# Patient Record
Sex: Male | Born: 1935 | Race: White | Hispanic: No | Marital: Married | Smoking: Former smoker
Health system: Southern US, Community
[De-identification: ages and names within clinical notes are randomized; demographics above are authoritative.]

## PROBLEM LIST (undated history)

## (undated) DIAGNOSIS — R269 Unspecified abnormalities of gait and mobility: Secondary | ICD-10-CM

## (undated) DIAGNOSIS — G8929 Other chronic pain: Secondary | ICD-10-CM

## (undated) DIAGNOSIS — N289 Disorder of kidney and ureter, unspecified: Secondary | ICD-10-CM

## (undated) DIAGNOSIS — C44310 Basal cell carcinoma of skin of unspecified parts of face: Secondary | ICD-10-CM

## (undated) DIAGNOSIS — J189 Pneumonia, unspecified organism: Secondary | ICD-10-CM

## (undated) DIAGNOSIS — R011 Cardiac murmur, unspecified: Secondary | ICD-10-CM

## (undated) DIAGNOSIS — I2510 Atherosclerotic heart disease of native coronary artery without angina pectoris: Secondary | ICD-10-CM

## (undated) DIAGNOSIS — K56609 Unspecified intestinal obstruction, unspecified as to partial versus complete obstruction: Secondary | ICD-10-CM

## (undated) DIAGNOSIS — J8410 Pulmonary fibrosis, unspecified: Secondary | ICD-10-CM

## (undated) DIAGNOSIS — I82409 Acute embolism and thrombosis of unspecified deep veins of unspecified lower extremity: Secondary | ICD-10-CM

## (undated) DIAGNOSIS — E119 Type 2 diabetes mellitus without complications: Secondary | ICD-10-CM

## (undated) DIAGNOSIS — I219 Acute myocardial infarction, unspecified: Secondary | ICD-10-CM

## (undated) DIAGNOSIS — R931 Abnormal findings on diagnostic imaging of heart and coronary circulation: Secondary | ICD-10-CM

## (undated) DIAGNOSIS — Z86718 Personal history of other venous thrombosis and embolism: Secondary | ICD-10-CM

## (undated) DIAGNOSIS — E7800 Pure hypercholesterolemia, unspecified: Secondary | ICD-10-CM

## (undated) DIAGNOSIS — I10 Essential (primary) hypertension: Secondary | ICD-10-CM

## (undated) DIAGNOSIS — I714 Abdominal aortic aneurysm, without rupture, unspecified: Secondary | ICD-10-CM

## (undated) DIAGNOSIS — I499 Cardiac arrhythmia, unspecified: Secondary | ICD-10-CM

## (undated) DIAGNOSIS — Q602 Renal agenesis, unspecified: Secondary | ICD-10-CM

## (undated) DIAGNOSIS — E785 Hyperlipidemia, unspecified: Secondary | ICD-10-CM

## (undated) DIAGNOSIS — G609 Hereditary and idiopathic neuropathy, unspecified: Secondary | ICD-10-CM

## (undated) DIAGNOSIS — D649 Anemia, unspecified: Secondary | ICD-10-CM

## (undated) DIAGNOSIS — M1990 Unspecified osteoarthritis, unspecified site: Secondary | ICD-10-CM

## (undated) DIAGNOSIS — Q605 Renal hypoplasia, unspecified: Secondary | ICD-10-CM

## (undated) DIAGNOSIS — I2699 Other pulmonary embolism without acute cor pulmonale: Secondary | ICD-10-CM

## (undated) DIAGNOSIS — M545 Low back pain, unspecified: Secondary | ICD-10-CM

## (undated) DIAGNOSIS — N189 Chronic kidney disease, unspecified: Secondary | ICD-10-CM

## (undated) DIAGNOSIS — E1140 Type 2 diabetes mellitus with diabetic neuropathy, unspecified: Secondary | ICD-10-CM

## (undated) DIAGNOSIS — M109 Gout, unspecified: Secondary | ICD-10-CM

## (undated) DIAGNOSIS — I7100 Dissection of unspecified site of aorta: Secondary | ICD-10-CM

## (undated) DIAGNOSIS — G4730 Sleep apnea, unspecified: Secondary | ICD-10-CM

## (undated) DIAGNOSIS — G4733 Obstructive sleep apnea (adult) (pediatric): Secondary | ICD-10-CM

## (undated) HISTORY — PX: TONSILLECTOMY: SUR1361

## (undated) HISTORY — PX: JOINT REPLACEMENT: SHX530

## (undated) HISTORY — DX: Personal history of other venous thrombosis and embolism: Z86.718

## (undated) HISTORY — PX: ABDOMINAL AORTIC ANEURYSM REPAIR: SHX42

## (undated) HISTORY — PX: COLONOSCOPY: SHX174

## (undated) HISTORY — DX: Atherosclerotic heart disease of native coronary artery without angina pectoris: I25.10

## (undated) HISTORY — PX: APPENDECTOMY: SHX54

## (undated) HISTORY — DX: Pure hypercholesterolemia, unspecified: E78.00

## (undated) HISTORY — DX: Cardiac arrhythmia, unspecified: I49.9

## (undated) HISTORY — DX: Pulmonary fibrosis, unspecified: J84.10

## (undated) HISTORY — DX: Abnormal findings on diagnostic imaging of heart and coronary circulation: R93.1

## (undated) HISTORY — DX: Gout, unspecified: M10.9

## (undated) HISTORY — DX: Unspecified osteoarthritis, unspecified site: M19.90

## (undated) HISTORY — DX: Essential (primary) hypertension: I10

## (undated) HISTORY — DX: Dissection of unspecified site of aorta: I71.00

## (undated) HISTORY — DX: Type 2 diabetes mellitus without complications: E11.9

## (undated) HISTORY — DX: Hereditary and idiopathic neuropathy, unspecified: G60.9

## (undated) HISTORY — DX: Obstructive sleep apnea (adult) (pediatric): G47.33

## (undated) HISTORY — DX: Abdominal aortic aneurysm, without rupture: I71.4

## (undated) HISTORY — DX: Unspecified abnormalities of gait and mobility: R26.9

## (undated) HISTORY — DX: Abdominal aortic aneurysm, without rupture, unspecified: I71.40

## (undated) HISTORY — DX: Renal hypoplasia, unspecified: Q60.5

## (undated) HISTORY — DX: Chronic kidney disease, unspecified: N18.9

## (undated) HISTORY — DX: Type 2 diabetes mellitus with diabetic neuropathy, unspecified: E11.40

## (undated) HISTORY — DX: Pneumonia, unspecified organism: J18.9

## (undated) HISTORY — DX: Renal agenesis, unspecified: Q60.2

## (undated) HISTORY — DX: Disorder of kidney and ureter, unspecified: N28.9

## (undated) HISTORY — PX: CATARACT EXTRACTION W/ INTRAOCULAR LENS  IMPLANT, BILATERAL: SHX1307

## (undated) HISTORY — DX: Hyperlipidemia, unspecified: E78.5

## (undated) HISTORY — DX: Sleep apnea, unspecified: G47.30

---
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
## 1983-03-07 HISTORY — PX: INGUINAL HERNIA REPAIR: SUR1180

## 1985-03-06 DIAGNOSIS — I219 Acute myocardial infarction, unspecified: Secondary | ICD-10-CM

## 1985-03-06 HISTORY — DX: Acute myocardial infarction, unspecified: I21.9

## 1985-03-06 HISTORY — PX: CARDIAC CATHETERIZATION: SHX172

## 1986-01-04 HISTORY — PX: CORONARY ARTERY BYPASS GRAFT: SHX141

## 1989-05-04 HISTORY — PX: TOTAL KNEE ARTHROPLASTY: SHX125

## 1994-03-06 HISTORY — PX: NEPHRECTOMY: SHX65

## 1997-07-21 ENCOUNTER — Other Ambulatory Visit: Admission: RE | Admit: 1997-07-21 | Discharge: 1997-07-21 | Payer: Self-pay | Admitting: *Deleted

## 1997-07-22 ENCOUNTER — Ambulatory Visit (HOSPITAL_COMMUNITY): Admission: RE | Admit: 1997-07-22 | Discharge: 1997-07-22 | Payer: Self-pay | Admitting: *Deleted

## 1998-08-19 ENCOUNTER — Ambulatory Visit (HOSPITAL_COMMUNITY): Admission: RE | Admit: 1998-08-19 | Discharge: 1998-08-19 | Payer: Self-pay | Admitting: *Deleted

## 1999-07-28 ENCOUNTER — Ambulatory Visit: Admission: RE | Admit: 1999-07-28 | Discharge: 1999-07-28 | Payer: Self-pay | Admitting: Internal Medicine

## 1999-07-28 ENCOUNTER — Encounter: Payer: Self-pay | Admitting: Internal Medicine

## 1999-08-26 ENCOUNTER — Encounter: Payer: Self-pay | Admitting: Internal Medicine

## 1999-08-26 ENCOUNTER — Ambulatory Visit (HOSPITAL_COMMUNITY): Admission: RE | Admit: 1999-08-26 | Discharge: 1999-08-26 | Payer: Self-pay | Admitting: Internal Medicine

## 2003-12-20 ENCOUNTER — Ambulatory Visit (HOSPITAL_BASED_OUTPATIENT_CLINIC_OR_DEPARTMENT_OTHER): Admission: RE | Admit: 2003-12-20 | Discharge: 2003-12-20 | Payer: Self-pay | Admitting: Internal Medicine

## 2004-01-08 ENCOUNTER — Encounter: Payer: Self-pay | Admitting: Internal Medicine

## 2004-01-08 ENCOUNTER — Ambulatory Visit (HOSPITAL_COMMUNITY): Admission: RE | Admit: 2004-01-08 | Discharge: 2004-01-08 | Payer: Self-pay | Admitting: Gastroenterology

## 2004-02-10 ENCOUNTER — Encounter: Admission: RE | Admit: 2004-02-10 | Discharge: 2004-02-10 | Payer: Self-pay | Admitting: Internal Medicine

## 2004-03-29 ENCOUNTER — Ambulatory Visit: Payer: Self-pay | Admitting: Internal Medicine

## 2004-04-28 ENCOUNTER — Ambulatory Visit (HOSPITAL_COMMUNITY): Admission: RE | Admit: 2004-04-28 | Discharge: 2004-04-28 | Payer: Self-pay | Admitting: Nephrology

## 2004-05-09 ENCOUNTER — Ambulatory Visit: Payer: Self-pay | Admitting: Internal Medicine

## 2004-09-01 ENCOUNTER — Ambulatory Visit: Payer: Self-pay | Admitting: Internal Medicine

## 2004-09-19 ENCOUNTER — Encounter: Payer: Self-pay | Admitting: Internal Medicine

## 2005-05-04 HISTORY — PX: OTHER SURGICAL HISTORY: SHX169

## 2005-05-05 ENCOUNTER — Encounter: Admission: RE | Admit: 2005-05-05 | Discharge: 2005-05-05 | Payer: Self-pay | Admitting: Orthopedic Surgery

## 2005-05-09 ENCOUNTER — Ambulatory Visit (HOSPITAL_BASED_OUTPATIENT_CLINIC_OR_DEPARTMENT_OTHER): Admission: RE | Admit: 2005-05-09 | Discharge: 2005-05-10 | Payer: Self-pay | Admitting: Orthopedic Surgery

## 2005-05-10 ENCOUNTER — Encounter (INDEPENDENT_AMBULATORY_CARE_PROVIDER_SITE_OTHER): Payer: Self-pay | Admitting: Specialist

## 2005-05-31 ENCOUNTER — Encounter: Admission: RE | Admit: 2005-05-31 | Discharge: 2005-05-31 | Payer: Self-pay | Admitting: Internal Medicine

## 2005-08-08 ENCOUNTER — Encounter: Admission: RE | Admit: 2005-08-08 | Discharge: 2005-08-08 | Payer: Self-pay | Admitting: Internal Medicine

## 2006-09-05 ENCOUNTER — Ambulatory Visit: Payer: Self-pay | Admitting: Internal Medicine

## 2006-09-05 DIAGNOSIS — Z9889 Other specified postprocedural states: Secondary | ICD-10-CM

## 2006-09-05 DIAGNOSIS — I2510 Atherosclerotic heart disease of native coronary artery without angina pectoris: Secondary | ICD-10-CM | POA: Insufficient documentation

## 2006-09-05 DIAGNOSIS — E119 Type 2 diabetes mellitus without complications: Secondary | ICD-10-CM

## 2006-09-05 DIAGNOSIS — G4730 Sleep apnea, unspecified: Secondary | ICD-10-CM

## 2006-09-05 DIAGNOSIS — N183 Chronic kidney disease, stage 3 unspecified: Secondary | ICD-10-CM | POA: Insufficient documentation

## 2006-09-05 DIAGNOSIS — I10 Essential (primary) hypertension: Secondary | ICD-10-CM

## 2006-09-05 DIAGNOSIS — Q605 Renal hypoplasia, unspecified: Secondary | ICD-10-CM

## 2006-09-05 DIAGNOSIS — I110 Hypertensive heart disease with heart failure: Secondary | ICD-10-CM

## 2006-09-05 DIAGNOSIS — G609 Hereditary and idiopathic neuropathy, unspecified: Secondary | ICD-10-CM

## 2006-09-05 DIAGNOSIS — N189 Chronic kidney disease, unspecified: Secondary | ICD-10-CM

## 2006-09-05 DIAGNOSIS — Q602 Renal agenesis, unspecified: Secondary | ICD-10-CM | POA: Insufficient documentation

## 2006-09-05 HISTORY — DX: Hereditary and idiopathic neuropathy, unspecified: G60.9

## 2006-09-05 HISTORY — DX: Essential (primary) hypertension: I10

## 2006-09-05 HISTORY — DX: Type 2 diabetes mellitus without complications: E11.9

## 2006-09-05 HISTORY — DX: Sleep apnea, unspecified: G47.30

## 2006-09-05 HISTORY — DX: Renal agenesis, unspecified: Q60.2

## 2006-09-05 HISTORY — DX: Chronic kidney disease, unspecified: N18.9

## 2006-09-13 ENCOUNTER — Ambulatory Visit: Payer: Self-pay | Admitting: Internal Medicine

## 2006-09-17 LAB — CONVERTED CEMR LAB
AST: 23 units/L (ref 0–37)
BUN: 19 mg/dL (ref 6–23)
Basophils Absolute: 0 10*3/uL (ref 0.0–0.1)
CO2: 29 meq/L (ref 19–32)
Calcium: 10 mg/dL (ref 8.4–10.5)
Chloride: 104 meq/L (ref 96–112)
Cholesterol: 146 mg/dL (ref 0–200)
Eosinophils Relative: 4.3 % (ref 0.0–5.0)
GFR calc non Af Amer: 52 mL/min
Glucose, Bld: 102 mg/dL — ABNORMAL HIGH (ref 70–99)
LDL Cholesterol: 85 mg/dL (ref 0–99)
MCHC: 35.3 g/dL (ref 30.0–36.0)
Microalb Creat Ratio: 4.9 mg/g (ref 0.0–30.0)
Neutro Abs: 4.4 10*3/uL (ref 1.4–7.7)
PSA: 1.04 ng/mL (ref 0.10–4.00)
Platelets: 201 10*3/uL (ref 150–400)
Potassium: 4 meq/L (ref 3.5–5.1)
Total CHOL/HDL Ratio: 3.8
Triglycerides: 111 mg/dL (ref 0–149)

## 2006-12-12 ENCOUNTER — Encounter: Payer: Self-pay | Admitting: Internal Medicine

## 2006-12-25 ENCOUNTER — Ambulatory Visit: Payer: Self-pay | Admitting: Internal Medicine

## 2007-01-03 DIAGNOSIS — G4733 Obstructive sleep apnea (adult) (pediatric): Secondary | ICD-10-CM | POA: Insufficient documentation

## 2007-01-03 LAB — CONVERTED CEMR LAB
BUN: 20 mg/dL (ref 6–23)
CO2: 31 meq/L (ref 19–32)
Calcium: 9.9 mg/dL (ref 8.4–10.5)
Chloride: 108 meq/L (ref 96–112)
GFR calc Af Amer: 76 mL/min
GFR calc non Af Amer: 62 mL/min
Glucose, Bld: 112 mg/dL — ABNORMAL HIGH (ref 70–99)
Sodium: 145 meq/L (ref 135–145)
TSH: 1.51 microintl units/mL (ref 0.35–5.50)

## 2007-01-09 ENCOUNTER — Ambulatory Visit: Payer: Self-pay

## 2007-01-09 ENCOUNTER — Encounter: Payer: Self-pay | Admitting: Internal Medicine

## 2007-01-14 ENCOUNTER — Encounter: Payer: Self-pay | Admitting: Internal Medicine

## 2007-01-14 HISTORY — PX: US ECHOCARDIOGRAPHY: HXRAD669

## 2007-03-26 ENCOUNTER — Telehealth (INDEPENDENT_AMBULATORY_CARE_PROVIDER_SITE_OTHER): Payer: Self-pay | Admitting: *Deleted

## 2007-04-23 ENCOUNTER — Ambulatory Visit: Payer: Self-pay | Admitting: Internal Medicine

## 2007-04-23 DIAGNOSIS — M1990 Unspecified osteoarthritis, unspecified site: Secondary | ICD-10-CM | POA: Insufficient documentation

## 2007-04-24 ENCOUNTER — Encounter (INDEPENDENT_AMBULATORY_CARE_PROVIDER_SITE_OTHER): Payer: Self-pay | Admitting: *Deleted

## 2007-04-24 LAB — CONVERTED CEMR LAB
GFR calc non Af Amer: 57 mL/min
Potassium: 4.3 meq/L (ref 3.5–5.1)

## 2007-04-25 ENCOUNTER — Encounter: Payer: Self-pay | Admitting: Internal Medicine

## 2007-04-29 ENCOUNTER — Encounter: Admission: RE | Admit: 2007-04-29 | Discharge: 2007-04-29 | Payer: Self-pay | Admitting: Orthopedic Surgery

## 2007-04-30 ENCOUNTER — Ambulatory Visit (HOSPITAL_BASED_OUTPATIENT_CLINIC_OR_DEPARTMENT_OTHER): Admission: RE | Admit: 2007-04-30 | Discharge: 2007-04-30 | Payer: Self-pay | Admitting: Orthopedic Surgery

## 2007-05-10 ENCOUNTER — Ambulatory Visit: Payer: Self-pay | Admitting: Internal Medicine

## 2007-05-10 ENCOUNTER — Encounter: Payer: Self-pay | Admitting: Internal Medicine

## 2007-06-30 ENCOUNTER — Encounter (INDEPENDENT_AMBULATORY_CARE_PROVIDER_SITE_OTHER): Payer: Self-pay | Admitting: *Deleted

## 2007-08-22 ENCOUNTER — Ambulatory Visit: Payer: Self-pay | Admitting: Internal Medicine

## 2007-08-22 DIAGNOSIS — M109 Gout, unspecified: Secondary | ICD-10-CM

## 2007-08-22 DIAGNOSIS — R269 Unspecified abnormalities of gait and mobility: Secondary | ICD-10-CM

## 2007-08-22 HISTORY — DX: Gout, unspecified: M10.9

## 2007-08-22 HISTORY — DX: Unspecified abnormalities of gait and mobility: R26.9

## 2007-08-28 LAB — CONVERTED CEMR LAB
ALT: 16 units/L (ref 0–53)
AST: 26 units/L (ref 0–37)
Albumin: 3.9 g/dL (ref 3.5–5.2)
Cholesterol: 150 mg/dL (ref 0–200)
Triglycerides: 196 mg/dL — ABNORMAL HIGH (ref 0–149)
VLDL: 39 mg/dL (ref 0–40)

## 2007-09-02 ENCOUNTER — Encounter: Admission: RE | Admit: 2007-09-02 | Discharge: 2007-09-02 | Payer: Self-pay | Admitting: Internal Medicine

## 2007-09-02 ENCOUNTER — Encounter: Payer: Self-pay | Admitting: Internal Medicine

## 2007-09-03 ENCOUNTER — Telehealth (INDEPENDENT_AMBULATORY_CARE_PROVIDER_SITE_OTHER): Payer: Self-pay | Admitting: *Deleted

## 2007-11-25 ENCOUNTER — Encounter: Payer: Self-pay | Admitting: Internal Medicine

## 2007-11-25 LAB — CONVERTED CEMR LAB
Chloride, Serum: 103 mmol/L
Creatinine, Ser: 1.2 mg/dL
Glucose, Bld: 97 mg/dL

## 2007-11-27 ENCOUNTER — Encounter: Payer: Self-pay | Admitting: Internal Medicine

## 2007-12-09 ENCOUNTER — Ambulatory Visit: Payer: Self-pay | Admitting: Internal Medicine

## 2007-12-11 ENCOUNTER — Encounter: Payer: Self-pay | Admitting: Internal Medicine

## 2007-12-11 ENCOUNTER — Encounter (INDEPENDENT_AMBULATORY_CARE_PROVIDER_SITE_OTHER): Payer: Self-pay | Admitting: *Deleted

## 2007-12-26 ENCOUNTER — Telehealth (INDEPENDENT_AMBULATORY_CARE_PROVIDER_SITE_OTHER): Payer: Self-pay | Admitting: *Deleted

## 2007-12-26 ENCOUNTER — Ambulatory Visit: Payer: Self-pay | Admitting: Internal Medicine

## 2007-12-26 LAB — CONVERTED CEMR LAB
ALT: 17 units/L
Albumin: 4.3 g/dL
Total Bilirubin: 0.5 mg/dL
Total Protein: 7.3 g/dL
Triglyceride fasting, serum: 274 mg/dL

## 2008-01-01 ENCOUNTER — Encounter (INDEPENDENT_AMBULATORY_CARE_PROVIDER_SITE_OTHER): Payer: Self-pay | Admitting: *Deleted

## 2008-01-01 LAB — CONVERTED CEMR LAB: Hgb A1c MFr Bld: 6.1 % — ABNORMAL HIGH (ref 4.6–6.0)

## 2008-01-14 ENCOUNTER — Ambulatory Visit: Payer: Self-pay

## 2008-01-14 ENCOUNTER — Encounter: Payer: Self-pay | Admitting: Internal Medicine

## 2008-01-22 ENCOUNTER — Telehealth (INDEPENDENT_AMBULATORY_CARE_PROVIDER_SITE_OTHER): Payer: Self-pay | Admitting: *Deleted

## 2008-03-06 LAB — HM COLONOSCOPY: HM COLON: NORMAL

## 2008-03-25 ENCOUNTER — Encounter: Payer: Self-pay | Admitting: Internal Medicine

## 2008-03-26 ENCOUNTER — Encounter: Payer: Self-pay | Admitting: Internal Medicine

## 2008-04-16 ENCOUNTER — Telehealth: Payer: Self-pay | Admitting: Internal Medicine

## 2008-04-16 ENCOUNTER — Ambulatory Visit: Payer: Self-pay | Admitting: Internal Medicine

## 2008-04-16 DIAGNOSIS — L989 Disorder of the skin and subcutaneous tissue, unspecified: Secondary | ICD-10-CM | POA: Insufficient documentation

## 2008-04-20 ENCOUNTER — Telehealth (INDEPENDENT_AMBULATORY_CARE_PROVIDER_SITE_OTHER): Payer: Self-pay | Admitting: *Deleted

## 2008-04-20 LAB — CONVERTED CEMR LAB: PSA: 2.35 ng/mL (ref 0.10–4.00)

## 2008-08-18 ENCOUNTER — Ambulatory Visit: Payer: Self-pay | Admitting: Internal Medicine

## 2008-08-26 LAB — CONVERTED CEMR LAB
CO2: 27 meq/L (ref 19–32)
Calcium: 9.7 mg/dL (ref 8.4–10.5)
Chloride: 100 meq/L (ref 96–112)
Creatinine, Ser: 1.4 mg/dL (ref 0.4–1.5)
Glucose, Bld: 94 mg/dL (ref 70–99)
Hemoglobin: 13.1 g/dL (ref 13.0–17.0)
Hgb A1c MFr Bld: 6.4 % (ref 4.6–6.5)
Lymphs Abs: 1.4 10*3/uL (ref 0.7–4.0)
MCHC: 34.2 g/dL (ref 30.0–36.0)
MCV: 98.5 fL (ref 78.0–100.0)
Monocytes Relative: 3 % (ref 3.0–12.0)
Platelets: 185 10*3/uL (ref 150.0–400.0)
Potassium: 4.6 meq/L (ref 3.5–5.1)
RBC: 3.88 M/uL — ABNORMAL LOW (ref 4.22–5.81)

## 2008-08-28 ENCOUNTER — Telehealth: Payer: Self-pay | Admitting: Internal Medicine

## 2008-08-31 ENCOUNTER — Ambulatory Visit: Payer: Self-pay | Admitting: Internal Medicine

## 2008-09-01 ENCOUNTER — Telehealth: Payer: Self-pay | Admitting: Internal Medicine

## 2008-09-29 ENCOUNTER — Encounter: Payer: Self-pay | Admitting: Internal Medicine

## 2008-09-30 ENCOUNTER — Telehealth: Payer: Self-pay | Admitting: Internal Medicine

## 2008-10-05 ENCOUNTER — Ambulatory Visit: Payer: Self-pay | Admitting: Internal Medicine

## 2008-10-07 ENCOUNTER — Telehealth: Payer: Self-pay | Admitting: Internal Medicine

## 2008-10-12 ENCOUNTER — Telehealth: Payer: Self-pay | Admitting: Internal Medicine

## 2008-10-16 ENCOUNTER — Telehealth (INDEPENDENT_AMBULATORY_CARE_PROVIDER_SITE_OTHER): Payer: Self-pay | Admitting: *Deleted

## 2008-10-17 ENCOUNTER — Ambulatory Visit: Payer: Self-pay | Admitting: Diagnostic Radiology

## 2008-10-17 ENCOUNTER — Emergency Department (HOSPITAL_BASED_OUTPATIENT_CLINIC_OR_DEPARTMENT_OTHER): Admission: EM | Admit: 2008-10-17 | Discharge: 2008-10-17 | Payer: Self-pay | Admitting: Emergency Medicine

## 2008-10-18 ENCOUNTER — Ambulatory Visit: Payer: Self-pay | Admitting: Internal Medicine

## 2008-10-18 ENCOUNTER — Inpatient Hospital Stay (HOSPITAL_COMMUNITY): Admission: EM | Admit: 2008-10-18 | Discharge: 2008-10-23 | Payer: Self-pay | Admitting: Emergency Medicine

## 2008-10-18 ENCOUNTER — Encounter: Payer: Self-pay | Admitting: Internal Medicine

## 2008-10-19 ENCOUNTER — Ambulatory Visit: Payer: Self-pay | Admitting: Infectious Diseases

## 2008-10-19 ENCOUNTER — Encounter: Payer: Self-pay | Admitting: Internal Medicine

## 2008-10-23 ENCOUNTER — Telehealth (INDEPENDENT_AMBULATORY_CARE_PROVIDER_SITE_OTHER): Payer: Self-pay | Admitting: *Deleted

## 2008-10-23 ENCOUNTER — Encounter: Payer: Self-pay | Admitting: Internal Medicine

## 2008-10-26 ENCOUNTER — Telehealth: Payer: Self-pay | Admitting: Internal Medicine

## 2008-10-27 ENCOUNTER — Encounter: Payer: Self-pay | Admitting: Internal Medicine

## 2008-11-02 ENCOUNTER — Encounter: Payer: Self-pay | Admitting: Internal Medicine

## 2008-11-02 ENCOUNTER — Telehealth (INDEPENDENT_AMBULATORY_CARE_PROVIDER_SITE_OTHER): Payer: Self-pay | Admitting: *Deleted

## 2008-11-04 ENCOUNTER — Telehealth (INDEPENDENT_AMBULATORY_CARE_PROVIDER_SITE_OTHER): Payer: Self-pay | Admitting: *Deleted

## 2008-11-11 ENCOUNTER — Encounter (INDEPENDENT_AMBULATORY_CARE_PROVIDER_SITE_OTHER): Payer: Self-pay | Admitting: *Deleted

## 2008-11-13 ENCOUNTER — Ambulatory Visit: Payer: Self-pay | Admitting: Internal Medicine

## 2008-11-17 ENCOUNTER — Telehealth (INDEPENDENT_AMBULATORY_CARE_PROVIDER_SITE_OTHER): Payer: Self-pay | Admitting: *Deleted

## 2008-11-26 ENCOUNTER — Encounter: Payer: Self-pay | Admitting: Internal Medicine

## 2008-12-15 ENCOUNTER — Telehealth (INDEPENDENT_AMBULATORY_CARE_PROVIDER_SITE_OTHER): Payer: Self-pay | Admitting: *Deleted

## 2008-12-21 ENCOUNTER — Ambulatory Visit: Payer: Self-pay | Admitting: Internal Medicine

## 2008-12-25 LAB — CONVERTED CEMR LAB
AST: 24 units/L (ref 0–37)
BUN: 30 mg/dL — ABNORMAL HIGH (ref 6–23)
CO2: 30 meq/L (ref 19–32)
Calcium: 9.4 mg/dL (ref 8.4–10.5)
Creatinine, Ser: 1.3 mg/dL (ref 0.4–1.5)
Potassium: 3.7 meq/L (ref 3.5–5.1)

## 2009-02-22 ENCOUNTER — Encounter: Payer: Self-pay | Admitting: Internal Medicine

## 2009-02-22 LAB — CONVERTED CEMR LAB
ALT: 19 units/L
Albumin: 4.2 g/dL
Alkaline Phosphatase: 74 units/L
BUN: 25 mg/dL
Calcium: 10.4 mg/dL
Chloride, Serum: 101 mmol/L
Chloride, Serum: 104 mmol/L
Creatinine, Ser: 1.33 mg/dL
Creatinine, Ser: 1.4 mg/dL
Glucose, Bld: 113 mg/dL
HDL: 39 mg/dL
RBC count: 4.09 10*6/uL
Sodium, serum: 141 mmol/L
Total Protein: 7.3 g/dL
WBC, blood: 11.4 10*3/uL
platelet count: 193 10*3/uL

## 2009-03-23 ENCOUNTER — Ambulatory Visit: Payer: Self-pay | Admitting: Internal Medicine

## 2009-03-23 DIAGNOSIS — E785 Hyperlipidemia, unspecified: Secondary | ICD-10-CM

## 2009-03-23 DIAGNOSIS — E7800 Pure hypercholesterolemia, unspecified: Secondary | ICD-10-CM

## 2009-03-23 HISTORY — DX: Pure hypercholesterolemia, unspecified: E78.00

## 2009-03-23 LAB — HM DIABETES FOOT EXAM

## 2009-03-28 LAB — CONVERTED CEMR LAB
Creatinine,U: 134.7 mg/dL
Hgb A1c MFr Bld: 6.5 % (ref 4.6–6.5)
Microalb Creat Ratio: 12.6 mg/g (ref 0.0–30.0)

## 2009-04-09 ENCOUNTER — Encounter: Payer: Self-pay | Admitting: Internal Medicine

## 2009-04-14 ENCOUNTER — Encounter: Payer: Self-pay | Admitting: Internal Medicine

## 2009-05-24 ENCOUNTER — Encounter: Payer: Self-pay | Admitting: Internal Medicine

## 2009-06-22 ENCOUNTER — Ambulatory Visit: Payer: Self-pay | Admitting: Internal Medicine

## 2009-07-05 ENCOUNTER — Ambulatory Visit: Payer: Self-pay | Admitting: Family Medicine

## 2009-07-12 ENCOUNTER — Telehealth: Payer: Self-pay | Admitting: Family Medicine

## 2009-08-05 ENCOUNTER — Ambulatory Visit: Payer: Self-pay | Admitting: Family Medicine

## 2009-08-05 ENCOUNTER — Encounter: Payer: Self-pay | Admitting: Internal Medicine

## 2009-08-05 ENCOUNTER — Ambulatory Visit: Payer: Self-pay | Admitting: Internal Medicine

## 2009-08-05 LAB — CONVERTED CEMR LAB
Albumin: 3.6 g/dL (ref 3.5–5.2)
Basophils Relative: 0.5 % (ref 0.0–3.0)
Bilirubin, Direct: 0.1 mg/dL (ref 0.0–0.3)
Creatinine, Ser: 1.2 mg/dL (ref 0.4–1.5)
Eosinophils Absolute: 0.6 10*3/uL (ref 0.0–0.7)
GFR calc non Af Amer: 63.08 mL/min (ref >60)
Glucose, Bld: 103 mg/dL — ABNORMAL HIGH (ref 70–99)
Hgb A1c MFr Bld: 6.2 % (ref 4.6–6.5)
Lymphocytes Relative: 24.6 % (ref 12.0–46.0)
MCHC: 34.3 g/dL (ref 30.0–36.0)
Monocytes Relative: 10.1 % (ref 3.0–12.0)
Neutro Abs: 4.5 10*3/uL (ref 1.4–7.7)
RBC: 3.72 M/uL — ABNORMAL LOW (ref 4.22–5.81)
Sodium: 143 meq/L (ref 135–145)
TSH: 1.34 microintl units/mL (ref 0.35–5.50)
WBC: 7.9 10*3/uL (ref 4.5–10.5)

## 2009-08-06 ENCOUNTER — Telehealth (INDEPENDENT_AMBULATORY_CARE_PROVIDER_SITE_OTHER): Payer: Self-pay | Admitting: *Deleted

## 2009-08-10 ENCOUNTER — Ambulatory Visit: Payer: Self-pay | Admitting: Internal Medicine

## 2009-10-06 ENCOUNTER — Ambulatory Visit: Payer: Self-pay | Admitting: Internal Medicine

## 2009-10-07 ENCOUNTER — Encounter: Payer: Self-pay | Admitting: Internal Medicine

## 2009-10-07 ENCOUNTER — Ambulatory Visit: Payer: Self-pay | Admitting: Cardiology

## 2009-10-12 ENCOUNTER — Telehealth (INDEPENDENT_AMBULATORY_CARE_PROVIDER_SITE_OTHER): Payer: Self-pay | Admitting: *Deleted

## 2009-10-13 ENCOUNTER — Ambulatory Visit: Payer: Self-pay

## 2009-10-13 ENCOUNTER — Ambulatory Visit: Payer: Self-pay | Admitting: Cardiology

## 2009-10-13 ENCOUNTER — Encounter (HOSPITAL_COMMUNITY): Admission: RE | Admit: 2009-10-13 | Discharge: 2009-11-26 | Payer: Self-pay | Admitting: Cardiology

## 2009-10-13 ENCOUNTER — Encounter: Payer: Self-pay | Admitting: Cardiology

## 2009-10-13 HISTORY — PX: CARDIOVASCULAR STRESS TEST: SHX262

## 2009-11-09 ENCOUNTER — Ambulatory Visit: Payer: Self-pay | Admitting: Internal Medicine

## 2009-11-15 ENCOUNTER — Telehealth (INDEPENDENT_AMBULATORY_CARE_PROVIDER_SITE_OTHER): Payer: Self-pay | Admitting: *Deleted

## 2009-11-17 LAB — CONVERTED CEMR LAB
BUN: 37 mg/dL — ABNORMAL HIGH (ref 6–23)
Basophils Absolute: 0 10*3/uL (ref 0.0–0.1)
Basophils Relative: 0.5 % (ref 0.0–3.0)
Chloride: 103 meq/L (ref 96–112)
Creatinine, Ser: 1.4 mg/dL (ref 0.4–1.5)
Eosinophils Absolute: 0.7 10*3/uL (ref 0.0–0.7)
GFR calc non Af Amer: 51.75 mL/min (ref >60)
HCT: 33.5 % — ABNORMAL LOW (ref 39.0–52.0)
Hemoglobin: 11.7 g/dL — ABNORMAL LOW (ref 13.0–17.0)
Lymphocytes Relative: 25.2 % (ref 12.0–46.0)
Lymphs Abs: 1.9 10*3/uL (ref 0.7–4.0)
MCHC: 34.9 g/dL (ref 30.0–36.0)
Monocytes Absolute: 0.9 10*3/uL (ref 0.1–1.0)
Monocytes Relative: 12.2 % — ABNORMAL HIGH (ref 3.0–12.0)
Neutro Abs: 4 10*3/uL (ref 1.4–7.7)
Neutrophils Relative %: 53.2 % (ref 43.0–77.0)
Platelets: 185 10*3/uL (ref 150.0–400.0)
Potassium: 4.7 meq/L (ref 3.5–5.1)
Sodium: 140 meq/L (ref 135–145)

## 2009-11-19 ENCOUNTER — Encounter: Payer: Self-pay | Admitting: Internal Medicine

## 2009-11-23 ENCOUNTER — Encounter: Payer: Self-pay | Admitting: Internal Medicine

## 2009-11-24 ENCOUNTER — Ambulatory Visit: Payer: Self-pay | Admitting: Internal Medicine

## 2009-11-29 LAB — CONVERTED CEMR LAB
Iron: 70 ug/dL (ref 42–165)
Vitamin B-12: 918 pg/mL — ABNORMAL HIGH (ref 211–911)

## 2009-12-15 ENCOUNTER — Telehealth: Payer: Self-pay | Admitting: Internal Medicine

## 2010-01-19 ENCOUNTER — Encounter: Payer: Self-pay | Admitting: Internal Medicine

## 2010-01-20 ENCOUNTER — Encounter: Payer: Self-pay | Admitting: Internal Medicine

## 2010-01-20 ENCOUNTER — Ambulatory Visit: Payer: Self-pay

## 2010-02-01 ENCOUNTER — Telehealth: Payer: Self-pay | Admitting: Internal Medicine

## 2010-03-14 ENCOUNTER — Other Ambulatory Visit: Payer: Self-pay | Admitting: Internal Medicine

## 2010-03-14 ENCOUNTER — Ambulatory Visit
Admission: RE | Admit: 2010-03-14 | Discharge: 2010-03-14 | Payer: Self-pay | Source: Home / Self Care | Attending: Internal Medicine | Admitting: Internal Medicine

## 2010-03-14 LAB — HEMOGLOBIN A1C: Hgb A1c MFr Bld: 6 % (ref 4.6–6.5)

## 2010-03-14 LAB — PSA: PSA: 2.42 ng/mL (ref 0.10–4.00)

## 2010-03-14 LAB — HEMOGLOBIN: Hemoglobin: 12.4 g/dL — ABNORMAL LOW (ref 13.0–17.0)

## 2010-04-07 NOTE — Assessment & Plan Note (Signed)
Summary: 3 mth fu/ns/kdc   Vital Signs:  Patient profile:   77 year old male Height:      68 inches Weight:      185 pounds BMI:     28.23 Pulse rate:   60 / minute BP sitting:   112 / 64  Vitals Entered By: Shary Decamp (June 22, 2009 9:31 AM) CC: rov - fasting Comments off prednisone x 2 weeks, c/o of pain in knees, feet Shary Decamp  June 22, 2009 9:31 AM    History of Present Illness: rov - fasting    Current Medications (verified): 1)  Furosemide 40 Mg  Tabs (Furosemide) .Marland Kitchen.. 1 By Mouth Qd 2)  Atenolol 25 Mg  Tabs (Atenolol) .Marland Kitchen.. 1 By Mouth Once Daily 3)  Niacin 500 Mg  Tabs (Niacin) .Marland Kitchen.. 1 By Mouth Qhs 4)  Simvastatin 80 Mg  Tabs (Simvastatin) .Marland Kitchen.. 1 By Mouth At Bedtime 5)  Gabapentin 400 Mg  Caps (Gabapentin) .... 3 Qam; 2 Qpm 6)  Tylenol Arthritis Pain   Tbcr (Acetaminophen Tbcr) .... Prn 7)  Centrum Silver   Tabs (Multiple Vitamins-Minerals) 8)  Flaxseed Oil   Caps (Flaxseed (Linseed) Caps) 9)  Glucosamine-Chondroitin   Caps (Glucosamine-Chondroitin Caps) 10)  Adult Aspirin Low Strength 81 Mg  Tbdp (Aspirin) 11)  B12 12)  Fish Oil 1000 Mg Caps (Omega-3 Fatty Acids) .... 2 By Mouth Tid 13)  Endocet 5-325 Mg Tabs (Oxycodone-Acetaminophen) .Marland Kitchen.. 1 By Mouth Every 6 Hours 14)  Uloric 40 Mg Tabs (Febuxostat) .... Daily  Allergies (verified): No Known Drug Allergies  Past History:  Past Medical History: Reviewed history from 03/23/2009 and no changes required. CAD--Dr Tennant--MI 08-04-85--CABG '87; low risk stress test  9-09 ECHO 11-08--NL fx, decreased LV relaxation & Dr Deborah Chalk office Abd.u/s  6-09 (-) for AAA Carotid u/s 11-09 0-39%, repeat 2  year ---------------------------------------------------------------------------------------------------------------------- hyperlipidemia DM w/ peripheral neuropathy HTN OSA, Dx 2006; uses CPAP soemtimes, limited tolerance RENAL INSUFFICIENCY, CHRONIC w/ SOLITARY KIDNEY, CONGENITAL   DEPRESSION Osteoarthritis Gout    gout  diskitis 10/2008, Dr Dareen Piano  gait d/o  (chronic imbalance)  Past Surgical History: Reviewed history from 09/05/2006 and no changes required. Inguinal herniorrhaphy (03/07/1983) Coronary artery bypass graft (01/04/1986) Total knee replacement (05/04/1989) -left Cataract extraction (09/1999) rt Cataract extraction (04/2003) -left knuckles replaced in left hand - 05/2005  Social History: Reviewed history from 09/05/2006 and no changes required. Married two children retired  Review of Systems       off prednisone x 2 weeks, since then, he feels more achy pain  is mostly in the shoulders, back, knees denies actual myalgias, fever or headache he also has psoriasis, f/u by  Dr. Terri Piedra, slightly worse symptoms lately?  Physical Exam  General:  alert and well-developed.   Lungs:  normal respiratory effort, no intercostal retractions, no accessory muscle use, and normal breath sounds.   Heart:  normal rate, regular rhythm, and no murmur.   Extremities:  no edema wrists slightly puffy, no warm or  red knuckles slightly swollen, mostly second and third bilaterally.  No warm or red   Impression & Recommendations:  Problem # 1:  GOUT (ICD-274.9) patient has a history of gout, now off prednisone for two weeks he is having more arthralgias but  no myalgias, fever or headaches is likely that prednisone was  helping his OA pain. Wrist and hand exam raise the question of an inflammatory arthritis as well. He is currently taking endocet once daily Plan: Increase Endocet to t.i.d.  stay away from prednisone for now if symptoms increase, he will let me know. Restart steroids? pros and cons of long-term steroids will have to be weighed.  I will have to discuss that with Dr. Dareen Piano  His updated medication list for this problem includes:    Uloric 40 Mg Tabs (Febuxostat) .Marland Kitchen... Daily  Problem # 2:  OSTEOARTHRITIS (ICD-715.90) see #1 His updated medication list for this problem  includes:    Tylenol Arthritis Pain Tbcr (Acetaminophen tbcr) .Marland Kitchen... Prn    Adult Aspirin Low Strength 81 Mg Tbdp (Aspirin)    Endocet 5-325 Mg Tabs (Oxycodone-acetaminophen) .Marland Kitchen... 1 by mouth every 6 hours  Problem # 3:  DIABETES MELLITUS, TYPE II (ICD-250.00) well controlled per last hemoglobin A1c, now off   steroids off lantus, ambulatory CBGs 130s in AM The following medications were removed from the medication list:    Lantus 100 Unit/ml Soln (Insulin glargine) .Marland KitchenMarland KitchenMarland KitchenMarland Kitchen 5 units at bedtime His updated medication list for this problem includes:    Adult Aspirin Low Strength 81 Mg Tbdp (Aspirin)  Labs Reviewed: Creat: 1.4 (02/22/2009)    Reviewed HgBA1c results: 6.5 (03/23/2009)  6.3 (12/21/2008)  Problem # 4:  HYPERTENSION (ICD-401.9) at goal  His updated medication list for this problem includes:    Furosemide 40 Mg Tabs (Furosemide) .Marland Kitchen... 1 by mouth qd    Atenolol 25 Mg Tabs (Atenolol) .Marland Kitchen... 1 by mouth once daily  BP today: 112/64 Prior BP: 110/70 (03/23/2009)  Labs Reviewed: K+: 4.4 (02/22/2009) Creat: : 1.4 (02/22/2009)   Chol: 168 (02/22/2009)   HDL: 39 (02/22/2009)   LDL: 86 (02/22/2009)   TG: 215 (02/22/2009)  Complete Medication List: 1)  Furosemide 40 Mg Tabs (Furosemide) .Marland Kitchen.. 1 by mouth qd 2)  Atenolol 25 Mg Tabs (Atenolol) .Marland Kitchen.. 1 by mouth once daily 3)  Niacin 500 Mg Tabs (Niacin) .Marland Kitchen.. 1 by mouth qhs 4)  Simvastatin 80 Mg Tabs (Simvastatin) .Marland Kitchen.. 1 by mouth at bedtime 5)  Gabapentin 400 Mg Caps (Gabapentin) .... 3 qam; 2 qpm 6)  Tylenol Arthritis Pain Tbcr (Acetaminophen tbcr) .... Prn 7)  Centrum Silver Tabs (Multiple vitamins-minerals) 8)  Flaxseed Oil Caps (Flaxseed (linseed) caps) 9)  Glucosamine-chondroitin Caps (Glucosamine-chondroitin caps) 10)  Adult Aspirin Low Strength 81 Mg Tbdp (Aspirin) 11)  B12  12)  Fish Oil 1000 Mg Caps (Omega-3 fatty acids) .... 2 by mouth tid 13)  Endocet 5-325 Mg Tabs (Oxycodone-acetaminophen) .Marland Kitchen.. 1 by mouth every 6 hours 14)   Uloric 40 Mg Tabs (Febuxostat) .... Daily  Patient Instructions: 1)  take Endocet 3 or even 4 times a day as needed for pain 2)  watch for drowsiness 3)  Please schedule a follow-up appointment in 3 months .

## 2010-04-07 NOTE — Progress Notes (Signed)
Summary: lab request/ specimen to old/ APPT  Phone Note From Other Clinic   Caller: [facility] Summary of Call: [facility] called and states the labs that were requested the specimen was too old. Will call pt and ask them to come back in and redraw. Do you remember which labs you wanted to add on? Army Fossa CMA  November 15, 2009 2:31 PM   Follow-up for Phone Call        iron, ferritin, B12 DX anemia- 285.9 Follow-up by: Nolon Rod. Paz MD,  November 15, 2009 4:10 PM  Additional Follow-up for Phone Call Additional follow up Details #1::        Please call and schedule appt for pt.  Additional Follow-up by: Army Fossa CMA,  November 15, 2009 4:12 PM    Additional Follow-up for Phone Call Additional follow up Details #2::    PATIENT HAS APPT ON 9/21 FOR REPEAT LABS AT 10:40 Follow-up by: Jerolyn Shin,  November 23, 2009 4:09 PM

## 2010-04-07 NOTE — Letter (Signed)
Summary: no retinopathy---[facility]  [facility]   Imported By: Lanelle Bal 11/30/2009 13:50:11  _____________________________________________________________________  External Attachment:    Type:   Image     Comment:   External Document  Appended Document: no retinopathy---[facility] eneter in EMR  Appended Document: no retinopathy---[facility]    Clinical Lists Changes  Observations: Added new observation of EYE EXAM: no retinopathy (12/06/2009 9:20)

## 2010-04-07 NOTE — Letter (Signed)
Summary: doing well, ordering a stress test--- [facility]   Imported By: Lanelle Bal 10/21/2009 11:35:35  _____________________________________________________________________  External Attachment:    Type:   Image     Comment:   External Document

## 2010-04-07 NOTE — Miscellaneous (Signed)
Summary: labs from Dr. Dareen Piano  Clinical Lists Changes  Observations: Changed observation from [facility] TOT: 215 mg/dL (69/62/9528 41:32) to [facility] TOT: 215 mg/dL (44/03/270 53:66) Changed observation from LDL: 86 mg/dL (44/05/4740 59:56) to LDL: 86 mg/dL (38/75/6433 29:51) Changed observation from HDL: 39 mg/dL (88/41/6606 30:16) to HDL: 39 mg/dL (03/14/3233 57:32) Changed observation from CHOLESTEROL: 168 mg/dL (20/25/4270 62:37) to CHOLESTEROL: 168 mg/dL (62/83/1517 61:60) Changed observation from BILI TOTAL: 0.5 mg/dL (73/71/0626 94:85) to BILI TOTAL: 0.5 mg/dL (46/27/0350 09:38) Changed observation from ALK PHOS: 74 units/L (03/26/2008 11:38) to ALK PHOS: 74 units/L (02/22/2009 11:38) Changed observation from SGPT (ALT): 29 units/L (03/26/2008 11:38) to SGPT (ALT): 29 units/L (02/22/2009 11:38) Changed observation from SGOT (AST): 28 units/L (03/26/2008 11:38) to SGOT (AST): 28 units/L (02/22/2009 11:38) Changed observation from PROTEIN, TOT: 7.3 g/dL (18/29/9371 69:67) to PROTEIN, TOT: 7.3 g/dL (89/38/1017 51:02) Changed observation from ALBUMIN: 4.2 g/dL (58/52/7782 42:35) to ALBUMIN: 4.2 g/dL (36/14/4315 40:08) Changed observation from CALCIUM: 10.2 mg/dL (67/61/9509 32:67) to CALCIUM: 10.2 mg/dL (12/45/8099 83:38) Changed observation from GLUCOSE SER: 113 mg/dL (25/07/3974 73:41) to GLUCOSE SER: 113 mg/dL (93/79/0240 97:35) Changed observation from CREATININE: 1.4 mg/dL (32/99/2426 83:41) to CREATININE: 1.4 mg/dL (96/22/2979 89:21) Changed observation from BUN: 25 mg/dL (19/41/7408 14:48) to BUN: 25 mg/dL (18/56/3149 70:26) Changed observation from CO2 TOTAL: 28 mmol/L (03/26/2008 11:38) to CO2 TOTAL: 28 mmol/L (02/22/2009 11:38) Changed observation from CHLORIDE: 104 mmol/L (03/26/2008 11:38) to CHLORIDE: 104 mmol/L (02/22/2009 11:38) Changed observation from POTASSIUM: 4.4 mmol/L (03/26/2008 11:38) to POTASSIUM: 4.4 mmol/L (02/22/2009 11:38) Changed observation from SODIUM: 142  mmol/L (03/26/2008 11:38) to SODIUM: 142 mmol/L (02/22/2009 11:38) Added new observation of URIC ACID: 6.0 mg/dL (37/85/8850 27:74) Added new observation of PROTEIN, TOT: 7.3 g/dL (12/87/8676 72:09) Added new observation of ALBUMIN: 4.6 g/dL (47/11/6281 66:29) Added new observation of ALK PHOS: 64 units/L (02/22/2009 11:11) Added new observation of SGPT (ALT): 19 units/L (02/22/2009 11:11) Added new observation of SGOT (AST): 27 units/L (02/22/2009 11:11) Added new observation of CALCIUM: 10.4 mg/dL (47/65/4650 35:46) Added new observation of BG RANDOM: 124 mg/dL (56/81/2751 70:01) Added new observation of CREATININE: 1.33 mg/dL (74/94/4967 59:16) Added new observation of BUN: 27 mg/dL (38/46/6599 35:70) Added new observation of CO2 TOTAL: 24 mmol/L (02/22/2009 11:11) Added new observation of CHLORIDE: 101 mmol/L (02/22/2009 11:11) Added new observation of POTASSIUM: 4.7 mmol/L (02/22/2009 11:11) Added new observation of SODIUM: 141 mmol/L (02/22/2009 11:11) Added new observation of PLATELET CNT: 193 10*3/microliter (02/22/2009 11:10) Added new observation of HCT: 39.8 % (02/22/2009 11:10) Added new observation of HGB: 13.8 g/dL (17/79/3903 00:92) Added new observation of RBC: 4.09 10*6/mm3 (02/22/2009 11:10) Added new observation of WBC: 11.4 10*3/mm3 (02/22/2009 11:10)      Complete Blood Count Test Date: 02/22/2009             Value   Units      H/L    Reference  WBC:       11.4  X 10^3/uL     H    (3.5-10.0) RBC:       4.09  X 10^6/uL     L    (4.20-5.80) Hgb:       13.8  g/dl               (33.0-07.6) Hct:       39.8  %                  (38.5-52.0) Platelets: 193   X 10^3/uL          (  150-450)  Test performed by:  Lurena Nida, MD    Chemistry Labs Test Date: 02/22/2009                      Value Units        H/L   Reference  Sodium:             141   mmol/L             (137-145) Potassium:          4.7   mmol/L             (3.6-5.0) Chloride:           101    mmol/L             (101-111) CO2:                24    mmol/L             (22-31) BUN:                27    mg/dL         H    (0-45) Creatinine:         1.33  mg/dL              (4.0-9.8) Glucose-random:     124   mg/dL         H    (11-914) Calcium (total):    10.4  mg/dL              (7-82.9) SGOT:               27    U/L                (10-40) SGPT:               19    U/L                (10-40) Alkaline P'tase:    64    U/L                (10-120) Albumin:            4.6   g/dL               (3-5) Total Protein:      7.3   g/dL          H    (4-7)  Test ordered by:    Lurena Nida, MD    Lab Entry Test Date: 02/22/2009                        Value        Units        H/L   Reference  Uric Acid:            6.0          mg/dl              (5.6-2.1)  Test ordered by:      Lurena Nida, MD

## 2010-04-07 NOTE — Assessment & Plan Note (Signed)
Summary: follow-up on blood work//lch   Vital Signs:  Patient profile:   77 year old male Height:      68 inches Weight:      176 pounds Temp:     98.6 degrees F oral Pulse rate:   66 / minute BP sitting:   122 / 62  (left arm)  Vitals Entered By: Jeremy Johann CMA (August 10, 2009 3:57 PM) CC: f/u lab  Comments REVIEWED MED LIST, PATIENT AGREED DOSE AND INSTRUCTION CORRECT    History of Present Illness: was seen 08-05-09 with a two-week history of fatigue, nausea, anorexia and weight loss. he also had lower extremity edema and a question of low-grade fever at night.  Labs showed a hemoglobin A1c of 6.2, normal LFTs, hemoglobin slightly lower than usual, normal sedimentation rate (48) , normal TSH, BNP is slightly elevated to 190 with no evidence of CHF on exam.   ROS Today he reports that he is feeling better, appetite has increased, no nausea. He also denies any diarrhea or blood in the stools, no abdominal pain. Denies headaches he was also stressed about his sister health, unfortunately she died last week. despite his loss, he is  handling his emotions  well  Allergies (verified): No Known Drug Allergies  Past History:  Past Medical History: Reviewed history from 03/23/2009 and no changes required. CAD--Dr Tennant--MI 08-04[birth year]85--CABG '87; low risk stress test  9-09 ECHO 11-08--NL fx, decreased LV relaxation & Dr Deborah Chalk office Abd.u/s  6-09 (-) for AAA Carotid u/s 11-09 0-39%, repeat 2  year ---------------------------------------------------------------------------------------------------------------------- hyperlipidemia DM w/ peripheral neuropathy HTN OSA, Dx 2006; uses CPAP soemtimes, limited tolerance RENAL INSUFFICIENCY, CHRONIC w/ SOLITARY KIDNEY, CONGENITAL   DEPRESSION Osteoarthritis Gout   gout  diskitis 10/2008, Dr Dareen Piano  gait d/o  (chronic imbalance)  Past Surgical History: Reviewed history from 09/05/2006 and no changes required. Inguinal  herniorrhaphy (03/07/1983) Coronary artery bypass graft (01/04/1986) Total knee replacement (05/04/1989) -left Cataract extraction (09/1999) rt Cataract extraction (04/2003) -left knuckles replaced in left hand - 05/2005  Social History: Reviewed history from 09/05/2006 and no changes required. Married two children retired  Review of Systems      See HPI  Physical Exam  General:  alert and well-developed.   Lungs:  normal respiratory effort, no intercostal retractions, no accessory muscle use, and normal breath sounds.   Heart:  normal rate, regular rhythm, and no murmur.   Abdomen:  soft, non-tender, and no distention.   Extremities:  no edema   Impression & Recommendations:  Problem # 1:  FATIGUE (ICD-780.79) was recently seen in weight fatigue, nausea, anorexia. Symptoms may have been related to a stress ( sister was sick) versus a viral illness. All his labs  were reviewed with the patient and his wife He has a mild drop of his hemoglobin and the BNP was elevated.  plan: Monitor his symptoms, he will come back sooner than 3 months if something change Recheck a CBC on  return to the office ECHO? : he will discuss with cardiology  Complete Medication List: 1)  Furosemide 40 Mg Tabs (Furosemide) .Marland Kitchen.. 1 by mouth qd 2)  Atenolol 25 Mg Tabs (Atenolol) .Marland Kitchen.. 1 by mouth once daily 3)  Niacin 500 Mg Tabs (Niacin) .Marland Kitchen.. 1 by mouth qhs 4)  Simvastatin 80 Mg Tabs (Simvastatin) .Marland Kitchen.. 1 by mouth at bedtime 5)  Gabapentin 400 Mg Caps (Gabapentin) .... 3 qam; 2 qpm 6)  Tylenol Arthritis Pain Tbcr (Acetaminophen tbcr) .... Prn 7)  Centrum Silver Tabs (Multiple vitamins-minerals) 8)  Flaxseed Oil Caps (Flaxseed (linseed) caps) 9)  Glucosamine-chondroitin Caps (Glucosamine-chondroitin caps) 10)  Adult Aspirin Low Strength 81 Mg Tbdp (Aspirin) 11)  B12  12)  Fish Oil 1000 Mg Caps (Omega-3 fatty acids) .... 2 by mouth tid 13)  Endocet 5-325 Mg Tabs (Oxycodone-acetaminophen) .Marland Kitchen.. 1 by mouth  every 6 hours 14)  Uloric 40 Mg Tabs (Febuxostat) .... Daily 15)  Zofran 4 Mg Tabs (Ondansetron hcl) .Marland Kitchen.. 1 by mouth every 8 hours as needed for nausea  Patient Instructions: 1)  Please schedule a follow-up appointment in 3 months .

## 2010-04-07 NOTE — Assessment & Plan Note (Signed)
Summary: rto 3 months/cbs   Vital Signs:  Patient profile:   77 year old male Weight:      175.38 pounds Pulse rate:   56 / minute Pulse rhythm:   regular BP sitting:   130 / 74  (left arm) Cuff size:   regular  Vitals Entered By: Army Fossa CMA (November 09, 2009 12:50 PM) CC: 3 month f/u- not fasting Comments flu shot.   History of Present Illness: ROV no major concerns   Allergies: 1)  ! * Colchicine  Past History:  Past Medical History: CAD--Dr Tennant--MI 08-04-85--CABG '87; low risk stress test  9-09 ECHO 11-08--NL fx, decreased LV relaxation & Dr Deborah Chalk office Abd.u/s  6-09 (-) for AAA Carotid u/s 11-09 0-39%, repeat 2  year Lexiscan stable 10-2009 ---------------------------------------------------------------------------------------------------------------------- hyperlipidemia DM w/ peripheral neuropathy HTN OSA, Dx 2006; uses CPAP soemtimes, limited tolerance RENAL INSUFFICIENCY, CHRONIC w/ SOLITARY KIDNEY, CONGENITAL   DEPRESSION Osteoarthritis Gout ------------  gout  diskitis 10/2008, Dr Dareen Piano  gait d/o  (chronic imbalance)  Social History: Reviewed history from 09/05/2006 and no changes required. Married two children retired  Review of Systems        OA-- pain med was increased  @ last OV, some help with higher dose of meds. Has not seen ortho  CAD-- recent stress test reviewed, stable  DM-- ambulatory CBGs in the 110 to 130  HTN-- BP was low last week, now  back up to normal    Physical Exam  General:  alert and well-developed.   Lungs:  normal respiratory effort, no intercostal retractions, no accessory muscle use, and normal breath sounds.   Heart:  normal rate, regular rhythm, and no murmur.   Extremities:  no edema     Impression & Recommendations:  Problem # 1:  HYPERTENSION (ICD-401.9) at goal  His updated medication list for this problem includes:    Furosemide 40 Mg Tabs (Furosemide) .Marland Kitchen... 1 by mouth qd  Atenolol 25 Mg Tabs (Atenolol) .Marland Kitchen... 1 by mouth once daily  BP today: 130/74 Prior BP: 128/82 (10/06/2009)  Labs Reviewed: K+: 4.4 (08/05/2009) Creat: : 1.2 (08/05/2009)   Chol: 168 (02/22/2009)   HDL: 39 (02/22/2009)   LDL: 86 (02/22/2009)   TG: 215 (02/22/2009)  Orders: Venipuncture (16109) TLB-BMP (Basic Metabolic Panel-BMET) (80048-METABOL) TLB-CBC Platelet - w/Differential (85025-CBCD) Specimen Handling (60454)  Problem # 2:  DIABETES MELLITUS, TYPE II (ICD-250.00)  His updated medication list for this problem includes:    Adult Aspirin Low Strength 81 Mg Tbdp (Aspirin)  Labs Reviewed: Creat: 1.2 (08/05/2009)    Reviewed HgBA1c results: 6.2 (08/05/2009)  6.5 (03/23/2009)  Orders: TLB-A1C / Hgb A1C (Glycohemoglobin) (83036-A1C) Specimen Handling (09811)  Problem # 3:  OSTEOARTHRITIS (ICD-715.90) pain slightly better controlled with increased dose of oxycodone. Patient reluctant to see  orthopedic surgery for now The following medications were removed from the medication list:    Tylenol Arthritis Pain Tbcr (Acetaminophen tbcr) .Marland Kitchen... Prn His updated medication list for this problem includes:    Adult Aspirin Low Strength 81 Mg Tbdp (Aspirin)    Endocet 7.5-500 Mg Tabs (Oxycodone-acetaminophen) ..... One by mouth every 4 hours as needed for pain  Problem # 4:  RENAL INSUFFICIENCY, CHRONIC (ICD-585.9) recheck a BMP  Complete Medication List: 1)  Furosemide 40 Mg Tabs (Furosemide) .Marland Kitchen.. 1 by mouth qd 2)  Atenolol 25 Mg Tabs (Atenolol) .Marland Kitchen.. 1 by mouth once daily 3)  Niacin 500 Mg Tabs (Niacin) .Marland Kitchen.. 1 by mouth qhs 4)  Simvastatin  80 Mg Tabs (Simvastatin) .Marland Kitchen.. 1 by mouth at bedtime 5)  Gabapentin 400 Mg Caps (Gabapentin) .... 3 qam; 2 qpm 6)  Centrum Silver Tabs (Multiple vitamins-minerals) 7)  Flaxseed Oil Caps (Flaxseed (linseed) caps) 8)  Glucosamine-chondroitin Caps (Glucosamine-chondroitin caps) 9)  Adult Aspirin Low Strength 81 Mg Tbdp (Aspirin) 10)  B12  11)   Fish Oil 1000 Mg Caps (Omega-3 fatty acids) .... 2 by mouth tid 12)  Endocet 7.5-500 Mg Tabs (Oxycodone-acetaminophen) .... One by mouth every 4 hours as needed for pain 13)  Uloric 40 Mg Tabs (Febuxostat) .... Daily  Other Orders: Flu Vaccine 25yrs + MEDICARE PATIENTS (E4540) Administration Flu vaccine - MCR (J8119) Flu Vaccine Consent Questions     Do you have a history of severe allergic reactions to this vaccine? no    Any prior history of allergic reactions to egg and/or gelatin? no    Do you have a sensitivity to the preservative Thimersol? no    Do you have a past history of Guillan-Barre Syndrome? no    Do you currently have an acute febrile illness? no    Have you ever had a severe reaction to latex? no    Vaccine information given and explained to patient? yes    Are you currently pregnant? no    Lot Number:[id number]   Exp Date:09/03/2010   Site Given  Left Deltoid IM  Patient Instructions: 1)  Please schedule a follow-up appointment in 4 months .  Prescriptions: ENDOCET 7.5-500 MG TABS (OXYCODONE-ACETAMINOPHEN) one by mouth every 4 hours as needed for pain  #90 x 0   Entered and Authorized by:   Nolon Rod. Paz MD   Signed by:   Nolon Rod. Paz MD on 11/09/2009   Method used:   Print then Give to Patient   RxID:   [id number]     .[email or web address]

## 2010-04-07 NOTE — Letter (Signed)
Summary: CMN for [facility]  CMN for [facility]   Imported By: Lanelle Bal 04/20/2009 10:52:12  _____________________________________________________________________  External Attachment:    Type:   Image     Comment:   External Document

## 2010-04-07 NOTE — Assessment & Plan Note (Signed)
Summary: Cardiology Nuclear Testing  Nuclear Med Background Indications for Stress Test: Evaluation for Ischemia, Graft Patency   History: CABG, Echo, Heart Catheterization, Myocardial Infarction, Myocardial Perfusion Study  History Comments: '87 MI >Cath> CABG '08 Echo: NL EF '09 MPS: Inf. Scar, (-) ischemia, NL EF  Symptoms: Palpitations    Nuclear Pre-Procedure Cardiac Risk Factors: Family History - CAD, History of Smoking, Hypertension, Lipids, NIDDM, PVD Caffeine/Decaff Intake: none NPO After: 8:30 AM Lungs: Clear IV 0.9% NS with Angio Cath: 22g     IV Site: Right AC IV Started by: Bonnita Levan RN Chest Size (in) 42     Height (in): 68 Weight (lb): 173 BMI: 26.40 Tech Comments: Held atenolol 24 hrs.  Nuclear Med Study 1 or 2 day study:  1 day     Stress Test Type:  Eugenie Birks Reading MD:  Marca Ancona, MD     Referring MD:  Delfin Edis Resting Radionuclide:  Technetium 53m Tetrofosmin     Resting Radionuclide Dose:  10.7 mCi  Stress Radionuclide:  Technetium 54m Tetrofosmin     Stress Radionuclide Dose:  32.5 mCi   Stress Protocol      Max HR:  65 bpm     Predicted Max HR:  140 bpm  Max Systolic BP: 130 mm Hg     Percent Max HR:  46.43 %Rate Pressure Product:  8450  Lexiscan: 0.4 mg   Stress Test Technologist:  Irean Hong RN     Nuclear Technologist:  Harlow Asa CNMT  Rest Procedure  Myocardial perfusion imaging was performed at rest 45 minutes following the intravenous administration of Myoview Technetium 35m Tetrofosmin.  Stress Procedure  The patient received IV Lexiscan 0.4 mg over 15-seconds.  Myoview injected at 30-seconds.  There were no significant changes with lexiscan, but there was baseline inferior/lateral T wave inversion,rare PVC.  Quantitative spect images were obtained after a 45 minute delay.  QPS Raw Data Images:  Normal; no motion artifact; normal heart/lung ratio. Stress Images:  Moderate inferior perfusion defect.  Rest Images:   Moderate inferior perfusion defect.  Subtraction (SDS):  Fixed moderate inferior perfusion defect.  Transient Ischemic Dilatation:  1.02  (Normal <1.22)  Lung/Heart Ratio:  0.38  (Normal <0.45)  Quantitative Gated Spect Images QGS EDV:  129 ml QGS ESV:  55 ml QGS EF:  57 % QGS cine images:  Basal inferior hypokinesis.    Overall Impression  Exercise Capacity: Lexiscan study BP Response: Normal blood pressure response. Clinical Symptoms: Short of breath.  ECG Impression: No change from baseline (inferolateral T wave inversions at baseline).  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  Basal inferior wall motion abnormality Overall Impression Comments: Overall EF preserved.

## 2010-04-07 NOTE — Progress Notes (Signed)
Summary: WANTS TO HOLD OFF ON THE XRAY AND ORTHO REFERRAL  Phone Note Call from Patient   Caller: Spouse  Becky Summary of Call: pt seen 5/2/1, still with pain and walks humped over, cannot straighten up says it hurts" --wife feels should be looked at or xrays, pt is taking endocet, cyclobenzaprine, uisng ice and heat Initial call taken by: Kandice Hams,  Jul 12, 2009 12:00 PM  Follow-up for Phone Call        we will send pt for xrays.  go to [address] [employer]- order is in the computer.  will also refer to ortho. Follow-up by: Neena Rhymes MD,  Jul 12, 2009 12:08 PM  Additional Follow-up for Phone Call Additional follow up Details #1::        Wife called back pt got uo says he feels better wants to hold off" for the xray and orth referral for now. Kandice Hams  Jul 12, 2009 12:33 PM     Additional Follow-up for Phone Call Additional follow up Details #2::    noted Follow-up by: Neena Rhymes MD,  Jul 12, 2009 12:41 PM

## 2010-04-07 NOTE — Assessment & Plan Note (Addendum)
Summary: rto 4 months/cbs   Vital Signs:  Patient profile:   77 year old male Height:      68 inches (172.72 cm) Weight:      179.50 pounds (81.59 kg) BMI:     27.39 Temp:     98.2 degrees F (36.78 degrees C) oral BP sitting:   140 / 60  (left arm) Cuff size:   regular  Vitals Entered By: Lucious Groves CMA (March 14, 2010 12:45 PM) CC: 77 mo rtn ov./kb Is Patient Diabetic? Yes Pain Assessment Patient in pain? no      Comments Patient notes that he needs refill of Endocet.    History of Present Illness: four-month ROV  c/o diff. focusing his eyes  has seen the eye doctor consistently every year and has no h/o DM retinopathy  last eye check  ~  9-11 Dr Diamantina Monks  hyperlipidemia-- good medication compliance w/ high simvastatin dose   DM -- ambulatory CBGs 100 to 120   HTN-- ambulatory BPs 120/60  "ususally better than today"  Gout---  saw  Dr Dareen Piano 9-11, current dose of prednisone 2.5, next visit w/ them  ~ 3 months     Current Medications (verified): 1)  Furosemide 40 Mg  Tabs (Furosemide) .Marland Kitchen.. 1 By Mouth Qd 2)  Atenolol 25 Mg  Tabs (Atenolol) .Marland Kitchen.. 1 By Mouth Once Daily 3)  Niacin 500 Mg  Tabs (Niacin) .Marland Kitchen.. 1 By Mouth Qhs 4)  Simvastatin 80 Mg  Tabs (Simvastatin) .Marland Kitchen.. 1 By Mouth At Bedtime 5)  Gabapentin 400 Mg  Caps (Gabapentin) .... 3 Qam; 2 Qpm 6)  Centrum Silver   Tabs (Multiple Vitamins-Minerals) 7)  Flaxseed Oil   Caps (Flaxseed (Linseed) Caps) 8)  Glucosamine-Chondroitin   Caps (Glucosamine-Chondroitin Caps) 9)  Adult Aspirin Low Strength 81 Mg  Tbdp (Aspirin) 10)  B12 11)  Fish Oil 1000 Mg Caps (Omega-3 Fatty Acids) .... 2 By Mouth Tid 12)  Endocet 7.5-500 Mg Tabs (Oxycodone-Acetaminophen) .... One By Mouth Every 4 Hours As Needed For Pain 13)  Uloric 40 Mg Tabs (Febuxostat) .... Daily 14)  Prednisone 5 Mg Tabs (Prednisone) .... 0.5 Tab By Mouth Qam 15)  Clear Lax .... By Mouth Once Daily or Qod  Allergies (verified): 1)  ! * Colchicine  Past  History:  Past Medical History: Reviewed history from 11/09/2009 and no changes required. CAD--Dr Tennant--MI 08-04-85--CABG '87; low risk stress test  9-09 ECHO 11-08--NL fx, decreased LV relaxation & Dr Deborah Chalk office Abd.u/s  6-09 (-) for AAA Carotid u/s 11-09 0-39%, repeat 2  year Lexiscan stable 10-2009 ---------------------------------------------------------------------------------------------------------------------- hyperlipidemia DM w/ peripheral neuropathy HTN OSA, Dx 2006; uses CPAP soemtimes, limited tolerance RENAL INSUFFICIENCY, CHRONIC w/ SOLITARY KIDNEY, CONGENITAL   DEPRESSION Osteoarthritis Gout ------------  gout  diskitis 10/2008, Dr Dareen Piano  gait d/o  (chronic imbalance)  Past Surgical History: Reviewed history from 09/05/2006 and no changes required. Inguinal herniorrhaphy (03/07/1983) Coronary artery bypass graft (01/04/1986) Total knee replacement (05/04/1989) -left Cataract extraction (09/1999) rt Cataract extraction (04/2003) -left knuckles replaced in left hand - 05/2005  Review of Systems CV:  Denies chest pain or discomfort and palpitations. Resp:  Denies cough and shortness of breath. GI:  Denies bloody stools, nausea, and vomiting. GU:  Denies dysuria, hematuria, urinary frequency, and urinary hesitancy; "stream seems a little weaker".  Physical Exam  General:  alert and well-developed.   Lungs:  normal respiratory effort, no intercostal retractions, no accessory muscle use, and normal breath sounds.   Heart:  normal rate, regular rhythm, and no murmur.   Rectal:  No external abnormalities noted. Normal sphincter tone. No rectal masses or tenderness. brown stools, Hemoccult negative Prostate:  Prostate gland firm and smooth, no enlargement, nodularity, tenderness, mass, asymmetry or induration. Extremities:  no edema   Psych:  not anxious appearing and not depressed appearing.     Impression & Recommendations:  Problem # 1:  CORONARY ARTERY  DISEASE (ICD-414.00) asymptomatic His updated medication list for this problem includes:    Furosemide 40 Mg Tabs (Furosemide) .Marland Kitchen... 1 by mouth qd    Atenolol 25 Mg Tabs (Atenolol) .Marland Kitchen... 1 by mouth once daily    Adult Aspirin Low Strength 81 Mg Tbdp (Aspirin)  Problem # 2:  HYPERTENSION (ICD-401.9) no change His updated medication list for this problem includes:    Furosemide 40 Mg Tabs (Furosemide) .Marland Kitchen... 1 by mouth qd    Atenolol 25 Mg Tabs (Atenolol) .Marland Kitchen... 1 by mouth once daily  BP today: 140/60 Prior BP: 130/74 (11/09/2009)  Labs Reviewed: K+: 4.7 (11/09/2009) Creat: : 1.4 (11/09/2009)   Chol: 168 (02/22/2009)   HDL: 39 (02/22/2009)   LDL: 86 (02/22/2009)   TG: 215 (02/22/2009)  Problem # 3:  DIABETES MELLITUS, TYPE II (ICD-250.00) on diet only, labs His updated medication list for this problem includes:    Adult Aspirin Low Strength 81 Mg Tbdp (Aspirin)  Labs Reviewed: Creat: 1.4 (11/09/2009)    Reviewed HgBA1c results: 6.0 (11/09/2009)  6.2 (08/05/2009)  Orders: TLB-A1C / Hgb A1C (Glycohemoglobin) (83036-A1C) Specimen Handling (75643)  Problem # 4:  HEALTH SCREENING (ICD-V70.0) due for a physical exam, see instructions  We did a DRE  today (normal),  we'll check a PSA  as far as   colonoscopies, the patient reports several colonoscopies before by Dr Madilyn Fireman ;  was supposedly due for a repeat colonoscopy in 2010, the patient reports GI called   and let that "up to him" plan: Will call GI and see if a colonoscopy is needed  Problem # 5:  RENAL INSUFFICIENCY, CHRONIC (ICD-585.9) creatinine has been stable over time. Monitoring  him for anemia today   Labs Reviewed: BUN: 37 (11/09/2009)   Cr: 1.4 (11/09/2009)    Hgb: 11.7 (11/09/2009)   Hct: 33.5 (11/09/2009)   Ca++: 10.0 (11/09/2009)    TP: 7.2 (08/05/2009)   Alb: 3.6 (08/05/2009)  Orders: Venipuncture (32951) TLB-Hemoglobin (Hgb) (85018-HGB) Specimen Handling (88416)  Complete Medication List: 1)  Furosemide  40 Mg Tabs (Furosemide) .Marland Kitchen.. 1 by mouth qd 2)  Atenolol 25 Mg Tabs (Atenolol) .Marland Kitchen.. 1 by mouth once daily 3)  Niacin 500 Mg Tabs (Niacin) .Marland Kitchen.. 1 by mouth qhs 4)  Simvastatin 80 Mg Tabs (Simvastatin) .Marland Kitchen.. 1 by mouth at bedtime 5)  Gabapentin 400 Mg Caps (Gabapentin) .... 3 qam; 2 qpm 6)  Centrum Silver Tabs (Multiple vitamins-minerals) 7)  Flaxseed Oil Caps (Flaxseed (linseed) caps) 8)  Glucosamine-chondroitin Caps (Glucosamine-chondroitin caps) 9)  Adult Aspirin Low Strength 81 Mg Tbdp (Aspirin) 10)  B12  11)  Fish Oil 1000 Mg Caps (Omega-3 fatty acids) .... 2 by mouth tid 12)  Endocet 7.5-500 Mg Tabs (Oxycodone-acetaminophen) .... One by mouth every 4 hours as needed for pain 13)  Uloric 40 Mg Tabs (Febuxostat) .... Daily 14)  Prednisone 5 Mg Tabs (Prednisone) .... 0.5 tab by mouth qam 15)  Clear Lax  .... By mouth once daily or qod  Other Orders: TLB-PSA (Prostate Specific Antigen) (84153-PSA)  Patient Instructions: 1)  Please schedule a follow-up appointment  in 4 months ,  fasting, physical exam Prescriptions: ENDOCET 7.5-500 MG TABS (OXYCODONE-ACETAMINOPHEN) one by mouth every 4 hours as needed for pain  #90 x 0   Entered and Authorized by:   Nolon Rod. Rayleigh Gillyard MD   Signed by:   Nolon Rod. Toluwani Yadav MD on 03/14/2010   Method used:   Print then Give to Patient   RxID:   [id number]    Orders Added: 1)  Venipuncture [36415] 2)  TLB-A1C / Hgb A1C (Glycohemoglobin) [83036-A1C] 3)  TLB-PSA (Prostate Specific Antigen) [84153-PSA] 4)  TLB-Hemoglobin (Hgb) [85018-HGB] 5)  Specimen Handling [99000] 6)  Est. Patient Level IV [62952]  Appended Document: rto 4 months/cbs per GI last Cscope was in 2005- was supposed to repeat in 5 years. will discuss on RTC

## 2010-04-07 NOTE — Assessment & Plan Note (Signed)
Summary: knee pain   Vital Signs:  Patient profile:   77 year old male Weight:      177.38 pounds Pulse rate:   61 / minute Pulse rhythm:   regular BP sitting:   128 / 82  (left arm) Cuff size:   regular  Vitals Entered By: Army Fossa CMA (October 06, 2009 11:45 AM) CC: Pt here for knee pain in both knees Comments hurts the most when getting up and down   History of Present Illness: chief complaint today is bilateral knee pain He has a long history of right knee pain, symptoms worse since  he discontinued prednisone few months ago He has a history of a left total knee replacement in 1991, he was pain-free up to a few months ago. pain is worse when he tries to stand up  ROS mild lower extremity edema No knee redness Current  medications for OA... -Endocet -Glucosamine -Not taking Tylenol at this point was recently seen feeling unwell, his sister was very sick, eventually she passed away. Patient is doing okay emotionally  Current Medications (verified): 1)  Furosemide 40 Mg  Tabs (Furosemide) .Marland Kitchen.. 1 By Mouth Qd 2)  Atenolol 25 Mg  Tabs (Atenolol) .Marland Kitchen.. 1 By Mouth Once Daily 3)  Niacin 500 Mg  Tabs (Niacin) .Marland Kitchen.. 1 By Mouth Qhs 4)  Simvastatin 80 Mg  Tabs (Simvastatin) .Marland Kitchen.. 1 By Mouth At Bedtime 5)  Gabapentin 400 Mg  Caps (Gabapentin) .... 3 Qam; 2 Qpm 6)  Tylenol Arthritis Pain   Tbcr (Acetaminophen Tbcr) .... Prn 7)  Centrum Silver   Tabs (Multiple Vitamins-Minerals) 8)  Flaxseed Oil   Caps (Flaxseed (Linseed) Caps) 9)  Glucosamine-Chondroitin   Caps (Glucosamine-Chondroitin Caps) 10)  Adult Aspirin Low Strength 81 Mg  Tbdp (Aspirin) 11)  B12 12)  Fish Oil 1000 Mg Caps (Omega-3 Fatty Acids) .... 2 By Mouth Tid 13)  Endocet 5-325 Mg Tabs (Oxycodone-Acetaminophen) .Marland Kitchen.. 1 By Mouth Every 6 Hours 14)  Uloric 40 Mg Tabs (Febuxostat) .... Daily  Allergies (verified): No Known Drug Allergies  Past History:  Past Medical History: CAD--Dr Tennant--MI 08-04-85--CABG '87;  low risk stress test  9-09 ECHO 11-08--NL fx, decreased LV relaxation & Dr Deborah Chalk office Abd.u/s  6-09 (-) for AAA Carotid u/s 11-09 0-39%, repeat 2  year ---------------------------------------------------------------------------------------------------------------------- hyperlipidemia DM w/ peripheral neuropathy HTN OSA, Dx 2006; uses CPAP soemtimes, limited tolerance RENAL INSUFFICIENCY, CHRONIC w/ SOLITARY KIDNEY, CONGENITAL   DEPRESSION Osteoarthritis Gout ------------  gout  diskitis 10/2008, Dr Dareen Piano  gait d/o  (chronic imbalance)  Past Surgical History: Reviewed history from 09/05/2006 and no changes required. Inguinal herniorrhaphy (03/07/1983) Coronary artery bypass graft (01/04/1986) Total knee replacement (05/04/1989) -left Cataract extraction (09/1999) rt Cataract extraction (04/2003) -left knuckles replaced in left hand - 05/2005  Social History: Reviewed history from 09/05/2006 and no changes required. Married two children retired  Review of Systems      See HPI  Physical Exam  General:  alert and well-developed.   Extremities:  no edema left knee: well-healed surgical scar  no redness or swelling Range of motion normal Right knee: Deformities consistent with DJD without redness or effusion by physical exam     Impression & Recommendations:  Problem # 1:  OSTEOARTHRITIS (ICD-715.90) vein likely from DJD, symptoms were temporarily better while on prednisone  ( prescribed for gout discitis) but now that he is off the steroids the symptoms are worse. He has not seen by orthopedic surgeon in a while He is  hurting at the left knee which was replaced 21 years ago Plan increase Endocet dose from 5-7.5 mg. Watch for side effects, already has constipation from it , see instructions  Rec to see his orthopedic surgeon ([facility]) Keep appointment with rheumatology if not improved, will consider low dose of steroids addendum: patient states he took  a low dose x years w/o ill effects, his last DEXA was WNL. Again will consider re start steroids His updated medication list for this problem includes:    Tylenol Arthritis Pain Tbcr (Acetaminophen tbcr) .Marland Kitchen... Prn    Adult Aspirin Low Strength 81 Mg Tbdp (Aspirin)    Endocet 7.5-500 Mg Tabs (Oxycodone-acetaminophen) ..... One by mouth every 4 hours as needed for pain  Complete Medication List: 1)  Furosemide 40 Mg Tabs (Furosemide) .Marland Kitchen.. 1 by mouth qd 2)  Atenolol 25 Mg Tabs (Atenolol) .Marland Kitchen.. 1 by mouth once daily 3)  Niacin 500 Mg Tabs (Niacin) .Marland Kitchen.. 1 by mouth qhs 4)  Simvastatin 80 Mg Tabs (Simvastatin) .Marland Kitchen.. 1 by mouth at bedtime 5)  Gabapentin 400 Mg Caps (Gabapentin) .... 3 qam; 2 qpm 6)  Tylenol Arthritis Pain Tbcr (Acetaminophen tbcr) .... Prn 7)  Centrum Silver Tabs (Multiple vitamins-minerals) 8)  Flaxseed Oil Caps (Flaxseed (linseed) caps) 9)  Glucosamine-chondroitin Caps (Glucosamine-chondroitin caps) 10)  Adult Aspirin Low Strength 81 Mg Tbdp (Aspirin) 11)  B12  12)  Fish Oil 1000 Mg Caps (Omega-3 fatty acids) .... 2 by mouth tid 13)  Endocet 7.5-500 Mg Tabs (Oxycodone-acetaminophen) .... One by mouth every 4 hours as needed for pain 14)  Uloric 40 Mg Tabs (Febuxostat) .... Daily  Patient Instructions: 1)  please come back next month as planned for your routine visit 2)  for constipation: colace daily, metamucil 2 tablets in the morning with lots of water , milk of magnesia as needed Prescriptions: ENDOCET 7.5-500 MG TABS (OXYCODONE-ACETAMINOPHEN) one by mouth every 4 hours as needed for pain  #60 x 0   Entered and Authorized by:   Nolon Rod. Paz MD   Signed by:   Nolon Rod. Paz MD on 10/06/2009   Method used:   Print then Give to Patient   RxID:   [id number]

## 2010-04-07 NOTE — Progress Notes (Signed)
Summary: Refill Request  Phone Note Refill Request Message from:  Patient on December 15, 2009 11:19 AM  Refills Requested: Medication #1:  ENDOCET 7.5-500 MG TABS one by mouth every 4 hours as needed for pain   Dosage confirmed as above?Dosage Confirmed   Brand Name Necessary? No   Supply Requested: 1 month   Last Refilled: 11/09/2009 Please call pt when rx is ready, ok to leave a message.   Method Requested: Pick up at Office Next Appointment Scheduled: 1.9.12 Initial call taken by: Harold Barban,  December 15, 2009 11:19 AM  Follow-up for Phone Call        ok 90 and 1 RF Jose E. Paz MD  December 15, 2009 5:47 PM   Additional Follow-up for Phone Call Additional follow up Details #1::        Pt is aware that rx is ready. Army Fossa CMA  December 16, 2009 9:49 AM     Prescriptions: ENDOCET 7.5-500 MG TABS (OXYCODONE-ACETAMINOPHEN) one by mouth every 4 hours as needed for pain  #90 x 0   Entered by:   Army Fossa CMA   Authorized by:   Nolon Rod. Paz MD   Signed by:   Army Fossa CMA on 12/16/2009   Method used:   Print then Give to Patient   RxID:   [id number]

## 2010-04-07 NOTE — Progress Notes (Signed)
Summary: Refill Request  Phone Note Refill Request Call back at Home Phone [phone] Message from:  Patient on February 01, 2010 10:15 AM  Refills Requested: Medication #1:  ENDOCET 7.5-500 MG TABS one by mouth every 4 hours as needed for pain   Dosage confirmed as above?Dosage Confirmed   Supply Requested: 1 month   Last Refilled: 01/03/2010 Please call when ready, can leave message on machine if no one answers   Method Requested: Pick up at Office Next Appointment Scheduled: 1.9.12 Initial call taken by: Harold Barban,  February 01, 2010 10:15 AM  Follow-up for Phone Call        90, no refills Follow-up by: [facility] E. Lowella Kindley MD,  February 01, 2010 4:49 PM  Additional Follow-up for Phone Call Additional follow up Details #1::        Pt aware rx is ready for pick up. Army Fossa CMA  February 01, 2010 4:51 PM     Prescriptions: ENDOCET 7.5-500 MG TABS (OXYCODONE-ACETAMINOPHEN) one by mouth every 4 hours as needed for pain  #90 x 0   Entered by:   Army Fossa CMA   Authorized by:   Nolon Rod. Takenya Travaglini MD   Signed by:   Army Fossa CMA on 02/01/2010   Method used:   Print then Give to Patient   RxID:   [id number]

## 2010-04-07 NOTE — Letter (Signed)
Summary: gout f/u-----[facility]  [facility]   Imported By: Lanelle Bal 06/05/2009 11:11:20  _____________________________________________________________________  External Attachment:    Type:   Image     Comment:   External Document

## 2010-04-07 NOTE — Miscellaneous (Signed)
Summary: BONE DENSITY  Clinical Lists Changes  Orders: Added new Test order of T-Bone Densitometry (77080) - Signed Added new Test order of T-Lumbar Vertebral Assessment (77082) - Signed 

## 2010-04-07 NOTE — Assessment & Plan Note (Signed)
Summary: nausea off and on  and no appetite/alr   Vital Signs:  Patient profile:   78 year old male Weight:      174 pounds Temp:     98.1 degrees F oral BP sitting:   114 / 80  (left arm)  Vitals Entered By: Doristine Devoid (August 05, 2009 1:13 PM) CC: no energy, loss of appetite, and nausea w/ dry heaving    History of Present Illness: 77 yo man here today for fatigue, nausea, anorexia.  sxs started 2 weeks ago.  2 episodes of dry heaving upon waking.  AM nausea wears off as the day goes on.  lack of appetite persists throughout the day.  has lost 10 lbs in last month.  reports easy fatigue.  no new or different medicines.  denies CP, SOB.  no diarrhea-hx of constipation, using Miralax/Dulcolax as needed.  feels sxs are slowly improving w/ time.  admits to poor fluid intake.  noticed increased edema during the day over the last few months but this has recently improved.  ? low grade temp last week.  sister is in process of actively dying from lymphoma  Problems Prior to Update: 1)  Fatigue  (ICD-780.79) 2)  Weight Loss  (ICD-783.21) 3)  Nausea  (ICD-787.02) 4)  Hyperlipidemia  (ICD-272.4) 5)  Back Pain  (ICD-724.5) 6)  Neck Pain  (ICD-723.1) 7)  Special Screening Malignant Neoplasm of Prostate  (ICD-V76.44) 8)  Skin Lesion  (ICD-709.9) 9)  Carotid Artery Disease  (ICD-433.10) 10)  Gait Disturbance  (ICD-781.2) 11)  Gout  (ICD-274.9) 12)  Coronary Artery Disease  (ICD-414.00) 13)  Hypertension  (ICD-401.9) 14)  Diabetes Mellitus, Type II  (ICD-250.00) 15)  Myocardial Infarction, Hx of  (ICD-412) 16)  Osteoarthritis  (ICD-715.90) 17)  Obstructive Sleep Apnea  (ICD-327.23) 18)  Health Screening  (ICD-V70.0) 19)  Renal Insufficiency, Chronic  (ICD-585.9) 20)  Sleep Apnea  (ICD-780.57) 21)  Peripheral Neuropathy  (ICD-356.9) 22)  Solitary Kidney, Congenital  (ICD-753.0) 23)  Arthroscopy, Right Knee, Hx of  (ICD-V45.89) 24)  Aneurysm Nos  (ICD-442.9)  Current Medications  (verified): 1)  Furosemide 40 Mg  Tabs (Furosemide) .Marland Kitchen.. 1 By Mouth Qd 2)  Atenolol 25 Mg  Tabs (Atenolol) .Marland Kitchen.. 1 By Mouth Once Daily 3)  Niacin 500 Mg  Tabs (Niacin) .Marland Kitchen.. 1 By Mouth Qhs 4)  Simvastatin 80 Mg  Tabs (Simvastatin) .Marland Kitchen.. 1 By Mouth At Bedtime 5)  Gabapentin 400 Mg  Caps (Gabapentin) .... 3 Qam; 2 Qpm 6)  Tylenol Arthritis Pain   Tbcr (Acetaminophen Tbcr) .... Prn 7)  Centrum Silver   Tabs (Multiple Vitamins-Minerals) 8)  Flaxseed Oil   Caps (Flaxseed (Linseed) Caps) 9)  Glucosamine-Chondroitin   Caps (Glucosamine-Chondroitin Caps) 10)  Adult Aspirin Low Strength 81 Mg  Tbdp (Aspirin) 11)  B12 12)  Fish Oil 1000 Mg Caps (Omega-3 Fatty Acids) .... 2 By Mouth Tid 13)  Endocet 5-325 Mg Tabs (Oxycodone-Acetaminophen) .Marland Kitchen.. 1 By Mouth Every 6 Hours 14)  Uloric 40 Mg Tabs (Febuxostat) .... Daily  Allergies (verified): No Known Drug Allergies  Past History:  Past Medical History: Last updated: 03/23/2009 CAD--Dr Tennant--MI 08-04[birth year]85--CABG '87; low risk stress test  9-09 ECHO 11-08--NL fx, decreased LV relaxation & Dr Deborah Chalk office Abd.u/s  6-09 (-) for AAA Carotid u/s 11-09 0-39%, repeat 2  year ---------------------------------------------------------------------------------------------------------------------- hyperlipidemia DM w/ peripheral neuropathy HTN OSA, Dx 2006; uses CPAP soemtimes, limited tolerance RENAL INSUFFICIENCY, CHRONIC w/ SOLITARY KIDNEY, CONGENITAL   DEPRESSION Osteoarthritis Gout  gout  diskitis 10/2008, Dr Dareen Piano  gait d/o  (chronic imbalance)  Family History: sister- lymphoma  Review of Systems      See HPI  Physical Exam  General:  alert and well-developed.  appears much more frail than last visit Head:  NCAT Eyes:  pupils unequal in size but reactive Neck:  No deformities, masses, or tenderness noted. Lungs:  normal respiratory effort, no intercostal retractions, no accessory muscle use, and normal breath sounds.   Heart:  normal rate,  regular rhythm, and no murmur.   Abdomen:  soft, NT/ND, +BS Pulses:  +2 carotid, radial +1 DP Extremities:  no C/C/E Neurologic:  alert & oriented X3 and cranial nerves II-XII intact.     Impression & Recommendations:  Problem # 1:  NAUSEA (ICD-787.02) Assessment New pt's AM nausea not typical of infxn, given level of diabetes control pt may be having hypoglycemia.  check labs to r/o infxn, electrolyte abnormalities, liver dysfxn.  start Zofran as needed. Orders: TLB-BMP (Basic Metabolic Panel-BMET) (80048-METABOL) TLB-Hepatic/Liver Function Pnl (80076-HEPATIC) TLB-A1C / Hgb A1C (Glycohemoglobin) (83036-A1C) Prescription Created Electronically [phone])  His updated medication list for this problem includes:    Zofran 4 Mg Tabs (Ondansetron hcl) .Marland Kitchen... 1 by mouth every 8 hours as needed for nausea  Problem # 2:  WEIGHT LOSS (ICD-783.21) Assessment: New pt has lost 9 lbs in 1 month.  + anorexia.  unclear as to whether this is stress related w/ sister's illness vs underlying disease process.  check labs.  may need imaging to assess for ? malignancy if ESR extremely elevated. Orders: Venipuncture (52841) TLB-CBC Platelet - w/Differential (85025-CBCD) TLB-TSH (Thyroid Stimulating Hormone) (84443-TSH) TLB-Sedimentation Rate (ESR) (85652-ESR)  Problem # 3:  FATIGUE (ICD-780.79) Assessment: New pt's easy fatigue may be related to anemia, thyroid abnormality, ? CHF given hx of edema.  PE WNL- no edema or lung crackles.  already on Lasix.  if BNP elevated will need ECHO. Orders: TLB-CBC Platelet - w/Differential (85025-CBCD) TLB-TSH (Thyroid Stimulating Hormone) (84443-TSH) TLB-BNP (B-Natriuretic Peptide) (83880-BNPR)  Complete Medication List: 1)  Furosemide 40 Mg Tabs (Furosemide) .Marland Kitchen.. 1 by mouth qd 2)  Atenolol 25 Mg Tabs (Atenolol) .Marland Kitchen.. 1 by mouth once daily 3)  Niacin 500 Mg Tabs (Niacin) .Marland Kitchen.. 1 by mouth qhs 4)  Simvastatin 80 Mg Tabs (Simvastatin) .Marland Kitchen.. 1 by mouth at bedtime 5)   Gabapentin 400 Mg Caps (Gabapentin) .... 3 qam; 2 qpm 6)  Tylenol Arthritis Pain Tbcr (Acetaminophen tbcr) .... Prn 7)  Centrum Silver Tabs (Multiple vitamins-minerals) 8)  Flaxseed Oil Caps (Flaxseed (linseed) caps) 9)  Glucosamine-chondroitin Caps (Glucosamine-chondroitin caps) 10)  Adult Aspirin Low Strength 81 Mg Tbdp (Aspirin) 11)  B12  12)  Fish Oil 1000 Mg Caps (Omega-3 fatty acids) .... 2 by mouth tid 13)  Endocet 5-325 Mg Tabs (Oxycodone-acetaminophen) .Marland Kitchen.. 1 by mouth every 6 hours 14)  Uloric 40 Mg Tabs (Febuxostat) .... Daily 15)  Zofran 4 Mg Tabs (Ondansetron hcl) .Marland Kitchen.. 1 by mouth every 8 hours as needed for nausea  Patient Instructions: 1)  Please follow up with Dr Drue Novel early next week 2)  We'll notify you of your lab results 3)  Please drink plenty of fluids 4)  Try and eat regularly- the Zofran will help w/ the nausea 5)  If you develop worsening symptoms- chest pain, shortness of breath, dizziness, or other concerns- please go to the ER 6)  Hang in there! Prescriptions: ZOFRAN 4 MG TABS (ONDANSETRON HCL) 1 by mouth every 8 hours as needed  for nausea  #20 x 0   Entered and Authorized by:   Neena Rhymes MD   Signed by:   Neena Rhymes MD on 08/05/2009   Method used:   Electronically to        [facility] (retail)       [address]       [place], [place]  [address]       Ph: [phone]       Fax: [phone]   RxID:   [id number]

## 2010-04-07 NOTE — Letter (Signed)
Summary: [facility]   Imported By: Lanelle Bal 12/08/2009 09:00:53  _____________________________________________________________________  External Attachment:    Type:   Image     Comment:   External Document

## 2010-04-07 NOTE — Miscellaneous (Signed)
Summary: Orders Update  Clinical Lists Changes  Orders: Added new Test order of Carotid Duplex (Carotid Duplex) - Signed 

## 2010-04-07 NOTE — Assessment & Plan Note (Signed)
Summary: BACK INJURY ON 07-02-2009/RH......Marland Kitchen   Vital Signs:  Patient profile:   77 year old male Weight:      185 pounds Pulse rate:   63 / minute BP sitting:   140 / 70  (left arm)  Vitals Entered By: Doristine Devoid (Jul 05, 2009 1:21 PM) CC: L upper back and shoulder pain xfri after fall    History of Present Illness: 77 yo man here today for L back and shoulder pain after fall on Fri.  fell off riding mower and landed on his back.  first landed on butt and then fell back onto shoulder blade.  able to move arm but will have intermittant shooting pains.  pain is sharp enough to take his breath away and ends in 1/2 sec or less.  taking Endocet for arthritis w/ some relief.  Allergies (verified): No Known Drug Allergies  Review of Systems      See HPI  Physical Exam  General:  alert and well-developed.   Lungs:  normal respiratory effort, no intercostal retractions, no accessory muscle use, and normal breath sounds.   Heart:  normal rate, regular rhythm, and no murmur.   Msk:  full ROM of L shoulder no TTP along clavicle, humeral head, scapula + spasm of rhomboid Pulses:  +2 carotid, radial Neurologic:  strength normal in all extremities and DTRs symmetrical and normal.     Impression & Recommendations:  Problem # 1:  BACK PAIN (ICD-724.5) Assessment Unchanged no obvious bruising, ribs aren't TTP, full ROM of L shoulder.  + muscle spasm.  continue endocet, add muscle relaxer for pain relief.  add heat/ice.  reviewed supportive care and red flags that should prompt return.  Pt expresses understanding and is in agreement w/ this plan. His updated medication list for this problem includes:    Tylenol Arthritis Pain Tbcr (Acetaminophen tbcr) .Marland Kitchen... Prn    Adult Aspirin Low Strength 81 Mg Tbdp (Aspirin)    Endocet 5-325 Mg Tabs (Oxycodone-acetaminophen) .Marland Kitchen... 1 by mouth every 6 hours    Cyclobenzaprine Hcl 10 Mg Tabs (Cyclobenzaprine hcl) .Marland Kitchen... 1 by mouth 2 times daily as needed  for back pain  Complete Medication List: 1)  Furosemide 40 Mg Tabs (Furosemide) .Marland Kitchen.. 1 by mouth qd 2)  Atenolol 25 Mg Tabs (Atenolol) .Marland Kitchen.. 1 by mouth once daily 3)  Niacin 500 Mg Tabs (Niacin) .Marland Kitchen.. 1 by mouth qhs 4)  Simvastatin 80 Mg Tabs (Simvastatin) .Marland Kitchen.. 1 by mouth at bedtime 5)  Gabapentin 400 Mg Caps (Gabapentin) .... 3 qam; 2 qpm 6)  Tylenol Arthritis Pain Tbcr (Acetaminophen tbcr) .... Prn 7)  Centrum Silver Tabs (Multiple vitamins-minerals) 8)  Flaxseed Oil Caps (Flaxseed (linseed) caps) 9)  Glucosamine-chondroitin Caps (Glucosamine-chondroitin caps) 10)  Adult Aspirin Low Strength 81 Mg Tbdp (Aspirin) 11)  B12  12)  Fish Oil 1000 Mg Caps (Omega-3 fatty acids) .... 2 by mouth tid 13)  Endocet 5-325 Mg Tabs (Oxycodone-acetaminophen) .Marland Kitchen.. 1 by mouth every 6 hours 14)  Uloric 40 Mg Tabs (Febuxostat) .... Daily 15)  Cyclobenzaprine Hcl 10 Mg Tabs (Cyclobenzaprine hcl) .Marland Kitchen.. 1 by mouth 2 times daily as needed for back pain  Patient Instructions: 1)  Follow up as needed 2)  This is most likely a muscle strain/bruise 3)  Alternate ice (no more than 20 minutes) and heat 4)  Continue the Endocet as needed for severe pain 5)  Use the cyclobenzaprine (muscle relaxer) as needed for night pain 6)  If you are not improving  in the next 7-10 days or at any time worsening- please call 7)  Hang in there! Prescriptions: ENDOCET 5-325 MG TABS (OXYCODONE-ACETAMINOPHEN) 1 by mouth every 6 hours  #45 x 0   Entered and Authorized by:   Neena Rhymes MD   Signed by:   Neena Rhymes MD on 07/05/2009   Method used:   Print then Give to Patient   RxID:   [id number] CYCLOBENZAPRINE HCL 10 MG  TABS (CYCLOBENZAPRINE HCL) 1 by mouth 2 times daily as needed for back pain  #20 x 0   Entered and Authorized by:   Neena Rhymes MD   Signed by:   Neena Rhymes MD on 07/05/2009   Method used:   Electronically to        [facility] (retail)       [address]        [place], [place]  [address]       Ph: [phone]       Fax: [phone]   RxID:   [id number]

## 2010-04-07 NOTE — Progress Notes (Signed)
Summary: labs   Phone Note Outgoing Call   Call placed by: Doristine Devoid,  August 06, 2009 2:00 PM Call placed to: Patient Summary of Call: pt is mildly anemic compared to last check.  should have labs repeated in 1 month to trend- if still low at that time would benefit from stool cards to assess for blood loss.  no obvious evidence of infxn  electrolytes normal, DM well controlled, no liver abnormalities  non-specific elevation of ESR indicating some form of inflammation  also mildly elevated BNP but w/out any swelling on exam or crackles in lungs unlikely to be sufficient enough to cause nausea.  needs ECHO.  Follow-up for Phone Call        attempted to contact patient unable to leave msg Dr. Beverely Low will address at f/u on mon..........Marland KitchenDoristine Devoid  August 06, 2009 2:20 PM

## 2010-04-07 NOTE — Assessment & Plan Note (Signed)
Summary: 3 mth fu/ns/kdc   Vital Signs:  Patient profile:   77 year old male Height:      68 inches Weight:      181.2 pounds BMI:     27.65 Pulse rate:   60 / minute BP sitting:   110 / 70  Vitals Entered By: Shary Decamp (March 23, 2009 9:07 AM)  History of Present Illness: CAD-- to see cards within 2 weeks, his chol was  checked the last time overthere  DM-- ambulatory CBGs 120s   peripheral neuropathy-- nearly asx   HTN-- ambulatory BPs 120s  Gout-- now under the care of Dr Dareen Piano, s/p OV few weeks ago, back pain a lot better , several labs performed overthere recently   gait-- "alot worse at night"     Current Medications (verified): 1)  Furosemide 40 Mg  Tabs (Furosemide) .Marland Kitchen.. 1 By Mouth Qd 2)  Atenolol 25 Mg  Tabs (Atenolol) .Marland Kitchen.. 1 By Mouth Once Daily 3)  Niacin 500 Mg  Tabs (Niacin) .Marland Kitchen.. 1 By Mouth Qhs 4)  Simvastatin 80 Mg  Tabs (Simvastatin) .Marland Kitchen.. 1 By Mouth At Bedtime 5)  Gabapentin 400 Mg  Caps (Gabapentin) .... 3 Qam; 2 Qpm 6)  Tylenol Arthritis Pain   Tbcr (Acetaminophen Tbcr) .... Prn 7)  Centrum Silver   Tabs (Multiple Vitamins-Minerals) 8)  Flaxseed Oil   Caps (Flaxseed (Linseed) Caps) 9)  Glucosamine-Chondroitin   Caps (Glucosamine-Chondroitin Caps) 10)  Adult Aspirin Low Strength 81 Mg  Tbdp (Aspirin) 11)  B12 12)  One Touch Ultra Test Strips .... Test 1 X A Daydx 790.6 13)  Fish Oil 1000 Mg Caps (Omega-3 Fatty Acids) .... 2 By Mouth Tid 14)  Endocet 5-325 Mg Tabs (Oxycodone-Acetaminophen) .Marland Kitchen.. 1 By Mouth Every 6 Hours 15)  Prednisone 5 Mg Tabs (Prednisone) .... Daily 16)  Lantus 100 Unit/ml Soln (Insulin Glargine) .... 5 Units At Bedtime 17)  Uloric 40 Mg Tabs (Febuxostat) .... Daily  Allergies (verified): No Known Drug Allergies  Past History:  Past Medical History: CAD--Dr Tennant--MI 08-04[birth year]85--CABG '87; low risk stress test  9-09 ECHO 11-08--NL fx, decreased LV relaxation & Dr Deborah Chalk office Abd.u/s  6-09 (-) for AAA Carotid u/s 11-09  0-39%, repeat 2  year ---------------------------------------------------------------------------------------------------------------------- hyperlipidemia DM w/ peripheral neuropathy HTN OSA, Dx 2006; uses CPAP soemtimes, limited tolerance RENAL INSUFFICIENCY, CHRONIC w/ SOLITARY KIDNEY, CONGENITAL   DEPRESSION Osteoarthritis Gout   gout  diskitis 10/2008, Dr Dareen Piano  gait d/o  (chronic imbalance)  Past Surgical History: Reviewed history from 09/05/2006 and no changes required. Inguinal herniorrhaphy (03/07/1983) Coronary artery bypass graft (01/04/1986) Total knee replacement (05/04/1989) -left Cataract extraction (09/1999) rt Cataract extraction (04/2003) -left knuckles replaced in left hand - 05/2005  Review of Systems Eyes:  complain of occasional difficulty focusing his vision  when he turns his head quickly denies headaches, nausea,  diplopia last eye exam was 10/2008, negative. CV:  Denies chest pain or discomfort; some feet edema at the end of the day. GI:  Denies diarrhea, nausea, and vomiting. GU:  Denies hematuria, urinary frequency, and urinary hesitancy.  Physical Exam  General:  alert, well-developed, and well-nourished.   Lungs:  normal respiratory effort, no intercostal retractions, no accessory muscle use, and normal breath sounds.   Heart:  normal rate, regular rhythm, and no murmur.   Pulses:  normal pedal pulses bilaterally  Extremities:  not edema  Diabetes Management Exam:    Foot Exam (with socks and/or shoes not present):  Sensory-Pinprick/Light touch:          Left medial foot (L-4): normal          Left dorsal foot (L-5): normal          Left lateral foot (S-1): normal          Right medial foot (L-4): normal          Right dorsal foot (L-5): normal          Right lateral foot (S-1): normal       Sensory-Monofilament:          Left foot: normal          Right foot: normal       Inspection:          Left foot: normal          Right foot:  normal       Nails:          Left foot: normal          Right foot: normal   Impression & Recommendations:  Problem # 1:  the patient is getting labs from several doctors: [facility], [facility] will [facility] to get [facility] from recent labs done @ rheumatology  limit  labs today to a A1C  Problem # 2:  GAIT DISTURBANCE (ICD-781.2) states balance is worse at night recommend to  leave some lights  on at  nighttime, use a cane  Problem # 3:  GOUT (ICD-274.9) follow-up by rheumatology still on a low dose of prednisone His updated medication list for this problem includes:    Uloric 40 Mg Tabs (Febuxostat) .Marland Kitchen... Daily  Problem # 4:  CORONARY ARTERY DISEASE (ICD-414.00) asymptomatic, to see cardiology soon His updated medication list for this problem includes:    Furosemide 40 Mg Tabs (Furosemide) .Marland Kitchen... 1 by mouth qd    Atenolol 25 Mg Tabs (Atenolol) .Marland Kitchen... 1 by mouth once daily    Adult Aspirin Low Strength 81 Mg Tbdp (Aspirin)  Problem # 5:  HYPERTENSION (ICD-401.9) at goal  His updated medication list for this problem includes:    Furosemide 40 Mg Tabs (Furosemide) .Marland Kitchen... 1 by mouth qd    Atenolol 25 Mg Tabs (Atenolol) .Marland Kitchen... 1 by mouth once daily  BP today: 110/70 Prior BP: 110/60 (12/21/2008)  Labs Reviewed: K+: 3.7 (12/21/2008) Creat: : 1.3 (12/21/2008)   Chol: 168 (03/26/2008)   HDL: 39 (03/26/2008)   LDL: 86 (03/26/2008)   TG: 215 (03/26/2008)  Problem # 6:  DIABETES MELLITUS, TYPE II (ICD-250.00) labs feet examin today normal last eye exam 10/2008 has some eye symptoms, see review of systems: Recommend to see his ophthalmologist His updated medication list for this problem includes:    Adult Aspirin Low Strength 81 Mg Tbdp (Aspirin)    Lantus 100 Unit/ml Soln (Insulin glargine) .Marland KitchenMarland KitchenMarland KitchenMarland Kitchen 5 units at bedtime  Orders: Venipuncture (16109) TLB-A1C / Hgb A1C (Glycohemoglobin) (83036-A1C) TLB-Microalbumin/Creat Ratio, Urine (82043-MALB)  Problem # 7:  HYPERLIPIDEMIA  (ICD-272.4) last cholesterol check by cards  His updated medication list for this problem includes:    Niacin 500 Mg Tabs (Niacin) .Marland Kitchen... 1 by mouth qhs    Simvastatin 80 Mg Tabs (Simvastatin) .Marland Kitchen... 1 by mouth at bedtime  Labs Reviewed: SGOT: 24 (12/21/2008)   SGPT: 16 (12/21/2008)   HDL:39 (03/26/2008), 39 (12/11/2007)  LDL:86 (03/26/2008), 76 (12/11/2007)  Chol:168 (03/26/2008), 170 (12/11/2007)  Trig:215 (03/26/2008), 274 (12/11/2007)  Problem # 8:  RENAL INSUFFICIENCY, CHRONIC (ICD-585.9) Will obtain labs from Dr. Dareen Piano,  if a BMP has not been done  will order one  Complete Medication List: 1)  Furosemide 40 Mg Tabs (Furosemide) .Marland Kitchen.. 1 by mouth qd 2)  Atenolol 25 Mg Tabs (Atenolol) .Marland Kitchen.. 1 by mouth once daily 3)  Niacin 500 Mg Tabs (Niacin) .Marland Kitchen.. 1 by mouth qhs 4)  Simvastatin 80 Mg Tabs (Simvastatin) .Marland Kitchen.. 1 by mouth at bedtime 5)  Gabapentin 400 Mg Caps (Gabapentin) .... 3 qam; 2 qpm 6)  Tylenol Arthritis Pain Tbcr (Acetaminophen tbcr) .... Prn 7)  Centrum Silver Tabs (Multiple vitamins-minerals) 8)  Flaxseed Oil Caps (Flaxseed (linseed) caps) 9)  Glucosamine-chondroitin Caps (Glucosamine-chondroitin caps) 10)  Adult Aspirin Low Strength 81 Mg Tbdp (Aspirin) 11)  B12  12)  One Touch Ultra Test Strips  .... Test 1 x a daydx 790.6 13)  Fish Oil 1000 Mg Caps (Omega-3 fatty acids) .... 2 by mouth tid 14)  Endocet 5-325 Mg Tabs (Oxycodone-acetaminophen) .Marland Kitchen.. 1 by mouth every 6 hours 15)  Prednisone 5 Mg Tabs (Prednisone) .... Daily 16)  Lantus 100 Unit/ml Soln (Insulin glargine) .... 5 units at bedtime 17)  Uloric 40 Mg Tabs (Febuxostat) .... Daily  Patient Instructions: 1)  Please schedule a follow-up appointment in 3 months .

## 2010-04-07 NOTE — Letter (Signed)
Summary: CMN for [facility]  CMN for [facility]   Imported By: Lanelle Bal 04/20/2009 10:46:55  _____________________________________________________________________  External Attachment:    Type:   Image     Comment:   External Document

## 2010-04-07 NOTE — Progress Notes (Signed)
Summary: Nuclear Pre-Procedure  Phone Note Outgoing Call Call back at [facility] Phone [phone]   Call placed by: Stanton Kidney, EMT-P,  October 12, 2009 3:16 PM Action Taken: Phone Call Completed Summary of Call: Left message with information on [employer] Information Sheet (see scanned document for details).     Nuclear Med Background Indications for Stress Test: Evaluation for Ischemia, Graft Patency   History: CABG, Echo, Myocardial Infarction, Myocardial Perfusion Study  History Comments: '87 MI > CABG '08 Echo: NL EF '09 MPS: Inf. Scar, (-) ischemia, NL EF     Nuclear Pre-Procedure Cardiac Risk Factors: History of Smoking, Hypertension, Lipids, PVD Height (in): 68

## 2010-04-25 ENCOUNTER — Ambulatory Visit (INDEPENDENT_AMBULATORY_CARE_PROVIDER_SITE_OTHER): Payer: Medicare Other | Admitting: Cardiology

## 2010-04-25 DIAGNOSIS — I10 Essential (primary) hypertension: Secondary | ICD-10-CM

## 2010-04-26 ENCOUNTER — Telehealth: Payer: Self-pay | Admitting: Internal Medicine

## 2010-05-03 NOTE — Progress Notes (Signed)
Summary: Refill--Endocet  Phone Note Refill Request Call back at Home Phone [phone] Message from:  Patient on April 26, 2010 12:07 PM  Refills Requested: Medication #1:  ENDOCET 7.5-500 MG TABS one by mouth every 4 hours as needed for pain Initial call taken by: Lucious Groves CMA,  April 26, 2010 12:07 PM  Follow-up for Phone Call        90, 1 RF Mort Smelser E. Annslee Tercero MD  April 26, 2010 1:42 PM   Additional Follow-up for Phone Call Additional follow up Details #1::        pts wife is aware that rx is ready for pick up. Army Fossa CMA  April 26, 2010 2:06 PM     Prescriptions: ENDOCET 7.5-500 MG TABS (OXYCODONE-ACETAMINOPHEN) one by mouth every 4 hours as needed for pain  #90 x 0   Entered by:   Army Fossa CMA   Authorized by:   Nolon Rod. Miciah Shealy MD   Signed by:   Army Fossa CMA on 04/26/2010   Method used:   Print then Give to Patient   RxID:   [id number]

## 2010-06-07 ENCOUNTER — Telehealth: Payer: Self-pay | Admitting: Internal Medicine

## 2010-06-07 MED ORDER — OXYCODONE-ACETAMINOPHEN 7.5-500 MG PO TABS: 1.0000 | ORAL_TABLET | ORAL | Status: DC | PRN

## 2010-06-07 NOTE — Telephone Encounter (Signed)
Prescription done and printed [facility]

## 2010-06-07 NOTE — Telephone Encounter (Signed)
Patient wants rx for oxycodone ---patient will pick up [phone]

## 2010-06-07 NOTE — Telephone Encounter (Signed)
Left detailed message that rx was ready for pick up.

## 2010-06-11 LAB — APTT
aPTT: 22 seconds — ABNORMAL LOW (ref 24–37)
aPTT: 33 seconds (ref 24–37)

## 2010-06-11 LAB — BASIC METABOLIC PANEL
Calcium: 9.4 mg/dL (ref 8.4–10.5)
Creatinine, Ser: 1.26 mg/dL (ref 0.4–1.5)
GFR calc Af Amer: >60 mL/min (ref >60)

## 2010-06-11 LAB — GLUCOSE, CAPILLARY
Glucose-Capillary: 106 mg/dL — ABNORMAL HIGH (ref 70–99)
Glucose-Capillary: 108 mg/dL — ABNORMAL HIGH (ref 70–99)
Glucose-Capillary: 142 mg/dL — ABNORMAL HIGH (ref 70–99)
Glucose-Capillary: 146 mg/dL — ABNORMAL HIGH (ref 70–99)
Glucose-Capillary: 207 mg/dL — ABNORMAL HIGH (ref 70–99)
Glucose-Capillary: 212 mg/dL — ABNORMAL HIGH (ref 70–99)
Glucose-Capillary: 233 mg/dL — ABNORMAL HIGH (ref 70–99)
Glucose-Capillary: 94 mg/dL (ref 70–99)

## 2010-06-11 LAB — UIFE/LIGHT CHAINS/TP QN, 24-HR UR
Beta, Urine: DETECTED — AB
Free Kappa Lt Chains,Ur: 8.71 mg/dL — ABNORMAL HIGH (ref 0.04–1.51)
Free Lambda Lt Chains,Ur: 3.56 mg/dL — ABNORMAL HIGH (ref 0.08–1.01)
Free Lt Chn Excr Rate: 265.66 mg/d
Gamma Globulin, Urine: DETECTED — AB
Time: 24 hours
Total Protein, Urine-Ur/day: 424 mg/d — ABNORMAL HIGH (ref 10–140)

## 2010-06-11 LAB — URINALYSIS, ROUTINE W REFLEX MICROSCOPIC
Glucose, UA: NEGATIVE mg/dL
Protein, ur: NEGATIVE mg/dL
Urobilinogen, UA: 0.2 mg/dL (ref 0.0–1.0)

## 2010-06-11 LAB — CBC
HCT: 31.6 % — ABNORMAL LOW (ref 39.0–52.0)
Hemoglobin: 10.6 g/dL — ABNORMAL LOW (ref 13.0–17.0)
MCV: 98.5 fL (ref 78.0–100.0)
Platelets: 227 10*3/uL (ref 150–400)
RBC: 3.21 MIL/uL — ABNORMAL LOW (ref 4.22–5.81)
RBC: 3.36 MIL/uL — ABNORMAL LOW (ref 4.22–5.81)
RBC: 3.6 MIL/uL — ABNORMAL LOW (ref 4.22–5.81)
WBC: 10.5 10*3/uL (ref 4.0–10.5)
WBC: 12.1 10*3/uL — ABNORMAL HIGH (ref 4.0–10.5)
WBC: 9.4 10*3/uL (ref 4.0–10.5)

## 2010-06-11 LAB — PSA: PSA: 0.83 ng/mL (ref 0.10–4.00)

## 2010-06-11 LAB — AFB CULTURE WITH SMEAR (NOT AT ARMC): Acid Fast Smear: NONE SEEN

## 2010-06-11 LAB — PROTIME-INR: Prothrombin Time: 15.1 seconds (ref 11.6–15.2)

## 2010-06-11 LAB — BODY FLUID CULTURE

## 2010-06-12 LAB — CBC
HCT: 36.2 % — ABNORMAL LOW (ref 39.0–52.0)
MCHC: 33.2 g/dL (ref 30.0–36.0)
Platelets: 221 10*3/uL (ref 150–400)
RDW: 14 % (ref 11.5–15.5)

## 2010-06-12 LAB — COMPREHENSIVE METABOLIC PANEL
ALT: 13 U/L (ref 0–53)
AST: 28 U/L (ref 0–37)
Alkaline Phosphatase: 51 U/L (ref 39–117)
CO2: 27 mEq/L (ref 19–32)
Chloride: 100 mEq/L (ref 96–112)
GFR calc non Af Amer: 48 mL/min — ABNORMAL LOW (ref >60)
Potassium: 3.9 mEq/L (ref 3.5–5.1)
Sodium: 136 mEq/L (ref 135–145)
Total Bilirubin: 0.7 mg/dL (ref 0.3–1.2)

## 2010-06-12 LAB — PROTEIN ELECTROPH W RFLX QUANT IMMUNOGLOBULINS
Beta 2: 7.3 % — ABNORMAL HIGH (ref 3.2–6.5)
Beta Globulin: 5.1 % (ref 4.7–7.2)
Gamma Globulin: 14.6 % (ref 11.1–18.8)
M-Spike, %: NOT DETECTED g/dL
Total Protein ELP: 7 g/dL (ref 6.0–8.3)

## 2010-06-12 LAB — BASIC METABOLIC PANEL
BUN: 20 mg/dL (ref 6–23)
CO2: 27 mEq/L (ref 19–32)
GFR calc non Af Amer: 47 mL/min — ABNORMAL LOW (ref >60)
Glucose, Bld: 113 mg/dL — ABNORMAL HIGH (ref 70–99)
Potassium: 4.1 mEq/L (ref 3.5–5.1)

## 2010-06-12 LAB — URINALYSIS, ROUTINE W REFLEX MICROSCOPIC
Bilirubin Urine: NEGATIVE
Ketones, ur: NEGATIVE mg/dL
Nitrite: NEGATIVE
Urobilinogen, UA: 0.2 mg/dL (ref 0.0–1.0)
pH: 6 (ref 5.0–8.0)

## 2010-07-07 ENCOUNTER — Encounter: Payer: Self-pay | Admitting: Internal Medicine

## 2010-07-11 ENCOUNTER — Encounter: Payer: Self-pay | Admitting: Internal Medicine

## 2010-07-11 ENCOUNTER — Ambulatory Visit (INDEPENDENT_AMBULATORY_CARE_PROVIDER_SITE_OTHER): Payer: Medicare Other | Admitting: Internal Medicine

## 2010-07-11 DIAGNOSIS — I6529 Occlusion and stenosis of unspecified carotid artery: Secondary | ICD-10-CM

## 2010-07-11 DIAGNOSIS — M109 Gout, unspecified: Secondary | ICD-10-CM

## 2010-07-11 DIAGNOSIS — I714 Abdominal aortic aneurysm, without rupture, unspecified: Secondary | ICD-10-CM

## 2010-07-11 DIAGNOSIS — I779 Disorder of arteries and arterioles, unspecified: Secondary | ICD-10-CM

## 2010-07-11 DIAGNOSIS — E785 Hyperlipidemia, unspecified: Secondary | ICD-10-CM

## 2010-07-11 DIAGNOSIS — Z0000 Encounter for general adult medical examination without abnormal findings: Secondary | ICD-10-CM

## 2010-07-11 DIAGNOSIS — I10 Essential (primary) hypertension: Secondary | ICD-10-CM

## 2010-07-11 DIAGNOSIS — E119 Type 2 diabetes mellitus without complications: Secondary | ICD-10-CM

## 2010-07-11 DIAGNOSIS — N189 Chronic kidney disease, unspecified: Secondary | ICD-10-CM

## 2010-07-11 DIAGNOSIS — G4730 Sleep apnea, unspecified: Secondary | ICD-10-CM

## 2010-07-11 LAB — BASIC METABOLIC PANEL
CO2: 28 mEq/L (ref 19–32)
Calcium: 9.7 mg/dL (ref 8.4–10.5)
Creatinine, Ser: 1.3 mg/dL (ref 0.4–1.5)

## 2010-07-11 LAB — CBC WITH DIFFERENTIAL/PLATELET
Basophils Absolute: 0 10*3/uL (ref 0.0–0.1)
HCT: 38.5 % — ABNORMAL LOW (ref 39.0–52.0)
Hemoglobin: 13.3 g/dL (ref 13.0–17.0)
Lymphs Abs: 2 10*3/uL (ref 0.7–4.0)
MCHC: 34.5 g/dL (ref 30.0–36.0)
MCV: 100.1 fl — ABNORMAL HIGH (ref 78.0–100.0)
Monocytes Relative: 9 % (ref 3.0–12.0)
Neutro Abs: 5.8 10*3/uL (ref 1.4–7.7)
RDW: 14.6 % (ref 11.5–14.6)

## 2010-07-11 MED ORDER — OXYCODONE-ACETAMINOPHEN 7.5-500 MG PO TABS: 1.0000 | ORAL_TABLET | ORAL | Status: DC | PRN

## 2010-07-11 MED ORDER — GLUCOSE BLOOD VI STRP: ORAL_STRIP | Status: DC

## 2010-07-11 NOTE — Progress Notes (Signed)
Subjective:    Patient ID: Derrick Fry, male    DOB: 03/14/1927, 77 y.o.   MRN: [id number]  HPI Here for Medicare AWV: 1. Risk factors based on Past M, S, F history: reviewed 2. Physical Activities: yard and garden work regularly, inactive during the winter   3. Depression/mood:  No problems noted or reported  4. Hearing:  Corrected w/ hearing aids  5. ADL's:  Independent  6. Fall Risk: no recent falls  7. home Safety: does feel safe at home  8. Height, weight, &visual acuity: see VS, vision stable, sees eye doctor regulalrly 9. Counseling: provided 10. Labs ordered based on risk factors: if needed  11. Referral Coordination: if needed 12.  Care Plan, see assessment and plan  13.   Cognitive Assessment: motor skill and cognition appropriate for age.  In addition, today we discussed the following: CAD-- reports a stress test last year, ok Hyperlipidemia-- on dual therapy , no s/e good compliance  HTN--  amb BPs ok per pt  Gout--no recent episodes , still sees Dr Dareen Piano q 4 months    Past Medical History  Diagnosis Date  . CAD (coronary artery disease)     Dr.Tennant  . Myocardial infarct 08/04/85  . Hx of CABG 87    low risk stress tes 9/09  . History of echocardiogram     11/08, NL fx, decreased LV relazation & Dr.Tennant office  . Hyperlipidemia   . Peripheral neuropathy   . Hypertension   . OSA (obstructive sleep apnea) 2006    uses CPAP sometimes, limited tolerance  . Renal insufficiency     chronic w/ solitary kidney, congenital  . Depression   . Osteoarthritis   . Gout     diskitis 10/2008, Dr Dareen Piano  . Gait abnormality     chronic imbalance   Past Surgical History  Procedure Date  . Inguinal hernia repair 03/07/83  . Coronary artery bypass graft 01/04/86  . Total knee arthroplasty 05/04/89    left  . Cataract extraction 09/1999,04/2003    rt,left  . Knuckles replaced 05/2005    left hand  . Abdominal aortic aneurysm repair remote    w/ iliac aneurysm  repair    Family History  Problem Relation Age of Onset  . Lymphoma Sister   . Colon cancer Neg Hx   . Prostate cancer Neg Hx    History   Social History  . Marital Status: Married    Spouse Name: N/A    Number of Children: 2  . Years of Education: N/A   Occupational History  . retired    Social History Main Topics  . Smoking status: Never Smoker   . Smokeless tobacco: Not on file  . Alcohol Use: No  . Drug Use: No  . Sexually Active: Not on file   Other Topics Concern  . Not on file   Social History Narrative   Lost a son------Lives at home with wife-----Still drives-------      Review of Systems  Respiratory: Negative for cough and shortness of breath.   Cardiovascular: Negative for chest pain and leg swelling.  Gastrointestinal: Negative for abdominal pain and blood in stool.  Genitourinary: Negative for dysuria and hematuria. Difficulty urinating: occ feels his stream is slow.       Objective:   Physical Exam  Constitutional: He is oriented to person, place, and time. He appears well-developed and well-nourished.  HENT:  Head: Normocephalic and atraumatic.  Nose: Nose normal.  Neck:       Good bilateral carotid pulses  Cardiovascular: Normal rate, regular rhythm and normal heart sounds.   No murmur heard. Pulmonary/Chest: Effort normal and breath sounds normal. No respiratory distress. He has no wheezes. He has no rales.  Abdominal: Soft. Bowel sounds are normal. He exhibits no distension. There is no tenderness. There is no rebound.       barely palpable aorta in the upper abdomen. No bruit  Musculoskeletal: He exhibits no edema.  Neurological: He is alert and oriented to person, place, and time.  Psychiatric: He has a normal mood and affect. His behavior is normal. Judgment and thought content normal.          Assessment & Plan:

## 2010-07-11 NOTE — Assessment & Plan Note (Signed)
We are checking a BMP today 

## 2010-07-11 NOTE — Assessment & Plan Note (Signed)
Diet controlled, labs 

## 2010-07-11 NOTE — Assessment & Plan Note (Signed)
Well-controlled, no change 

## 2010-07-11 NOTE — Assessment & Plan Note (Addendum)
chart reviewed : Td 2010  pneumonia shot  2007 [facility]  DEXA 3-09 neg colonoscopy in 2005, report reviewed, next colonoscopy 2010. Dr. Madilyn Fireman; patient reports he got a call and was told that cscope was optional. Decided not to go for another one  DRE 03-2010 ---normal PSA was done  03-2010 Doing very well, diet, exercise and fall prevention discussed.

## 2010-07-11 NOTE — Assessment & Plan Note (Addendum)
Sees Dr. Dareen Piano routinely RF oxycontin, 2 tabs a day helps well

## 2010-07-11 NOTE — Assessment & Plan Note (Signed)
On today's physical exam, he has a barely palpable aorta. Check ultrasound

## 2010-07-11 NOTE — Assessment & Plan Note (Signed)
Had a cholesterol checked a few months ago at cardiology.

## 2010-07-12 ENCOUNTER — Ambulatory Visit
Admission: RE | Admit: 2010-07-12 | Discharge: 2010-07-12 | Disposition: A | Discharge status: Home / Self Care | Payer: Medicare Other | Source: Ambulatory Visit | Attending: Internal Medicine | Admitting: Internal Medicine

## 2010-07-12 DIAGNOSIS — I714 Abdominal aortic aneurysm, without rupture: Secondary | ICD-10-CM

## 2010-07-13 ENCOUNTER — Other Ambulatory Visit: Payer: Medicare Other

## 2010-07-19 NOTE — Assessment & Plan Note (Signed)
[facility]                                 ON-CALL NOTE   NAME:Derrick Fry, Derrick Fry                         MRN:          [id number]  DATE:10/17/2008                            DOB:          Jul 14, 1927    REGULAR DOCTOR:  Willow Ora, MD   ON CALL NOTE:  The patient's wife [name] calls on late Saturday  evening and the patient is in severe back pain.  At this point, he is on  the floor and unable to stand.  He has been in severe back pain during  most of the day, however, has worsened somewhat, and she believes that  he needs medical attention.  I did discuss with her that I think that  seems appropriate and that in their location the [facility] could likely adequately evaluate him.  Since he is essentially  completely immobile in the floor right now, I do not think if this can  wait.     Juleen China, MD    STC/MedQ  DD: 10/18/2008  DT: 10/18/2008  Job #: [id number]

## 2010-07-19 NOTE — Consult Note (Signed)
NAME:  Derrick Fry, Derrick Fry                  ACCOUNT NO.:  [id number]   MEDICAL RECORD NO.:  [id number]          PATIENT TYPE:  INP   LOCATION:  1517                         FACILITY:  [facility]   PHYSICIAN:  Georges Lynch. Gioffre, M.D.DATE OF BIRTH:  12-May-1927   DATE OF CONSULTATION:  10/19/2008  DATE OF DISCHARGE:                                 CONSULTATION   Derrick Fry was admitted to the hospital by Dr. Drue Novel with a history of  sudden onset of severe pain in his low back.  No pain radiates in his  buttocks or his lower extremities.  He was seen by me in the past, a few  months ago, for degenerative disk disease.  I have not seen him since.  He said this past Saturday, about 2-3 days ago, he started developing  pain in his back to the point where he could not get up and walk.  He  was taken by ambulance to [facility] and then released by  ambulance home.  He then was admitted here to the hospital on Sunday, 1  day ago, by Dr. Drue Novel.  I saw him this morning in consultation on October 19, 2008 and he is complaining of pain in his lower back only.  He had  difficulty moving about.  He had no trauma, he said, to his back;  this  came on rather suddenly again.  He had no nausea, no vomiting.  His  bowel movements been normal and he had no particular prostate issues,  except that it is just a little more difficult to start his urine now  than it has in the past.   PAST HISTORY:  1. He has an abdominal aortic aneurysm.  2. Arthritis.  3. Gout.  4. Hernia.  5. Hypertension.  6. Myocardial infarction, had open heart surgery.   ALLERGIES TO MEDICATIONS:  He said he was allergic to COLCHICINE, that  colchicine caused him to have diarrhea.  He took that recently for gout.   PHYSICAL EXAMINATION:  From the orthopedic standpoint, he had a marked  degree of difficulty turning from his back to his side for me to examine  his back.  He has no pain in the thoracic spine.  All his pain is  localized over  the lower lumbar region about the waist level.  No masses  or tumors noted about the back.  No CVA tenderness.  The abdominal exam  is negative and there is no sign of any gross aneurysm clinically on  abdominal exam.  His hips, he had no pain with hip motion.  The only  pain he had with hip motion was in his low back and not in the groin per  se.  He can roll his hips back and forth without any hip pain.  Muscle  testing exam of lower extremities was intact as well as I can examine  with the pain that he was in.  Sensory exam was intact.  He had good  pulses in his lower extremities.  He had bilateral knee arthroplasties.  Knee function was  fine.  His calves were fine.  There is no signs of any  deep venous thrombosis.   I did order some laboratory studies on him.  His hemoglobin was 12,  hematocrit 36.2, white count 11,400.  His chemistries were sodium 136,  potassium 3.9, chloride 100, BUN 20, creatinine 1.43, glucose 108.  His  liver functions were normal.  Urinalysis was negative.  The C-reactive  protein was MARKEDLY ELEVATED at 30.1.  His sed rate was elevated it was  65, upper limits of normal was 16.  The x-rays of his chest showed  chronic changes of the chest without any acute  Cardiopulmonary problems.  The lumbar spine x-ray just showed  degenerated disk disease.  No acute fractures.  Coccyx x-ray was  negative.  He had severe degenerative disk disease at L4-5.   IMPRESSION:  Low back pain, etiology deferred.   PLAN:  We are going to get an MRI of his back and also going to order a  total body bone scan on him to rule out any metastatic disease.  He was  placed on a pulse oximeter.  I am going to change his pain medicine and  put him on a Dilaudid PCA protocol.  Also will get a PSA test on him and  will start him on Lovenox subcu.           ______________________________  Georges Lynch. Darrelyn Hillock, M.D.     RAG/MEDQ  D:  10/19/2008  T:  10/19/2008  Job:  [id number]   cc:    Willow Ora, MD  [phone] [address] [place], [place] [address]

## 2010-07-19 NOTE — Discharge Summary (Signed)
NAME:  Derrick Fry, Derrick Fry                  ACCOUNT NO.:  [id number]   MEDICAL RECORD NO.:  [id number]          PATIENT TYPE:  INP   LOCATION:  1517                         [facility]ACILITY:  [facility]   PHYSICIAN:  Lonia Blood, M.D.       DATE OF BIRTH:  1927-05-15   DATE OF ADMISSION:  10/18/2008  DATE OF DISCHARGE:  10/23/2008                               DISCHARGE SUMMARY   PATIENT'S PRIMARY CARE PHYSICIAN:  Willow Ora, MD   DISCHARGE DIAGNOSES:  1. L4, L5, S1 diskitis due to gout.  2. Diabetes mellitus type 2 with worsening hyperglycemia due to      steroids.  3. Hypertension.  4. Hyperlipidemia.  5. Chronic kidney disease stage III.  6. Depression.  7. Solitary kidney congenital.  8. Coronary artery bypass grafting in 1987.  9. Osteoarthritis with total knee replacement.   DISCHARGE MEDICATIONS:  1. Prednisone 20 mg daily.  2. Lantus pen 5 units at bedtime.  3. Januvia 100 mg daily.  4. Lasix 40 mg daily.  5. Niacin 500 mg daily.  6. Atenolol 25 mg daily.  7. Simvastatin 80 mg at bedtime.  8. Aspirin 81 mg daily.  9. Fish oil 2000 mg daily.  10.Gabapentin 1200 mg in the morning and 800 mg at night.   CONDITION ON DISCHARGE:  Mr. [name] is discharged in good condition.  He  is able to ambulate with a walker without any severe pain.  He will  follow up with his primary care physician Dr. Drue Novel next week and he will  also follow up with rheumatology Dr. Azzie Roup.   PROCEDURE:  This admission the patient underwent an MRI of his lumbar  spine with findings of diskitis L4-L5, L5-S1.  CT-guided aspiration of  the disk space, findings of positive for intracellular and extracellular  crystals, negative for infectious process.   CONSULTATION:  This admission the patient was seen in consultation by  Dr. Darrelyn Hillock from orthopedics and Dr. Maurice March from infectious diseases.   HISTORY AND PHYSICAL:  Refer to the typed H and P done by Dr. Drue Novel which  is available in the chart.  Briefly, Mr. [name],  elderly gentleman with  gout, coronary artery disease and degenerative disk disease of the  lumbosacral spine presented to the emergency room with worsening back  pain, leukocytosis and low grade fever.  He was admitted for further  workup and observation.   HOSPITAL COURSE:  Upon admission Mr. [name] was in such significant pain  that he required PCA intravenous Dilaudid pump.  His workup revealed  presence of diskitis on the MRI and the disk space was aspirated with  results showing uric acid crystals.  The patient was started on  intravenous methylprednisolone with significant improvement of his  symptoms.  He was also empirically on antibiotics for 2 days but these  have been discontinued by now with negative cultures.  The patient will  follow up with rheumatologist and his primary care physician to assure  that the process continues to improve and also for regulation of his  steroid-induced hyperglycemia/new-onset  diabetes mellitus type 2.      Lonia Blood, M.D.  Electronically Signed     SL/MEDQ  D:  10/23/2008  T:  10/23/2008  Job:  [id number]   cc:   Willow Ora, MD  [phone] [address] [place], [place] [address]   Dr. Azzie Roup

## 2010-07-19 NOTE — Op Note (Signed)
NAME:  Derrick Fry, Derrick Fry                  ACCOUNT NO.:  [id number]   MEDICAL RECORD NO.:  [id number]          PATIENT TYPE:  AMB   LOCATION:  DSC                          FACILITY:  [facility]   PHYSICIAN:  Katy Fitch. Sypher, M.D. DATE OF BIRTH:  May 01, 1927   DATE OF PROCEDURE:  04/30/2007  DATE OF DISCHARGE:                               OPERATIVE REPORT   PREOPERATIVE DIAGNOSIS:  1. Large mucoid cyst adjacent to right thumb nail bed with secondary      nail deformity.  2. Extensive degenerative arthritis changes of right thumb IP joint      with multiple loose bodies and large marginal osteophytes on the      dorsal radial aspect of the proximal phalanx and distal phalanx.   POSTOPERATIVE DIAGNOSIS:  1. Large mucoid cyst adjacent to right thumb nail bed with secondary      nail deformity.  2. Extensive degenerative arthritis changes of right thumb IP joint      with multiple loose bodies and large marginal osteophytes on the      dorsal radial aspect of the proximal phalanx and distal phalanx.   OPERATION:  1. Right thumb IP joint arthrotomy with removal of multiple loose      bodies, synovectomy and debridement of marginal osteophytes from      base of distal phalanx and proximal phalangeal radial condyle.  2. Resection of subcutaneous periungual mucoid cyst decompressing the      nail fold.   SURGEON:  Katy Fitch. Sypher, M.D.   ASSISTANT:  Annye Rusk, P.A.-C.   ANESTHESIA:  General by LMA.   SUPERVISING ANESTHESIOLOGIST:  Sheldon Silvan, M.D.   INDICATIONS:  Derrick Fry is a 77 year old gentleman referred through the  courtesy of his primary care physician, Dr. Drue Novel, as well as Dr. Deborah Chalk  for evaluation and management of a large mucoid cyst of the right thumb  with severe arthritis of the right thumb IP joint.  Derrick Fry had a  radial deviation deformity of the IP joint.  He was developing a nail  deformity due to his large mucoid cyst.  We advised him of the nature of  his  predicament which was primarily a degenerative arthritis predicament  with secondary mucoid cyst formation.  Due to the nail deformity and  large cyst, he requested intervention.  We recommended DIP joint  debridement and resection of the cyst to decompress the nail fold.  After informed consent, he is brought to the operating room at this  time.   PROCEDURE:  Derrick Fry is brought to the operating room and placed in a  supine position upon the operating table.  Following an anesthesia  consult with Dr. Ivin Booty, general anesthesia by LMA technique was  recommended and accepted by Mr. Klayman.  He was brought to room 2, placed  in the supine position on the operating table, and under Dr. Ivin Booty  direct supervision, general anesthesia by LMA technique induced.  The  right arm was then prepped with Betadine soap solution and sterilely  draped.  A pneumatic tourniquet applied to the proximal  right brachium.  Following exsanguination of the right arm with an Esmarch bandage, the  arterial tourniquet was inflated to 220 mmHg.  The procedure commenced  with a curvilinear incision exposing the mucoid cyst and the radial  aspect of the right thumb IP joint.  The cyst was circumferentially  dissected, drained of its contents, and its membrane removed with a  microcurette.   The cyst was followed deep to the dorsal nail fold towards the radial  aspect of the IP joint.  An arthrotomy was created between the extensor  tendon and the radial collateral ligament.  Multiple large loose bodies  were removed from the synovium and the deep surface of extensor tendon.  The marginal osteophytes of the radial aspect the joint were debrided  aggressively with a microrongeur.  The joint was then irrigated  thoroughly with sterile saline and a dental needle with a syringe to  provide high pressure irrigation.  The joint was cleared of all  cartilaginous debris.  Hemostasis was achieved followed by repair of the   skin with multiple interrupted sutures of 5-0 nylon.  Mr. Snellgrove  tolerated the surgery and anesthesia well.  He was placed a compressive  dressing was Xeroflow, sterile gauze, and Coban.  He was returned to the  post anesthesia care area for observation of his vital signs.   We will see him back for follow up in our office in one week.  He is  provided prescriptions for Vicodin 5 mg 1 p.o. q.4-6h. p.r.n. pain, 20  tablets without refill.  Also, Keflex 500 mg one p.o. q.8h. x4 days as a  prophylactic antibiotic.      Katy Fitch Sypher, M.D.  Electronically Signed     RVS/MEDQ  D:  04/30/2007  T:  04/30/2007  Job:  [id number]   cc:   Colleen Can. Deborah Chalk, M.D.  Willow Ora, MD

## 2010-07-22 NOTE — Procedures (Signed)
NAME:  Derrick Fry, AUDI NO.:  [id number]   MEDICAL RECORD NO.:  [id number]          PATIENT TYPE:  OUT   LOCATION:  SLEEP CENTER                 FACILITY:  [facility]   PHYSICIAN:  Clinton D. Maple Hudson, M.D. DATE OF BIRTH:  Aug 22, 1927   DATE OF STUDY:  12/20/2003                              NOCTURNAL POLYSOMNOGRAM   REFERRING PHYSICIAN:  Lilly Cove, M.D.   INDICATION FOR STUDY:  Hypersomnia with sleep apnea.   EPWORTH SLEEPINESS SCORE:  12/24   NECK SIZE:  17 inches   BODY MASS INDEX:  27   WEIGHT:  190 pounds   SLEEP ARCHITECTURE:  Total sleep time was short at 284 minutes with sleep  efficiency 75%.  Sustained sleep onset did not occur until midnight.  Stage  I was 8%, stage II 64%, stages III and IV 11%, REM was 17% of total sleep  time.  Sleep latency was 19 minutes, REM latency was 84 minutes.  Awake  after sleep onset 76 minutes.  Arousal index 51 which is increased.   RESPIRATORY DATA:  Split-study protocol.  RDI 29.5/hr indicating moderate  obstructive sleep apnea/hypopnea syndrome before CPAP.  This included 24  obstructive apneas and 41 hypopneas before CPAP.  Events were not  positional.  REM RDI was 10/hr.  CPAP was titrated to 9 CWP, RDI 3.6/hr,  using a small Respironics ComfortGel Mask.   OXYGEN DATA:  Loud snoring with oxygen desaturation to a nadir of 87% before  CPAP.  Oxygen saturation after CPAP titration was 95-96% on room air.   CARDIAC DATA:  Normal sinus rhythm with PVCs.   MOVEMENT/PARASOMNIA:  Fourteen limb jerks were recorded with insignificant  impact on sleep quality.   IMPRESSION/RECOMMENDATION:  Moderately severe obstructive sleep  apnea/hypopnea syndrome, respiratory disturbance index 29.5/hr with  desaturation to 87%.  Successful continuous positive airway pressure  titration at 9 CWP, respiratory disturbance index 3.6/hr, using a small  Respironics ComfortGel Mask.  Sleep was additionally  fragmented with brief nonspecific  arousals not associated with obvious  physiologic events suggesting he may benefit from combination of a  sedative/hypnotic along with initial continuous positive airway pressure  trial.                                                           Clinton D. Maple Hudson, M.D.  Diplomate, [employer] Board   CDY/MEDQ  D:  12/27/2003 11:46:51  T:  12/27/2003 21:50:56  Job:  [id number]

## 2010-07-22 NOTE — Op Note (Signed)
NAME:  Derrick Fry, Derrick Fry                  ACCOUNT NO.:  [id number]   MEDICAL RECORD NO.:  [id number]          PATIENT TYPE:  AMB   LOCATION:  ENDO                         [facility]ACILITY:  [facility]   PHYSICIAN:  John C. Madilyn Fireman, M.D.    DATE OF BIRTH:  April 12, 1927   DATE OF PROCEDURE:  01/08/2004  DATE OF DISCHARGE:                                 OPERATIVE REPORT   PROCEDURE:  Colonoscopy.   INDICATIONS FOR PROCEDURE:  Average risk colon cancer screening.   DESCRIPTION OF PROCEDURE:  The patient was placed in the left lateral  decubitus position and placed on the pulse monitor with continuous low-flow  oxygen delivered by nasal cannula.  He was sedated with 50 mcg IV fentanyl  and 5 mg IV Versed.  The Olympus video colonoscope was inserted into the  rectum and advanced to the cecum, confirmed by transillumination at  McBurney's point and visualization of the ileocecal valve and appendiceal  orifice.  The prep was good.  The cecum, ascending, transverse, descending,  and sigmoid colon all appeared normal with no masses, polyps, diverticula,  or other mucosal abnormalities.  The rectum likewise appeared normal, and  retroflexed view of the anus revealed no obvious internal hemorrhoids.  The  scope was then withdrawn, and the patient returned to the recovery room in  stable condition.  He tolerated the procedure well, and there were no  immediate complications.   IMPRESSION:  Normal colonoscopy.   PLAN:  Next colon screening by sigmoidoscopy in 5 years.      JCH/MEDQ  D:  01/08/2004  T:  01/08/2004  Job:  [id number]   cc:   Wilson Singer, M.D.  [address], [address]  [place]  [place] [address]  Fax: [phone]

## 2010-07-22 NOTE — Op Note (Signed)
NAME:  Derrick Fry, Derrick Fry                  ACCOUNT NO.:  [id number]   MEDICAL RECORD NO.:  [id number]          PATIENT TYPE:  AMB   LOCATION:  DSC                          FACILITY:  [facility]   PHYSICIAN:  Katy Fitch. Sypher, M.D. 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   POSTOP DIAGNOSIS:  Chronic inflammatory arthritis consistent with rheumatoid  arthritis with multiple left hand deformities including type 1 Z collapse of  thumb (mild) and 4+ ulnar drift of index, long, ring and small fingers with  radial collateral ligament laxity, intrinsic tightness and loss of the  integrity of the radial sagittal fibers of the index, long, ring and small  fingers with inability to actively extend the left small finger.   OPERATION:  1.  Implant arthroplasty of right index finger metacarpal phalangeal joint      utilizing a size 30 DePuy implant with radial collateral ligament      reconstruction and extensor reconstruction.  2.  Implant arthroplasty left long finger metacarpal phalangeal joint      utilizing a size 30 DePuy implant with the radial collateral ligament      reconstruction and extensor reconstruction.  3.  Implant arthroplasty left ring finger utilizing a size 20 DePuy implant      with extensor reconstruction.  4.  Implant arthroplasty of left small finger metacarpal phalangeal joint      utilizing a size 10 DePuy implant with release of abductor digiti  minimi, ulnar intrinsic release in the manner of Littler with wing      resection of extensor tendon overlying proximal phalanx and      reconstruction of extensor digiti minimi and extensor digitorum longus      on dorsal aspect of small finger to prevent chronic ulnar drift and      flexion deformity.   OPERATING SURGEON:  Molly Maduro Sypher   ASSISTANT:  Molly Maduro Dasnoit PA-C.   ANESTHESIA:  General by LMA supplemented by a left axillary block,  supervising anesthesiologist is Dr. Katrinka Blazing   INDICATIONS:  Derrick Fry is a 77 year old gentleman referred through the  courtesy of Dr. Karilyn Cota for evaluation and management of chronic deformities  of the left hand.   Derrick Fry had chronic coronary artery disease and is status post coronary  artery bypass grafting 18 years prior. He is followed by Dr. Deborah Chalk from a  cardiology standpoint. He has been diagnosed to have sleep apnea and uses a  home CPAP machine. In addition he has experienced a progressive inflammatory  arthritis characterized by both degenerative and inflammatory  rheumatoid  type symptoms.   Derrick Fry has a type 1 Z collapse of his left thumb that is mild and  compensated. He has mild radial deviation of the wrist that is compensated  and significant ulnar drift, intrinsic tightness and extensor tendon  subluxation of his index, long, ring and small fingers on the left.   He was referred for possible elective hand reconstruction. After lengthy  informed consent in our office, he presents for evaluation of his hand at  this time.   Preoperatively he was screened with respect to his medical problems  including a notable serum glucose of greater than 300. He will require  careful management of his glucose,blood pressure, deep vein thrombosis  prophylaxis and perioperative antibiotics during our attempted  reconstruction of the left hand.   He was seen in consultation by Dr. Diamantina Monks for a detailed anesthesia  evaluation  preoperatively and had an axillary block placed for postoperative  analgesia in the holding area. He was provided 1 gram of Ancef as IV  prophylactic antibiotic and subsequently transferred to room 6 at the [facility] for definitive surgical reconstruction of his left  fingers.   PROCEDURE:  Derrick Fry is brought to operating room and placed in supine  position on operating table.   Following induction of general anesthesia by LMA, the left arm was prepped  with Betadine soap solution, sterilely draped. A pneumatic tourniquet  applied to the proximal left brachium. 1 gram of Ancef was administered as  IV prophylactic antibiotic 1 hour prior to surgery.   The left arm was exsanguinated with Esmarch bandage and the arterial  tourniquet inflated to 230 mmHg. A transverse incision was fashioned across  the necks of the index, long, ring and small finger metacarpals.  The  subcutaneous tissues were carefully divided taking care to spare the  longitudinal dorsal veins and sensory nerves. Transverse veins were  dissected and electrocauterized. The intrinsic contracture of the small  finger was released by release of the abductor digiti minimi tendon followed  by a Littler wing resection of the ulnar extensor overlying the proximal  phalanx. The ulnar sagittal fibers were released and plans were made to reef  the radial sagittal fibers.   The juncturae to the ring finger was released to allow realignment of the  ring and small finger extensors during the reconstruction of the extensor  mechanism at the conclusion of procedure.   In the index and long fingers, the extensors were split in the midline.  The  capsule was split in the midline.  The metacarpal heads exposed and with the  use of baby Bennett retractors the metacarpal heads were removed taking care  to spare about half of the radial collateral ligament origin. In the ring and small fingers radial collateral ligaments  were released to allow proper  soft tissue rebalancing. The ring and small finger metacarpal heads were  exposed by incision of the capsule in the longitudinal manner, use of a  Therapist, nutritional and small osteotomes to dissect the capsule off the  metacarpal necks, the metacarpals were all resected at the level of the  radial collateral ligament insertion and beveled slightly shorter on the  palmar side to facilitate complete flexion of the MP joints.   The proximal phalanges of the index, long, ring and small fingers were  prepared with a hand driven bone awl followed by both medium-sized and small  Swanson reamers preparing bony canals in the proximal  phalangeal bases for  size 30 implants in the index and long fingers, size 20 implant in the ring  finger and a size 10 implant in the small finger.  Metacarpal head and necks  were prepared by hand reaming with a awl and use of cutting rasps.   After trial placement of the appropriate size implants, the intermedullary  canals were meticulously irrigated with sterile saline followed by drilling  of holes in the index and long finger metacarpal necks dorsal radially to  reconstruct the radial collateral ligaments at the proximal origin.   The implants were placed in all four fingers with no-touch technique  followed by repair the capsules with mattress suture of 4-0 Vicryl. In the  index and long finger formal repair the radial collateral ligament was used  to realign the fingers in neutral position with slight supination. This  should facilitate postoperative pinch. The ring and small fingers had  excellent alignment after capsular release and proper implant placement.   The extensors were centralized through a combination of relaxation of the  ulnar sagittal fibers and reefing of the radial sagittal fibers for all four  fingers. The juncturae tendinea between the extensor digitorum longus of the  small finger and ring finger was  lengthened to allow proper alignment of the  ring finger extensor at the apex of the metacarpal phalangeal joint capsule.   Running sutures of 3-0 Ethibond was used to inset the extensor tendons along  the radial border of the radial sagittal fibers properly centralizing the  extensors for all fingers and the ulnar sagittal fibers were closed with  interrupted mattress sutures of 3-0 Ethilon.   Anatomic alignment of the index, long, ring and small finger MP joints was  achieved with complete relief of the intrinsic tightness noted  preoperatively.   The tourniquet released for a total tourniquet time of 1 hour and 50  minutes. There was immediate capillary refill in all fingers. The wounds  were then irrigated, vessel loop drains placed followed by repair the skin  with intradermal 3-0 segmental sutures. The wounds were dressed with  Adaptic, sterile gauze, Kerlix and neutralization gauze wraps to prevent  development of ulnar drift with swelling.  Dorsal and palmar plaster sandwich splints were applied to maintain the hand  at the safe position. There no apparent complications.   Derrick Fry tolerated surgery and anesthesia well. His blood pressure was  stable throughout procedure. He was awakened from general anesthesia and  transferred recovery with stable signs. His block was still in place.   He will be admitted to recovery care center for observation of his vital  signs, IV prophylactic antibiotics in the form of Ancef 1 gram IV q. 8 hours  x24 hours and analgesics in the form of IV  and p.o. Dilaudid.   There were no apparent complications. We will continue all of his routine  medications and Dr. Karilyn Cota including his diabetes management medications  and hypertension medications. We anticipate discharge in 24 hours.      Katy Fitch Sypher, M.D.  Electronically Signed     RVS/MEDQ  D:  05/09/2005  T:  05/10/2005  Job:  [id number]

## 2010-08-08 ENCOUNTER — Other Ambulatory Visit: Payer: Self-pay | Admitting: Internal Medicine

## 2010-08-08 MED ORDER — OXYCODONE-ACETAMINOPHEN 7.5-325 MG PO TABS: 1.0000 | ORAL_TABLET | ORAL | Status: DC | PRN

## 2010-08-08 NOTE — Telephone Encounter (Signed)
90, no RF 

## 2010-08-08 NOTE — Telephone Encounter (Signed)
Refill oxycondone / patient would like to pick up today

## 2010-08-16 ENCOUNTER — Ambulatory Visit (INDEPENDENT_AMBULATORY_CARE_PROVIDER_SITE_OTHER)
Admission: RE | Admit: 2010-08-16 | Discharge: 2010-08-16 | Disposition: A | Discharge status: Home / Self Care | Payer: Medicare Other | Source: Ambulatory Visit | Attending: Internal Medicine | Admitting: Internal Medicine

## 2010-08-16 ENCOUNTER — Ambulatory Visit (INDEPENDENT_AMBULATORY_CARE_PROVIDER_SITE_OTHER): Payer: Medicare Other | Admitting: Internal Medicine

## 2010-08-16 ENCOUNTER — Encounter: Payer: Self-pay | Admitting: Internal Medicine

## 2010-08-16 VITALS — BP 130/84 | HR 65 | Temp 98.2°F | Wt 178.8 lb

## 2010-08-16 DIAGNOSIS — L089 Local infection of the skin and subcutaneous tissue, unspecified: Secondary | ICD-10-CM

## 2010-08-16 MED ORDER — AMOXICILLIN-POT CLAVULANATE 875-125 MG PO TABS: 1.0000 | ORAL_TABLET | Freq: Two times a day (BID) | ORAL | Status: AC

## 2010-08-16 NOTE — Patient Instructions (Signed)
augmentin XRs Will ask orthopedic surgery to see you Elevate leg Call if redness spreads or no better in few days

## 2010-08-16 NOTE — Assessment & Plan Note (Signed)
Diabetic patient w/ evidence of a toe infection. Vascular exam normal Plan: Augmentin, XR, will ask ortho to f/u

## 2010-08-16 NOTE — Progress Notes (Signed)
  Subjective:    Patient ID: Derrick Fry, male    DOB: 10-14-1927, 77 y.o.   MRN: [id number]  HPI 2 weeks ago noticed swelling and discoloration of the   third left toe   Past Medical History  Diagnosis Date  . CAD (coronary artery disease)     Dr.Tennant  . Myocardial infarct 08/04/85  . Hx of CABG 87    low risk stress tes 9/09  . History of echocardiogram     11/08, NL fx, decreased LV relazation & Dr.Tennant office  . Hyperlipidemia   . Peripheral neuropathy   . Hypertension   . OSA (obstructive sleep apnea) 2006    uses CPAP sometimes, limited tolerance  . Renal insufficiency     chronic w/ solitary kidney, congenital  . Depression   . Osteoarthritis   . Gout     diskitis 10/2008, Dr Dareen Piano  . Gait abnormality     chronic imbalance   Past Surgical History  Procedure Date  . Inguinal hernia repair 03/07/83  . Coronary artery bypass graft 01/04/86  . Total knee arthroplasty 05/04/89    left  . Cataract extraction 09/1999,04/2003    rt,left  . Knuckles replaced 05/2005    left hand  . Abdominal aortic aneurysm repair remote    w/ iliac aneurysm repair     Review of Systems Denies injury at the toe. No pain or discharge from the toe No fever      Objective:   Physical Exam Alert, oriented x3. Right foot essentially normal Left foot: Good femoral pulses and good capillary refill. No maceration between the toes. The third toe is purplish at the tip, skin is thick and has a callus. The whole toe is slightly swollen but not tender or hot.         Assessment & Plan:

## 2010-08-18 ENCOUNTER — Telehealth: Payer: Self-pay | Admitting: *Deleted

## 2010-08-18 NOTE — Telephone Encounter (Signed)
Message copied by Leanne Lovely on Thu Aug 18, 2010  9:54 AM ------      Message from: Derrick Fry      Created: Wed Aug 17, 2010  6:09 PM       Advise patient:      Question of the infection affecting the bone. Continue with antibiotics. Be sure patient has a followup with orthopedic surgery within few days, fax report to ortho

## 2010-08-18 NOTE — Telephone Encounter (Signed)
Unable to leave message

## 2010-08-19 NOTE — Telephone Encounter (Signed)
Pt is aware.  

## 2010-08-26 ENCOUNTER — Telehealth: Payer: Self-pay | Admitting: *Deleted

## 2010-08-26 NOTE — Telephone Encounter (Signed)
thx

## 2010-08-26 NOTE — Telephone Encounter (Signed)
Message copied by Leanne Lovely on Fri Aug 26, 2010  9:15 AM ------      Message from: Army Fossa R      Created: Thu Aug 18, 2010  1:17 PM                   ----- Message -----         From: Wanda Plump, MD         Sent: 08/17/2010   6:09 PM           To: Army Fossa, CMA            Check on pt, did he see ortho? Toe better ?

## 2010-08-26 NOTE — Telephone Encounter (Signed)
I spoke w/ pt he states he saw ortho last week, toe is gradually getting better. He is having an MRI on Tuesday.

## 2010-09-22 ENCOUNTER — Other Ambulatory Visit: Payer: Self-pay | Admitting: Internal Medicine

## 2010-09-22 MED ORDER — OXYCODONE-ACETAMINOPHEN 7.5-500 MG PO TABS: 1.0000 | ORAL_TABLET | ORAL | Status: DC | PRN

## 2010-09-22 NOTE — Telephone Encounter (Signed)
What strength should pt be on? I see several strengths on his med list.

## 2010-09-22 NOTE — Telephone Encounter (Signed)
Will place up front

## 2010-09-22 NOTE — Telephone Encounter (Signed)
Ok to leave message--he will pick up on Friday 7/20

## 2010-09-22 NOTE — Telephone Encounter (Signed)
Same as centricity, #90, no RF

## 2010-10-11 ENCOUNTER — Encounter: Payer: Self-pay | Admitting: Nurse Practitioner

## 2010-10-20 ENCOUNTER — Ambulatory Visit (INDEPENDENT_AMBULATORY_CARE_PROVIDER_SITE_OTHER): Payer: Medicare Other | Admitting: Nurse Practitioner

## 2010-10-20 ENCOUNTER — Encounter: Payer: Self-pay | Admitting: Nurse Practitioner

## 2010-10-20 VITALS — BP 158/72 | HR 56 | Ht 68.0 in | Wt 180.0 lb

## 2010-10-20 DIAGNOSIS — E785 Hyperlipidemia, unspecified: Secondary | ICD-10-CM

## 2010-10-20 DIAGNOSIS — I2510 Atherosclerotic heart disease of native coronary artery without angina pectoris: Secondary | ICD-10-CM

## 2010-10-20 DIAGNOSIS — I10 Essential (primary) hypertension: Secondary | ICD-10-CM

## 2010-10-20 NOTE — Patient Instructions (Signed)
Stay on your current medicines. We will see you back in 6 months. You will see Dr. Elease Hashimoto at your next visit. Call for any problems.

## 2010-10-20 NOTE — Assessment & Plan Note (Signed)
Has had recent labs with the [place] system.

## 2010-10-20 NOTE — Progress Notes (Signed)
Derrick Fry Date of Birth: 1927/07/15   History of Present Illness: Derrick Fry is seen today for his 6 month check. He is seen for Dr. Elease Hashimoto. Overall, he is doing good. No chest pain or shortness of breath. His blood pressure at home is good and runs in the 120 systolic range. He is tolerating his medicines. Last nuclear was in 2011. He has had labs with the [place].   Current Outpatient Prescriptions on File Prior to Visit  Medication Sig Dispense Refill  . aspirin 81 MG tablet Take 81 mg by mouth daily.        Marland Kitchen atenolol (TENORMIN) 25 MG tablet Take 25 mg by mouth daily.        . cyanocobalamin 100 MCG tablet Take 100 mcg by mouth daily.        . febuxostat (ULORIC) 40 MG tablet Take 80 mg by mouth daily.        . fish oil-omega-3 fatty acids 1000 MG capsule Take 2 g by mouth daily.        . Flaxseed, Linseed, (FLAXSEED OIL) 1000 MG CAPS Take by mouth.        . furosemide (LASIX) 40 MG tablet Take 40 mg by mouth daily.        Marland Kitchen gabapentin (NEURONTIN) 400 MG capsule 3 every am, 2 every pm.       . glucosamine-chondroitin 500-400 MG tablet Take 1 tablet by mouth 3 (three) times daily.        Marland Kitchen glucose blood test strip Use as instructed  100 each  12  . Multiple Vitamins-Minerals (CENTRUM SILVER) tablet Take 1 tablet by mouth daily.        . niacin 500 MG tablet Take 500 mg by mouth at bedtime.        . polyethylene glycol (MIRALAX / GLYCOLAX) packet Take 17 g by mouth as needed.       . predniSONE (DELTASONE) 5 MG tablet 0.5 tab by mouth every am       . simvastatin (ZOCOR) 80 MG tablet Take 80 mg by mouth at bedtime.        Marland Kitchen oxyCODONE-acetaminophen (ENDOCET) 7.5-500 MG per tablet Take 1 tablet by mouth every 4 (four) hours as needed for pain.  90 tablet  0  . oxyCODONE-acetaminophen (ENDOCET) 7.5-500 MG per tablet Take 1 tablet by mouth every 4 (four) hours as needed for pain.  90 tablet  0  . oxyCODONE-acetaminophen (PERCOCET) 7.5-325 MG per tablet Take 1 tablet by mouth every 4 (four) hours  as needed for pain.  90 tablet  0  . oxyCODONE-acetaminophen (PERCOCET) 7.5-500 MG per tablet Take 1 tablet by mouth every 4 (four) hours as needed for pain.  60 tablet  0    Allergies  Allergen Reactions  . Colchicine     Past Medical History  Diagnosis Date  . CAD (coronary artery disease)     Dr.Tennant  . Myocardial infarct 08/04/85  . Hx of CABG 87    low risk stress test 8/11  . History of echocardiogram     11/08, NL fx, decreased LV relazation & Dr.Tennant office  . Hyperlipidemia   . Peripheral neuropathy   . Hypertension   . OSA (obstructive sleep apnea) 2006    uses CPAP sometimes, limited tolerance  . Renal insufficiency     chronic w/ solitary kidney, congenital  . Depression   . Osteoarthritis   . Gout     diskitis 10/2008,  Dr Dareen Piano  . Gait abnormality     chronic imbalance  . Bradycardia   . Palpitations   . Gout   . AAA (abdominal aortic aneurysm)   . Pneumonia     had right pneumonia pleurisy requiring resection of ribs and chest tube drainage at age 16    Past Surgical History  Procedure Date  . Inguinal hernia repair 03/07/83  . Coronary artery bypass graft 01/04/86  . Total knee arthroplasty 05/04/89    left  . Cataract extraction 09/1999,04/2003    rt,left  . Knuckles replaced 05/2005    left hand  . Abdominal aortic aneurysm repair remote    w/ iliac aneurysm repair  . Abdominal aortic aneurysm repair   . Nephrectomy 1996  . US echocardiography 01/14/2007    EF 55-60%  . Cardiovascular stress test 10/13/2009    EF 57%    History  Smoking status  . Never Smoker   Smokeless tobacco  . Not on file    History  Alcohol Use No    former heavy alcohol use    Family History  Problem Relation Age of Onset  . Lymphoma Sister   . Colon cancer Neg Hx   . Prostate cancer Neg Hx     Review of Systems: The review of systems is positive for easy bruising. He is on chronic steroids. No chest pain. No real dyspnea. He is not dizzy or  lightheaded. He has chronic arthritis with back and joint pain. Walking is difficult.  All other systems were reviewed and are negative.  Physical Exam: BP 158/72  Pulse 56  Ht 5\' 8"  (1.727 m)  Wt 180 lb (81.647 kg)  BMI 27.37 kg/m2 Patient is very pleasant and in no acute distress. Skin is warm and dry. Color is normal. He does have some mild ecchymosis of his arms.  HEENT is unremarkable. Normocephalic/atraumatic. PERRL. Sclera are nonicteric. Neck is supple. No masses. No JVD. Lungs are clear. Cardiac exam shows a regular rate and rhythm. Abdomen is soft. Extremities are without edema. Gait and ROM are intact. No gross neurologic deficits noted.  LABORATORY DATA:   Assessment / Plan:

## 2010-10-20 NOTE — Assessment & Plan Note (Signed)
He continues to do well and is asymptomatic. He is up to date with stress testing. No change in his medicines. We will see him back in 6 months. Patient is agreeable to this plan and will call if any problems develop in the interim.

## 2010-10-20 NOTE — Assessment & Plan Note (Signed)
Blood pressure is good at home. No change in his medicines. He will continue to monitor at home.

## 2010-10-28 ENCOUNTER — Other Ambulatory Visit: Payer: Self-pay | Admitting: Internal Medicine

## 2010-10-28 NOTE — Telephone Encounter (Signed)
Oxycodone request [last refill was printed on 09/22/10 #90x0]

## 2010-10-31 MED ORDER — OXYCODONE-ACETAMINOPHEN 7.5-500 MG PO TABS: 1.0000 | ORAL_TABLET | Freq: Every day | ORAL | Status: DC

## 2010-10-31 NOTE — Telephone Encounter (Signed)
Ok 90, no RF 

## 2010-10-31 NOTE — Telephone Encounter (Signed)
Left msg rx ready for pickup

## 2010-11-25 LAB — BASIC METABOLIC PANEL
Calcium: 10.1
GFR calc Af Amer: >60
GFR calc non Af Amer: 52 — ABNORMAL LOW
Glucose, Bld: 170 — ABNORMAL HIGH
Potassium: 4.6
Sodium: 139

## 2010-11-29 ENCOUNTER — Other Ambulatory Visit: Payer: Self-pay | Admitting: Internal Medicine

## 2010-11-29 NOTE — Telephone Encounter (Signed)
Refill oxycodone apap 7.5-500 mg - patient will pick up Wednesday [id number]

## 2010-11-29 NOTE — Telephone Encounter (Signed)
Request Oxycodone-APAP 7/5-500 [last refill 10/31/10 #30x0]

## 2010-11-29 NOTE — Telephone Encounter (Signed)
Ok 90, no RF 

## 2010-11-30 MED ORDER — OXYCODONE-ACETAMINOPHEN 7.5-500 MG PO TABS: 1.0000 | ORAL_TABLET | Freq: Every day | ORAL | Status: DC

## 2010-11-30 NOTE — Telephone Encounter (Signed)
Done

## 2011-01-12 ENCOUNTER — Encounter: Payer: Self-pay | Admitting: Internal Medicine

## 2011-01-12 ENCOUNTER — Ambulatory Visit (INDEPENDENT_AMBULATORY_CARE_PROVIDER_SITE_OTHER): Payer: Medicare Other | Admitting: Internal Medicine

## 2011-01-12 VITALS — BP 118/60 | HR 76 | Temp 98.2°F | Resp 18 | Ht 66.93 in | Wt 179.2 lb

## 2011-01-12 DIAGNOSIS — M109 Gout, unspecified: Secondary | ICD-10-CM

## 2011-01-12 DIAGNOSIS — E119 Type 2 diabetes mellitus without complications: Secondary | ICD-10-CM

## 2011-01-12 DIAGNOSIS — E785 Hyperlipidemia, unspecified: Secondary | ICD-10-CM

## 2011-01-12 DIAGNOSIS — N509 Disorder of male genital organs, unspecified: Secondary | ICD-10-CM

## 2011-01-12 DIAGNOSIS — N5082 Scrotal pain: Secondary | ICD-10-CM | POA: Insufficient documentation

## 2011-01-12 DIAGNOSIS — I10 Essential (primary) hypertension: Secondary | ICD-10-CM

## 2011-01-12 DIAGNOSIS — I714 Abdominal aortic aneurysm, without rupture: Secondary | ICD-10-CM

## 2011-01-12 DIAGNOSIS — I2510 Atherosclerotic heart disease of native coronary artery without angina pectoris: Secondary | ICD-10-CM

## 2011-01-12 LAB — HEMOGLOBIN A1C: Hgb A1c MFr Bld: 6.2 % (ref 4.6–6.5)

## 2011-01-12 LAB — BASIC METABOLIC PANEL
BUN: 36 mg/dL — ABNORMAL HIGH (ref 6–23)
Chloride: 102 mEq/L (ref 96–112)
GFR: 44.55 mL/min — ABNORMAL LOW (ref >60.00)
Potassium: 3.9 mEq/L (ref 3.5–5.1)
Sodium: 141 mEq/L (ref 135–145)

## 2011-01-12 LAB — LIPID PANEL
Cholesterol: 166 mg/dL (ref 0–200)
HDL: 52.7 mg/dL (ref >39.00)
Triglycerides: 128 mg/dL (ref 0.0–149.0)

## 2011-01-12 MED ORDER — OXYCODONE-ACETAMINOPHEN 7.5-500 MG PO TABS: 1.0000 | ORAL_TABLET | Freq: Every day | ORAL | Status: DC

## 2011-01-12 MED ORDER — OXYCODONE-ACETAMINOPHEN 7.5-500 MG PO TABS: 1.0000 | ORAL_TABLET | Freq: Three times a day (TID) | ORAL | Status: DC | PRN

## 2011-01-12 NOTE — Assessment & Plan Note (Signed)
good medication compliance Labs  

## 2011-01-12 NOTE — Assessment & Plan Note (Signed)
Neg u/s 5-12

## 2011-01-12 NOTE — Assessment & Plan Note (Signed)
At goal, labs  

## 2011-01-12 NOTE — Progress Notes (Signed)
  Subjective:    Patient ID: Derrick Fry, male    DOB: 1927-08-06, 77 y.o.   MRN: [id number]  HPI Routine office visit Heart disease--note from cardiology visit 2 months ago reviewed, he is stable. Diabetes--check his blood sugars infrequently but they are always within normal Gout --reports that he recently saw rheumatology, stable, still on prednisone. We have refilled his pain medicine High cholesterol--good medication compliance Scrotal pain --for the last month, he has noted some discomfort at the left scrotum, usually when he gets in and out of a car or if he cross his leg. See review of systems.  . Past Medical History  Diagnosis Date  . CAD (coronary artery disease)     Dr.Tennant  . Myocardial infarct 08/04/85  . Hx of CABG 87    low risk stress test 8/11  . History of echocardiogram     11/08, NL fx, decreased LV relazation & Dr.Tennant office  . Hyperlipidemia   . Peripheral neuropathy   . Hypertension   . OSA (obstructive sleep apnea) 2006    uses CPAP sometimes, limited tolerance  . Renal insufficiency     chronic w/ solitary kidney, congenital  . Depression   . Osteoarthritis   . Gout     diskitis 10/2008, Dr Dareen Piano  . Gait abnormality     chronic imbalance  . Bradycardia   . Palpitations   . Gout   . AAA (abdominal aortic aneurysm)   . Pneumonia     had right pneumonia pleurisy requiring resection of ribs and chest tube drainage at age 37   Past Surgical History  Procedure Date  . Inguinal hernia repair 03/07/83  . Coronary artery bypass graft 01/04/86  . Total knee arthroplasty 05/04/89    left  . Cataract extraction 09/1999,04/2003    rt,left  . Knuckles replaced 05/2005    left hand  . Abdominal aortic aneurysm repair remote    w/ iliac aneurysm repair  . Abdominal aortic aneurysm repair   . Nephrectomy 1996  . US echocardiography 01/14/2007    EF 55-60%  . Cardiovascular stress test 10/13/2009    EF 57%      Review of Systems No chest pain or  shortness of breath No nausea, vomiting, diarrhea Denies any dysuria, difficulty urinating or gross hematuria. Self testicular exam showed that the left testicle is slightly larger than usual but not painful.    Objective:   Physical Exam  Constitutional: He is oriented to person, place, and time. He appears well-developed and well-nourished.  HENT:  Head: Normocephalic and atraumatic.  Cardiovascular: Normal rate and regular rhythm.   No murmur heard. Pulmonary/Chest: Effort normal and breath sounds normal. No respiratory distress. He has no wheezes. He has no rales.  Genitourinary:       Penis normal. Scrotal contents, testicles without mass, left slightly larger, nontender. The spermatic cord on the left is definitely larger, nontender or nodular. No inguinal hernias noted   Musculoskeletal: He exhibits no edema.  Neurological: He is alert and oriented to person, place, and time.          Assessment & Plan:

## 2011-01-12 NOTE — Assessment & Plan Note (Signed)
Seems to be doing well, labs

## 2011-01-12 NOTE — Assessment & Plan Note (Signed)
Scrotal discomfort, the spermatic cord on the left is enlarged. We'll check an ultrasound

## 2011-01-12 NOTE — Assessment & Plan Note (Signed)
asx , doing well 

## 2011-01-12 NOTE — Assessment & Plan Note (Addendum)
Saw rhematology, doing well, needs Rx for pain meds He is on chronid steroids,  DEXA neg 3-09 and 08-2009 per chart review

## 2011-01-13 ENCOUNTER — Encounter: Payer: Self-pay | Admitting: Internal Medicine

## 2011-01-13 ENCOUNTER — Other Ambulatory Visit: Payer: Self-pay | Admitting: Internal Medicine

## 2011-01-13 DIAGNOSIS — N5082 Scrotal pain: Secondary | ICD-10-CM

## 2011-01-16 ENCOUNTER — Other Ambulatory Visit: Payer: Medicare Other

## 2011-01-16 ENCOUNTER — Ambulatory Visit
Admission: RE | Admit: 2011-01-16 | Discharge: 2011-01-16 | Disposition: A | Discharge status: Home / Self Care | Payer: Medicare Other | Source: Ambulatory Visit | Attending: Internal Medicine | Admitting: Internal Medicine

## 2011-01-16 DIAGNOSIS — N5082 Scrotal pain: Secondary | ICD-10-CM

## 2011-01-17 ENCOUNTER — Telehealth: Payer: Self-pay

## 2011-01-17 NOTE — Telephone Encounter (Signed)
Informed of results copy of results mailed to address

## 2011-01-17 NOTE — Telephone Encounter (Signed)
Message copied by Francisco Capuchin on Tue Jan 17, 2011  8:28 AM ------      Message from: Wanda Plump      Created: Tue Jan 17, 2011  8:14 AM       Advise patient:      U/s wnl

## 2011-01-17 NOTE — Telephone Encounter (Signed)
Pt advised Kidney function slightly decreased, will recheck when he comes back in 6 months. Likely will also need a ultrasound of the kidney. Diabetes and cholesterol are well controlled Please also recommend patient not to take any Motrin or Advil----> just one aspirin 81 mg a day is ok  Pt verbalized understanding and copy mailed

## 2011-02-08 ENCOUNTER — Other Ambulatory Visit: Payer: Self-pay | Admitting: Internal Medicine

## 2011-02-22 ENCOUNTER — Other Ambulatory Visit: Payer: Self-pay | Admitting: Internal Medicine

## 2011-02-22 MED ORDER — ONETOUCH ULTRASOFT LANCETS MISC: Status: AC

## 2011-02-22 NOTE — Telephone Encounter (Signed)
Rx sent 

## 2011-03-06 ENCOUNTER — Other Ambulatory Visit: Payer: Self-pay | Admitting: Internal Medicine

## 2011-03-06 NOTE — Telephone Encounter (Signed)
refill onxycodone - patient will pick up Thursday [phone]

## 2011-03-06 NOTE — Telephone Encounter (Signed)
Last filled 01/12/11 # 90. Please advise    KP

## 2011-03-09 MED ORDER — OXYCODONE-ACETAMINOPHEN 7.5-500 MG PO TABS: 1.0000 | ORAL_TABLET | Freq: Three times a day (TID) | ORAL | Status: DC | PRN

## 2011-03-09 NOTE — Telephone Encounter (Signed)
Left Pt detail message, Rx ready placed up front for pick up.

## 2011-04-20 ENCOUNTER — Encounter: Payer: Self-pay | Admitting: Cardiovascular Disease

## 2011-04-20 ENCOUNTER — Ambulatory Visit (INDEPENDENT_AMBULATORY_CARE_PROVIDER_SITE_OTHER): Payer: Medicare Other | Admitting: Cardiovascular Disease

## 2011-04-20 VITALS — BP 135/65 | HR 57 | Ht 68.0 in | Wt 180.0 lb

## 2011-04-20 DIAGNOSIS — I10 Essential (primary) hypertension: Secondary | ICD-10-CM

## 2011-04-20 DIAGNOSIS — E785 Hyperlipidemia, unspecified: Secondary | ICD-10-CM

## 2011-04-20 DIAGNOSIS — I2510 Atherosclerotic heart disease of native coronary artery without angina pectoris: Secondary | ICD-10-CM

## 2011-04-20 DIAGNOSIS — I714 Abdominal aortic aneurysm, without rupture: Secondary | ICD-10-CM

## 2011-04-20 LAB — BASIC METABOLIC PANEL
Chloride: 101 mEq/L (ref 96–112)
Potassium: 3.9 mEq/L (ref 3.5–5.1)
Sodium: 138 mEq/L (ref 135–145)

## 2011-04-20 LAB — HEPATIC FUNCTION PANEL
ALT: 19 U/L (ref 0–53)
Albumin: 3.7 g/dL (ref 3.5–5.2)
Total Protein: 7.3 g/dL (ref 6.0–8.3)

## 2011-04-20 LAB — LIPID PANEL
Cholesterol: 115 mg/dL (ref 0–200)
HDL: 44.2 mg/dL (ref >39.00)
LDL Cholesterol: 50 mg/dL (ref 0–99)
Triglycerides: 103 mg/dL (ref 0.0–149.0)
VLDL: 20.6 mg/dL (ref 0.0–40.0)

## 2011-04-20 NOTE — Patient Instructions (Signed)
Your physician wants you to follow-up in: 1 YEAR You will receive a reminder letter in the mail two months in advance. If you don't receive a letter, please call our office to schedule the follow-up appointment.  Your physician recommends that you return for a FASTING lipid profile: TODAY AND IN A YEAR  Your physician has recommended you make the following change in your medication:   SIMVASTATIN WAS REMOVED FROM YOUR LIST/ IT WAS LISTED IN ERROR OR WAS NOT REMOVED WHEN MEDICATION WAS SWITCHED.

## 2011-04-20 NOTE — Assessment & Plan Note (Signed)
Derrick Fry is doing well.  Continue current meds.  Fasting labs today and again in 1 year.

## 2011-04-20 NOTE — Assessment & Plan Note (Signed)
His BP is pretty well controlled.

## 2011-04-20 NOTE — Progress Notes (Signed)
Derrick Fry Date of Birth  Jul 29, 1927 [facility]     [place] Office  [address]    [address]   [address] [place], [place]  [address]    [place], [place]  [address] [phone]  Fax  [phone]  [phone]  Fax [phone]  Problem List: 1. CAD - s/p CABG ( 1987, [place]) 2.  AAA - repair 3. Hypertension 4. Hyperlipidemia    History of Present Illness:  Derrick Fry is an 78 y.o. gentleman with the above noted hx.  He does not exercise much because of his arthritis.  He is active in the summertime.   Current Outpatient Prescriptions on File Prior to Visit  Medication Sig Dispense Refill  . aspirin 81 MG tablet Take 81 mg by mouth daily.        Marland Kitchen atenolol (TENORMIN) 25 MG tablet Take 25 mg by mouth daily.        . cyanocobalamin 100 MCG tablet Take 100 mcg by mouth daily.        . febuxostat (ULORIC) 40 MG tablet Take 80 mg by mouth daily.        . fish oil-omega-3 fatty acids 1000 MG capsule Take 2 g by mouth daily.        . Flaxseed, Linseed, (FLAXSEED OIL) 1000 MG CAPS Take by mouth.        . furosemide (LASIX) 40 MG tablet Take 40 mg by mouth daily.        Marland Kitchen gabapentin (NEURONTIN) 400 MG capsule 3 every am, 2 every pm.       . glucosamine-chondroitin 500-400 MG tablet Take 1 tablet by mouth 3 (three) times daily.       . Lancets (ONETOUCH ULTRASOFT) lancets PRN  60 each  5  . Multiple Vitamins-Minerals (CENTRUM SILVER) tablet Take 1 tablet by mouth daily.        . niacin 500 MG tablet Take 500 mg by mouth at bedtime.        . ONE TOUCH ULTRA TEST test strip USE ONE EVERY DAY  100 each  2  . polyethylene glycol (MIRALAX / GLYCOLAX) packet Take 17 g by mouth as needed.       . predniSONE (DELTASONE) 5 MG tablet 0.5 tab by mouth every am       . rosuvastatin (CRESTOR) 20 MG tablet Take 20 mg by mouth daily.      Marland Kitchen oxyCODONE-acetaminophen (ENDOCET) 7.5-500 MG per tablet Take 1 tablet by mouth every 4 (four) hours as needed for pain.  90 tablet  0  .  oxyCODONE-acetaminophen (ENDOCET) 7.5-500 MG per tablet Take 1 tablet by mouth every 4 (four) hours as needed for pain.  90 tablet  0  . oxyCODONE-acetaminophen (PERCOCET) 7.5-325 MG per tablet Take 1 tablet by mouth every 4 (four) hours as needed for pain.  90 tablet  0  . oxyCODONE-acetaminophen (PERCOCET) 7.5-500 MG per tablet Take 1 tablet by mouth every 4 (four) hours as needed for pain.  60 tablet  0  . simvastatin (ZOCOR) 80 MG tablet Take 80 mg by mouth at bedtime.       Marland Kitchen DISCONTD: oxyCODONE-acetaminophen (PERCOCET) 7.5-500 MG per tablet Take 1 tablet by mouth every 8 (eight) hours as needed for pain.  90 tablet  0    Allergies  Allergen Reactions  . Colchicine     Past Medical History  Diagnosis Date  . CAD (coronary artery disease)   . Myocardial infarct  08/04/85  . Hx of CABG 87    low risk stress test 8/11  . History of echocardiogram     11/08, NL fx, decreased LV relazation  . Hyperlipidemia   . Peripheral neuropathy   . Hypertension   . Renal insufficiency     chronic w/ solitary kidney, congenital  . Depression   . Osteoarthritis   . Gout     diskitis 10/2008, Dr Dareen Piano  . Gait abnormality     chronic imbalance  . Bradycardia   . Palpitations   . Gout   . AAA (abdominal aortic aneurysm)   . Pneumonia     had right pneumonia pleurisy requiring resection of ribs and chest tube drainage at age 57    Past Surgical History  Procedure Date  . Inguinal hernia repair 03/07/83  . Coronary artery bypass graft 01/04/86  . Total knee arthroplasty 05/04/89    left  . Cataract extraction 09/1999,04/2003    rt,left  . Knuckles replaced 05/2005    left hand  . Abdominal aortic aneurysm repair remote    w/ iliac aneurysm repair  . Abdominal aortic aneurysm repair   . Nephrectomy 1996  . US echocardiography 01/14/2007    EF 55-60%  . Cardiovascular stress test 10/13/2009    EF 57%    History  Smoking status  . Never Smoker   Smokeless tobacco  . Not on file     History  Alcohol Use No    former heavy alcohol use    Family History  Problem Relation Age of Onset  . Lymphoma Sister   . Colon cancer Neg Hx   . Prostate cancer Neg Hx     Reviw of Systems:  Reviewed in the HPI.  All other systems are negative.  Physical Exam: Blood pressure 135/65, pulse 57, height 5\' 8"  (1.727 m), weight 180 lb (81.647 kg). General: Well developed, well nourished, in no acute distress.  Head: Normocephalic, atraumatic, sclera non-icteric, mucus membranes are moist,   Neck: Supple. Negative for carotid bruits. JVD not elevated.  Lungs: Clear bilaterally to auscultation without wheezes, rales, or rhonchi. Breathing is unlabored.  Heart: RRR with S1 S2.  There is a soft systolic murmur.    Abdomen: Soft, non-tender, non-distended with normoactive bowel sounds. No hepatomegaly. No rebound/guarding. No obvious abdominal masses.  Msk:  Strength and tone appear normal for age.  Extremities: No clubbing or cyanosis. No edema.  Distal pedal pulses are 2+ and equal bilaterally.  Neuro: Alert and oriented X 3. Moves all extremities spontaneously.  Psych:  Responds to questions appropriately with a normal affect.  ECG: NSR, LAFB, LVH.  Assessment / Plan:

## 2011-04-24 ENCOUNTER — Other Ambulatory Visit: Payer: Self-pay

## 2011-04-24 NOTE — Telephone Encounter (Signed)
Patient called for refill of Oxycodone. Please advise    KP

## 2011-04-24 NOTE — Telephone Encounter (Signed)
OK to refill

## 2011-04-26 MED ORDER — OXYCODONE-ACETAMINOPHEN 7.5-500 MG PO TABS: 1.0000 | ORAL_TABLET | ORAL | Status: DC | PRN

## 2011-04-26 NOTE — Telephone Encounter (Signed)
Ok 120, no RF 

## 2011-04-26 NOTE — Telephone Encounter (Signed)
Refill done.  

## 2011-04-27 ENCOUNTER — Other Ambulatory Visit: Payer: Self-pay | Admitting: *Deleted

## 2011-04-27 MED ORDER — OXYCODONE-ACETAMINOPHEN 7.5-500 MG PO TABS
1.0000 | ORAL_TABLET | ORAL | Status: DC | PRN
Start: 2011-04-27 — End: 2011-07-13

## 2011-04-27 NOTE — Telephone Encounter (Signed)
Refill done.  

## 2011-04-27 NOTE — Telephone Encounter (Signed)
[facility] Triage Call Report Triage Record Num: [id number] Operator: Chevis Pretty Patient Name: Derrick Fry Call Date & Time: 04/26/2011 5:23:16PM Patient Phone: [phone] PCP: Patient Gender: Male PCP Fax : Patient DOB: October 18, 1927 Practice Name: [facility] Reason for Call: Caller: Tom/RPh [facility] [phone] calling with a question about Percocet 7.5/500 1 po q 4 h prn #120. The medication was written by Willow Ora. States Rx was sent by office staff via fax. States unable to accept this Rx via fax, and that he did destroy the Rx. States he advised the patient to follow up with office to obtain hard copy of scrip. Info to office. Protocol(s) Used: Medication Questions - Adult Recommended Outcome per Protocol: Speak with Provider or Pharmacist within 24 hours Reason for Outcome: Requests refill of prescribed medication with valid refills; lack of medications does not put patient at clinical risk Care Advice:

## 2011-07-13 ENCOUNTER — Encounter: Payer: Self-pay | Admitting: Internal Medicine

## 2011-07-13 ENCOUNTER — Ambulatory Visit (INDEPENDENT_AMBULATORY_CARE_PROVIDER_SITE_OTHER): Payer: Medicare Other | Admitting: Internal Medicine

## 2011-07-13 VITALS — BP 156/80 | HR 64 | Temp 98.2°F | Wt 177.0 lb

## 2011-07-13 DIAGNOSIS — Z0000 Encounter for general adult medical examination without abnormal findings: Secondary | ICD-10-CM

## 2011-07-13 DIAGNOSIS — M109 Gout, unspecified: Secondary | ICD-10-CM

## 2011-07-13 DIAGNOSIS — G609 Hereditary and idiopathic neuropathy, unspecified: Secondary | ICD-10-CM

## 2011-07-13 DIAGNOSIS — N509 Disorder of male genital organs, unspecified: Secondary | ICD-10-CM

## 2011-07-13 DIAGNOSIS — E785 Hyperlipidemia, unspecified: Secondary | ICD-10-CM

## 2011-07-13 DIAGNOSIS — N189 Chronic kidney disease, unspecified: Secondary | ICD-10-CM

## 2011-07-13 DIAGNOSIS — I10 Essential (primary) hypertension: Secondary | ICD-10-CM

## 2011-07-13 DIAGNOSIS — E119 Type 2 diabetes mellitus without complications: Secondary | ICD-10-CM

## 2011-07-13 DIAGNOSIS — N5082 Scrotal pain: Secondary | ICD-10-CM

## 2011-07-13 MED ORDER — OXYCODONE-ACETAMINOPHEN 7.5-500 MG PO TABS: 1.0000 | ORAL_TABLET | ORAL | Status: DC | PRN

## 2011-07-13 NOTE — Assessment & Plan Note (Signed)
Sees Dr. Dareen Piano every 6 months, on chronic steroids, last bone density test 08/2009, negative.

## 2011-07-13 NOTE — Assessment & Plan Note (Signed)
Still has occasional symptoms, see history of present illness. He is currently taking 2 tablets of Neurontin daily, will increase to 2 tablets twice a day

## 2011-07-13 NOTE — Patient Instructions (Signed)
Check the  blood pressure 2 or 3 times a week, be sure it is between 110/60 and 140/85. If it is consistently higher or lower, let me know  

## 2011-07-13 NOTE — Assessment & Plan Note (Signed)
Kidney function has been stable over time. Check a BMP

## 2011-07-13 NOTE — Assessment & Plan Note (Signed)
Still has discomfort in the scrotum seldom, physical exam is is stable,  ultrasound of the scrotum normal. Recommend observation

## 2011-07-13 NOTE — Assessment & Plan Note (Addendum)
Td 2010  pneumonia shot  2007 Shingles shot--- @ the [place] per pt   colonoscopy in 2005, report reviewed, next colonoscopy 2010. Dr. Madilyn Fireman; patient reports he got a call and was told that cscope was optional. Decided not to go for another one  DRE 03-2010 ---normal, PSAs have been  normal. No need for further prostate cancer screening.   Doing very well, diet, exercise and fall prevention discussed.

## 2011-07-13 NOTE — Progress Notes (Signed)
  Subjective:    Patient ID: Derrick Fry, male    DOB: 1927-03-20, 78 y.o.   MRN: [id number]  HPI  Here for Medicare AWV: 1. Risk factors based on Past M, S, F history: reviewed 2. Physical Activities: yard and garden if good weather, inactive during the winter --> counseled ? [employer] 3. Depression/mood:  No problems noted or reported   4. Hearing:  Corrected w/ hearing aids which uses inconsistently   5. ADL's:  Independent   6. Fall Risk: no recent falls, prevention discussed   7. home Safety: does feel safe at home   8. Height, weight, &visual acuity: see VS, vision stable, sees eye doctor regulalrly 9. Counseling: provided 10. Labs ordered based on risk factors: if needed   11. Referral Coordination: if needed 12.  Care Plan, see assessment and plan   13.   Cognitive Assessment: motor skill and cognition appropriate for age.  In addition, today we discussed the following: CAD-- sees cardiology regularly  Hyperlipidemia-- on dual therapy , no s/e good compliance   Gout--no recent episodes , still sees Dr Dareen Piano q 6 months , still on prednisone daily Neuropathy, he is taking Neurontin, less than originally prescribed, see assessment and plan. Still have symptoms described as severe-sharp pain @ toes that lasts a few seconds on and off. The toes are not swollen or red. DJD, on Percocet, takes around 2 tablets daily, pain is relatively well control and he has not gotten too sleepy.  Past Medical History: CV: CAD--Dr Tennant--MI 08-04-85--CABG '87 ECHO 11-08--NL fx, decreased LV relaxation & Dr Deborah Chalk office Remote AAA repair---> Abd.u/s 07-2010 (-) for AAA Carotid u/s 01-2010 0-39% stable, no recall Lexiscan stable 10-2009 ---------------------------------------------------------------------------------------------------------------------- hyperlipidemia DM w/ peripheral neuropathy HTN OSA, Dx 2006; uses CPAP soemtimes, limited tolerance RENAL INSUFFICIENCY, CHRONIC w/ SOLITARY  KIDNEY, CONGENITAL   DEPRESSION Osteoarthritis Gout ------------  gout  diskitis 10/2008, Dr Dareen Piano  gait d/o  (chronic imbalance)  Past Surgical History: Inguinal herniorrhaphy (03/07/1983) Coronary artery bypass graft (01/04/1986) Total knee replacement (05/04/1989) -left Cataract extraction (09/1999) rt Cataract extraction (04/2003) -left knuckles replaced in left hand - 05/2005  FH CAD-- B Stroke--  no DM-- B Cancer -- skin cancer F   Social History: Married, two children, retired Tobacco-- 1 ppd , quit in the 70s ETOH-- no   Review of Systems No chest pain or shortness or breath No nausea, vomiting, diarrhea or blood in the stools. No dysuria, gross hematuria. He has some mild difficulty urinating. He also continued having very seldom some scrotal discomfort, ill-defined.    Objective:   Physical Exam  General -- alert, well-developed, and well-nourished.   Neck --no thyromegaly Lungs -- normal respiratory effort, no intercostal retractions, no accessory muscle use, and slt decreased breath sounds.   Heart-- normal rate, regular rhythm, no murmur, and no gallop.   Abdomen--soft, non-tender, no distention, no masses, no HSM, no guarding, and no rigidity.   Extremities-- no pretibial edema bilaterally GU-- left testicle is slightly larger but other than that normal. The most distal area of the spermatic cord on the left side is slightly larger compared to the right but is not tender, not dular. Neurologic-- alert & oriented X3 and strength normal in all extremities. Psych-- Cognition and judgment appear intact. Alert and cooperative with normal attention span and concentration.  not anxious appearing and not depressed appearing.       Assessment & Plan:

## 2011-07-13 NOTE — Assessment & Plan Note (Signed)
Due for labs

## 2011-07-13 NOTE — Assessment & Plan Note (Signed)
BP slightly elevated today, see instructions. We'll monitor  at home, no change in medications for now

## 2011-07-13 NOTE — Assessment & Plan Note (Signed)
Recent cholesterol panel very good, no change

## 2011-08-11 ENCOUNTER — Telehealth: Payer: Self-pay | Admitting: Internal Medicine

## 2011-08-11 NOTE — Telephone Encounter (Signed)
Labs ordered @ last OV not done, please call pt and schedule labs

## 2011-08-11 NOTE — Telephone Encounter (Signed)
lmovm

## 2011-08-14 NOTE — Telephone Encounter (Signed)
Pt is scheduled for tomorrow, 08/15/11, at 8:46 for labs.

## 2011-08-15 ENCOUNTER — Other Ambulatory Visit (INDEPENDENT_AMBULATORY_CARE_PROVIDER_SITE_OTHER): Payer: Medicare Other

## 2011-08-15 ENCOUNTER — Other Ambulatory Visit: Payer: Self-pay | Admitting: Internal Medicine

## 2011-08-15 DIAGNOSIS — E785 Hyperlipidemia, unspecified: Secondary | ICD-10-CM

## 2011-08-15 LAB — CBC WITH DIFFERENTIAL/PLATELET
Basophils Absolute: 0 10*3/uL (ref 0.0–0.1)
Basophils Relative: 0.6 % (ref 0.0–3.0)
Hemoglobin: 12.3 g/dL — ABNORMAL LOW (ref 13.0–17.0)
Lymphocytes Relative: 27.7 % (ref 12.0–46.0)
Monocytes Relative: 10.5 % (ref 3.0–12.0)
Neutro Abs: 4.2 10*3/uL (ref 1.4–7.7)
Neutrophils Relative %: 57.1 % (ref 43.0–77.0)
RBC: 3.73 Mil/uL — ABNORMAL LOW (ref 4.22–5.81)

## 2011-08-15 LAB — BASIC METABOLIC PANEL
BUN: 40 mg/dL — ABNORMAL HIGH (ref 6–23)
CO2: 26 mEq/L (ref 19–32)
Chloride: 108 mEq/L (ref 96–112)
Creatinine, Ser: 1.7 mg/dL — ABNORMAL HIGH (ref 0.4–1.5)
Potassium: 3.9 mEq/L (ref 3.5–5.1)

## 2011-08-15 MED ORDER — OXYCODONE-ACETAMINOPHEN 7.5-500 MG PO TABS: 1.0000 | ORAL_TABLET | ORAL | Status: DC | PRN

## 2011-08-15 NOTE — Telephone Encounter (Signed)
done

## 2011-08-15 NOTE — Telephone Encounter (Signed)
Notified pt rx is ready to be picked up at front desk.  

## 2011-08-15 NOTE — Telephone Encounter (Signed)
Oxycodone 7.5-500mg  # 60 with zero refills. Last filled on 5.9.13. OK to refill?

## 2011-08-18 ENCOUNTER — Encounter: Payer: Self-pay | Admitting: *Deleted

## 2011-09-26 ENCOUNTER — Other Ambulatory Visit: Payer: Self-pay

## 2011-09-26 MED ORDER — OXYCODONE-ACETAMINOPHEN 7.5-500 MG PO TABS: 1.0000 | ORAL_TABLET | Freq: Two times a day (BID) | ORAL | Status: DC | PRN

## 2011-09-26 NOTE — Telephone Encounter (Signed)
Call from patient requesting a Refill on Oxycodone. Please advise    KP

## 2011-09-26 NOTE — Telephone Encounter (Signed)
Ok to refill 

## 2011-09-26 NOTE — Telephone Encounter (Signed)
Refill done.  

## 2011-10-23 ENCOUNTER — Other Ambulatory Visit: Payer: Self-pay | Admitting: Internal Medicine

## 2011-10-23 MED ORDER — OXYCODONE-ACETAMINOPHEN 7.5-500 MG PO TABS: 1.0000 | ORAL_TABLET | Freq: Two times a day (BID) | ORAL | Status: DC | PRN

## 2011-10-23 NOTE — Telephone Encounter (Signed)
Ok to refill 

## 2011-10-23 NOTE — Telephone Encounter (Signed)
OK X1 

## 2011-10-23 NOTE — Telephone Encounter (Signed)
Refill done.  

## 2011-10-23 NOTE — Telephone Encounter (Signed)
Refill Oxycodone-Acetaminophen (Tab) PERCOCET 7.5-500 MG Take 1 tablet by mouth 2 (two) times daily as needed for pain. Last wrt 7.23.13 #60  Last ov 5.9.13 V70

## 2011-11-27 ENCOUNTER — Other Ambulatory Visit: Payer: Self-pay | Admitting: Internal Medicine

## 2011-11-27 NOTE — Telephone Encounter (Signed)
Ok 60, no RF 

## 2011-11-27 NOTE — Telephone Encounter (Signed)
Ok to refill 

## 2011-11-27 NOTE — Telephone Encounter (Signed)
Per call from pt 807am this morning needs refill on oxycodone  Last wrt 8.19.13 Oxycodone-Acetaminophen (Tab) PERCOCET 7.5-500 MG Take 1 tablet by mouth 2 (two) times daily as needed for pain.   Last ov 5.9.13 V70

## 2011-11-28 MED ORDER — OXYCODONE-ACETAMINOPHEN 7.5-500 MG PO TABS: 1.0000 | ORAL_TABLET | Freq: Two times a day (BID) | ORAL | Status: DC | PRN

## 2011-11-28 NOTE — Telephone Encounter (Signed)
Refill done.  

## 2011-12-25 LAB — HM DIABETES EYE EXAM

## 2011-12-28 ENCOUNTER — Other Ambulatory Visit: Payer: Self-pay

## 2011-12-28 NOTE — Telephone Encounter (Signed)
OV 07/13/11. Oxycodone last filled 11/28/11 #60 no refills   PLz advise    MW

## 2011-12-29 MED ORDER — OXYCODONE-ACETAMINOPHEN 7.5-500 MG PO TABS: 1.0000 | ORAL_TABLET | Freq: Two times a day (BID) | ORAL | Status: DC | PRN

## 2011-12-29 NOTE — Telephone Encounter (Signed)
done

## 2012-01-01 ENCOUNTER — Encounter: Payer: Self-pay | Admitting: *Deleted

## 2012-01-15 ENCOUNTER — Ambulatory Visit (INDEPENDENT_AMBULATORY_CARE_PROVIDER_SITE_OTHER)
Admission: RE | Admit: 2012-01-15 | Discharge: 2012-01-15 | Disposition: A | Discharge status: Home / Self Care | Payer: Medicare Other | Source: Ambulatory Visit | Attending: Internal Medicine | Admitting: Internal Medicine

## 2012-01-15 ENCOUNTER — Encounter: Payer: Self-pay | Admitting: Internal Medicine

## 2012-01-15 ENCOUNTER — Ambulatory Visit (INDEPENDENT_AMBULATORY_CARE_PROVIDER_SITE_OTHER): Payer: Medicare Other | Admitting: Internal Medicine

## 2012-01-15 VITALS — BP 136/74 | HR 46 | Temp 97.4°F | Wt 179.0 lb

## 2012-01-15 DIAGNOSIS — E119 Type 2 diabetes mellitus without complications: Secondary | ICD-10-CM

## 2012-01-15 DIAGNOSIS — N189 Chronic kidney disease, unspecified: Secondary | ICD-10-CM

## 2012-01-15 DIAGNOSIS — I2510 Atherosclerotic heart disease of native coronary artery without angina pectoris: Secondary | ICD-10-CM

## 2012-01-15 DIAGNOSIS — Z79899 Other long term (current) drug therapy: Secondary | ICD-10-CM

## 2012-01-15 DIAGNOSIS — D649 Anemia, unspecified: Secondary | ICD-10-CM

## 2012-01-15 LAB — CBC WITH DIFFERENTIAL/PLATELET
Basophils Absolute: 0 10*3/uL (ref 0.0–0.1)
Basophils Relative: 0.3 % (ref 0.0–3.0)
Eosinophils Relative: 2.8 % (ref 0.0–5.0)
HCT: 38.7 % — ABNORMAL LOW (ref 39.0–52.0)
Hemoglobin: 12.8 g/dL — ABNORMAL LOW (ref 13.0–17.0)
Lymphocytes Relative: 13.3 % (ref 12.0–46.0)
Lymphs Abs: 1.2 10*3/uL (ref 0.7–4.0)
Monocytes Relative: 7.5 % (ref 3.0–12.0)
Neutro Abs: 6.7 10*3/uL (ref 1.4–7.7)
RBC: 3.85 Mil/uL — ABNORMAL LOW (ref 4.22–5.81)
RDW: 13.3 % (ref 11.5–14.6)
WBC: 8.8 10*3/uL (ref 4.5–10.5)

## 2012-01-15 LAB — BASIC METABOLIC PANEL
Calcium: 9.5 mg/dL (ref 8.4–10.5)
GFR: 49.43 mL/min — ABNORMAL LOW (ref >60.00)
Glucose, Bld: 153 mg/dL — ABNORMAL HIGH (ref 70–99)
Potassium: 4 mEq/L (ref 3.5–5.1)
Sodium: 139 mEq/L (ref 135–145)

## 2012-01-15 LAB — FERRITIN: Ferritin: 90.9 ng/mL (ref 22.0–322.0)

## 2012-01-15 LAB — IRON: Iron: 93 ug/dL (ref 42–165)

## 2012-01-15 LAB — FOLATE: Folate: >24.8 ng/mL (ref >5.9)

## 2012-01-15 NOTE — Assessment & Plan Note (Signed)
Recheck A1c 

## 2012-01-15 NOTE — Assessment & Plan Note (Signed)
Check a BMP

## 2012-01-15 NOTE — Progress Notes (Signed)
  Subjective:    Patient ID: Derrick Fry, male    DOB: 12-13-1927, 78 y.o.   MRN: [id number]  HPI Routine office visit CAD, good compliance with aspirin and other medicines. Diabetes, check his blood sugars regularly, in the morning is usually 120, 130. Has neuropathy, symptoms still there but not bothersome. Good compliance w/ Neurontin High cholesterol, good medication compliance.  Past Medical History: CV: CAD--Dr Tennant--MI 08-04-85--CABG '87 ECHO 11-08--NL fx, decreased LV relaxation & Dr Deborah Chalk office Remote AAA repair---> Abd.u/s 07-2010 (-) for AAA Carotid u/s 01-2010 0-39% stable, no recall Lexiscan stable 10-2009 ---------------------------------------------------------------------------------------------------------------------- hyperlipidemia DM w/ peripheral neuropathy HTN OSA, Dx 2006; uses CPAP soemtimes, limited tolerance RENAL INSUFFICIENCY, CHRONIC w/ SOLITARY KIDNEY, CONGENITAL    DEPRESSION Osteoarthritis Gout ------------  gout  diskitis 10/2008, Dr Dareen Piano   gait d/o  (chronic imbalance)  Past Surgical History: Inguinal herniorrhaphy (03/07/1983) Coronary artery bypass graft (01/04/1986) Total knee replacement (05/04/1989) -left Cataract extraction (09/1999) rt Cataract extraction (04/2003) -left knuckles replaced in left hand - 05/2005  FH CAD-- B Stroke--  no DM-- B Cancer -- skin cancer F   Social History: Married, two children, retired Tobacco-- 1 ppd , quit in the 70s ETOH-- no   Review of Systems No chest pain or dyspnea on exertion with activities of daily living. No cough. No nausea, vomiting, diarrhea or blood in the stools. Patient does not take NSAIDs. Went to the [place] a couple weeks ago, labs were drawn, results unknown    Objective:   Physical Exam  General -- alert, well-developed, and well-nourished.   Neck --no JVD at 45  Lungs -- normal respiratory effort, no intercostal retractions, no accessory muscle use, and few dry  crackles at bases. No wheezing.    Heart-- normal rate, regular rhythm, no murmur, and no gallop.   Abdomen--soft, non-tender, no distention, no masses   Extremities-- no pretibial edema bilaterally  Neurologic-- alert & oriented X3 and strength normal in all extremities. Psych-- Cognition and judgment appear intact. Alert and cooperative with normal attention span and concentration.  not anxious appearing and not depressed appearing.      Assessment & Plan:

## 2012-01-15 NOTE — Assessment & Plan Note (Addendum)
Mild anemia noted, may be from chronic renal insufficiency, MCV slightly elevated, vitamin deficiency? He is asymptomatic from a GI standpoint. colonoscopy in 2005,   next colonoscopy 2010. Dr. Madilyn Fireman; patient reports he got a call and was told that cscope was optional. Decided not to go for another one. Plan: Labs, further advise w/ results

## 2012-01-15 NOTE — Patient Instructions (Addendum)
Please get your x-ray at the other [place]  office located at: [address] [place], across from [facility].  Please go to the basement, this is a walk-in facility, they are open from 8:30 to 5:30 PM. Phone number [phone]. --- Keep taking your medications as you are doing. If you get a copy of your [facility], please  Send me a copy. Next visit in 4-5 months.

## 2012-01-15 NOTE — Assessment & Plan Note (Addendum)
Asymptomatic, reports he sees cardiology once a year. Dry crackles noted at bases, no JVD. Will check has chest x-ray, I doubt he has CHF, could have some pulmonary fibrosis, he is a former smoker; respiratory review of systems is essentially negative.

## 2012-01-17 ENCOUNTER — Encounter: Payer: Self-pay | Admitting: *Deleted

## 2012-03-20 ENCOUNTER — Telehealth: Payer: Self-pay | Admitting: Internal Medicine

## 2012-03-20 MED ORDER — OXYCODONE-ACETAMINOPHEN 7.5-500 MG PO TABS: 1.0000 | ORAL_TABLET | Freq: Two times a day (BID) | ORAL | Status: DC | PRN

## 2012-03-20 NOTE — Telephone Encounter (Signed)
done

## 2012-03-20 NOTE — Telephone Encounter (Signed)
Patient came into office requesting rx for oxycodone. He is here now with his wife and would like to get this while he is here. CB# [phone]

## 2012-03-20 NOTE — Telephone Encounter (Signed)
Ok to refill? Last OV 11.11.13 Last filled 10.24.13

## 2012-03-21 ENCOUNTER — Encounter: Payer: Self-pay | Admitting: *Deleted

## 2012-03-31 ENCOUNTER — Encounter (HOSPITAL_COMMUNITY): Payer: Self-pay | Admitting: Emergency Medicine

## 2012-03-31 ENCOUNTER — Inpatient Hospital Stay (HOSPITAL_COMMUNITY)
Admission: EM | Admit: 2012-03-31 | Discharge: 2012-04-03 | DRG: 389 | Disposition: A | Discharge status: Home / Self Care | Payer: Medicare Other | Attending: Internal Medicine | Admitting: Internal Medicine

## 2012-03-31 ENCOUNTER — Emergency Department (HOSPITAL_COMMUNITY): Payer: Medicare Other

## 2012-03-31 ENCOUNTER — Inpatient Hospital Stay (HOSPITAL_COMMUNITY): Payer: Medicare Other

## 2012-03-31 DIAGNOSIS — K56609 Unspecified intestinal obstruction, unspecified as to partial versus complete obstruction: Secondary | ICD-10-CM

## 2012-03-31 DIAGNOSIS — G4733 Obstructive sleep apnea (adult) (pediatric): Secondary | ICD-10-CM | POA: Diagnosis present

## 2012-03-31 DIAGNOSIS — R112 Nausea with vomiting, unspecified: Secondary | ICD-10-CM

## 2012-03-31 DIAGNOSIS — I252 Old myocardial infarction: Secondary | ICD-10-CM

## 2012-03-31 DIAGNOSIS — D649 Anemia, unspecified: Secondary | ICD-10-CM

## 2012-03-31 DIAGNOSIS — D696 Thrombocytopenia, unspecified: Secondary | ICD-10-CM | POA: Diagnosis present

## 2012-03-31 DIAGNOSIS — R269 Unspecified abnormalities of gait and mobility: Secondary | ICD-10-CM

## 2012-03-31 DIAGNOSIS — F3289 Other specified depressive episodes: Secondary | ICD-10-CM | POA: Diagnosis present

## 2012-03-31 DIAGNOSIS — F329 Major depressive disorder, single episode, unspecified: Secondary | ICD-10-CM | POA: Diagnosis present

## 2012-03-31 DIAGNOSIS — Q605 Renal hypoplasia, unspecified: Secondary | ICD-10-CM

## 2012-03-31 DIAGNOSIS — E7800 Pure hypercholesterolemia, unspecified: Secondary | ICD-10-CM | POA: Diagnosis present

## 2012-03-31 DIAGNOSIS — M129 Arthropathy, unspecified: Secondary | ICD-10-CM | POA: Diagnosis present

## 2012-03-31 DIAGNOSIS — I10 Essential (primary) hypertension: Secondary | ICD-10-CM

## 2012-03-31 DIAGNOSIS — Z8701 Personal history of pneumonia (recurrent): Secondary | ICD-10-CM

## 2012-03-31 DIAGNOSIS — I714 Abdominal aortic aneurysm, without rupture, unspecified: Secondary | ICD-10-CM | POA: Diagnosis present

## 2012-03-31 DIAGNOSIS — I6529 Occlusion and stenosis of unspecified carotid artery: Secondary | ICD-10-CM

## 2012-03-31 DIAGNOSIS — E785 Hyperlipidemia, unspecified: Secondary | ICD-10-CM | POA: Diagnosis present

## 2012-03-31 DIAGNOSIS — Z951 Presence of aortocoronary bypass graft: Secondary | ICD-10-CM

## 2012-03-31 DIAGNOSIS — D72829 Elevated white blood cell count, unspecified: Secondary | ICD-10-CM

## 2012-03-31 DIAGNOSIS — K56600 Partial intestinal obstruction, unspecified as to cause: Secondary | ICD-10-CM

## 2012-03-31 DIAGNOSIS — E119 Type 2 diabetes mellitus without complications: Secondary | ICD-10-CM | POA: Diagnosis present

## 2012-03-31 DIAGNOSIS — N179 Acute kidney failure, unspecified: Secondary | ICD-10-CM | POA: Diagnosis present

## 2012-03-31 DIAGNOSIS — K565 Intestinal adhesions [bands], unspecified as to partial versus complete obstruction: Principal | ICD-10-CM | POA: Diagnosis present

## 2012-03-31 DIAGNOSIS — N189 Chronic kidney disease, unspecified: Secondary | ICD-10-CM

## 2012-03-31 DIAGNOSIS — M1990 Unspecified osteoarthritis, unspecified site: Secondary | ICD-10-CM

## 2012-03-31 DIAGNOSIS — Q602 Renal agenesis, unspecified: Secondary | ICD-10-CM

## 2012-03-31 DIAGNOSIS — G4730 Sleep apnea, unspecified: Secondary | ICD-10-CM

## 2012-03-31 DIAGNOSIS — I2510 Atherosclerotic heart disease of native coronary artery without angina pectoris: Secondary | ICD-10-CM

## 2012-03-31 DIAGNOSIS — Z79899 Other long term (current) drug therapy: Secondary | ICD-10-CM

## 2012-03-31 DIAGNOSIS — G609 Hereditary and idiopathic neuropathy, unspecified: Secondary | ICD-10-CM | POA: Diagnosis present

## 2012-03-31 DIAGNOSIS — M109 Gout, unspecified: Secondary | ICD-10-CM | POA: Diagnosis present

## 2012-03-31 DIAGNOSIS — K913 Postprocedural intestinal obstruction, unspecified as to partial versus complete: Secondary | ICD-10-CM

## 2012-03-31 HISTORY — DX: Unspecified intestinal obstruction, unspecified as to partial versus complete obstruction: K56.609

## 2012-03-31 LAB — CBC WITH DIFFERENTIAL/PLATELET
Eosinophils Absolute: 0 10*3/uL (ref 0.0–0.7)
Lymphs Abs: 1.2 10*3/uL (ref 0.7–4.0)
MCH: 33.2 pg (ref 26.0–34.0)
Neutrophils Relative %: 83 % — ABNORMAL HIGH (ref 43–77)
Platelets: 132 10*3/uL — ABNORMAL LOW (ref 150–400)
RBC: 4.1 MIL/uL — ABNORMAL LOW (ref 4.22–5.81)
WBC: 13.4 10*3/uL — ABNORMAL HIGH (ref 4.0–10.5)

## 2012-03-31 LAB — CREATININE, SERUM
Creatinine, Ser: 1.27 mg/dL (ref 0.50–1.35)
GFR calc Af Amer: 59 mL/min — ABNORMAL LOW (ref >90)
GFR calc non Af Amer: 51 mL/min — ABNORMAL LOW (ref >90)

## 2012-03-31 LAB — COMPREHENSIVE METABOLIC PANEL
ALT: 17 U/L (ref 0–53)
AST: 33 U/L (ref 0–37)
Albumin: 3.9 g/dL (ref 3.5–5.2)
Alkaline Phosphatase: 80 U/L (ref 39–117)
CO2: 30 mEq/L (ref 19–32)
Chloride: 96 mEq/L (ref 96–112)
GFR calc non Af Amer: 53 mL/min — ABNORMAL LOW (ref >90)
Potassium: 3.8 mEq/L (ref 3.5–5.1)
Sodium: 142 mEq/L (ref 135–145)
Total Bilirubin: 0.6 mg/dL (ref 0.3–1.2)

## 2012-03-31 LAB — LACTIC ACID, PLASMA: Lactic Acid, Venous: 4.1 mmol/L — ABNORMAL HIGH (ref 0.5–2.2)

## 2012-03-31 LAB — CBC
MCV: 96.6 fL (ref 78.0–100.0)
Platelets: 158 10*3/uL (ref 150–400)
RBC: 4.43 MIL/uL (ref 4.22–5.81)
RDW: 13.1 % (ref 11.5–15.5)
WBC: 14.3 10*3/uL — ABNORMAL HIGH (ref 4.0–10.5)

## 2012-03-31 MED ORDER — MORPHINE SULFATE 2 MG/ML IJ SOLN
1.0000 mg | INTRAMUSCULAR | Status: DC | PRN
  Administered 2012-03-31 – 2012-04-01 (×4): 1 mg via INTRAVENOUS
  Filled 2012-03-31 (×4): qty 1

## 2012-03-31 MED ORDER — PROMETHAZINE HCL 25 MG/ML IJ SOLN
12.5000 mg | Freq: Four times a day (QID) | INTRAMUSCULAR | Status: DC | PRN
Start: 2012-03-31 — End: 2012-04-03
  Filled 2012-03-31: qty 1

## 2012-03-31 MED ORDER — PROMETHAZINE HCL 12.5 MG PO TABS: 12.5000 mg | ORAL_TABLET | Freq: Four times a day (QID) | ORAL | Status: DC | PRN

## 2012-03-31 MED ORDER — SODIUM CHLORIDE 0.9 % IV BOLUS (SEPSIS)
750.0000 mL | Freq: Once | INTRAVENOUS | Status: AC
  Administered 2012-03-31: 750 mL via INTRAVENOUS

## 2012-03-31 MED ORDER — POTASSIUM CHLORIDE IN NACL 20-0.9 MEQ/L-% IV SOLN
INTRAVENOUS | Status: DC
  Administered 2012-03-31: 15:00:00 via INTRAVENOUS
  Filled 2012-03-31 (×2): qty 1000

## 2012-03-31 MED ORDER — PANTOPRAZOLE SODIUM 40 MG IV SOLR
80.0000 mg | Freq: Once | INTRAVENOUS | Status: AC
  Administered 2012-03-31: 80 mg via INTRAVENOUS
  Filled 2012-03-31: qty 80

## 2012-03-31 MED ORDER — PSYLLIUM 95 % PO PACK: 1.0000 | PACK | Freq: Every day | ORAL | Status: DC

## 2012-03-31 MED ORDER — CLINDAMYCIN PHOSPHATE 600 MG/50ML IV SOLN
600.0000 mg | Freq: Three times a day (TID) | INTRAVENOUS | Status: DC
  Administered 2012-03-31 – 2012-04-03 (×8): 600 mg via INTRAVENOUS
  Filled 2012-03-31 (×10): qty 50

## 2012-03-31 MED ORDER — ONDANSETRON HCL 4 MG/2ML IJ SOLN
4.0000 mg | Freq: Once | INTRAMUSCULAR | Status: AC
  Administered 2012-03-31: 4 mg via INTRAVENOUS
  Filled 2012-03-31: qty 2

## 2012-03-31 MED ORDER — KCL IN DEXTROSE-NACL 20-5-0.9 MEQ/L-%-% IV SOLN
INTRAVENOUS | Status: DC
  Administered 2012-03-31: 19:00:00 via INTRAVENOUS
  Filled 2012-03-31 (×3): qty 1000

## 2012-03-31 MED ORDER — LORAZEPAM 2 MG/ML IJ SOLN
0.5000 mg | Freq: Every evening | INTRAMUSCULAR | Status: DC | PRN
  Administered 2012-04-01 – 2012-04-02 (×2): 0.5 mg via INTRAVENOUS
  Filled 2012-03-31 (×2): qty 1

## 2012-03-31 MED ORDER — ACETAMINOPHEN 10 MG/ML IV SOLN
1000.0000 mg | Freq: Four times a day (QID) | INTRAVENOUS | Status: AC | PRN
  Administered 2012-03-31: 1000 mg via INTRAVENOUS
  Filled 2012-03-31 (×2): qty 100

## 2012-03-31 MED ORDER — ONDANSETRON HCL 4 MG/2ML IJ SOLN
4.0000 mg | Freq: Three times a day (TID) | INTRAMUSCULAR | Status: DC | PRN
  Filled 2012-03-31: qty 2

## 2012-03-31 MED ORDER — HYDROCODONE-ACETAMINOPHEN 5-325 MG PO TABS: 1.0000 | ORAL_TABLET | ORAL | Status: DC | PRN

## 2012-03-31 MED ORDER — HEPARIN SODIUM (PORCINE) 5000 UNIT/ML IJ SOLN
5000.0000 [IU] | Freq: Three times a day (TID) | INTRAMUSCULAR | Status: DC
  Filled 2012-03-31 (×2): qty 1

## 2012-03-31 MED ORDER — DOCUSATE SODIUM 100 MG PO CAPS
100.0000 mg | ORAL_CAPSULE | Freq: Two times a day (BID) | ORAL | Status: DC | PRN
  Filled 2012-03-31: qty 1

## 2012-03-31 MED ORDER — SODIUM CHLORIDE 0.9 % IJ SOLN
3.0000 mL | Freq: Two times a day (BID) | INTRAMUSCULAR | Status: DC
  Administered 2012-03-31 – 2012-04-03 (×4): 3 mL via INTRAVENOUS

## 2012-03-31 MED ORDER — METOPROLOL TARTRATE 1 MG/ML IV SOLN
5.0000 mg | Freq: Four times a day (QID) | INTRAVENOUS | Status: DC
  Administered 2012-03-31 (×2): 5 mg via INTRAVENOUS
  Filled 2012-03-31: qty 5

## 2012-03-31 NOTE — Consult Note (Signed)
KESSLER SOLLY Jul 19, 1927  [id number].    Requesting MD: Dr. Dana Allan Chief Complaint/Reason for Consult: RUQ/RLQ abdominal pain, N/V HPI:  79 y/o male with PMH of CAD, HTN, HLD, and multiple abdominal surgeries awoke at 0300 this morning with abdominal pain and nausea. He had an uneventful day yesterday and a lasagna with his family for dinner. None of the other family members present at the meal are currently ill. He describes diffuse, cramping, abdominal pain. He became nauseated and vomited multiple times. He describes the emesis as appearing dark and brown. It tasted like "bile".  He denies fever, respiratory complaints, chest pain, diarrhea, melena, hematochezia, BRBPR, constipation, and dysuria.  He reports vomiting an additional 4 times on the ride from his [place] to his current residence here in [place].  His abdominal pain, N/V is managed at this point with pain meds and antiemetics.      Family History  Problem Relation Age of Onset  . Lymphoma Sister   . Colon cancer Neg Hx   . Prostate cancer Neg Hx     Past Medical History  Diagnosis Date  . CAD (coronary artery disease)   . Myocardial infarct 08/04/85  . Hx of CABG 89    low risk stress test 8/11  . History of echocardiogram     11/08, NL fx, decreased LV relazation  . Hyperlipidemia   . Peripheral neuropathy   . Hypertension   . Renal insufficiency     chronic w/ solitary kidney, congenital  . Depression   . Osteoarthritis   . Gout     diskitis 10/2008, Dr Dareen Piano  . Gait abnormality     chronic imbalance  . Bradycardia   . Palpitations   . Gout   . AAA (abdominal aortic aneurysm)     this   . Pneumonia     had right pneumonia pleurisy requiring resection of ribs and chest tube drainage at age 25    Past Surgical History  Procedure Date  . Inguinal hernia repair 03/07/83  . Coronary artery bypass graft 01/04/86  . Total knee arthroplasty 05/04/89    left  . Cataract  extraction 09/1999,04/2003    rt,left  . Knuckles replaced 05/2005    left hand  . Abdominal aortic aneurysm repair remote    w/ iliac aneurysm repair in the 1990's  . Abdominal aortic aneurysm repair   . Nephrectomy 1996  . US echocardiography 01/14/2007    EF 55-60%  . Cardiovascular stress test 10/13/2009    EF 57%    Social History:  reports that he has never smoked. He does not have any smokeless tobacco history on file. He reports that he does not drink alcohol or use illicit drugs.  Allergies:  Allergies  Allergen Reactions  . Colchicine      (Not in a hospital admission)  Blood pressure 142/66, pulse 96, temperature 98.7 F (37.1 C), temperature source Oral, resp. rate 14, SpO2 98.00%. Physical Exam: General: pleasant, WD/WN white male who is laying in bed in NAD HEENT: head is normocephalic, atraumatic.  Sclera are noninjected.   Heart: regular, rate, and rhythm.  No murmurs heard. Lungs: CTAB, no wheezes, rhonchi, or rales noted.  Respiratory effort nonlabored. Abd: soft, mild distension, mild pain in RUQ/RLQ, decreased BS, large ventral hernia, no masses, or organomegaly MS: all 4 extremities are symmetrical with no cyanosis, clubbing, or edema. Skin: warm and dry with no masses, lesions, or rashes Psych: A&Ox3 with  an appropriate affect.    Results for orders placed during the hospital encounter of 03/31/12 (from the past 48 hour(s))  CBC     Status: Abnormal   Collection Time   03/31/12 10:59 AM      Component Value Range Comment   WBC 14.3 (*) 4.0 - 10.5 K/uL    RBC 4.43  4.22 - 5.81 MIL/uL    Hemoglobin 14.7  13.0 - 17.0 g/dL    HCT 16.1  09.6 - 04.5 %    MCV 96.6  78.0 - 100.0 fL    MCH 33.2  26.0 - 34.0 pg    MCHC 34.3  30.0 - 36.0 g/dL    RDW 40.9  81.1 - 91.4 %    Platelets 158  150 - 400 K/uL   COMPREHENSIVE METABOLIC PANEL     Status: Abnormal   Collection Time   03/31/12 10:59 AM      Component Value Range Comment   Sodium 142  135 - 145 mEq/L     Potassium 3.8  3.5 - 5.1 mEq/L    Chloride 96  96 - 112 mEq/L    CO2 30  19 - 32 mEq/L    Glucose, Bld 145 (*) 70 - 99 mg/dL    BUN 31 (*) 6 - 23 mg/dL    Creatinine, Ser 7.82  0.50 - 1.35 mg/dL    Calcium 95.6 (*) 8.4 - 10.5 mg/dL    Total Protein 7.9  6.0 - 8.3 g/dL    Albumin 3.9  3.5 - 5.2 g/dL    AST 33  0 - 37 U/L    ALT 17  0 - 53 U/L    Alkaline Phosphatase 80  39 - 117 U/L    Total Bilirubin 0.6  0.3 - 1.2 mg/dL    GFR calc non Af Amer 53 (*) >90 mL/min    GFR calc Af Amer 61 (*) >90 mL/min   LIPASE, BLOOD     Status: Normal   Collection Time   03/31/12 10:59 AM      Component Value Range Comment   Lipase 41  11 - 59 U/L   LACTIC ACID, PLASMA     Status: Abnormal   Collection Time   03/31/12 10:59 AM      Component Value Range Comment   Lactic Acid, Venous 4.1 (*) 0.5 - 2.2 mmol/L   POCT GASTRIC OCCULT BLOOD     Status: Abnormal   Collection Time   03/31/12 11:34 AM      Component Value Range Comment   Occult Blood, Gastric POSITIVE (*) NEGATIVE    Dg Abd 1 View  03/31/2012  *RADIOLOGY REPORT*  Clinical Data: Nausea and vomiting.  Abdominal pain.  Prior history of abdominal aortic aneurysm repair and inguinal hernia repair.  ABDOMEN - 1 VIEW  Comparison: Visualized abdomen at the time of lumbar spine imaging 10/20/2008.  Findings: 2 adjacent loops of dilated small bowel in the right upper quadrant of the abdomen.  Gas and expected amount of stool throughout normal caliber colon.  No suggestion of free air on the supine image.  Extensive calcification involving the splenic artery.  Numerous surgical clips in the abdomen and pelvis. Degenerative changes involving the lumbar spine.  IMPRESSION: Partial small bowel obstruction.   Original Report Authenticated By: Hulan Saas, M.D.        Assessment/Plan pSBO on abdominal plain film, leukocytosis (14.3), slightly elevated lactic acid (4.1) 1.  Conservative management:  NPO, IVF,  pain control, antiemetics, low threshold  for NG tube if vomiting returns 2.  Ambulation and IS 3.  Will try conservative mgt for a few days, and if not progressing will likely need an exploratory lap. 4.  The patient is a higher complexity surgical patient given his multiple abdominal surgeries and chronic medical conditions, may need cleared by cards prior to procedure 5.  May need CT to better visualize 6.  Admitted to medicine, will follow with you   DORT, [facility] 03/31/2012, 1:07 PM Pager: [phone]

## 2012-03-31 NOTE — ED Notes (Signed)
Patient transported to X-ray 

## 2012-03-31 NOTE — Progress Notes (Signed)
Pt has temp 102.2. Dr. Mahala Menghini notified, new orders given. Emelda Brothers RN

## 2012-03-31 NOTE — Consult Note (Signed)
p SBO.   Recommend NGT/NPO IVF Would hold on CT now unless clinical suspicion.

## 2012-03-31 NOTE — ED Provider Notes (Signed)
History     CSN: [id number]  Arrival date & time 03/31/12  1020   First MD Initiated Contact with Patient 03/31/12 1020      Chief Complaint  Patient presents with  . Nausea  . Emesis  . Abdominal Pain    (Consider location/radiation/quality/duration/timing/severity/associated sxs/prior treatment) HPI Comments: Derrick Fry awoke at 0300 with abdominal pain and nausea.  He had an uneventful day yesterday and a lasagna with his family for dinner.  None of the other family members present at the meal are currently ill.  He describes diffuse , cramping, abdominal pain.  He became nauseated and vomited multiple times.  He describes the emesis as appearing dark and brown.  It tasted like "bile".  He denies fever, respiratory complaints, diarrhea, melena, hematochezia, BRBPR, constipation, and dysuria.  He reports vomiting an additional 4 times on the ride from his [place] to his current residence here in [place].   The history is provided by the patient. No language interpreter was used.    Past Medical History  Diagnosis Date  . CAD (coronary artery disease)   . Myocardial infarct 08/04/85  . Hx of CABG 89    low risk stress test 8/11  . History of echocardiogram     11/08, NL fx, decreased LV relazation  . Hyperlipidemia   . Peripheral neuropathy   . Hypertension   . Renal insufficiency     chronic w/ solitary kidney, congenital  . Depression   . Osteoarthritis   . Gout     diskitis 10/2008, Dr Dareen Piano  . Gait abnormality     chronic imbalance  . Bradycardia   . Palpitations   . Gout   . AAA (abdominal aortic aneurysm)   . Pneumonia     had right pneumonia pleurisy requiring resection of ribs and chest tube drainage at age 83    Past Surgical History  Procedure Date  . Inguinal hernia repair 03/07/83  . Coronary artery bypass graft 01/04/86  . Total knee arthroplasty 05/04/89    left  . Cataract extraction 09/1999,04/2003    rt,left  . Knuckles  replaced 05/2005    left hand  . Abdominal aortic aneurysm repair remote    w/ iliac aneurysm repair  . Abdominal aortic aneurysm repair   . Nephrectomy 1996  . US echocardiography 01/14/2007    EF 55-60%  . Cardiovascular stress test 10/13/2009    EF 57%    Family History  Problem Relation Age of Onset  . Lymphoma Sister   . Colon cancer Neg Hx   . Prostate cancer Neg Hx     History  Substance Use Topics  . Smoking status: Never Smoker   . Smokeless tobacco: Not on file  . Alcohol Use: No     Comment: former heavy alcohol use      Review of Systems  Constitutional: Positive for appetite change. Negative for fever, chills, diaphoresis, activity change and fatigue.  HENT: Negative for nosebleeds, congestion, sore throat, rhinorrhea, mouth sores, trouble swallowing and neck pain.   Eyes: Negative for visual disturbance.  Respiratory: Negative for cough, shortness of breath and wheezing.   Cardiovascular: Negative for chest pain and palpitations.  Gastrointestinal: Positive for nausea, vomiting and abdominal pain. Negative for diarrhea, constipation, blood in stool, anal bleeding and rectal pain.  Genitourinary: Negative for dysuria, urgency and hematuria.  Musculoskeletal: Negative for myalgias and back pain.  Skin: Negative for pallor, rash and wound.  Neurological: Negative  for dizziness, tremors, syncope, weakness and light-headedness.    Allergies  Colchicine  Home Medications   Current Outpatient Rx  Name  Route  Sig  Dispense  Refill  . ACETAMINOPHEN 325 MG PO TABS   Oral   Take 325 mg by mouth 2 (two) times daily.         . ASPIRIN 81 MG PO TABS   Oral   Take 81 mg by mouth daily.           . ATENOLOL 25 MG PO TABS   Oral   Take 25 mg by mouth daily.           . CYANOCOBALAMIN 100 MCG PO TABS   Oral   Take 100 mcg by mouth daily.           . FEBUXOSTAT 40 MG PO TABS   Oral   Take 80 mg by mouth daily.           . OMEGA-3 FATTY ACIDS  1000 MG PO CAPS   Oral   Take 2 g by mouth daily.           Marland Kitchen FLAXSEED OIL 1000 MG PO CAPS   Oral   Take by mouth.           . FUROSEMIDE 40 MG PO TABS   Oral   Take 40 mg by mouth daily.           Marland Kitchen GABAPENTIN 400 MG PO CAPS   Oral   Take 800 mg by mouth 2 (two) times daily.          Marland Kitchen GLUCOSAMINE-CHONDROITIN 500-400 MG PO TABS   Oral   Take 1 tablet by mouth 3 (three) times daily.          . CENTRUM SILVER PO TABS   Oral   Take 1 tablet by mouth daily.           Marland Kitchen NIACIN 500 MG PO TABS   Oral   Take 500 mg by mouth at bedtime.           . ONETOUCH ULTRA BLUE VI STRP      USE ONE EVERY DAY   100 each   2   . OXYCODONE-ACETAMINOPHEN 7.5-500 MG PO TABS   Oral   Take 1 tablet by mouth 2 (two) times daily as needed for pain.   60 tablet   0   . POLYETHYLENE GLYCOL 3350 PO PACK   Oral   Take 17 g by mouth as needed.          Marland Kitchen PREDNISONE 5 MG PO TABS   Oral   Take 2.5 mg by mouth daily.          Marland Kitchen ROSUVASTATIN CALCIUM 20 MG PO TABS   Oral   Take 20 mg by mouth daily.           BP 142/66  Pulse 96  Temp 98.7 F (37.1 C) (Oral)  Resp 14  SpO2 98%  Physical Exam  Nursing note and vitals reviewed. Constitutional: He is oriented to person, place, and time. He appears well-developed and well-nourished. No distress.  HENT:  Head: Normocephalic and atraumatic.  Right Ear: External ear normal.  Left Ear: External ear normal.  Nose: Nose normal.  Mouth/Throat: Oropharynx is clear and moist. No oropharyngeal exudate.  Eyes: Conjunctivae normal are normal. Pupils are equal, round, and reactive to light. Right eye exhibits no discharge. Left eye exhibits no discharge.  No scleral icterus.  Neck: Normal range of motion. Neck supple. No JVD present. No tracheal deviation present.  Cardiovascular: Regular rhythm and intact distal pulses.   Occasional extrasystoles are present. PMI is not displaced.  Exam reveals decreased pulses. Exam reveals no  gallop, no distant heart sounds and no friction rub.   No murmur heard. Pulmonary/Chest: Effort normal and breath sounds normal. No stridor. No respiratory distress. He has no wheezes. He has no rales. He exhibits no tenderness.  Abdominal: Soft. Normal appearance and bowel sounds are normal. He exhibits no ascites, no pulsatile midline mass and no mass. There is no tenderness. There is no rigidity, no rebound, no guarding, no CVA tenderness, no tenderness at McBurney's point and negative Murphy's sign. A hernia is present. Hernia confirmed positive in the ventral area.       + sm abd wall hernia.  No tenderness or guarding.  Musculoskeletal: Normal range of motion. He exhibits edema (trace). He exhibits no tenderness.  Lymphadenopathy:    He has no cervical adenopathy.  Neurological: He is alert and oriented to person, place, and time. A cranial nerve deficit is present.  Skin: Skin is warm and dry. No rash noted. He is not diaphoretic. No erythema. No pallor.  Psychiatric: He has a normal mood and affect. His behavior is normal.    ED Course  Procedures (including critical care time)  Labs Reviewed - No data to display No results found.   No diagnosis found.   Date: 03/31/2012 @ 1029  Rate: 91 bpm  Rhythm: sinus  QRS Axis: left  Intervals: normal  ST/T Wave abnormalities: nonspecific ST changes  Conduction Disutrbances:left anterior fascicular block, poor R progression  Narrative Interpretation: LVH + left and fasc block,   Old EKG Reviewed: unchanged     MDM  Pt presents for evaluation of abd pain, nausea, and vomiting.  The pain has greatly improved but the nausea persists.  He appears comfortable, afebrile, note stable VS, NAD.  He has a hx of CAD, AAA, an appendectomy, nephrectomy, and hernia repair.  Currently he has no evidence of peritonitis.  Will administer zofran secondary to the ongoing nausea.  Will bolus IVF.  His wife has a sample of the emesis which was described  as dark and foul smelling.  Will have it tested for occult blood.  Will also obtain basic belly labs and reassess as the results become available. 1215.  Pt stable, NAD.  He reports feeling comfortable and the nausea has greatly improve.  The emesis is positive for occult blood.  He has a mild leukocytosis but no anemia.  The KUB is significant for evidence of a partial SBO.  His hx and exam also support this finding.  Discussed with the hospitalist service for admission.  Will consult gen surg also.       Tobin Chad, MD 03/31/12 1224

## 2012-03-31 NOTE — Progress Notes (Signed)
Notified of fever  Of 102.   DD =Viral GE (unlikely), Aspiration 2/2 vomiting  Vs Ischemic colitis GIven lactic acid has dropped and patient not having as much pain on admission, feel this is still SBO. Will rpt AXR in amm and reassess  Appreciate surgeon input  Pleas Koch, MD [facility] [phone]

## 2012-03-31 NOTE — H&P (Signed)
[facility] History and Physical  TACOMA MERIDA XBM:[id number] DOB: 1927-04-13 DOA: 03/31/2012  Referring physician: Lorenso Courier, EDP PCP: Willow Ora, MD  Specialists: Gen surgery will be called by EDP  Chief Complaint: N/v  HPI: Derrick Fry is a 79 y.o. male CM who presented to the ED 1.26.14-he awoke at 03:30 with N/V and abd pain-he vomited everything he had.  He had 3 episodes a large amount-it was initially like lasagna and looked like maybe brown/black coffee but it smelled like stool.  Patient has a sample of it at bedside and it does appear feculent  He was out-of-town, about east of [place] and decided to come on the way he threw up 3 other times and isnt as nauseated or in as much pain-he did have a normal BM when he left this am from [place] prior to coming back to [place]-this has never happened before. The abdominal pain seemed to come on at first-vomiting seemd to make this a little better for a little while No fevers chills-states that abdominal pains or cramping like in nature with intermittent spasms.  He states he had lasagna and a salad which he bought form a grocery store-he also had a bowl of popcorn and ice-cream-rest of the people who ate this didn't seem to get sick  Review of Systems: The patient denies fever, dysuria, cough, phlegm, sick contacts, blurred vision, falls, weakness, rash, chest pain. Rest is negative    Past Medical History  Diagnosis Date  . CAD (coronary artery disease)   . Myocardial infarct 08/04/85  . Hx of CABG 87    low risk stress test 8/11  . History of echocardiogram     11/08, NL fx, decreased LV relazation  . Hyperlipidemia   . Peripheral neuropathy   . Hypertension   . Renal insufficiency     chronic w/ solitary kidney, congenital  . Depression   . Osteoarthritis   . Gout     diskitis 10/2008, Dr Dareen Piano  . Gait abnormality     chronic imbalance  . Bradycardia   . Palpitations   . Gout   . AAA (abdominal aortic  aneurysm)   . Pneumonia     had right pneumonia pleurisy requiring resection of ribs and chest tube drainage at age 57   Chart review  S/p CABG 1987, AAA  Admission 8.20.10 for diskiitis 2/2 to gout, DM, CKD 3  Colonoscopy 11.4.05 was normal  NOted Sleep study 10.23.05 showed mod-sever eOSA  Past Surgical History  Procedure Date  . Inguinal hernia repair 03/07/83  . Coronary artery bypass graft 01/04/86  . Total knee arthroplasty 05/04/89    left  . Cataract extraction 09/1999,04/2003    rt,left  . Knuckles replaced 05/2005    left hand  . Abdominal aortic aneurysm repair remote    w/ iliac aneurysm repair  . Abdominal aortic aneurysm repair   . Nephrectomy 1996  . US echocardiography 01/14/2007    EF 55-60%  . Cardiovascular stress test 10/13/2009    EF 57%   Social History:  reports that he has never smoked. He does not have any smokeless tobacco history on file. He reports that he does not drink alcohol or use illicit drugs. Patient is a home with his wife and is active  Allergies  Allergen Reactions  . Colchicine     Family History  Problem Relation Age of Onset  . Lymphoma Sister   . Colon cancer Neg Hx   . Prostate  cancer Neg Hx    reviewed  Prior to Admission medications   Medication Sig Start Date End Date Taking? Authorizing Provider  acetaminophen (TYLENOL) 325 MG tablet Take 325 mg by mouth 2 (two) times daily.   Yes Historical Provider, MD  aspirin 81 MG tablet Take 81 mg by mouth daily.     Yes Historical Provider, MD  atenolol (TENORMIN) 25 MG tablet Take 25 mg by mouth daily.     Yes Historical Provider, MD  cyanocobalamin 100 MCG tablet Take 100 mcg by mouth daily.     Yes Historical Provider, MD  febuxostat (ULORIC) 40 MG tablet Take 80 mg by mouth daily.     Yes Historical Provider, MD  fish oil-omega-3 fatty acids 1000 MG capsule Take 2 g by mouth daily.     Yes Historical Provider, MD  Flaxseed, Linseed, (FLAXSEED OIL) 1000 MG CAPS Take by mouth.      Yes Historical Provider, MD  furosemide (LASIX) 40 MG tablet Take 40 mg by mouth daily.     Yes Historical Provider, MD  gabapentin (NEURONTIN) 400 MG capsule Take 800 mg by mouth 2 (two) times daily.    Yes Historical Provider, MD  glucosamine-chondroitin 500-400 MG tablet Take 1 tablet by mouth 3 (three) times daily.    Yes Historical Provider, MD  Multiple Vitamins-Minerals (CENTRUM SILVER) tablet Take 1 tablet by mouth daily.     Yes Historical Provider, MD  niacin 500 MG tablet Take 500 mg by mouth at bedtime.     Yes Historical Provider, MD  ONE TOUCH ULTRA TEST test strip USE ONE EVERY DAY 02/08/11  Yes Wanda Plump, MD  oxyCODONE-acetaminophen (PERCOCET) 7.5-500 MG per tablet Take 1 tablet by mouth 2 (two) times daily as needed for pain. 03/20/12 05/07/12 Yes Wanda Plump, MD  polyethylene glycol [facility] / GLYCOLAX) packet Take 17 g by mouth as needed.    Yes Historical Provider, MD  predniSONE (DELTASONE) 5 MG tablet Take 2.5 mg by mouth daily.    Yes Historical Provider, MD  rosuvastatin (CRESTOR) 20 MG tablet Take 20 mg by mouth daily.   Yes Historical Provider, MD   Physical Exam: Filed Vitals:   03/31/12 1033  BP: 142/66  Pulse: 96  Temp: 98.7 F (37.1 C)  TempSrc: Oral  Resp: 14  SpO2: 98%     General:  Alert pleasant oriented 79 year old Caucasian male looking younger than stated age  Eyes: No pallor no icterus  ENT: Moderate dentition  Neck: Soft supple  Cardiovascular: S1-S2, no murmur, some PVCs  Respiratory: Clinically clear  Abdomen: Midline abdominal scar extending from symphysis to the epigastrium with some defect on palpation in the upper abdomen denoting potential hernia. Bowel sounds are hyperactive. Patient is tender mostly at McBurney's point [has had appendectomy as a young man]  Skin: No lower extremity edema  Musculoskeletal: Range of motion intact euthymic  Psychiatric: Euthymic  Neurologic: Power grossly intact  Labs on Admission:  Basic  Metabolic Panel:  Lab 03/31/12 4098  NA 142  K 3.8  CL 96  CO2 30  GLUCOSE 145*  BUN 31*  CREATININE 1.23  CALCIUM 10.6*  MG --  PHOS --   Liver Function Tests:  Lab 03/31/12 1059  AST 33  ALT 17  ALKPHOS 80  BILITOT 0.6  PROT 7.9  ALBUMIN 3.9    Lab 03/31/12 1059  LIPASE 41  AMYLASE --   No results found for this basename: AMMONIA:5 in the last 168  hours CBC:  Lab 03/31/12 1059  WBC 14.3*  NEUTROABS --  HGB 14.7  HCT 42.8  MCV 96.6  PLT 158   Cardiac Enzymes: No results found for this basename: CKTOTAL:5,CKMB:5,CKMBINDEX:5,TROPONINI:5 in the last 168 hours  BNP (last 3 results) No results found for this basename: PROBNP:3 in the last 8760 hours CBG: No results found for this basename: GLUCAP:5 in the last 168 hours  Radiological Exams on Admission: Dg Abd 1 View  03/31/2012  *RADIOLOGY REPORT*  Clinical Data: Nausea and vomiting.  Abdominal pain.  Prior history of abdominal aortic aneurysm repair and inguinal hernia repair.  ABDOMEN - 1 VIEW  Comparison: Visualized abdomen at the time of lumbar spine imaging 10/20/2008.  Findings: 2 adjacent loops of dilated small bowel in the right upper quadrant of the abdomen.  Gas and expected amount of stool throughout normal caliber colon.  No suggestion of free air on the supine image.  Extensive calcification involving the splenic artery.  Numerous surgical clips in the abdomen and pelvis. Degenerative changes involving the lumbar spine.  IMPRESSION: Partial small bowel obstruction.   Original Report Authenticated By: Hulan Saas, M.D.     EKG: Independently reviewed. Sinus rhythm with PVCs PR interval 0.08 QRS axis -64 therefore Ant fascicular block no ST-T wave elevations   Assessment/Plan Principal Problem:  *Small bowel obstruction due to postoperative adhesions Active Problems:  GOUT  OBSTRUCTIVE SLEEP APNEA  SOLITARY KIDNEY, CONGENITAL  AAA (abdominal aortic aneurysm)   1. Likely small bowel  obstruction-patient would prefer not have NG tube. We will keep patient n.p.o., give IV fluids. General surgery has been consulted by emergency room and they will follow for resolution if that does not occur them patient will potentially need revision surgery.  Start IV fluid D5 normal saline 75 cc per hour.  We'll give Phenergan for nausea 2. History of AAA-had surgery in the 1990s. Given cramping in nature pain and that catastrophic pain, we will give low dosages of morphine when necessary to control this. I have a low suspicion think that this is a ruptured aneurysm. 3. History of obstructive sleep apnea-will monitor. Might need each bedtime BiPAP 4. History CAD, frequent PVCs-start metoprolol 5 mg every 6 hourly for heart rate control. At home is on atenolol 25 daily. Hold aspirin for now 5. History of gout-old Uloric 80mg  daily for now 6. History of arthritis-hold prednisone 2.5 mg, chondroitin 3 times a day until taking by mouth, except 7.5/500 7. History of hypercholesterolemia-hold Crestor 20 mg daily, fish oil, flaxseed 8. History of diet-controlled diabetes mellitus-monitor CBG every 4 6 hourly when necessary  Dr. Donell Beers of surgery is aware and will be seeing patient-appreciate input in advacne  Code Status: Full  Family Communication: None at bedside  Disposition Plan: Inpatient   Time spent: 75 minutes  Mahala Menghini [facility] Pager [phone]  If 7PM-7AM, please contact night-coverage [email or web address] Password [facility] 03/31/2012, 12:20 PM

## 2012-03-31 NOTE — ED Notes (Signed)
Pt arrived by EMS with c/o abd pain with radiations around bil sides to back. Pt also c/o n/v and stated that his vomit was dark brown in color. Symptoms started around 0300 this morning. EMS VS BP-140/80 HR-76.

## 2012-04-01 ENCOUNTER — Inpatient Hospital Stay (HOSPITAL_COMMUNITY): Payer: Medicare Other

## 2012-04-01 DIAGNOSIS — D696 Thrombocytopenia, unspecified: Secondary | ICD-10-CM | POA: Diagnosis present

## 2012-04-01 DIAGNOSIS — D649 Anemia, unspecified: Secondary | ICD-10-CM

## 2012-04-01 DIAGNOSIS — N189 Chronic kidney disease, unspecified: Secondary | ICD-10-CM

## 2012-04-01 DIAGNOSIS — K565 Intestinal adhesions [bands], unspecified as to partial versus complete obstruction: Principal | ICD-10-CM

## 2012-04-01 DIAGNOSIS — K56609 Unspecified intestinal obstruction, unspecified as to partial versus complete obstruction: Secondary | ICD-10-CM

## 2012-04-01 HISTORY — DX: Unspecified intestinal obstruction, unspecified as to partial versus complete obstruction: K56.609

## 2012-04-01 LAB — COMPREHENSIVE METABOLIC PANEL
ALT: 12 U/L (ref 0–53)
AST: 25 U/L (ref 0–37)
Albumin: 2.9 g/dL — ABNORMAL LOW (ref 3.5–5.2)
Alkaline Phosphatase: 58 U/L (ref 39–117)
BUN: 28 mg/dL — ABNORMAL HIGH (ref 6–23)
Chloride: 108 mEq/L (ref 96–112)
GFR calc non Af Amer: 46 mL/min — ABNORMAL LOW (ref >90)
Potassium: 4 mEq/L (ref 3.5–5.1)
Total Bilirubin: 0.9 mg/dL (ref 0.3–1.2)

## 2012-04-01 LAB — CBC
MCH: 33.2 pg (ref 26.0–34.0)
MCHC: 33.5 g/dL (ref 30.0–36.0)
MCV: 99.2 fL (ref 78.0–100.0)
Platelets: 121 10*3/uL — ABNORMAL LOW (ref 150–400)
RDW: 13.4 % (ref 11.5–15.5)

## 2012-04-01 MED ORDER — SODIUM CHLORIDE 0.9 % IV SOLN
INTRAVENOUS | Status: DC
  Administered 2012-04-01 (×2): via INTRAVENOUS
  Administered 2012-04-02: 20 mL/h via INTRAVENOUS

## 2012-04-01 MED ORDER — GABAPENTIN 800 MG PO TABS
800.0000 mg | ORAL_TABLET | Freq: Once | ORAL | Status: AC
  Administered 2012-04-01: 800 mg via ORAL
  Filled 2012-04-01: qty 1

## 2012-04-01 MED ORDER — GLYCERIN (LAXATIVE) 2.1 G RE SUPP
1.0000 | Freq: Every day | RECTAL | Status: DC | PRN
  Filled 2012-04-01: qty 1

## 2012-04-01 MED ORDER — BISACODYL 10 MG RE SUPP
10.0000 mg | Freq: Once | RECTAL | Status: AC
  Administered 2012-04-01: 10 mg via RECTAL
  Filled 2012-04-01: qty 1

## 2012-04-01 MED ORDER — METOPROLOL TARTRATE 1 MG/ML IV SOLN
5.0000 mg | Freq: Four times a day (QID) | INTRAVENOUS | Status: DC
  Administered 2012-04-01 (×3): 5 mg via INTRAVENOUS
  Filled 2012-04-01 (×9): qty 5

## 2012-04-01 NOTE — Progress Notes (Signed)
Patient ID: Derrick Fry, male   DOB: 1927/05/09, 79 y.o.   MRN: [id number]    Subjective: Doing much better today, denies n/v over night, NGT has been clamp for about an hour this am and no problems, +flatus starting early this am but no BM yet  Objective: Vital signs in last 24 hours: Temp:  [97.7 F (36.5 C)-102.2 F (39 C)] 98.1 F (36.7 C) (01/27 0544) Pulse Rate:  [65-110] 66  (01/27 0544) Resp:  [11-18] 16  (01/27 0544) BP: (108-155)/(63-109) 149/66 mmHg (01/27 0544) SpO2:  [88 %-99 %] 97 % (01/27 0544) Last BM Date: 03/29/12  Intake/Output from previous day: 01/26 0701 - 01/27 0700 In: 158.8 [I.V.:8.8; IV Piggyback:150] Out: 210 [Emesis/NG output:210] Intake/Output this shift:    PE: Abd: soft, nontender, +bs General: awake, alert, nad  Lab Results:   Basename 04/01/12 0625 03/31/12 1717  WBC 9.7 13.4*  HGB 12.0* 13.6  HCT 35.8* 40.3  PLT 121* 132*   BMET  Basename 04/01/12 0625 03/31/12 1717 03/31/12 1059  NA 144 -- 142  K 4.0 -- 3.8  CL 108 -- 96  CO2 27 -- 30  GLUCOSE 152* -- 145*  BUN 28* -- 31*  CREATININE 1.37* 1.27 --  CALCIUM 9.2 -- 10.6*   PT/INR No results found for this basename: LABPROT:2,INR:2 in the last 72 hours CMP     Component Value Date/Time   NA 144 04/01/2012 0625   K 4.0 04/01/2012 0625   CL 108 04/01/2012 0625   CO2 27 04/01/2012 0625   GLUCOSE 152* 04/01/2012 0625   GLUCOSE 113 02/22/2009   BUN 28* 04/01/2012 0625   CREATININE 1.37* 04/01/2012 0625   CALCIUM 9.2 04/01/2012 0625   PROT 6.2 04/01/2012 0625   ALBUMIN 2.9* 04/01/2012 0625   AST 25 04/01/2012 0625   ALT 12 04/01/2012 0625   ALKPHOS 58 04/01/2012 0625   BILITOT 0.9 04/01/2012 0625   GFRNONAA 46* 04/01/2012 0625   GFRAA 54* 04/01/2012 0625   Lipase     Component Value Date/Time   LIPASE 41 03/31/2012 1059       Studies/Results: Dg Chest 2 View  03/31/2012  *RADIOLOGY REPORT*  Clinical Data: Fever.  CHEST - 2 VIEW  Comparison: PA and lateral chest 01/15/2012 and  10/18/2008.  Findings: NG tube is in place with the side port at the gastroesophageal junction.  The tube should be advanced 3-4 cm. Heart size is normal.  No pneumothorax or pleural fluid.  Mild bibasilar atelectasis is noted.  IMPRESSION:  1.  No acute finding. 2.  NG tube should be advanced 3-4 cm for better positioning.   Original Report Authenticated By: Holley Dexter, M.D.    Dg Abd 1 View  03/31/2012  *RADIOLOGY REPORT*  Clinical Data: Nausea and vomiting.  Abdominal pain.  Prior history of abdominal aortic aneurysm repair and inguinal hernia repair.  ABDOMEN - 1 VIEW  Comparison: Visualized abdomen at the time of lumbar spine imaging 10/20/2008.  Findings: 2 adjacent loops of dilated small bowel in the right upper quadrant of the abdomen.  Gas and expected amount of stool throughout normal caliber colon.  No suggestion of free air on the supine image.  Extensive calcification involving the splenic artery.  Numerous surgical clips in the abdomen and pelvis. Degenerative changes involving the lumbar spine.  IMPRESSION: Partial small bowel obstruction.   Original Report Authenticated By: Hulan Saas, M.D.    Dg Abd Acute W/chest  04/01/2012  *RADIOLOGY REPORT*  Clinical  Data: Small bowel obstruction.  Nasogastric tube.  ACUTE ABDOMEN SERIES (ABDOMEN 2 VIEW & CHEST 1 VIEW)  Comparison: 03/31/2012  Findings: Orogastric tube tip overlies the level of the stomach. Patient has had median sternotomy and CABG.  The heart is enlarged. There is perihilar peribronchial thickening.  No focal consolidations or pleural effusions are identified.  Supine and erect views of the abdomen show moderate colonic stool. No small bowel dilatation identified.  Multiple surgical clips are identified throughout the abdomen.  No free intraperitoneal air.  Degenerative changes are seen within the spine.  IMPRESSION:  1.  Cardiomegaly without pulmonary edema. 2.  Nonobstructive bowel gas pattern.   Original Report  Authenticated By: Norva Pavlov, M.D.     Anti-infectives: Anti-infectives     Start     Dose/Rate Route Frequency Ordered Stop   03/31/12 1800   clindamycin (CLEOCIN) IVPB 600 mg        600 mg 100 mL/hr over 30 Minutes Intravenous 3 times per day 03/31/12 1645             Assessment/Plan 1. SBO: seems to be resolving now, films this AM shows non obstructive pattern, patient clinically improving as well, NGT clamping trial in progress, can d/c later today if no problems and start clears, ok to have ice chips, will continue to follow.  LOS: 1 day    Shunsuke Granzow 04/01/2012

## 2012-04-01 NOTE — Progress Notes (Signed)
Utilization review completed.  

## 2012-04-01 NOTE — Progress Notes (Addendum)
[facility] PROGRESS NOTE  Assessment/Plan: Small bowel obstruction due to postoperative adhesions: - NG to intermittent suction about 200cc . leucocytosis improving, lactic acid resolving. - repeated abd x- ray showed resolving SBO pattern. Passing gas. - monitor electrolytes. D/c clindamycin afebrile. Worsening renal function, d/c KCL, start NS infusion. - clamp NG tube Ice chips.  Thrombocytopenia (04/01/2012) - slowly trending down. - cont. To monitor. Low probability for HIT. - has not drop > 50%, < 1 day since he got heparin no recent admissions, no thrombocysis  or skin necrosis,  Normocytic anemia (04/01/2012) - anemia panel as an outpatient. - Monitor Hbg  Code Status: Full  Family Communication: None at bedside  Disposition Plan: Inpatient     Consultants:  none  Procedures:  none  Antibiotics:  Clindamycin 1.26.2014  HPI/Subjective: Feels better hungry  Objective: Filed Vitals:   03/31/12 2046 03/31/12 2050 04/01/12 0000 04/01/12 0544  BP: 116/85  108/63 149/66  Pulse: 85  65 66  Temp:   97.7 F (36.5 C) 98.1 F (36.7 C)  TempSrc:    Oral  Resp: 16   16  SpO2: 88% 94% 94% 97%    Intake/Output Summary (Last 24 hours) at 04/01/12 0905 Last data filed at 04/01/12 0700  Gross per 24 hour  Intake 158.75 ml  Output    210 ml  Net -51.25 ml   There were no vitals filed for this visit.  Exam:  General: Alert, awake, oriented x3, in no acute distress.  HEENT: No bruits, no goiter. NG tube Heart: Regular rate and rhythm, without murmurs, rubs, gallops.  Lungs: Good air movement, Clear to auscultation Abdomen: Soft, nontender, nondistended, positive bowel sounds.  Neuro: Grossly intact, nonfocal.   Data Reviewed: Basic Metabolic Panel:  Lab 04/01/12 1610 03/31/12 1717 03/31/12 1059  NA 144 -- 142  K 4.0 -- 3.8  CL 108 -- 96  CO2 27 -- 30  GLUCOSE 152* -- 145*  BUN 28* -- 31*  CREATININE 1.37* 1.27 1.23  CALCIUM 9.2 -- 10.6*  MG  -- -- --  PHOS -- -- --   Liver Function Tests:  Lab 04/01/12 0625 03/31/12 1059  AST 25 33  ALT 12 17  ALKPHOS 58 80  BILITOT 0.9 0.6  PROT 6.2 7.9  ALBUMIN 2.9* 3.9    Lab 03/31/12 1059  LIPASE 41  AMYLASE --   No results found for this basename: AMMONIA:5 in the last 168 hours CBC:  Lab 04/01/12 0625 03/31/12 1717 03/31/12 1059  WBC 9.7 13.4* 14.3*  NEUTROABS -- 11.2* --  HGB 12.0* 13.6 14.7  HCT 35.8* 40.3 42.8  MCV 99.2 98.3 96.6  PLT 121* 132* 158   Cardiac Enzymes: No results found for this basename: CKTOTAL:5,CKMB:5,CKMBINDEX:5,TROPONINI:5 in the last 168 hours BNP (last 3 results) No results found for this basename: PROBNP:3 in the last 8760 hours CBG: No results found for this basename: GLUCAP:5 in the last 168 hours  No results found for this or any previous visit (from the past 240 hour(s)).   Studies: Dg Chest 2 View  03/31/2012  *RADIOLOGY REPORT*  Clinical Data: Fever.  CHEST - 2 VIEW  Comparison: PA and lateral chest 01/15/2012 and 10/18/2008.  Findings: NG tube is in place with the side port at the gastroesophageal junction.  The tube should be advanced 3-4 cm. Heart size is normal.  No pneumothorax or pleural fluid.  Mild bibasilar atelectasis is noted.  IMPRESSION:  1.  No acute finding. 2.  NG  tube should be advanced 3-4 cm for better positioning.   Original Report Authenticated By: Holley Dexter, M.D.    Dg Abd 1 View  03/31/2012  *RADIOLOGY REPORT*  Clinical Data: Nausea and vomiting.  Abdominal pain.  Prior history of abdominal aortic aneurysm repair and inguinal hernia repair.  ABDOMEN - 1 VIEW  Comparison: Visualized abdomen at the time of lumbar spine imaging 10/20/2008.  Findings: 2 adjacent loops of dilated small bowel in the right upper quadrant of the abdomen.  Gas and expected amount of stool throughout normal caliber colon.  No suggestion of free air on the supine image.  Extensive calcification involving the splenic artery.  Numerous  surgical clips in the abdomen and pelvis. Degenerative changes involving the lumbar spine.  IMPRESSION: Partial small bowel obstruction.   Original Report Authenticated By: Hulan Saas, M.D.     Scheduled Meds:   . clindamycin (CLEOCIN) IV  600 mg Intravenous Q8H  . metoprolol  5 mg Intravenous Q6H  . sodium chloride  3 mL Intravenous Q12H   Continuous Infusions:   . sodium chloride       Marinda Elk  [facility] Pager [phone]. If 8PM-8AM, please contact night-coverage at [email or web address], password [facility] 04/01/2012, 9:05 AM  LOS: 1 day

## 2012-04-01 NOTE — Progress Notes (Signed)
Films normal - no sign of obstruction  Clamp NG tube Dulcolax suppository - significant stool in colon.  Wilmon Arms. Corliss Skains, MD, [facility]  04/01/2012 10:27 AM

## 2012-04-02 ENCOUNTER — Encounter (HOSPITAL_COMMUNITY): Payer: Self-pay | Admitting: General Practice

## 2012-04-02 DIAGNOSIS — E119 Type 2 diabetes mellitus without complications: Secondary | ICD-10-CM

## 2012-04-02 LAB — BASIC METABOLIC PANEL
BUN: 18 mg/dL (ref 6–23)
Creatinine, Ser: 1.2 mg/dL (ref 0.50–1.35)
GFR calc Af Amer: 63 mL/min — ABNORMAL LOW (ref >90)
GFR calc non Af Amer: 54 mL/min — ABNORMAL LOW (ref >90)

## 2012-04-02 LAB — CBC
HCT: 32.2 % — ABNORMAL LOW (ref 39.0–52.0)
MCH: 33.2 pg (ref 26.0–34.0)
MCHC: 33.5 g/dL (ref 30.0–36.0)
MCV: 99.1 fL (ref 78.0–100.0)
RDW: 13.2 % (ref 11.5–15.5)
WBC: 6.5 10*3/uL (ref 4.0–10.5)

## 2012-04-02 LAB — HIV ANTIBODY (ROUTINE TESTING W REFLEX): HIV: NONREACTIVE

## 2012-04-02 MED ORDER — NIACIN 500 MG PO TABS
500.0000 mg | ORAL_TABLET | Freq: Every day | ORAL | Status: DC
  Administered 2012-04-02: 500 mg via ORAL
  Filled 2012-04-02 (×2): qty 1

## 2012-04-02 MED ORDER — ASPIRIN EC 81 MG PO TBEC
81.0000 mg | DELAYED_RELEASE_TABLET | Freq: Every day | ORAL | Status: DC
  Administered 2012-04-02: 81 mg via ORAL
  Filled 2012-04-02 (×2): qty 1

## 2012-04-02 MED ORDER — FEBUXOSTAT 40 MG PO TABS
80.0000 mg | ORAL_TABLET | Freq: Every day | ORAL | Status: DC
  Administered 2012-04-02 – 2012-04-03 (×2): 80 mg via ORAL
  Filled 2012-04-02 (×2): qty 2

## 2012-04-02 MED ORDER — ATORVASTATIN CALCIUM 20 MG PO TABS
20.0000 mg | ORAL_TABLET | Freq: Every day | ORAL | Status: DC
  Administered 2012-04-02: 20 mg via ORAL
  Filled 2012-04-02 (×2): qty 1

## 2012-04-02 MED ORDER — POLYETHYLENE GLYCOL 3350 17 G PO PACK
17.0000 g | PACK | ORAL | Status: DC | PRN
  Filled 2012-04-02: qty 1

## 2012-04-02 MED ORDER — PREDNISONE 2.5 MG PO TABS
2.5000 mg | ORAL_TABLET | Freq: Every day | ORAL | Status: DC
  Administered 2012-04-02 – 2012-04-03 (×2): 2.5 mg via ORAL
  Filled 2012-04-02 (×2): qty 1

## 2012-04-02 MED ORDER — ASPIRIN 81 MG PO TABS: 81.0000 mg | ORAL_TABLET | Freq: Every day | ORAL | Status: DC

## 2012-04-02 MED ORDER — GABAPENTIN 400 MG PO CAPS
800.0000 mg | ORAL_CAPSULE | Freq: Two times a day (BID) | ORAL | Status: DC
  Administered 2012-04-02 – 2012-04-03 (×3): 800 mg via ORAL
  Filled 2012-04-02 (×4): qty 2

## 2012-04-02 MED ORDER — FUROSEMIDE 40 MG PO TABS
40.0000 mg | ORAL_TABLET | Freq: Every day | ORAL | Status: DC
  Administered 2012-04-02 – 2012-04-03 (×2): 40 mg via ORAL
  Filled 2012-04-02 (×2): qty 1

## 2012-04-02 MED ORDER — GABAPENTIN 400 MG PO CAPS
800.0000 mg | ORAL_CAPSULE | Freq: Two times a day (BID) | ORAL | Status: DC
  Filled 2012-04-02: qty 2

## 2012-04-02 MED ORDER — ATENOLOL 25 MG PO TABS
25.0000 mg | ORAL_TABLET | Freq: Every day | ORAL | Status: DC
  Filled 2012-04-02: qty 1

## 2012-04-02 NOTE — Progress Notes (Signed)
Patient ID: Derrick Fry, male   DOB: 02-18-1928, 79 y.o.   MRN: [id number]    Subjective: Continues to improve, tolerating clear liquids well, had 2 BMs yesterday, +flatus, denies n/v, wants to eat   Objective: Vital signs in last 24 hours: Temp:  [99 F (37.2 C)-99.1 F (37.3 C)] 99 F (37.2 C) (01/28 0524) Pulse Rate:  [57-67] 57  (01/28 0524) Resp:  [16] 16  (01/28 0524) BP: (132-150)/(45-61) 132/45 mmHg (01/28 0524) SpO2:  [93 %-95 %] 95 % (01/28 0524) Weight:  [184 lb 8 oz (83.689 kg)] 184 lb 8 oz (83.689 kg) (01/28 0524) Last BM Date: 04/01/12  Intake/Output from previous day: 01/27 0701 - 01/28 0700 In: 1300 [I.V.:1200; IV Piggyback:100] Out: 453 [Urine:452; Stool:1] Intake/Output this shift:    PE: Abd: soft, nontender, +bs General: awake, alert, nad  Lab Results:   Basename 04/02/12 0515 04/01/12 0625  WBC 6.5 9.7  HGB 10.8* 12.0*  HCT 32.2* 35.8*  PLT 99* 121*   BMET  Basename 04/02/12 0515 04/01/12 0625  NA 143 144  K 3.7 4.0  CL 110 108  CO2 27 27  GLUCOSE 101* 152*  BUN 18 28*  CREATININE 1.20 1.37*  CALCIUM 9.0 9.2   PT/INR No results found for this basename: LABPROT:2,INR:2 in the last 72 hours CMP     Component Value Date/Time   NA 143 04/02/2012 0515   K 3.7 04/02/2012 0515   CL 110 04/02/2012 0515   CO2 27 04/02/2012 0515   GLUCOSE 101* 04/02/2012 0515   GLUCOSE 113 02/22/2009   BUN 18 04/02/2012 0515   CREATININE 1.20 04/02/2012 0515   CALCIUM 9.0 04/02/2012 0515   PROT 6.2 04/01/2012 0625   ALBUMIN 2.9* 04/01/2012 0625   AST 25 04/01/2012 0625   ALT 12 04/01/2012 0625   ALKPHOS 58 04/01/2012 0625   BILITOT 0.9 04/01/2012 0625   GFRNONAA 54* 04/02/2012 0515   GFRAA 63* 04/02/2012 0515   Lipase     Component Value Date/Time   LIPASE 41 03/31/2012 1059       Studies/Results: Dg Chest 2 View  03/31/2012  *RADIOLOGY REPORT*  Clinical Data: Fever.  CHEST - 2 VIEW  Comparison: PA and lateral chest 01/15/2012 and 10/18/2008.  Findings: NG  tube is in place with the side port at the gastroesophageal junction.  The tube should be advanced 3-4 cm. Heart size is normal.  No pneumothorax or pleural fluid.  Mild bibasilar atelectasis is noted.  IMPRESSION:  1.  No acute finding. 2.  NG tube should be advanced 3-4 cm for better positioning.   Original Report Authenticated By: Holley Dexter, M.D.    Dg Abd 1 View  03/31/2012  *RADIOLOGY REPORT*  Clinical Data: Nausea and vomiting.  Abdominal pain.  Prior history of abdominal aortic aneurysm repair and inguinal hernia repair.  ABDOMEN - 1 VIEW  Comparison: Visualized abdomen at the time of lumbar spine imaging 10/20/2008.  Findings: 2 adjacent loops of dilated small bowel in the right upper quadrant of the abdomen.  Gas and expected amount of stool throughout normal caliber colon.  No suggestion of free air on the supine image.  Extensive calcification involving the splenic artery.  Numerous surgical clips in the abdomen and pelvis. Degenerative changes involving the lumbar spine.  IMPRESSION: Partial small bowel obstruction.   Original Report Authenticated By: Hulan Saas, M.D.    Dg Abd Acute W/chest  04/01/2012  *RADIOLOGY REPORT*  Clinical Data: Small bowel obstruction.  Nasogastric  tube.  ACUTE ABDOMEN SERIES (ABDOMEN 2 VIEW & CHEST 1 VIEW)  Comparison: 03/31/2012  Findings: Orogastric tube tip overlies the level of the stomach. Patient has had median sternotomy and CABG.  The heart is enlarged. There is perihilar peribronchial thickening.  No focal consolidations or pleural effusions are identified.  Supine and erect views of the abdomen show moderate colonic stool. No small bowel dilatation identified.  Multiple surgical clips are identified throughout the abdomen.  No free intraperitoneal air.  Degenerative changes are seen within the spine.  IMPRESSION:  1.  Cardiomegaly without pulmonary edema. 2.  Nonobstructive bowel gas pattern.   Original Report Authenticated By: Norva Pavlov,  M.D.     Anti-infectives: Anti-infectives     Start     Dose/Rate Route Frequency Ordered Stop   03/31/12 1800   clindamycin (CLEOCIN) IVPB 600 mg        600 mg 100 mL/hr over 30 Minutes Intravenous 3 times per day 03/31/12 1645             Assessment/Plan 1. SBO: resolving, NGT out and no problems with clears, advance to soft and then as tolerated, could discharge late today if tolerating diet.  No need for acute surgical intervention  LOS: 2 days    Derrick Fry 04/02/2012

## 2012-04-02 NOTE — Progress Notes (Addendum)
[facility] PROGRESS NOTE  Assessment/Plan: Acute Thrombocytopenia (04/01/2012) - slowly trending down. 158-> 99. - no melena, no petechia's, positive FOBT. Hbg drop ? Hemo concentration as he was intravascularly depleted on admission. - HIT panel. No new medications, check HIV, no sign of sepsis, no recent viral illness, placed SCD's. - has drop  50%, since he got heparin no recent admissions, no thrombocysis  or skin necrosis,  AKI: - resolved with IV fluids. - peaked 1.3  Small bowel obstruction due to postoperative adhesions: - NG to intermittent suction about 200cc . leucocytosis improving, lactic acid resolving. - repeated abd x- ray showed resolving SBO pattern. 3 BM overnight. - monitor electrolytes. D/c clindamycin afebrile.  - d/c NG tube heart healthy diet.  Normocytic anemia (04/01/2012) - anemia panel as an outpatient. - Monitor Hbg  Code Status: Full  Family Communication: None at bedside  Disposition Plan: Inpatient    Consultants:  none  Procedures:  none  Antibiotics:  Clindamycin 1.26.2014  HPI/Subjective: Feels better hungry, tolerating diet  Objective: Filed Vitals:   04/01/12 0544 04/01/12 1200 04/01/12 1958 04/02/12 0524  BP: 149/66 150/51 149/61 132/45  Pulse: 66 66 67 57  Temp: 98.1 F (36.7 C)  99.1 F (37.3 C) 99 F (37.2 C)  TempSrc: Oral  Oral Oral  Resp: 16  16 16   Height:    5\' 9"  (1.753 m)  Weight:    83.689 kg (184 lb 8 oz)  SpO2: 97%  93% 95%    Intake/Output Summary (Last 24 hours) at 04/02/12 0855 Last data filed at 04/02/12 0526  Gross per 24 hour  Intake   1300 ml  Output    453 ml  Net    847 ml   Filed Weights   04/02/12 0524  Weight: 83.689 kg (184 lb 8 oz)    Exam:  General: Alert, awake, oriented x3, in no acute distress.  HEENT: No bruits, no goiter. NG tube Heart: Regular rate and rhythm, without murmurs, rubs, gallops.  Lungs: Good air movement, Clear to auscultation Abdomen: Soft,  nontender, nondistended, positive bowel sounds.  Neuro: Grossly intact, nonfocal.   Data Reviewed: Basic Metabolic Panel:  Lab 04/02/12 1610 04/01/12 0625 03/31/12 1717 03/31/12 1059  NA 143 144 -- 142  K 3.7 4.0 -- 3.8  CL 110 108 -- 96  CO2 27 27 -- 30  GLUCOSE 101* 152* -- 145*  BUN 18 28* -- 31*  CREATININE 1.20 1.37* 1.27 1.23  CALCIUM 9.0 9.2 -- 10.6*  MG -- -- -- --  PHOS -- -- -- --   Liver Function Tests:  Lab 04/01/12 0625 03/31/12 1059  AST 25 33  ALT 12 17  ALKPHOS 58 80  BILITOT 0.9 0.6  PROT 6.2 7.9  ALBUMIN 2.9* 3.9    Lab 03/31/12 1059  LIPASE 41  AMYLASE --   No results found for this basename: AMMONIA:5 in the last 168 hours CBC:  Lab 04/02/12 0515 04/01/12 0625 03/31/12 1717 03/31/12 1059  WBC 6.5 9.7 13.4* 14.3*  NEUTROABS -- -- 11.2* --  HGB 10.8* 12.0* 13.6 14.7  HCT 32.2* 35.8* 40.3 42.8  MCV 99.1 99.2 98.3 96.6  PLT 99* 121* 132* 158   Cardiac Enzymes: No results found for this basename: CKTOTAL:5,CKMB:5,CKMBINDEX:5,TROPONINI:5 in the last 168 hours BNP (last 3 results) No results found for this basename: PROBNP:3 in the last 8760 hours CBG:  Lab 04/02/12 0744  GLUCAP 98    Recent Results (from the past 240 hour(s))  CULTURE, BLOOD (ROUTINE X 2)     Status: Normal (Preliminary result)   Collection Time   03/31/12  5:18 PM      Component Value Range Status Comment   Specimen Description BLOOD RIGHT HAND   Final    Special Requests BOTTLES DRAWN AEROBIC ONLY 10CC   Final    Culture  Setup Time 04/01/2012 01:04   Final    Culture     Final    Value:        BLOOD CULTURE RECEIVED NO GROWTH TO DATE CULTURE WILL BE HELD FOR 5 DAYS BEFORE ISSUING A FINAL NEGATIVE REPORT   Report Status PENDING   Incomplete   CULTURE, BLOOD (ROUTINE X 2)     Status: Normal (Preliminary result)   Collection Time   03/31/12  5:30 PM      Component Value Range Status Comment   Specimen Description BLOOD RIGHT ARM   Final    Special Requests BOTTLES  DRAWN AEROBIC ONLY 10CC   Final    Culture  Setup Time 04/01/2012 01:04   Final    Culture     Final    Value:        BLOOD CULTURE RECEIVED NO GROWTH TO DATE CULTURE WILL BE HELD FOR 5 DAYS BEFORE ISSUING A FINAL NEGATIVE REPORT   Report Status PENDING   Incomplete      Studies: Dg Chest 2 View  03/31/2012  *RADIOLOGY REPORT*  Clinical Data: Fever.  CHEST - 2 VIEW  Comparison: PA and lateral chest 01/15/2012 and 10/18/2008.  Findings: NG tube is in place with the side port at the gastroesophageal junction.  The tube should be advanced 3-4 cm. Heart size is normal.  No pneumothorax or pleural fluid.  Mild bibasilar atelectasis is noted.  IMPRESSION:  1.  No acute finding. 2.  NG tube should be advanced 3-4 cm for better positioning.   Original Report Authenticated By: Holley Dexter, M.D.    Dg Abd 1 View  03/31/2012  *RADIOLOGY REPORT*  Clinical Data: Nausea and vomiting.  Abdominal pain.  Prior history of abdominal aortic aneurysm repair and inguinal hernia repair.  ABDOMEN - 1 VIEW  Comparison: Visualized abdomen at the time of lumbar spine imaging 10/20/2008.  Findings: 2 adjacent loops of dilated small bowel in the right upper quadrant of the abdomen.  Gas and expected amount of stool throughout normal caliber colon.  No suggestion of free air on the supine image.  Extensive calcification involving the splenic artery.  Numerous surgical clips in the abdomen and pelvis. Degenerative changes involving the lumbar spine.  IMPRESSION: Partial small bowel obstruction.   Original Report Authenticated By: Hulan Saas, M.D.    Dg Abd Acute W/chest  04/01/2012  *RADIOLOGY REPORT*  Clinical Data: Small bowel obstruction.  Nasogastric tube.  ACUTE ABDOMEN SERIES (ABDOMEN 2 VIEW & CHEST 1 VIEW)  Comparison: 03/31/2012  Findings: Orogastric tube tip overlies the level of the stomach. Patient has had median sternotomy and CABG.  The heart is enlarged. There is perihilar peribronchial thickening.  No focal  consolidations or pleural effusions are identified.  Supine and erect views of the abdomen show moderate colonic stool. No small bowel dilatation identified.  Multiple surgical clips are identified throughout the abdomen.  No free intraperitoneal air.  Degenerative changes are seen within the spine.  IMPRESSION:  1.  Cardiomegaly without pulmonary edema. 2.  Nonobstructive bowel gas pattern.   Original Report Authenticated By: Norva Pavlov, M.D.  Scheduled Meds:    . clindamycin (CLEOCIN) IV  600 mg Intravenous Q8H  . metoprolol  5 mg Intravenous Q6H  . sodium chloride  3 mL Intravenous Q12H   Continuous Infusions:    . sodium chloride 100 mL/hr at 04/01/12 2039     Marinda Elk  [facility] Pager [phone]. If 8PM-8AM, please contact night-coverage at [email or web address], password [email or web address] 04/02/2012, 8:55 AM  LOS: 2 days

## 2012-04-02 NOTE — Progress Notes (Signed)
Partial small bowel obstruction resolved.  Having bowel movements.  Advance diet as tolerated and discharge per primary team when medical issues stable. Will sign off.  Call us if needed.  Wilmon Arms. Corliss Skains, MD, [facility]  04/02/2012 7:57 AM

## 2012-04-03 LAB — CBC
Hemoglobin: 11.2 g/dL — ABNORMAL LOW (ref 13.0–17.0)
MCH: 33 pg (ref 26.0–34.0)
MCHC: 34 g/dL (ref 30.0–36.0)

## 2012-04-03 LAB — GLUCOSE, CAPILLARY: Glucose-Capillary: 126 mg/dL — ABNORMAL HIGH (ref 70–99)

## 2012-04-03 NOTE — Discharge Summary (Signed)
Physician Discharge Summary  Derrick Fry ZOX:[id number] DOB: January 21, 1928 DOA: 03/31/2012  PCP: Willow Ora, MD  Admit date: 03/31/2012 Discharge date: 04/03/2012  Time spent: 35 minutes  Follow up  CBC at follow up visit   Discharge Diagnoses:  Small bowel obstruction due to postoperative adhesions - resolved   GOUT  OBSTRUCTIVE SLEEP APNEA  SOLITARY KIDNEY, CONGENITAL  AAA (abdominal aortic aneurysm)  Thrombocytopenia - mild-improved by the time of the discharge  Normocytic anemia Acute kidney injury-mild-resolved with IV fluids   Discharge Condition: good  Diet recommendation: regular   Filed Weights   04/02/12 0524 04/03/12 0443  Weight: 83.689 kg (184 lb 8 oz) 83.4 kg (183 lb 13.8 oz)    History of present illness:  Derrick Fry is a 79 y.o. male CM who presented to the ED 1.26.14-he awoke at 03:30 with N/V and abd pain-he vomited everything he had. He had 3 episodes a large amount-it was initially like lasagna and looked like maybe brown/black coffee but it smelled like stool. Patient has a sample of it at bedside and it does appear feculent  He was [place]-town, about [place] of [place] and decided to come on the way he threw up 3 other times and isnt as nauseated or in as much pain-he did have a normal BM when he left this am from [place] prior to coming back to [place]-this has never happened before.  The abdominal pain seemed to come on at first-vomiting seemd to make this a little better for a little while  No fevers chills-states that abdominal pains or cramping like in nature with intermittent spasms.  He states he had lasagna and a salad which he bought form a grocery store-he also had a bowl of popcorn and ice-cream-rest of the people who ate this didn't seem to get sick   Hospital Course:   AKI:  - resolved with IV fluids.  - peaked 1.3  Small bowel obstruction due to postoperative adhesions:  - The patient had a NG tube placed to intermittent suction  about 200cc .  - repeated abd x- ray showed resolving SBO pattern. After nasogastric tube was removed the patient was able tolerate a regular diet and had normal bowel movements prior to discharge - The patient was followed daily by [facility] [place]  [facility].  Normocytic anemia  - Stable hemoglobin without signs of bleeding Acute Thrombocytopenia  - noted on January 27 Platelets slowly trended down. 158-> 99. - then they rebounded to 113 prior to discharge  - unclear cause - does not need further testing  Unless recurs   Procedures:  none  Consultations:  CCS  Discharge Exam: Filed Vitals:   04/02/12 1431 04/02/12 2100 04/03/12 0443 04/03/12 1028  BP: 142/58 151/68 132/57 157/70  Pulse: 67 52 56 57  Temp: 98.8 F (37.1 C) 98.7 F (37.1 C) 98.8 F (37.1 C)   TempSrc: Oral  Oral   Resp: 20 18 16    Height:      Weight:   83.4 kg (183 lb 13.8 oz)   SpO2: 100% 94% 96%     General: axox3 Cardiovascular: rrr Respiratory: ctab   Discharge Instructions  Discharge Orders    Future Appointments: Provider: Department: Dept Phone: Center:   07/15/2012 9:00 AM Wanda Plump, MD [facility]  [place] [phone] [facility]     Future Orders Please Complete By Expires   Diet - low sodium heart healthy      Increase activity slowly  Medication List     As of 04/03/2012 10:45 AM    TAKE these medications         acetaminophen 325 MG tablet   Commonly known as: TYLENOL   Take 325 mg by mouth 2 (two) times daily.      aspirin 81 MG tablet   Take 81 mg by mouth daily.      atenolol 25 MG tablet   Commonly known as: TENORMIN   Take 25 mg by mouth daily.      CENTRUM SILVER tablet   Take 1 tablet by mouth daily.      cyanocobalamin 100 MCG tablet   Take 100 mcg by mouth daily.      fish oil-omega-3 fatty acids 1000 MG capsule   Take 2 g by mouth daily.      Flaxseed Oil 1000 MG Caps   Take by mouth.      furosemide 40 MG tablet    Commonly known as: LASIX   Take 40 mg by mouth daily.      gabapentin 400 MG capsule   Commonly known as: NEURONTIN   Take 800 mg by mouth 2 (two) times daily.      glucosamine-chondroitin 500-400 MG tablet   Take 1 tablet by mouth 3 (three) times daily.      niacin 500 MG tablet   Take 500 mg by mouth at bedtime.      ONE TOUCH ULTRA TEST test strip   Generic drug: glucose blood   USE ONE EVERY DAY      oxyCODONE-acetaminophen 7.5-500 MG per tablet   Commonly known as: PERCOCET   Take 1 tablet by mouth 2 (two) times daily as needed for pain.      polyethylene glycol packet   Commonly known as: MIRALAX / GLYCOLAX   Take 17 g by mouth as needed.      predniSONE 5 MG tablet   Commonly known as: DELTASONE   Take 2.5 mg by mouth daily.      rosuvastatin 20 MG tablet   Commonly known as: CRESTOR   Take 20 mg by mouth daily.      ULORIC 40 MG tablet   Generic drug: febuxostat   Take 80 mg by mouth daily.           Follow-up Information    Follow up with Willow Ora, MD. In 3 weeks. (hospital follow up)    Contact information:   [address] [facility] [address] [place] [address] [phone]           The results of significant diagnostics from this hospitalization (including imaging, microbiology, ancillary and laboratory) are listed below for reference.    Significant Diagnostic Studies: Dg Chest 2 View  03/31/2012  *RADIOLOGY REPORT*  Clinical Data: Fever.  CHEST - 2 VIEW  Comparison: PA and lateral chest 01/15/2012 and 10/18/2008.  Findings: NG tube is in place with the side port at the gastroesophageal junction.  The tube should be advanced 3-4 cm. Heart size is normal.  No pneumothorax or pleural fluid.  Mild bibasilar atelectasis is noted.  IMPRESSION:  1.  No acute finding. 2.  NG tube should be advanced 3-4 cm for better positioning.   Original Report Authenticated By: Holley Dexter, M.D.    Dg Abd 1 View  03/31/2012  *RADIOLOGY REPORT*   Clinical Data: Nausea and vomiting.  Abdominal pain.  Prior history of abdominal aortic aneurysm repair and inguinal hernia repair.  ABDOMEN - 1 VIEW  Comparison: Visualized abdomen at the time of lumbar spine imaging 10/20/2008.  Findings: 2 adjacent loops of dilated small bowel in the right upper quadrant of the abdomen.  Gas and expected amount of stool throughout normal caliber colon.  No suggestion of free air on the supine image.  Extensive calcification involving the splenic artery.  Numerous surgical clips in the abdomen and pelvis. Degenerative changes involving the lumbar spine.  IMPRESSION: Partial small bowel obstruction.   Original Report Authenticated By: Hulan Saas, M.D.    Dg Abd Acute W/chest  04/01/2012  *RADIOLOGY REPORT*  Clinical Data: Small bowel obstruction.  Nasogastric tube.  ACUTE ABDOMEN SERIES (ABDOMEN 2 VIEW & CHEST 1 VIEW)  Comparison: 03/31/2012  Findings: Orogastric tube tip overlies the level of the stomach. Patient has had median sternotomy and CABG.  The heart is enlarged. There is perihilar peribronchial thickening.  No focal consolidations or pleural effusions are identified.  Supine and erect views of the abdomen show moderate colonic stool. No small bowel dilatation identified.  Multiple surgical clips are identified throughout the abdomen.  No free intraperitoneal air.  Degenerative changes are seen within the spine.  IMPRESSION:  1.  Cardiomegaly without pulmonary edema. 2.  Nonobstructive bowel gas pattern.   Original Report Authenticated By: Norva Pavlov, M.D.     Microbiology: Recent Results (from the past 240 hour(s))  CULTURE, BLOOD (ROUTINE X 2)     Status: Normal (Preliminary result)   Collection Time   03/31/12  5:18 PM      Component Value Range Status Comment   Specimen Description BLOOD RIGHT HAND   Final    Special Requests BOTTLES DRAWN AEROBIC ONLY 10CC   Final    Culture  Setup Time 04/01/2012 01:04   Final    Culture     Final    Value:         BLOOD CULTURE RECEIVED NO GROWTH TO DATE CULTURE WILL BE HELD FOR 5 DAYS BEFORE ISSUING A FINAL NEGATIVE REPORT   Report Status PENDING   Incomplete   CULTURE, BLOOD (ROUTINE X 2)     Status: Normal (Preliminary result)   Collection Time   03/31/12  5:30 PM      Component Value Range Status Comment   Specimen Description BLOOD RIGHT ARM   Final    Special Requests BOTTLES DRAWN AEROBIC ONLY 10CC   Final    Culture  Setup Time 04/01/2012 01:04   Final    Culture     Final    Value:        BLOOD CULTURE RECEIVED NO GROWTH TO DATE CULTURE WILL BE HELD FOR 5 DAYS BEFORE ISSUING A FINAL NEGATIVE REPORT   Report Status PENDING   Incomplete      Labs: Basic Metabolic Panel:  Lab 04/02/12 1610 04/01/12 0625 03/31/12 1717 03/31/12 1059  NA 143 144 -- 142  K 3.7 4.0 -- 3.8  CL 110 108 -- 96  CO2 27 27 -- 30  GLUCOSE 101* 152* -- 145*  BUN 18 28* -- 31*  CREATININE 1.20 1.37* 1.27 1.23  CALCIUM 9.0 9.2 -- 10.6*  MG -- -- -- --  PHOS -- -- -- --   Liver Function Tests:  Lab 04/01/12 0625 03/31/12 1059  AST 25 33  ALT 12 17  ALKPHOS 58 80  BILITOT 0.9 0.6  PROT 6.2 7.9  ALBUMIN 2.9* 3.9    Lab 03/31/12 1059  LIPASE 41  AMYLASE --  No results found for this basename: AMMONIA:5 in the last 168 hours CBC:  Lab 04/03/12 0913 04/02/12 0515 04/01/12 0625 03/31/12 1717 03/31/12 1059  WBC 7.2 6.5 9.7 13.4* 14.3*  NEUTROABS -- -- -- 11.2* --  HGB 11.2* 10.8* 12.0* 13.6 14.7  HCT 32.9* 32.2* 35.8* 40.3 42.8  MCV 97.1 99.1 99.2 98.3 96.6  PLT 113* 99* 121* 132* 158   Cardiac Enzymes: No results found for this basename: CKTOTAL:5,CKMB:5,CKMBINDEX:5,TROPONINI:5 in the last 168 hours BNP: BNP (last 3 results) No results found for this basename: PROBNP:3 in the last 8760 hours CBG:  Lab 04/03/12 0734 04/02/12 0744  GLUCAP 126* 98       Signed:  Glena Pharris  [facility] 04/03/2012, 10:45 AM

## 2012-04-03 NOTE — Progress Notes (Signed)
Pt discharged to home per MD order. Pt received and reviewed all discharge instructions and medication information including follow-up appointments and prescriptions.  Pt verbalized understanding.  Pt alert and oriented at discharge with no complaints of pain.  Pt escorted to private vehicle via wheelchair by guest services. [place], Para Cossey Elizabeth  

## 2012-04-05 ENCOUNTER — Telehealth: Payer: Self-pay | Admitting: Internal Medicine

## 2012-04-05 LAB — HEPARIN INDUCED THROMBOCYTOPENIA PNL
UFH Low Dose 0.1 IU/mL: 0 % Release
UFH Low Dose 0.5 IU/mL: 0 % Release
UFH SRA Result: NEGATIVE

## 2012-04-05 NOTE — Telephone Encounter (Signed)
Patinet was in the hospital with SBO 03/31/12- until 04/03/12. Patient states today is having a nausea feeling Please advise.

## 2012-04-05 NOTE — Telephone Encounter (Signed)
Recommend a liquid diet, frequent but small portions, gradually go back to more regular diet. If symptoms severe, abdominal pain, constipation, fever, vomiting: needs to be reassessed in the emergency room.

## 2012-04-05 NOTE — Telephone Encounter (Signed)
Discussed with pt

## 2012-04-05 NOTE — Telephone Encounter (Signed)
Patient Information:  Caller Name: Derrick Fry  Phone: [phone]  Patient: Derrick Fry, Derrick Fry  Gender: Male  DOB: 06-24-1927  Age: 79 Years  PCP: Willow Ora  Office Follow Up:  Does the office need to follow up with this patient?: Yes  Instructions For The Office: He would like something for nausea.  Vomiting Protocol reviewed.   Patient uses [facility] located off [facility].  PLEASE REVIEW WITH PHYSICIAN. RECENT HOSPITALIZATION FOR SBO. REQUESTING MEDICATION.  TODAY IS HIS BIRTHDAY!  RN Note:  He would like something for nausea.  Vomiting Protocol reviewed.   Patient uses [facility] located off [facility].  PLEASE REVIEW WITH PHYSICIAN. RECENT HOSPITALIZATION FOR SBO. REQUESTING MEDICATION.  TODAY IS HIS BIRTHDAY!  Symptoms  Reason For Call & Symptoms: Patinet was in the hospital with SBO 03/31/12- until 04/03/12.    Patient states today is having a nausea feeling. Onset yesterday 04/04/12.  He states will pass flatus and it relieves it temporarliy. He is eating but appetite decreased. Drinking + fluids. Last UOP - 40 minutes ago.  Last BM last night -firm but taking Miralax. No bloating, no tenderness. No vomiting.  Reviewed Health History In EMR: Yes  Reviewed Medications In EMR: Yes  Reviewed Allergies In EMR: Yes  Reviewed Surgeries / Procedures: Yes  Date of Onset of Symptoms: 04/04/2012  Guideline(s) Used:  Vomiting  Disposition Per Guideline:   Home Care  Reason For Disposition Reached:   Vomiting  Advice Given:  N/A  RN Overrode Recommendation:  Patient Requests Prescription  He would like something for nausea.  Vomiting Protocol reviewed.   Patient uses [facility] located off [facility].  PLEASE REVIEW WITH PHYSICIAN. RECENT HOSPITALIZATION FOR SBO. REQUESTING MEDICATION.  TODAY IS HIS BIRTHDAY!

## 2012-04-07 LAB — CULTURE, BLOOD (ROUTINE X 2): Culture: NO GROWTH

## 2012-04-22 ENCOUNTER — Telehealth: Payer: Self-pay | Admitting: *Deleted

## 2012-04-22 MED ORDER — OXYCODONE-ACETAMINOPHEN 7.5-500 MG PO TABS: 1.0000 | ORAL_TABLET | Freq: Two times a day (BID) | ORAL | Status: DC | PRN

## 2012-04-22 NOTE — Telephone Encounter (Signed)
Called pt to make aware that percocet script ready for pick up.

## 2012-04-22 NOTE — Telephone Encounter (Signed)
Patient called triage requesting refill on percocet. Last OV was 01/15/12, acute with last fill on 03/20/12 #60 no refills. Contract on file with patient being in low risk. Ok to refill?

## 2012-04-22 NOTE — Telephone Encounter (Signed)
done

## 2012-04-24 ENCOUNTER — Encounter: Payer: Self-pay | Admitting: Internal Medicine

## 2012-04-24 ENCOUNTER — Ambulatory Visit (INDEPENDENT_AMBULATORY_CARE_PROVIDER_SITE_OTHER): Payer: Medicare Other | Admitting: Internal Medicine

## 2012-04-24 VITALS — BP 132/72 | HR 50 | Wt 184.0 lb

## 2012-04-24 DIAGNOSIS — I10 Essential (primary) hypertension: Secondary | ICD-10-CM

## 2012-04-24 DIAGNOSIS — N189 Chronic kidney disease, unspecified: Secondary | ICD-10-CM

## 2012-04-24 DIAGNOSIS — K913 Postprocedural intestinal obstruction, unspecified as to partial versus complete: Secondary | ICD-10-CM

## 2012-04-24 DIAGNOSIS — D696 Thrombocytopenia, unspecified: Secondary | ICD-10-CM

## 2012-04-24 DIAGNOSIS — D649 Anemia, unspecified: Secondary | ICD-10-CM

## 2012-04-24 DIAGNOSIS — K565 Intestinal adhesions [bands], unspecified as to partial versus complete obstruction: Secondary | ICD-10-CM

## 2012-04-24 LAB — BASIC METABOLIC PANEL
BUN: 42 mg/dL — ABNORMAL HIGH (ref 6–23)
Calcium: 9.8 mg/dL (ref 8.4–10.5)
GFR: 45.06 mL/min — ABNORMAL LOW (ref >60.00)
Glucose, Bld: 138 mg/dL — ABNORMAL HIGH (ref 70–99)
Sodium: 137 mEq/L (ref 135–145)

## 2012-04-24 LAB — CBC WITH DIFFERENTIAL/PLATELET
Basophils Absolute: 0 10*3/uL (ref 0.0–0.1)
Lymphocytes Relative: 17 % (ref 12.0–46.0)
Monocytes Relative: 8.2 % (ref 3.0–12.0)
Platelets: 152 10*3/uL (ref 150.0–400.0)
RDW: 13.4 % (ref 11.5–14.6)

## 2012-04-24 NOTE — Assessment & Plan Note (Signed)
Checking a CBC 

## 2012-04-24 NOTE — Assessment & Plan Note (Signed)
Mild anemia and thrombocytopenia while at the hospital. Recheck  a CBC.

## 2012-04-24 NOTE — Assessment & Plan Note (Signed)
Recheck a BMP 

## 2012-04-24 NOTE — Patient Instructions (Addendum)
Next visit in 2-3 months  

## 2012-04-24 NOTE — Progress Notes (Signed)
  Subjective:    Patient ID: Derrick Fry, male    DOB: 02/14/1928, 79 y.o.   MRN: [id number]  HPI Hospital followup Admitted 03/31/2012 for 3 days with SBO, he was treated conservatively, symptoms resolve spontaneously. He is here for followup. Chart is reviewed, he developed anemia, also thrombocytopenia which was resolving by the time of discharge creatinine was checked several times and was actually < baseline. Last abdominal x-rays showed resolution of SBO-type changes.     Past Medical History  Diagnosis Date  . CAD (coronary artery disease)     MI 08-04-85  . Hyperlipidemia   . Peripheral neuropathy   . Hypertension   . Renal insufficiency     chronic w/ solitary kidney, congenital  . Depression   . Osteoarthritis   . Gout     diskitis 10/2008, Dr Dareen Piano  . Gait abnormality     chronic imbalance  . AAA (abdominal aortic aneurysm)   . Pneumonia     had right pneumonia pleurisy requiring resection of ribs and chest tube drainage at age 29  . Complication of anesthesia   . Small bowel obstruction 04/01/2012  . OSA (obstructive sleep apnea)     Limited CPAP tolerance  . Diabetes mellitus with neuropathy     Past Surgical History  Procedure Laterality Date  . Inguinal hernia repair  03/07/83  . Coronary artery bypass graft  01/04/86  . Total knee arthroplasty  05/04/89    left  . Cataract extraction  09/1999,04/2003    rt,left  . Knuckles replaced  05/2005    left hand  . 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 Abdominal aortic aneurysm repair    . Nephrectomy  1996  . US echocardiography  01/14/2007    EF 55-60%  . Cardiovascular stress test  10/13/2009    EF 57%    FH CAD-- B Stroke--  no DM-- B Cancer -- skin cancer F   Social History: Married, two children, retired Tobacco-- 1 ppd , quit in the 70s ETOH-- no   Review of Systems Since he left the hospital he feels well. Denies any fever or chills. No  nausea, vomiting, diarrhea. Appetite is good. No blood in the stools. Medication list is reviewed, it is the same except for his cholesterol medication, another physician change him from Crestor to Lipitor.     Objective:   Physical Exam General -- alert, well-developed Neck-- no LADs HEENT -- not pale or jaundice Lungs -- normal respiratory effort, no intercostal retractions, no accessory muscle use, and normal breath sounds.   Heart-- normal rate, regular rhythm, no murmur, and no gallop.   Abdomen-- soft, not distended, + BS, well healed surgical scars. Reducible hernia at the upper abdomen Neurologic-- alert & oriented X3 and strength normal in all extremities. Psych-- Cognition and judgment appear intact. Alert and cooperative with normal attention span and concentration.  not anxious appearing and not depressed appearing.       Assessment & Plan:

## 2012-04-24 NOTE — Assessment & Plan Note (Signed)
Well-controlled, no change 

## 2012-04-24 NOTE — Assessment & Plan Note (Signed)
Recently admitted to the hospital with SBO, treat conservatively, doing well. No further eval needed at this point.

## 2012-04-29 ENCOUNTER — Encounter: Payer: Self-pay | Admitting: *Deleted

## 2012-05-02 ENCOUNTER — Encounter: Payer: Self-pay | Admitting: Internal Medicine

## 2012-05-20 ENCOUNTER — Telehealth: Payer: Self-pay | Admitting: *Deleted

## 2012-05-20 MED ORDER — OXYCODONE-ACETAMINOPHEN 7.5-500 MG PO TABS: 1.0000 | ORAL_TABLET | Freq: Two times a day (BID) | ORAL | Status: DC | PRN

## 2012-05-20 NOTE — Telephone Encounter (Signed)
Done

## 2012-05-20 NOTE — Telephone Encounter (Signed)
Pt made aware rx is ready to be picked up at front desk.  

## 2012-05-20 NOTE — Telephone Encounter (Signed)
Last OV 04-24-12, last filled 04-22-12 #60

## 2012-06-17 ENCOUNTER — Telehealth: Payer: Self-pay | Admitting: *Deleted

## 2012-06-17 MED ORDER — OXYCODONE-ACETAMINOPHEN 7.5-500 MG PO TABS: 1.0000 | ORAL_TABLET | Freq: Two times a day (BID) | ORAL | Status: DC | PRN

## 2012-06-17 NOTE — Telephone Encounter (Signed)
Last OV 04-24-12, last filled 05-20-12 #60

## 2012-06-17 NOTE — Telephone Encounter (Signed)
done

## 2012-06-18 NOTE — Telephone Encounter (Signed)
Pt made aware rx is ready to be picked up at front desk.  

## 2012-07-11 ENCOUNTER — Telehealth: Payer: Self-pay | Admitting: Internal Medicine

## 2012-07-11 NOTE — Telephone Encounter (Signed)
Pt has an appt scheduled 5.12.14

## 2012-07-11 NOTE — Telephone Encounter (Signed)
Needs OV, please arrange 

## 2012-07-12 NOTE — Telephone Encounter (Signed)
thx

## 2012-07-15 ENCOUNTER — Ambulatory Visit (INDEPENDENT_AMBULATORY_CARE_PROVIDER_SITE_OTHER): Payer: Medicare Other | Admitting: Internal Medicine

## 2012-07-15 ENCOUNTER — Encounter: Payer: Self-pay | Admitting: Internal Medicine

## 2012-07-15 VITALS — BP 138/74 | HR 44 | Temp 97.4°F | Wt 182.0 lb

## 2012-07-15 DIAGNOSIS — I2510 Atherosclerotic heart disease of native coronary artery without angina pectoris: Secondary | ICD-10-CM

## 2012-07-15 DIAGNOSIS — E119 Type 2 diabetes mellitus without complications: Secondary | ICD-10-CM

## 2012-07-15 DIAGNOSIS — E785 Hyperlipidemia, unspecified: Secondary | ICD-10-CM

## 2012-07-15 DIAGNOSIS — N189 Chronic kidney disease, unspecified: Secondary | ICD-10-CM

## 2012-07-15 DIAGNOSIS — M1990 Unspecified osteoarthritis, unspecified site: Secondary | ICD-10-CM

## 2012-07-15 DIAGNOSIS — I10 Essential (primary) hypertension: Secondary | ICD-10-CM

## 2012-07-15 LAB — LIPID PANEL
Cholesterol: 139 mg/dL (ref 0–200)
HDL: 30.4 mg/dL — ABNORMAL LOW (ref >39.00)
LDL Cholesterol: 73 mg/dL (ref 0–99)
Triglycerides: 179 mg/dL — ABNORMAL HIGH (ref 0.0–149.0)

## 2012-07-15 LAB — BASIC METABOLIC PANEL
Calcium: 10.4 mg/dL (ref 8.4–10.5)
Glucose, Bld: 121 mg/dL — ABNORMAL HIGH (ref 70–99)

## 2012-07-15 MED ORDER — OXYCODONE-ACETAMINOPHEN 7.5-325 MG PO TABS: 1.0000 | ORAL_TABLET | Freq: Two times a day (BID) | ORAL | Status: DC | PRN

## 2012-07-15 MED ORDER — OXYCODONE-ACETAMINOPHEN 7.5-500 MG PO TABS: 1.0000 | ORAL_TABLET | Freq: Two times a day (BID) | ORAL | Status: DC | PRN

## 2012-07-15 NOTE — Patient Instructions (Addendum)
Atenolol 25 mg, take only half tablet daily. Keep an eye on your blood pressure, if it goes over 140/85 let me know. Call if your pulse continue going down in the 40s. Next visit here in 4-6 months

## 2012-07-15 NOTE — Progress Notes (Signed)
  Subjective:    Patient ID: Derrick Fry, male    DOB: [birth year], 79 y.o.   MRN: [id number]  HPI Routine office visit Hyperlipidemia, good medication compliance. Renal insufficiency, last creatinine slightly elevated, due for a BMP. History of gout, good compliance of medication including prednisone. SBO few months ago, currently asymptomatic. diabetes, check CBGs very rarely, does not recall any readings. CAD, to see cardiology soon, patient noted to be bradycardic, states pulse get to the 40s sometimes and he feels slightly weak.  Past Medical History  Diagnosis Date  . CAD (coronary artery disease)     MI 08-04-85  . Hyperlipidemia   . Peripheral neuropathy   . Hypertension   . Renal insufficiency     chronic w/ solitary kidney, congenital  . Depression   . Osteoarthritis   . Gout     diskitis 10/2008, Dr Dareen Piano  . Gait abnormality     chronic imbalance  . AAA (abdominal aortic aneurysm)   . Pneumonia     had right pneumonia pleurisy requiring resection of ribs and chest tube drainage at age 37  . Complication of anesthesia   . Small bowel obstruction 04/01/2012  . OSA (obstructive sleep apnea)     Limited CPAP tolerance  . Diabetes mellitus with neuropathy    Past Surgical History  Procedure Laterality Date  . Inguinal hernia repair  03/07/83  . Coronary artery bypass graft  01/04/86  . Total knee arthroplasty  05/04/89    left  . Cataract extraction  09/1999,04/2003    rt,left  . Knuckles replaced  05/2005    left hand  . 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 Abdominal aortic aneurysm repair    . Nephrectomy  1996  . US echocardiography  01/14/2007    EF 55-60%  . Cardiovascular stress test  10/13/2009    EF 57%     FH  CAD-- B  Stroke-- no  DM-- B  Cancer -- skin cancer F   Social History:  Married, two children, retired  Tobacco-- 1 ppd , quit in the 70s  ETOH-- no   Review of Systems Denies chest pain,  shortness of breath. No palpitations. Occasional lower extremity edema at the end of the day. No  nausea, vomiting, diarrhea.    Objective:   Physical Exam BP 138/74  Pulse 44  Temp(Src) 97.4 F (36.3 C) (Oral)  Wt 182 lb (82.555 kg)  BMI 26.86 kg/m2  SpO2 97%  General -- alert, well-developed, NAD    Lungs -- normal respiratory effort, no intercostal retractions, no accessory muscle use, and normal breath sounds.   Heart-- bradycardic, no murmur, and no gallop.   Extremities-- no pretibial edema bilaterally Neurologic-- alert & oriented X3 and strength normal in all extremities. Psych-- Cognition and judgment appear intact. Alert and cooperative with normal attention span and concentration.  not anxious appearing and not depressed appearing.      Assessment & Plan:

## 2012-07-15 NOTE — Assessment & Plan Note (Signed)
Labs creatinine is slightly elevated, creatinine varies up and down over time. Check a BMP

## 2012-07-15 NOTE — Assessment & Plan Note (Signed)
On the steroids for  gout, check a hemoglobin A1c

## 2012-07-15 NOTE — Assessment & Plan Note (Addendum)
On chronic pain medication, usually takes OxyContin twice a day, few months ago we did a urine drug days ago, he is low risk , refill today. A Rx for 7.5-500 was printed and destroyed, gave a new Rx for a 7.5 -325 mg

## 2012-07-15 NOTE — Assessment & Plan Note (Signed)
Good compliance with medications, due for a cholesterol panel

## 2012-07-15 NOTE — Assessment & Plan Note (Addendum)
States he is due for a cardiology visit, encouraged patient to call cards if an appointment is not scheduled

## 2012-07-15 NOTE — Assessment & Plan Note (Addendum)
Well-controlled, occasionally has bradycardia and feels  slightly weak. Plan: EKG at baseline Decrease Tenormin to half tablet twice a day (12.5 mg daily) Monitor her BP

## 2012-07-18 ENCOUNTER — Telehealth: Payer: Self-pay | Admitting: *Deleted

## 2012-07-18 DIAGNOSIS — N189 Chronic kidney disease, unspecified: Secondary | ICD-10-CM

## 2012-07-18 NOTE — Telephone Encounter (Signed)
Orders entered

## 2012-07-18 NOTE — Telephone Encounter (Signed)
Message copied by Nada Maclachlan on Thu Jul 18, 2012  3:58 PM ------      Message from: Willow Ora E      Created: Wed Jul 17, 2012  4:52 PM       Advise patient:      His blood sugar and cholesterol are well-controlled.      His kidney function continue to be slightly decreased------> please arrange a renal ultrasound dx CRI.      Plan is to continue monitoring his kidney function, avoid any Motrin-Advil -Aleve or similar OTCs as they can affect his kidney.      At some point may need to see nephrology. ------

## 2012-07-22 ENCOUNTER — Ambulatory Visit
Admission: RE | Admit: 2012-07-22 | Discharge: 2012-07-22 | Disposition: A | Discharge status: Home / Self Care | Payer: Medicare Other | Source: Ambulatory Visit | Attending: Internal Medicine | Admitting: Internal Medicine

## 2012-07-22 DIAGNOSIS — N189 Chronic kidney disease, unspecified: Secondary | ICD-10-CM

## 2012-08-09 ENCOUNTER — Telehealth: Payer: Self-pay | Admitting: Internal Medicine

## 2012-08-09 MED ORDER — OXYCODONE-ACETAMINOPHEN 7.5-325 MG PO TABS: 1.0000 | ORAL_TABLET | Freq: Two times a day (BID) | ORAL | Status: DC | PRN

## 2012-08-09 NOTE — Telephone Encounter (Signed)
Is slightly early to RF but the weekend is coming. RF done

## 2012-08-09 NOTE — Telephone Encounter (Signed)
Ok to refill Oxycodone 7.5-325? Last OV 5.12.14 Last filled 5.12.14

## 2012-08-09 NOTE — Telephone Encounter (Signed)
Patient is calling requesting a refill on his Oxycodone Rx. Wants to know if he can have it refilled for two months instead of one month. Please call when ready.

## 2012-08-09 NOTE — Telephone Encounter (Signed)
Pt aware Rx ready for pick up 

## 2012-08-29 ENCOUNTER — Encounter: Payer: Self-pay | Admitting: Cardiovascular Disease

## 2012-08-29 ENCOUNTER — Ambulatory Visit (INDEPENDENT_AMBULATORY_CARE_PROVIDER_SITE_OTHER): Payer: Medicare Other | Admitting: Cardiovascular Disease

## 2012-08-29 VITALS — BP 110/58 | HR 54 | Ht 68.5 in | Wt 183.0 lb

## 2012-08-29 DIAGNOSIS — I2510 Atherosclerotic heart disease of native coronary artery without angina pectoris: Secondary | ICD-10-CM

## 2012-08-29 NOTE — Patient Instructions (Addendum)
Your physician wants you to follow-up in: 1 year  You will receive a reminder letter in the mail two months in advance. If you don't receive a letter, please call our office to schedule the follow-up appointment.  Your physician recommends that you continue on your current medications as directed. Please refer to the Current Medication list given to you today.  

## 2012-08-29 NOTE — Assessment & Plan Note (Signed)
He remains stable.  No angina.  Continue current meds.

## 2012-08-29 NOTE — Progress Notes (Signed)
Derrick Fry Date of Birth  1927/05/07 [facility]     [place] [facility]  [address]    [address]   [address] [place], [place]  [address]    [place], [place]  [address] [phone]  Fax  [phone]  [phone]  Fax [phone]  Problem List: 1. CAD - s/p CABG ( 1987, [place]) 2.  AAA - repair 3. Hypertension 4. Hyperlipidemia    History of Present Illness:  Derrick Fry is an 79 y.o. gentleman with the above noted hx.  He does not exercise much because of his arthritis.  He is active in the summertime.  August 29, 2012:  Derrick Fry is doing well.  He did not plant a garden this year. He stays active doing yard work.  Lipids are check at his medical doctors office.   Current Outpatient Prescriptions on File Prior to Visit  Medication Sig Dispense Refill  . aspirin 81 MG tablet Take 81 mg by mouth daily.        Marland Kitchen atenolol (TENORMIN) 25 MG tablet Take 12.5 mg by mouth daily.       Marland Kitchen atorvastatin (LIPITOR) 80 MG tablet Take 80 mg by mouth daily.      . cyanocobalamin 100 MCG tablet Take 100 mcg by mouth daily.        . febuxostat (ULORIC) 40 MG tablet Take 40 mg by mouth daily.       . fish oil-omega-3 fatty acids 1000 MG capsule Take 2 g by mouth daily.        . Flaxseed, Linseed, (FLAXSEED OIL) 1000 MG CAPS Take by mouth.        . furosemide (LASIX) 40 MG tablet Take 40 mg by mouth daily.        Marland Kitchen gabapentin (NEURONTIN) 400 MG capsule Take 800 mg by mouth 2 (two) times daily.       Marland Kitchen glucosamine-chondroitin 500-400 MG tablet Take 1 tablet by mouth 3 (three) times daily.       . Multiple Vitamins-Minerals (CENTRUM SILVER) tablet Take 1 tablet by mouth daily.        . niacin 500 MG tablet Take 500 mg by mouth at bedtime.        . ONE TOUCH ULTRA TEST test strip USE ONE EVERY DAY  100 each  2  . oxyCODONE-acetaminophen (PERCOCET) 7.5-325 MG per tablet Take 1 tablet by mouth 2 (two) times daily as needed for pain.  60 tablet  0  . polyethylene glycol (MIRALAX /  GLYCOLAX) packet Take 17 g by mouth as needed.       . predniSONE (DELTASONE) 5 MG tablet Take 2.5 mg by mouth daily.        No current facility-administered medications on file prior to visit.    Allergies  Allergen Reactions  . Colchicine     Past Medical History  Diagnosis Date  . CAD (coronary artery disease)     MI 08-04-85  . Hyperlipidemia   . Peripheral neuropathy   . Hypertension   . Renal insufficiency     chronic w/ solitary kidney, congenital  . Depression   . Osteoarthritis   . Gout     diskitis 10/2008, Dr Dareen Piano  . Gait abnormality     chronic imbalance  . AAA (abdominal aortic aneurysm)   . Pneumonia     had right pneumonia pleurisy requiring resection of ribs and chest tube drainage at age 63  . Complication of anesthesia   .  Small bowel obstruction 04/01/2012  . OSA (obstructive sleep apnea)     Limited CPAP tolerance  . Diabetes mellitus with neuropathy     Past Surgical History  Procedure Laterality Date  . Inguinal hernia repair  03/07/83  . Coronary artery bypass graft  01/04/86  . Total knee arthroplasty  05/04/89    left  . Cataract extraction  09/1999,04/2003    rt,left  . Knuckles replaced  05/2005    left hand  . 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 Abdominal aortic aneurysm repair    . Nephrectomy  1996  . US echocardiography  01/14/2007    EF 55-60%  . Cardiovascular stress test  10/13/2009    EF 57%    History  Smoking status  . Former Smoker  . Quit date: 04/02/1972  Smokeless tobacco  . Never Used    History  Alcohol Use No    Comment: former heavy alcohol use    Family History  Problem Relation Age of Onset  . Lymphoma Sister   . Colon cancer Neg Hx   . Prostate cancer Neg Hx     Reviw of Systems:  Reviewed in the HPI.  All other systems are negative.  Physical Exam: Blood pressure 110/58, pulse 54, height 5' 8.5" (1.74 m), weight 183 lb (83.008 kg), SpO2  95.00%. General: Well developed, well nourished, in no acute distress.  Head: Normocephalic, atraumatic, sclera non-icteric, mucus membranes are moist,   Neck: Supple. Negative for carotid bruits. JVD not elevated.  Lungs: Clear bilaterally to auscultation without wheezes, rales, or rhonchi. Breathing is unlabored.  Heart: RRR with S1 S2.  There is a soft systolic murmur.    Abdomen: Soft, non-tender, non-distended with normoactive bowel sounds. No hepatomegaly. No rebound/guarding. No obvious abdominal masses.  Msk:  Strength and tone appear normal for age.  Extremities: No clubbing or cyanosis. No edema.  Distal pedal pulses are 2+ and equal bilaterally.  Neuro: Alert and oriented X 3. Moves all extremities spontaneously.  Psych:  Responds to questions appropriately with a normal affect.  ECG: NSR, LAFB, LVH.  Assessment / Plan:

## 2012-09-10 ENCOUNTER — Telehealth: Payer: Self-pay | Admitting: *Deleted

## 2012-09-10 NOTE — Telephone Encounter (Signed)
Ok #60 

## 2012-09-10 NOTE — Telephone Encounter (Signed)
.  Last OV 07-15-12, Last refilled 08-09-12 #60

## 2012-09-11 MED ORDER — OXYCODONE-ACETAMINOPHEN 7.5-325 MG PO TABS: 1.0000 | ORAL_TABLET | Freq: Two times a day (BID) | ORAL | Status: DC | PRN

## 2012-09-11 NOTE — Telephone Encounter (Signed)
Pt wife aware Rx ready for pick up.

## 2012-10-10 ENCOUNTER — Telehealth: Payer: Self-pay | Admitting: Internal Medicine

## 2012-10-10 NOTE — Telephone Encounter (Signed)
Patient needs a new Oxycodone rx. States that he left a message on the triage line earlier this week but wants to make sure this message reached Dr. Drue Novel.

## 2012-10-11 MED ORDER — OXYCODONE-ACETAMINOPHEN 7.5-325 MG PO TABS: 1.0000 | ORAL_TABLET | Freq: Two times a day (BID) | ORAL | Status: DC | PRN

## 2012-10-11 NOTE — Telephone Encounter (Signed)
Ok #60 

## 2012-10-11 NOTE — Telephone Encounter (Signed)
Ok to refill percocet? Last OV 5.12.14 Last filled 7.9.14 #60 no refills Contract on file. Low risk.

## 2012-10-11 NOTE — Telephone Encounter (Signed)
Pt. Made aware rx ready for pickup at his convenience.

## 2012-11-08 ENCOUNTER — Telehealth: Payer: Self-pay | Admitting: General Practice

## 2012-11-08 MED ORDER — OXYCODONE-ACETAMINOPHEN 7.5-325 MG PO TABS: 1.0000 | ORAL_TABLET | Freq: Two times a day (BID) | ORAL | Status: DC | PRN

## 2012-11-08 NOTE — Telephone Encounter (Signed)
Message left on triage line: Pt needs a refill on his oxycodone  Last OV 07-15-12 Med last filled 10-11-12 #60 with 0 refills.   Low Risk  Contract on File.

## 2012-11-08 NOTE — Telephone Encounter (Signed)
Advise patient, I just did a prescription for 180 tablets which is a three-month supply.

## 2012-11-08 NOTE — Telephone Encounter (Signed)
Left message on voice mail that script was faxed

## 2012-12-06 ENCOUNTER — Telehealth: Payer: Self-pay | Admitting: *Deleted

## 2012-12-06 MED ORDER — OXYCODONE-ACETAMINOPHEN 7.5-325 MG PO TABS: 1.0000 | ORAL_TABLET | Freq: Two times a day (BID) | ORAL | Status: DC | PRN

## 2012-12-06 NOTE — Telephone Encounter (Signed)
Pt is requesting medication refill for oxycodone-acetaminophen. Rx was last filled on 11/09/2012 for 180-day supply. Per patient pharmacy would not fill the the Rx because it is against there policy. So he requesting a Rx for this month. Pt was last seen on 07/15/2012. Please advise. SW

## 2012-12-06 NOTE — Telephone Encounter (Signed)
Derrick Fry, Advised patient, the pharmacy needs to give the prescription back to [facility] #60, Will need to call monthly and get the prescription

## 2012-12-10 NOTE — Telephone Encounter (Signed)
Pt notified via tele. DJR  

## 2012-12-16 ENCOUNTER — Other Ambulatory Visit: Payer: Self-pay | Admitting: Dermatology

## 2012-12-26 LAB — HM DIABETES EYE EXAM: HM Diabetic Eye Exam: NEGATIVE

## 2012-12-31 ENCOUNTER — Encounter: Payer: Self-pay | Admitting: Internal Medicine

## 2013-01-02 ENCOUNTER — Telehealth: Payer: Self-pay | Admitting: *Deleted

## 2013-01-02 MED ORDER — OXYCODONE-ACETAMINOPHEN 7.5-325 MG PO TABS: 1.0000 | ORAL_TABLET | Freq: Two times a day (BID) | ORAL | Status: DC | PRN

## 2013-01-02 NOTE — Telephone Encounter (Signed)
done

## 2013-01-02 NOTE — Telephone Encounter (Signed)
Last visit on 07/15/2012  Last filled on 12/06/2012  UDS on 03/21/2012, contract signed  Please advise. SW, CMA

## 2013-01-08 ENCOUNTER — Encounter: Payer: Self-pay | Admitting: Cardiology

## 2013-01-10 ENCOUNTER — Emergency Department (HOSPITAL_COMMUNITY)
Admission: EM | Admit: 2013-01-10 | Discharge: 2013-01-10 | Disposition: A | Discharge status: Home / Self Care | Payer: Medicare Other | Attending: Emergency Medicine | Admitting: Emergency Medicine

## 2013-01-10 ENCOUNTER — Encounter (HOSPITAL_COMMUNITY): Payer: Self-pay | Admitting: Emergency Medicine

## 2013-01-10 ENCOUNTER — Emergency Department (HOSPITAL_COMMUNITY): Payer: Medicare Other

## 2013-01-10 DIAGNOSIS — Z87448 Personal history of other diseases of urinary system: Secondary | ICD-10-CM | POA: Insufficient documentation

## 2013-01-10 DIAGNOSIS — K5289 Other specified noninfective gastroenteritis and colitis: Secondary | ICD-10-CM | POA: Insufficient documentation

## 2013-01-10 DIAGNOSIS — Z8701 Personal history of pneumonia (recurrent): Secondary | ICD-10-CM | POA: Insufficient documentation

## 2013-01-10 DIAGNOSIS — M109 Gout, unspecified: Secondary | ICD-10-CM | POA: Insufficient documentation

## 2013-01-10 DIAGNOSIS — Z87891 Personal history of nicotine dependence: Secondary | ICD-10-CM | POA: Insufficient documentation

## 2013-01-10 DIAGNOSIS — IMO0002 Reserved for concepts with insufficient information to code with codable children: Secondary | ICD-10-CM | POA: Insufficient documentation

## 2013-01-10 DIAGNOSIS — I10 Essential (primary) hypertension: Secondary | ICD-10-CM | POA: Insufficient documentation

## 2013-01-10 DIAGNOSIS — Z8659 Personal history of other mental and behavioral disorders: Secondary | ICD-10-CM | POA: Insufficient documentation

## 2013-01-10 DIAGNOSIS — E1149 Type 2 diabetes mellitus with other diabetic neurological complication: Secondary | ICD-10-CM | POA: Insufficient documentation

## 2013-01-10 DIAGNOSIS — G4733 Obstructive sleep apnea (adult) (pediatric): Secondary | ICD-10-CM | POA: Insufficient documentation

## 2013-01-10 DIAGNOSIS — Z951 Presence of aortocoronary bypass graft: Secondary | ICD-10-CM | POA: Insufficient documentation

## 2013-01-10 DIAGNOSIS — E785 Hyperlipidemia, unspecified: Secondary | ICD-10-CM | POA: Insufficient documentation

## 2013-01-10 DIAGNOSIS — M1990 Unspecified osteoarthritis, unspecified site: Secondary | ICD-10-CM | POA: Insufficient documentation

## 2013-01-10 DIAGNOSIS — I2510 Atherosclerotic heart disease of native coronary artery without angina pectoris: Secondary | ICD-10-CM | POA: Insufficient documentation

## 2013-01-10 DIAGNOSIS — K529 Noninfective gastroenteritis and colitis, unspecified: Secondary | ICD-10-CM

## 2013-01-10 DIAGNOSIS — E1142 Type 2 diabetes mellitus with diabetic polyneuropathy: Secondary | ICD-10-CM | POA: Insufficient documentation

## 2013-01-10 DIAGNOSIS — Z7982 Long term (current) use of aspirin: Secondary | ICD-10-CM | POA: Insufficient documentation

## 2013-01-10 LAB — URINALYSIS, ROUTINE W REFLEX MICROSCOPIC
Glucose, UA: NEGATIVE mg/dL
Hgb urine dipstick: NEGATIVE
Ketones, ur: NEGATIVE mg/dL
Leukocytes, UA: NEGATIVE
Nitrite: NEGATIVE
Protein, ur: NEGATIVE mg/dL
Specific Gravity, Urine: 1.014 (ref 1.005–1.030)

## 2013-01-10 LAB — COMPREHENSIVE METABOLIC PANEL
ALT: 26 U/L (ref 0–53)
AST: 49 U/L — ABNORMAL HIGH (ref 0–37)
Albumin: 3.4 g/dL — ABNORMAL LOW (ref 3.5–5.2)
Calcium: 9.7 mg/dL (ref 8.4–10.5)
Creatinine, Ser: 1.36 mg/dL — ABNORMAL HIGH (ref 0.50–1.35)
Potassium: 3.7 mEq/L (ref 3.5–5.1)
Sodium: 134 mEq/L — ABNORMAL LOW (ref 135–145)
Total Bilirubin: 0.5 mg/dL (ref 0.3–1.2)
Total Protein: 7.4 g/dL (ref 6.0–8.3)

## 2013-01-10 LAB — CBC WITH DIFFERENTIAL/PLATELET
Basophils Absolute: 0 10*3/uL (ref 0.0–0.1)
Basophils Relative: 0 % (ref 0–1)
Eosinophils Absolute: 0.1 10*3/uL (ref 0.0–0.7)
Eosinophils Relative: 1 % (ref 0–5)
Lymphocytes Relative: 19 % (ref 12–46)
MCH: 33 pg (ref 26.0–34.0)
MCHC: 34.4 g/dL (ref 30.0–36.0)
MCV: 95.8 fL (ref 78.0–100.0)
Neutrophils Relative %: 70 % (ref 43–77)
Platelets: 170 10*3/uL (ref 150–400)
RDW: 13.2 % (ref 11.5–15.5)
WBC: 7.8 10*3/uL (ref 4.0–10.5)

## 2013-01-10 LAB — POCT I-STAT TROPONIN I: Troponin i, poc: 0.01 ng/mL (ref 0.00–0.08)

## 2013-01-10 LAB — CLOSTRIDIUM DIFFICILE BY PCR: Toxigenic C. Difficile by PCR: NEGATIVE

## 2013-01-10 MED ORDER — ONDANSETRON HCL 4 MG/2ML IJ SOLN
4.0000 mg | Freq: Once | INTRAMUSCULAR | Status: AC
  Administered 2013-01-10: 4 mg via INTRAVENOUS
  Filled 2013-01-10: qty 2

## 2013-01-10 MED ORDER — ONDANSETRON HCL 4 MG PO TABS: 4.0000 mg | ORAL_TABLET | Freq: Four times a day (QID) | ORAL | Status: DC

## 2013-01-10 MED ORDER — IOHEXOL 300 MG/ML  SOLN
80.0000 mL | Freq: Once | INTRAMUSCULAR | Status: AC | PRN
  Administered 2013-01-10: 80 mL via INTRAVENOUS

## 2013-01-10 MED ORDER — IOHEXOL 300 MG/ML  SOLN
50.0000 mL | Freq: Once | INTRAMUSCULAR | Status: AC | PRN
  Administered 2013-01-10: 50 mL via ORAL

## 2013-01-10 MED ORDER — IOHEXOL 300 MG/ML  SOLN: 80.0000 mL | Freq: Once | INTRAMUSCULAR | Status: DC | PRN

## 2013-01-10 MED ORDER — SODIUM CHLORIDE 0.9 % IV BOLUS (SEPSIS)
500.0000 mL | Freq: Once | INTRAVENOUS | Status: AC
  Administered 2013-01-10: 500 mL via INTRAVENOUS

## 2013-01-10 MED ORDER — SODIUM CHLORIDE 0.9 % IV SOLN
INTRAVENOUS | Status: DC
  Administered 2013-01-10: 14:00:00 via INTRAVENOUS

## 2013-01-10 NOTE — ED Notes (Signed)
Patient with nausea, vomiting, and diarrhea for three days.  Patient reports stool dark reddish brown in color.  No abdominal pain. CBG-122.  Patient has a cardiac history.

## 2013-01-10 NOTE — ED Provider Notes (Signed)
TIME SEEN: 10:47 AM  CHIEF COMPLAINT: Nausea, vomiting, diarrhea  HPI: Patient is an 79 year old male with a history of hypertension, diabetes, hyperlipidemia, prior AAA repair in 1987, CAD status post CABG, prior bowel obstruction who presents to the emergency department with 3 days of nausea, vomiting and diarrhea. Patient reports that he has had very dark vomiting and dark diarrhea but does not describe it as coffee-ground or black, tarry stool. No bright red blood per rectum. He states his symptoms are similar to when he had his prior bowel obstruction but he is passing gas and has no abdominal distention. He has had chills but no fever. No sick contacts or recent travel. He was recently on amoxicillin for a dental infection. He states that he is having approximately 10-15 bowel movements a day. No prior history of C. difficile infection. Denies any chest pain or shortness of breath.  PCP is Dr. Drue Novel at [place]  ROS: See HPI Constitutional: no fever  Eyes: no drainage  ENT: no runny nose   Cardiovascular:  no chest pain  Resp: no SOB  GI: vomiting GU: no dysuria Integumentary: no rash  Allergy: no hives  Musculoskeletal: no leg swelling  Neurological: no slurred speech ROS otherwise negative  PAST MEDICAL HISTORY/PAST SURGICAL HISTORY:  Past Medical History  Diagnosis Date  . CAD (coronary artery disease)     MI 08-04-85  . Hyperlipidemia   . Peripheral neuropathy   . Hypertension   . Renal insufficiency     chronic w/ solitary kidney, congenital  . Depression   . Osteoarthritis   . Gout     diskitis 10/2008, Dr Dareen Piano  . Gait abnormality     chronic imbalance  . AAA (abdominal aortic aneurysm)   . Pneumonia     had right pneumonia pleurisy requiring resection of ribs and chest tube drainage at age 75  . Complication of anesthesia   . Small bowel obstruction 04/01/2012  . OSA (obstructive sleep apnea)     Limited CPAP tolerance  . Diabetes mellitus with neuropathy      MEDICATIONS:  Prior to Admission medications   Medication Sig Start Date End Date Taking? Authorizing Provider  aspirin 81 MG tablet Take 81 mg by mouth daily.    Yes Historical Provider, MD  atenolol (TENORMIN) 25 MG tablet Take 12.5 mg by mouth daily.    Yes Historical Provider, MD  atorvastatin (LIPITOR) 80 MG tablet Take 80 mg by mouth daily.   Yes Historical Provider, MD  cyanocobalamin 100 MCG tablet Take 100 mcg by mouth daily.    Yes Historical Provider, MD  febuxostat (ULORIC) 40 MG tablet Take 40 mg by mouth daily.    Yes Historical Provider, MD  fish oil-omega-3 fatty acids 1000 MG capsule Take 2 g by mouth daily.    Yes Historical Provider, MD  Flaxseed, Linseed, (FLAXSEED OIL) 1000 MG CAPS Take 1,000 mg by mouth 2 (two) times daily.    Yes Historical Provider, MD  furosemide (LASIX) 40 MG tablet Take 40 mg by mouth daily.    Yes Historical Provider, MD  gabapentin (NEURONTIN) 400 MG capsule Take 800 mg by mouth 2 (two) times daily.    Yes Historical Provider, MD  glucosamine-chondroitin 500-400 MG tablet Take 1 tablet by mouth 3 (three) times daily.    Yes Historical Provider, MD  Multiple Vitamins-Minerals (CENTRUM SILVER) tablet Take 1 tablet by mouth daily.    Yes Historical Provider, MD  niacin 500 MG tablet Take 500 mg  by mouth at bedtime.    Yes Historical Provider, MD  ONE TOUCH ULTRA TEST test strip USE ONE EVERY DAY 02/08/11  Yes Wanda Plump, MD  oxyCODONE-acetaminophen (PERCOCET) 7.5-325 MG per tablet Take 1 tablet by mouth 2 (two) times daily as needed for pain. 01/02/13  Yes Wanda Plump, MD  predniSONE (DELTASONE) 5 MG tablet Take 2.5 mg by mouth daily.    Yes Historical Provider, MD  polyethylene glycol (MIRALAX / GLYCOLAX) packet Take 17 g by mouth as needed for mild constipation or moderate constipation.     Historical Provider, MD    ALLERGIES:  Allergies  Allergen Reactions  . Colchicine     Unknown     SOCIAL HISTORY:  History  Substance Use Topics  .  Smoking status: Former Smoker    Quit date: 04/02/1972  . Smokeless tobacco: Never Used  . Alcohol Use: No     Comment: former heavy alcohol use    FAMILY HISTORY: Family History  Problem Relation Age of Onset  . Lymphoma Sister   . Colon cancer Neg Hx   . Prostate cancer Neg Hx     EXAM: There were no vitals taken for this visit. CONSTITUTIONAL: Alert and oriented and responds appropriately to questions. Well-appearing; well-nourished HEAD: Normocephalic EYES: Conjunctivae clear, PERRL ENT: normal nose; no rhinorrhea; moist mucous membranes; pharynx without lesions noted NECK: Supple, no meningismus, no LAD  CARD: RRR; S1 and S2 appreciated; no murmurs, no clicks, no rubs, no gallops RESP: Normal chest excursion without splinting or tachypnea; breath sounds clear and equal bilaterally; no wheezes, no rhonchi, no rales,  ABD/GI: Normal bowel sounds; non-distended; soft, non-tender, no rebound, no guarding RECTAL:  No prior blood per rectum, normal tone, liquid brown stool, minimally guaiac positive, no melena BACK:  The back appears normal and is non-tender to palpation, there is no CVA tenderness EXT: Normal ROM in all joints; non-tender to palpation; no edema; normal capillary refill; no cyanosis    SKIN: Normal color for age and race; warm NEURO: Moves all extremities equally PSYCH: The patient's mood and manner are appropriate. Grooming and personal hygiene are appropriate.  MEDICAL DECISION MAKING: Patient here with nausea, vomiting and diarrhea for 3 days. He has a prior history of small bowel obstruction and recently finished antibiotics for dental infection.  Will check abdominal labs and troponin, will obtain CT of his abdomen and pelvis given his persistent vomiting and diarrhea and his age and comorbidities, we'll obtain stool sample to evaluate for C. difficile.  ED PROGRESS: Labs unremarkable other than a slight elevation of lipase. Troponin negative. Urine shows no  sign of infection. CT scan pending.   Patient CT scan is unremarkable but does show signs consistent with gastroenteritis. No diverticulitis. Patient's C. difficile was negative. Suspect this is viral illness. He has been able to tolerate by mouth and has received IV fluids in the ED. We'll discharge home with usual and customary return precautions, prescription for Zofran. Patient has PCP followup next week. He verbalizes understanding and him and his wife are comfortable with this plan.  EKG Interpretation     Ventricular Rate:  72 PR Interval:  215 QRS Duration: 118 QT Interval:  392 QTC Calculation: 429 R Axis:   -63 Text Interpretation:  Sinus rhythm Multiform ventricular premature complexes Borderline prolonged PR interval Nonspecific IVCD with LAD Left ventricular hypertrophy Inferior infarct, old             Kristen N Ward, DO  01/10/13 1423 

## 2013-01-10 NOTE — ED Notes (Signed)
Bed: WA02 Expected date:  Expected time:  Means of arrival:  Comments: 

## 2013-01-11 LAB — URINE CULTURE

## 2013-01-15 ENCOUNTER — Telehealth (HOSPITAL_COMMUNITY): Payer: Self-pay

## 2013-01-15 NOTE — ED Notes (Signed)
Pt calling for stool cx results.  ID verified.  Pt informed results are negative.

## 2013-01-21 ENCOUNTER — Encounter: Payer: Medicare Other | Admitting: Internal Medicine

## 2013-01-28 ENCOUNTER — Telehealth: Payer: Self-pay

## 2013-01-28 NOTE — Telephone Encounter (Addendum)
LM with spouse for CB  Medication List and allergies: reviewed and updated  [facility] day [facility]: [facility] [address]  Immunizations due: UTD  A/P:   No changes to FH or PSH Tdap--2/ 2010  pneumonia shot 2007  Shingles shot--- @ the [place] per pt  colonoscopy in 2005, next colonoscopy 2010. Dr. Madilyn Fireman; patient reports he got a call and was told that cscope was optional. Decided not to go for another one  DRE 03-2010 ---normal, PSAs have been normal. No need for further prostate cancer screening  To Discuss with Provider: Not at this time

## 2013-01-29 ENCOUNTER — Ambulatory Visit (INDEPENDENT_AMBULATORY_CARE_PROVIDER_SITE_OTHER): Payer: Medicare Other | Admitting: Internal Medicine

## 2013-01-29 ENCOUNTER — Encounter: Payer: Self-pay | Admitting: Internal Medicine

## 2013-01-29 VITALS — BP 122/66 | HR 54 | Temp 97.7°F | Ht 67.5 in | Wt 179.0 lb

## 2013-01-29 DIAGNOSIS — E119 Type 2 diabetes mellitus without complications: Secondary | ICD-10-CM

## 2013-01-29 DIAGNOSIS — J8410 Pulmonary fibrosis, unspecified: Secondary | ICD-10-CM

## 2013-01-29 DIAGNOSIS — N189 Chronic kidney disease, unspecified: Secondary | ICD-10-CM

## 2013-01-29 DIAGNOSIS — M109 Gout, unspecified: Secondary | ICD-10-CM

## 2013-01-29 DIAGNOSIS — Z0000 Encounter for general adult medical examination without abnormal findings: Secondary | ICD-10-CM

## 2013-01-29 DIAGNOSIS — E785 Hyperlipidemia, unspecified: Secondary | ICD-10-CM

## 2013-01-29 DIAGNOSIS — I2510 Atherosclerotic heart disease of native coronary artery without angina pectoris: Secondary | ICD-10-CM

## 2013-01-29 DIAGNOSIS — I10 Essential (primary) hypertension: Secondary | ICD-10-CM

## 2013-01-29 DIAGNOSIS — G609 Hereditary and idiopathic neuropathy, unspecified: Secondary | ICD-10-CM

## 2013-01-29 HISTORY — DX: Pulmonary fibrosis, unspecified: J84.10

## 2013-01-29 LAB — LIPID PANEL
HDL: 35.6 mg/dL — ABNORMAL LOW (ref >39.00)
Total CHOL/HDL Ratio: 4
Triglycerides: 121 mg/dL (ref 0.0–149.0)
VLDL: 24.2 mg/dL (ref 0.0–40.0)

## 2013-01-29 MED ORDER — OXYCODONE-ACETAMINOPHEN 7.5-325 MG PO TABS: 1.0000 | ORAL_TABLET | Freq: Two times a day (BID) | ORAL | Status: DC | PRN

## 2013-01-29 NOTE — Assessment & Plan Note (Signed)
Patient has dry crackles on exam, recent abdominal CT done for a different issue showed "Interstitial fibrosis noted at the lung bases. The heart is normal ". I believe the patient had pulmonary fibrosis, she is not hypoxemic, able to do all his activities of daily living. We'll reassess periodically and consider a pulmonary referral

## 2013-01-29 NOTE — Assessment & Plan Note (Addendum)
Gout diagnosed 10-2008. he had a  L4 L5-S1 gout diskitis Followup by rheumatology, currently asymptomatic

## 2013-01-29 NOTE — Assessment & Plan Note (Signed)
Seems well-controlled, recent BMP normal 

## 2013-01-29 NOTE — Progress Notes (Signed)
Subjective:    Patient ID: Derrick Fry, male    DOB: December 21, 1927, 79 y.o.   MRN: [id number]  HPI Here for Medicare AWV:  1. Risk factors based on Past M, S, F history: reviewed  2. Physical Activities: active during the summer but less than before, sedentary during the winter --> counseled ? [employer]  3. Depression/mood: Neg screening   4. Hearing: Corrected w/ hearing aids which uses inconsistently  5. ADL's: Independent, still drives  6. Fall Risk: no recent falls, prevention discussed  7. home Safety: does feel safe at home  8. Height, weight, &visual acuity: see VS, vision stable, sees eye doctor regulalrly  9. Counseling: provided  10. Labs ordered based on risk factors: if needed  11. Referral Coordination: if needed  12. Care Plan, see assessment and plan  13. Cognitive Assessment: motor skill and cognition appropriate for age.   In addition, today we discussed the following: Neuropathy-- on gabapentin, still has occasional burning in the feet but overall feels satisfied with treatment. Gout -no symptoms, on chronic prednisone High cholesterol--good medication compliance without apparent side effects. Cardiovascular--saw cardiology 08-2012, felt to be stable   Past Medical History  Diagnosis Date  . CAD (coronary artery disease)     MI 08-04-85  . Hyperlipidemia   . Peripheral neuropathy   . Hypertension   . Renal insufficiency     chronic w/ solitary kidney, congenital  . Depression   . Osteoarthritis   . Gout     diskitis 10/2008, Dr Dareen Piano  . Gait abnormality     chronic imbalance  . AAA (abdominal aortic aneurysm)   . Pneumonia     had right pneumonia pleurisy requiring resection of ribs and chest tube drainage at age 38  . Complication of anesthesia   . Small bowel obstruction 04/01/2012  . OSA (obstructive sleep apnea)     Limited CPAP tolerance  . Diabetes mellitus with neuropathy    Past Surgical History  Procedure Laterality Date  . Inguinal hernia  repair  03/07/83  . Coronary artery bypass graft  01/04/86  . Total knee arthroplasty  05/04/89    left  . Cataract extraction  09/1999,04/2003    rt,left  . Knuckles replaced  05/2005    left hand  . 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 Abdominal aortic aneurysm repair    . Nephrectomy  1996  . US echocardiography  01/14/2007    EF 55-60%  . Cardiovascular stress test  10/13/2009    EF 57%     FH   CAD-- B   Stroke-- no   DM-- B   Cancer -- skin cancer F   Social History:   Married, lives w/ wife, two children (lost 1 child 2010, 1 daughter in [place]), retired   Tobacco-- 1 ppd , quit in the 39s   ETOH-- no   Review of Systems  No  CP, SOB, lower extremity edema Denies  nausea, vomiting diarrhea Denies  blood in the stools (-) cough, sputum production, (-) wheezing, chest congestion No dysuria, gross hematuria, difficulty urinating         Objective:   Physical Exam BP 122/66  Pulse 54  Temp(Src) 97.7 F (36.5 C)  Ht 5' 7.5" (1.715 m)  Wt 179 lb (81.194 kg)  BMI 27.61 kg/m2  SpO2 95% General -- alert, well-developed, NAD.  Neck --no thyromegaly , normal carotid pulse  Lungs -- normal respiratory effort, no intercostal retractions, no accessory muscle use, and dry crackles at bases  .  Heart-- brady , occ skip heart beat  Abdomen-- Not distended, good bowel sounds,soft, non-tender.  Extremities-- no pretibial edema bilaterally  Neurologic--  alert & oriented X3. Speech normal, gait normal, strength normal in all extremities.  Psych-- Cognition and judgment appear intact. Cooperative with normal attention span and concentration. No anxious appearing , no depressed appearing.     Assessment & Plan:

## 2013-01-29 NOTE — Assessment & Plan Note (Signed)
Check a FLP

## 2013-01-29 NOTE — Assessment & Plan Note (Signed)
Last A1c is stable, reassess on return to the office

## 2013-01-29 NOTE — Assessment & Plan Note (Addendum)
Symptoms not completely clear w/ gabapentin but patient is satisfied with results, no change. Refill OxyContin as needed ( after the patient left the office, I realized we don't have a8 UDS, will obtain in few months.)

## 2013-01-29 NOTE — Progress Notes (Signed)
Pre visit review using our clinic review tool, if applicable. No additional management support is needed unless otherwise documented below in the visit note. 

## 2013-01-29 NOTE — Assessment & Plan Note (Signed)
Saw cardiology few months ago, felt to be stable from the cardiovascular standpoint

## 2013-01-29 NOTE — Patient Instructions (Signed)
Get your blood work before you leave  Next visit in 6 months   for a   follow up (30 minutes) No Fasting Please make an appointment      Fall Prevention and Home Safety Falls cause injuries and can affect all age groups. It is possible to use preventive measures to significantly decrease the likelihood of falls. There are many simple measures which can make your home safer and prevent falls. OUTDOORS  Repair cracks and edges of walkways and driveways.  Remove high doorway thresholds.  Trim shrubbery on the main path into your home.  Have good outside lighting.  Clear walkways of tools, rocks, debris, and clutter.  Check that handrails are not broken and are securely fastened. Both sides of steps should have handrails.  Have leaves, snow, and ice cleared regularly.  Use sand or salt on walkways during winter months.  In the garage, clean up grease or oil spills. BATHROOM  Install night lights.  Install grab bars by the toilet and in the tub and shower.  Use non-skid mats or decals in the tub or shower.  Place a plastic non-slip stool in the shower to sit on, if needed.  Keep floors dry and clean up all water on the floor immediately.  Remove soap buildup in the tub or shower on a regular basis.  Secure bath mats with non-slip, double-sided rug tape.  Remove throw rugs and tripping hazards from the floors. BEDROOMS  Install night lights.  Make sure a bedside light is easy to reach.  Do not use oversized bedding.  Keep a telephone by your bedside.  Have a firm chair with side arms to use for getting dressed.  Remove throw rugs and tripping hazards from the floor. KITCHEN  Keep handles on pots and pans turned toward the center of the stove. Use back burners when possible.  Clean up spills quickly and allow time for drying.  Avoid walking on wet floors.  Avoid hot utensils and knives.  Position shelves so they are not too high or low.  Place commonly  used objects within easy reach.  If necessary, use a sturdy step stool with a grab bar when reaching.  Keep electrical cables out of the way.  Do not use floor polish or wax that makes floors slippery. If you must use wax, use non-skid floor wax.  Remove throw rugs and tripping hazards from the floor. STAIRWAYS  Never leave objects on stairs.  Place handrails on both sides of stairways and use them. Fix any loose handrails. Make sure handrails on both sides of the stairways are as long as the stairs.  Check carpeting to make sure it is firmly attached along stairs. Make repairs to worn or loose carpet promptly.  Avoid placing throw rugs at the top or bottom of stairways, or properly secure the rug with carpet tape to prevent slippage. Get rid of throw rugs, if possible.  Have an electrician put in a light switch at the top and bottom of the stairs. OTHER FALL PREVENTION TIPS  Wear low-heel or rubber-soled shoes that are supportive and fit well. Wear closed toe shoes.  When using a stepladder, make sure it is fully opened and both spreaders are firmly locked. Do not climb a closed stepladder.  Add color or contrast paint or tape to grab bars and handrails in your home. Place contrasting color strips on first and last steps.  Learn and use mobility aids as needed. Install an electrical emergency response   system.  Turn on lights to avoid dark areas. Replace light bulbs that burn out immediately. Get light switches that glow.  Arrange furniture to create clear pathways. Keep furniture in the same place.  Firmly attach carpet with non-skid or double-sided tape.  Eliminate uneven floor surfaces.  Select a carpet pattern that does not visually hide the edge of steps.  Be aware of all pets. OTHER HOME SAFETY TIPS  Set the water temperature for 120 F (48.8 C).  Keep emergency numbers on or near the telephone.  Keep smoke detectors on every level of the home and near sleeping  areas. Document Released: 02/10/2002 Document Revised: 08/22/2011 Document Reviewed: 05/12/2011 [employer] Patient Information 2014 [employer].  

## 2013-01-29 NOTE — Assessment & Plan Note (Signed)
Labs BMP is stable, no change

## 2013-01-29 NOTE — Assessment & Plan Note (Addendum)
Td 2010  pneumonia shot 2007  Shingles shot--- @ the [place] per pt  colonoscopy in 2005, report reviewed, next colonoscopy 2010. Dr. Madilyn Fireman; patient reports he got a call and was told that cscope was optional. Decided not to go for another one  DRE 03-2010 ---normal, PSAs have been normal. No need for further prostate cancer screening, today pt states he is in agreement.  Doing very well, diet, exercise and fall prevention discussed.

## 2013-02-05 ENCOUNTER — Encounter: Payer: Self-pay | Admitting: *Deleted

## 2013-03-04 ENCOUNTER — Telehealth: Payer: Self-pay | Admitting: *Deleted

## 2013-03-04 MED ORDER — OXYCODONE-ACETAMINOPHEN 7.5-325 MG PO TABS: 1.0000 | ORAL_TABLET | Freq: Two times a day (BID) | ORAL | Status: DC | PRN

## 2013-03-04 NOTE — Telephone Encounter (Signed)
Patient is requesting refill on oxycodone.  Last seen-01/29/2013  Last filled-01/29/2013  UDS-due, contract signed   Please advise. SW

## 2013-03-04 NOTE — Telephone Encounter (Signed)
Rx printed and patient notified 

## 2013-03-04 NOTE — Addendum Note (Signed)
Addended by: Dion Body on: 03/04/2013 09:43 AM   Modules accepted: Orders

## 2013-03-04 NOTE — Telephone Encounter (Signed)
Ok # 60 , 1 bid prn

## 2013-03-19 ENCOUNTER — Telehealth: Payer: Self-pay

## 2013-03-19 NOTE — Telephone Encounter (Signed)
UDS: 03/10/2013 Positive Low risk- per Dr Larose Kells

## 2013-04-02 ENCOUNTER — Telehealth: Payer: Self-pay | Admitting: *Deleted

## 2013-04-02 NOTE — Telephone Encounter (Signed)
UDS low risk 03/10/2013, please print a prescription I will sign tomorrow.

## 2013-04-02 NOTE — Telephone Encounter (Signed)
oxyCODONE-acetaminophen (PERCOCET) 7.5-325 MG per tablet Last refill: 03/04/13 #60, 0 refills Last OV: 01/29/13 Contract on file

## 2013-04-03 ENCOUNTER — Other Ambulatory Visit: Payer: Self-pay | Admitting: *Deleted

## 2013-04-03 MED ORDER — OXYCODONE-ACETAMINOPHEN 7.5-325 MG PO TABS: 1.0000 | ORAL_TABLET | Freq: Two times a day (BID) | ORAL | Status: DC | PRN

## 2013-04-03 NOTE — Telephone Encounter (Signed)
Script printed, awaiting signature. JG//CMA 

## 2013-05-08 ENCOUNTER — Telehealth: Payer: Self-pay | Admitting: Internal Medicine

## 2013-05-08 MED ORDER — OXYCODONE-ACETAMINOPHEN 7.5-325 MG PO TABS: 1.0000 | ORAL_TABLET | Freq: Two times a day (BID) | ORAL | Status: DC | PRN

## 2013-05-08 NOTE — Telephone Encounter (Signed)
He is on oxycodone, rx printed

## 2013-05-08 NOTE — Telephone Encounter (Signed)
UDS: 03/10/2013 Low Risk Last refill on oxycodone 04/03/2013 Last OV 01/29/2013 He doesn't take hydrocodone but does take Percocet

## 2013-05-08 NOTE — Telephone Encounter (Signed)
Patient's wife states the patient has left 2 messages to get his hydrocodone refilled and no one has called him back. Please call [phone] when ready for pick up.

## 2013-05-08 NOTE — Telephone Encounter (Signed)
Pt notified of rx rdy for pick up at front desk.

## 2013-05-28 ENCOUNTER — Ambulatory Visit (INDEPENDENT_AMBULATORY_CARE_PROVIDER_SITE_OTHER): Payer: Medicare HMO | Admitting: Nurse Practitioner

## 2013-05-28 ENCOUNTER — Other Ambulatory Visit: Payer: Self-pay | Admitting: *Deleted

## 2013-05-28 ENCOUNTER — Encounter: Payer: Self-pay | Admitting: Nurse Practitioner

## 2013-05-28 ENCOUNTER — Encounter: Payer: Self-pay | Admitting: Internal Medicine

## 2013-05-28 VITALS — BP 150/70 | HR 49 | Temp 98.0°F | Ht 67.5 in | Wt 186.6 lb

## 2013-05-28 DIAGNOSIS — H60399 Other infective otitis externa, unspecified ear: Secondary | ICD-10-CM

## 2013-05-28 DIAGNOSIS — H9319 Tinnitus, unspecified ear: Secondary | ICD-10-CM

## 2013-05-28 DIAGNOSIS — H6120 Impacted cerumen, unspecified ear: Secondary | ICD-10-CM

## 2013-05-28 DIAGNOSIS — H6091 Unspecified otitis externa, right ear: Secondary | ICD-10-CM

## 2013-05-28 LAB — EAR WAX REMOVAL: Other: POSITIVE

## 2013-05-28 MED ORDER — NEOMYCIN-POLYMYXIN-HC 1 % OT SOLN: 3.0000 [drp] | Freq: Four times a day (QID) | OTIC | Status: DC

## 2013-05-28 NOTE — Patient Instructions (Addendum)
Start using antibiotic ear drop in right ear because you have a small sore in the canal from the wax build up. Please follow up in one week for ear re-check. You still have some hard wax in the right ear. The antibiotic drop will soften it. For 1 week-while using the antibiotic drops, keep Right ear dry. So put cotton ball in ear when getting in shower. Remove when getting out. Consider hearing test if buzzing not relieved.  Cerumen Impaction A cerumen impaction is when the wax in your ear forms a plug. This plug usually causes reduced hearing. Sometimes it also causes an earache or dizziness. Removing a cerumen impaction can be difficult and painful. The wax sticks to the ear canal. The canal is sensitive and bleeds easily. If you try to remove a heavy wax buildup with a cotton tipped swab, you may push it in further. Irrigation with water, suction, and small ear curettes may be used to clear out the wax. If the impaction is fixed to the skin in the ear canal, ear drops may be needed for a few days to loosen the wax. People who build up a lot of wax frequently can use ear wax removal products available in your local drugstore. SEEK MEDICAL CARE IF:  You develop an earache, increased hearing loss, or marked dizziness. Document Released: 03/30/2004 Document Revised: 05/15/2011 Document Reviewed: 05/20/2009 [facility] Patient Information 2014 [place], [place].

## 2013-05-28 NOTE — Progress Notes (Signed)
Pre visit review using our clinic review tool, if applicable. No additional management support is needed unless otherwise documented below in the visit note. 

## 2013-05-28 NOTE — Progress Notes (Signed)
   Subjective:    Patient ID: Derrick Fry, male    DOB: Jul 05, 1927, 80 y.o.   MRN: [id number]  Otalgia  There is pain in the right (pt c/o buzzing in R ear, no pain.) ear. This is a new problem. The current episode started 1 to 4 weeks ago (2 weeks). The problem occurs constantly. The problem has been unchanged. There has been no fever. The patient is experiencing no pain. Pertinent negatives include no abdominal pain, coughing, diarrhea, drainage, ear discharge, headaches, hearing loss, rash, sore throat or vomiting. He has tried ear drops for the symptoms. The treatment provided no relief.      Review of Systems  Constitutional: Negative for fever, chills, activity change, appetite change and fatigue.  HENT: Positive for congestion (mild, nasal) and ear pain. Negative for ear discharge, hearing loss and sore throat.   Respiratory: Negative for cough.   Gastrointestinal: Negative for nausea, vomiting, abdominal pain and diarrhea.  Musculoskeletal: Negative for back pain.  Skin: Negative for rash.  Neurological: Negative for dizziness, light-headedness and headaches.       Objective:   Physical Exam  Vitals reviewed. Constitutional: He is oriented to person, place, and time. He appears well-developed and well-nourished. No distress.  HENT:  Head: Normocephalic and atraumatic.  Right Ear: External ear normal.  Left Ear: External ear normal.  Mouth/Throat: Oropharynx is clear and moist. No oropharyngeal exudate.  Vessels L TM mildly injected. Clear fluid, bones visible. R TM occluded by cerumen.  Eyes: Conjunctivae are normal. Right eye exhibits no discharge. Left eye exhibits no discharge.  Neck: Normal range of motion. Neck supple. No thyromegaly present.  Cardiovascular: Normal rate, regular rhythm and normal heart sounds.   No murmur heard. Pulmonary/Chest: Effort normal and breath sounds normal. No respiratory distress. He has no wheezes. He has no rales.  Lymphadenopathy:   He has no cervical adenopathy.  Neurological: He is alert and oriented to person, place, and time.  Skin: Skin is warm and dry.  Psychiatric: He has a normal mood and affect. His behavior is normal. Thought content normal.          Assessment & Plan:   1. Ceruminosis R ear. Water flush in ofc. Curette used. Plug removed. Still has remaining plug. Canal red w/ small abrasion.  Cortisporin drops for 1 week.  2. Buzzing in ear w/decreased hearing Pt states this is his worse ear & uses hearing aid in this ear.  May be r/t ceruminosis. If not resolved, consider hearing testing.

## 2013-05-28 NOTE — Addendum Note (Signed)
Addended by: Julieta Bellini on: 05/28/2013 11:55 AM   Modules accepted: Orders

## 2013-05-28 NOTE — Telephone Encounter (Signed)
Refill request for Percocet Last filled by MD on - 05/08/2013 #60 x0 Last Appt: 01/29/2013 Next Appt: 08/31/2013 Please advise refill?

## 2013-05-30 ENCOUNTER — Telehealth: Payer: Self-pay | Admitting: *Deleted

## 2013-05-30 NOTE — Telephone Encounter (Signed)
rx refill- oxycodone 7.5-325mg  Last OV-01/29/13 Last refilled- 05/08/13 #60 / 0 rf  UDS-03/05/13 LOW risk

## 2013-05-30 NOTE — Telephone Encounter (Signed)
Too early

## 2013-05-30 NOTE — Telephone Encounter (Signed)
Pt called to check to see if refill was ready to be picked up.  Told pt someone would call him back and let him know, that I did not see it was ready.  Please advise.  bw

## 2013-06-02 NOTE — Telephone Encounter (Signed)
Pt.notified

## 2013-06-04 ENCOUNTER — Encounter: Payer: Self-pay | Admitting: Nurse Practitioner

## 2013-06-04 ENCOUNTER — Ambulatory Visit (INDEPENDENT_AMBULATORY_CARE_PROVIDER_SITE_OTHER): Payer: Medicare HMO | Admitting: Nurse Practitioner

## 2013-06-04 VITALS — BP 130/60 | HR 94 | Temp 97.6°F | Wt 182.0 lb

## 2013-06-04 DIAGNOSIS — H9319 Tinnitus, unspecified ear: Secondary | ICD-10-CM

## 2013-06-04 DIAGNOSIS — H6120 Impacted cerumen, unspecified ear: Secondary | ICD-10-CM

## 2013-06-04 NOTE — Progress Notes (Signed)
Subjective:    Derrick Fry is a 80 y.o. male presents for follow up of ceruminosis and ear buzzing. He was seen in office last week. Ear was irrigated and currette was used to remove cerumen plug. Ear canal was red with abrasion. Cortisporin drops were prescribed. Today he presents with persistent buzzing. He wears hearing aids & states his R ear is the worst (hearing).  The patient's history has been marked as reviewed and updated as appropriate.  Review of Systems Pertinent items are noted in HPI.    Objective:    Auditory canal(s) of the right ear is obstructed with cerumen.   Cerumen was removed using gentle irrigation and curette. Tympanic membranes are intact following the procedure, although he has what appears to be wet soft cerumen against TM (at 3-5 o'clock).  Auditory canals is nonerythematous, but has small red dot/scratch.    Assessment:    Cerumen Impaction without otitis externa.    Plan:    1. Care instructions given. 2. Home treatment: flush w/warm water 2-3 times week in shower. 3. Consider ref to ENT if buzzing persistent. Pt will let us know. Follow-up as needed.

## 2013-06-04 NOTE — Patient Instructions (Signed)
Flush ears 3 times weekly with warm water & hydrogen peroxide.  I am not sure what is causing buzzing sound.  You may see ENT specialist if buzzing is persistent. Also, you may consider having hearing checked again if it has been a while-audiologist who fitted hearing aids can do this.

## 2013-06-04 NOTE — Progress Notes (Signed)
Pre-visit discussion using our clinic review tool. No additional management support is needed unless otherwise documented below in the visit note.  

## 2013-06-09 ENCOUNTER — Telehealth: Payer: Self-pay | Admitting: Internal Medicine

## 2013-06-09 MED ORDER — OXYCODONE-ACETAMINOPHEN 7.5-325 MG PO TABS: 1.0000 | ORAL_TABLET | Freq: Two times a day (BID) | ORAL | Status: DC | PRN

## 2013-06-09 NOTE — Telephone Encounter (Signed)
done

## 2013-06-09 NOTE — Telephone Encounter (Signed)
Lmovm. rx rdy for pick up at front desk.

## 2013-06-09 NOTE — Telephone Encounter (Signed)
4./6/15  Pt is requesing a refill on oxyCODONE-acetaminophen (PERCOCET) 7.5-325 MG

## 2013-06-09 NOTE — Telephone Encounter (Signed)
4.6.15  Pt is requesting a refill on rx

## 2013-06-09 NOTE — Telephone Encounter (Signed)
rx refill- percocet 7.5-325mg  Last OV- 01/29/13 Last refilled- 05/08/13 #60 / 0 rf  UDS- 03/10/13 LOW risk

## 2013-07-07 ENCOUNTER — Telehealth: Payer: Self-pay | Admitting: Internal Medicine

## 2013-07-07 MED ORDER — OXYCODONE-ACETAMINOPHEN 7.5-325 MG PO TABS: 1.0000 | ORAL_TABLET | Freq: Two times a day (BID) | ORAL | Status: DC | PRN

## 2013-07-07 NOTE — Telephone Encounter (Signed)
Pt notified. rx rdy for pick up at front desk.  

## 2013-07-07 NOTE — Telephone Encounter (Signed)
Oxycodone 7.5-325mg  Last OV_ 01/29/13 Last refilled- 06/09/13 #60 / 0 rf  UDS_ 03/10/13 Low risk

## 2013-07-07 NOTE — Telephone Encounter (Signed)
Caller name:Husein Gerety Relation to OO:[id number] Call back number: [phone] Pharmacy:  Reason for call:  To request a refill for Oxycodone

## 2013-07-07 NOTE — Telephone Encounter (Signed)
done

## 2013-08-05 ENCOUNTER — Encounter: Payer: Self-pay | Admitting: Internal Medicine

## 2013-08-05 ENCOUNTER — Ambulatory Visit (INDEPENDENT_AMBULATORY_CARE_PROVIDER_SITE_OTHER): Payer: Medicare HMO | Admitting: Internal Medicine

## 2013-08-05 VITALS — BP 111/52 | HR 60 | Temp 97.9°F | Wt 177.0 lb

## 2013-08-05 DIAGNOSIS — E119 Type 2 diabetes mellitus without complications: Secondary | ICD-10-CM

## 2013-08-05 DIAGNOSIS — I2510 Atherosclerotic heart disease of native coronary artery without angina pectoris: Secondary | ICD-10-CM

## 2013-08-05 DIAGNOSIS — N5089 Other specified disorders of the male genital organs: Secondary | ICD-10-CM

## 2013-08-05 DIAGNOSIS — G609 Hereditary and idiopathic neuropathy, unspecified: Secondary | ICD-10-CM

## 2013-08-05 DIAGNOSIS — N189 Chronic kidney disease, unspecified: Secondary | ICD-10-CM

## 2013-08-05 DIAGNOSIS — I10 Essential (primary) hypertension: Secondary | ICD-10-CM

## 2013-08-05 DIAGNOSIS — M109 Gout, unspecified: Secondary | ICD-10-CM

## 2013-08-05 LAB — COMPREHENSIVE METABOLIC PANEL
ALBUMIN: 3.6 g/dL (ref 3.5–5.2)
ALK PHOS: 63 U/L (ref 39–117)
ALT: 19 U/L (ref 0–53)
AST: 30 U/L (ref 0–37)
BUN: 27 mg/dL — ABNORMAL HIGH (ref 6–23)
CO2: 29 mEq/L (ref 19–32)
Calcium: 9.8 mg/dL (ref 8.4–10.5)
Chloride: 102 mEq/L (ref 96–112)
Creatinine, Ser: 1.3 mg/dL (ref 0.4–1.5)
GFR: 54.88 mL/min — ABNORMAL LOW (ref >60.00)
GLUCOSE: 130 mg/dL — AB (ref 70–99)
Potassium: 4.4 mEq/L (ref 3.5–5.1)
SODIUM: 138 meq/L (ref 135–145)
TOTAL PROTEIN: 6.9 g/dL (ref 6.0–8.3)
Total Bilirubin: 0.9 mg/dL (ref 0.2–1.2)

## 2013-08-05 LAB — HEMOGLOBIN A1C: HEMOGLOBIN A1C: 6.1 % (ref 4.6–6.5)

## 2013-08-05 MED ORDER — OXYCODONE-ACETAMINOPHEN 7.5-325 MG PO TABS: 1.0000 | ORAL_TABLET | Freq: Two times a day (BID) | ORAL | Status: DC | PRN

## 2013-08-05 NOTE — Progress Notes (Signed)
Subjective:    Patient ID: Derrick Fry, male    DOB: 26-Aug-1927, 80 y.o.   MRN: [id number]  DOS:  08/05/2013 Type of  Visit: ROV Since the last time he was here he is doing about the same, complain of fatigue but that is at baseline. Labs reviewed, due for a CMP and A1c. Also due for a DEXA. Needs a prescription for oxycodone.   ROS Denies fever, chills or weight loss. No headaches No chest pain or difficulty  Past Medical History  Diagnosis Date  . CAD (coronary artery disease)     MI 08-04-85  . Hyperlipidemia   . Peripheral neuropathy   . Hypertension   . Renal insufficiency     chronic w/ solitary kidney, congenital  . Depression   . Osteoarthritis   . Gout     diskitis 10/2008, Dr Ouida Sills  . Gait abnormality     chronic imbalance  . AAA (abdominal aortic aneurysm)   . Pneumonia     had right pneumonia pleurisy requiring resection of ribs and chest tube drainage at age 31  . Complication of anesthesia   . Small bowel obstruction 04/01/2012  . OSA (obstructive sleep apnea)     Limited CPAP tolerance  . Diabetes mellitus with neuropathy     Past Surgical History  Procedure Laterality Date  . Inguinal hernia repair  03/07/83  . Coronary artery bypass graft  01/04/86  . Total knee arthroplasty  05/04/89    left  . Cataract extraction  09/1999,04/2003    rt,left  . Knuckles replaced  05/2005    left hand  . 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 Abdominal aortic aneurysm repair    . Nephrectomy  1996  . US echocardiography  01/14/2007    EF 55-60%  . Cardiovascular stress test  10/13/2009    EF 57%    History   Social History  . Marital Status: Married    Spouse Name: N/A    Number of Children: 2  . Years of Education: N/A   Occupational History  . retired    Social History Main Topics  . Smoking status: Former Smoker    Quit date: 04/02/1972  . Smokeless tobacco: Never Used  . Alcohol Use: No     Comment:  former heavy alcohol use  . Drug Use: No  . Sexual Activity: Not on file   Other Topics Concern  . Not on file   Social History Narrative   Lost a son------   Lives at home with wife-----   Still drives-------        Medication List       This list is accurate as of: 08/05/13 11:59 PM.  Always use your most recent med list.               aspirin 81 MG tablet  Take 81 mg by mouth daily.     atenolol 25 MG tablet  Commonly known as:  TENORMIN  Take 12.5 mg by mouth daily.     atorvastatin 80 MG tablet  Commonly known as:  LIPITOR  Take 80 mg by mouth daily.     CENTRUM SILVER tablet  Take 1 tablet by mouth daily.     cyanocobalamin 100 MCG tablet  Take 100 mcg by mouth daily.     fish oil-omega-3 fatty acids 1000 MG capsule  Take 2 g by mouth daily.  Flaxseed Oil 1000 MG Caps  Take 1,000 mg by mouth 2 (two) times daily.     furosemide 40 MG tablet  Commonly known as:  LASIX  Take 40 mg by mouth daily.     gabapentin 400 MG capsule  Commonly known as:  NEURONTIN  Take 800 mg by mouth 2 (two) times daily.     glucosamine-chondroitin 500-400 MG tablet  Take 1 tablet by mouth 3 (three) times daily.     niacin 500 MG tablet  Take 500 mg by mouth at bedtime.     ONE TOUCH ULTRA TEST test strip  Generic drug:  glucose blood  USE ONE EVERY DAY     oxyCODONE-acetaminophen 7.5-325 MG per tablet  Commonly known as:  PERCOCET  Take 1 tablet by mouth 2 (two) times daily as needed for pain.     polyethylene glycol packet  Commonly known as:  MIRALAX / GLYCOLAX  Take 17 g by mouth as needed for mild constipation or moderate constipation.     predniSONE 5 MG tablet  Commonly known as:  DELTASONE  Take 2.5 mg by mouth daily.     ULORIC 40 MG tablet  Generic drug:  febuxostat  Take 40 mg by mouth daily.           Objective:   Physical Exam BP 111/52  Pulse 60  Temp(Src) 97.9 F (36.6 C)  Wt 177 lb (80.287 kg)  SpO2 98% General -- alert,  well-developed, NAD.  Lungs -- normal respiratory effort, no intercostal retractions, no accessory muscle use, dry crackles at bases Heart-- normal rate, regular rhythm, no murmur.   Extremities-- no pretibial edema bilaterally  Neurologic--  alert & oriented X3. Speech normal, gait appropriate for age, strength symmetric and appropriate for age.   Psych-- Cognition and judgment appear intact. Cooperative with normal attention span and concentration. No anxious or depressed appearing.        Assessment & Plan:

## 2013-08-05 NOTE — Progress Notes (Signed)
Pre visit review using our clinic review tool, if applicable. No additional management support is needed unless otherwise documented below in the visit note. 

## 2013-08-05 NOTE — Patient Instructions (Signed)
Get your blood work before you leave   Next visit is for a physical exam by 01-2014, fasting Please make an appointment    

## 2013-08-06 ENCOUNTER — Telehealth: Payer: Self-pay | Admitting: Internal Medicine

## 2013-08-06 NOTE — Assessment & Plan Note (Signed)
Pain management, UDS  03-2013 low risk, refill medications

## 2013-08-06 NOTE — Assessment & Plan Note (Signed)
Gout, onchronic steroids, will check a bone density test

## 2013-08-06 NOTE — Assessment & Plan Note (Signed)
Diabetes, due for A1c 

## 2013-08-06 NOTE — Telephone Encounter (Signed)
Relevant patient education mailed to patient.  

## 2013-08-06 NOTE — Assessment & Plan Note (Signed)
CAD, has an appointment pending to see cardiology this month

## 2013-08-07 ENCOUNTER — Encounter: Payer: Self-pay | Admitting: *Deleted

## 2013-08-07 NOTE — Progress Notes (Signed)
Letter sent.

## 2013-08-14 ENCOUNTER — Ambulatory Visit (INDEPENDENT_AMBULATORY_CARE_PROVIDER_SITE_OTHER)
Admission: RE | Admit: 2013-08-14 | Discharge: 2013-08-14 | Disposition: A | Discharge status: Home / Self Care | Payer: Medicare HMO | Source: Ambulatory Visit | Attending: Internal Medicine | Admitting: Internal Medicine

## 2013-08-14 DIAGNOSIS — N508 Other specified disorders of male genital organs: Secondary | ICD-10-CM

## 2013-08-14 DIAGNOSIS — IMO0002 Reserved for concepts with insufficient information to code with codable children: Secondary | ICD-10-CM

## 2013-08-14 DIAGNOSIS — N5089 Other specified disorders of the male genital organs: Secondary | ICD-10-CM

## 2013-08-18 ENCOUNTER — Encounter: Payer: Self-pay | Admitting: *Deleted

## 2013-08-27 ENCOUNTER — Encounter: Payer: Self-pay | Admitting: Cardiovascular Disease

## 2013-08-27 ENCOUNTER — Ambulatory Visit (INDEPENDENT_AMBULATORY_CARE_PROVIDER_SITE_OTHER): Payer: Medicare HMO | Admitting: Cardiovascular Disease

## 2013-08-27 VITALS — BP 140/70 | HR 56 | Ht 67.5 in | Wt 179.0 lb

## 2013-08-27 DIAGNOSIS — E785 Hyperlipidemia, unspecified: Secondary | ICD-10-CM

## 2013-08-27 DIAGNOSIS — I2510 Atherosclerotic heart disease of native coronary artery without angina pectoris: Secondary | ICD-10-CM

## 2013-08-27 NOTE — Assessment & Plan Note (Signed)
Derrick Fry is doing well.  We'll see her again in one year. We'll check an EKG today. Will check fasting lipids, liver enzymes, and basic metabolic profile was him again in one year.

## 2013-08-27 NOTE — Patient Instructions (Signed)
Your physician recommends that you continue on your current medications as directed. Please refer to the Current Medication list given to you today.  Your physician wants you to follow-up in: 1 year with Dr. Acie Fredrickson.  You will receive a reminder letter in the mail two months in advance. If you don't receive a letter, please call our office to schedule the follow-up appointment.  Your physician recommends that you return for lab work in: 12 months on the day of or a few days before your office visit with Dr. Acie Fredrickson.  You will need to FAST for this appointment - nothing to eat or drink after midnight the night before except water.

## 2013-08-27 NOTE — Progress Notes (Signed)
Derrick Fry Date of Birth  December 02, 1927 [facility] [address]    [place]   [place], [place]  [address]    [place], [place]  [address] [phone]  Fax  [phone]  [phone]  Fax [phone]  Problem List: 1. CAD - s/p CABG ( 1987, [place]) 2.  AAA - repair 3. Hypertension 4. Hyperlipidemia    History of Present Illness:  Derrick Fry is an 80 y.o. gentleman with the above noted hx.  He does not exercise much because of his arthritis.  He is active in the summertime.  August 29, 2012:  Derrick Fry is doing well.  He did not plant a garden this year. He stays active doing yard work.  Lipids are check at his medical doctors office.  August 27, 2013:  He takes his BP regularly,  It is usually well controlled.  No CP.  Able to do most of his normal activities.   A bit weaker - has some arthritis in his back.   Also has some hip pain.   Current Outpatient Prescriptions on File Prior to Visit  Medication Sig Dispense Refill  . aspirin 81 MG tablet Take 81 mg by mouth daily.       Marland Kitchen atenolol (TENORMIN) 25 MG tablet Take 12.5 mg by mouth daily.       Marland Kitchen atorvastatin (LIPITOR) 80 MG tablet Take 80 mg by mouth daily.      . cyanocobalamin 100 MCG tablet Take 100 mcg by mouth daily.       . febuxostat (ULORIC) 40 MG tablet Take 40 mg by mouth daily.       . fish oil-omega-3 fatty acids 1000 MG capsule Take 2 g by mouth daily.       . Flaxseed, Linseed, (FLAXSEED OIL) 1000 MG CAPS Take 1,000 mg by mouth 2 (two) times daily.       . furosemide (LASIX) 40 MG tablet Take 40 mg by mouth daily.       Marland Kitchen gabapentin (NEURONTIN) 400 MG capsule Take 800 mg by mouth 2 (two) times daily.       Marland Kitchen glucosamine-chondroitin 500-400 MG tablet Take 1 tablet by mouth 3 (three) times daily.       . Multiple Vitamins-Minerals (CENTRUM SILVER) tablet Take 1 tablet by mouth daily.       . niacin 500 MG tablet Take 500 mg by mouth at bedtime.       . ONE TOUCH ULTRA  TEST test strip USE ONE EVERY DAY  100 each  2  . oxyCODONE-acetaminophen (PERCOCET) 7.5-325 MG per tablet Take 1 tablet by mouth 2 (two) times daily as needed for pain.  60 tablet  0  . polyethylene glycol (MIRALAX / GLYCOLAX) packet Take 17 g by mouth as needed for mild constipation or moderate constipation.       . predniSONE (DELTASONE) 5 MG tablet Take 2.5 mg by mouth daily.        No current facility-administered medications on file prior to visit.    Allergies  Allergen Reactions  . Colchicine     Unknown     Past Medical History  Diagnosis Date  . CAD (coronary artery disease)     MI 08-04-85  . Hyperlipidemia   . Peripheral neuropathy   . Hypertension   . Renal insufficiency     chronic w/ solitary kidney, congenital  . Depression   . Osteoarthritis   .  Gout     diskitis 10/2008, Dr Ouida Sills  . Gait abnormality     chronic imbalance  . AAA (abdominal aortic aneurysm)   . Pneumonia     had right pneumonia pleurisy requiring resection of ribs and chest tube drainage at age 36  . Complication of anesthesia   . Small bowel obstruction 04/01/2012  . OSA (obstructive sleep apnea)     Limited CPAP tolerance  . Diabetes mellitus with neuropathy     Past Surgical History  Procedure Laterality Date  . Inguinal hernia repair  03/07/83  . Coronary artery bypass graft  01/04/86  . Total knee arthroplasty  05/04/89    left  . Cataract extraction  09/1999,04/2003    rt,left  . Knuckles replaced  05/2005    left hand  . 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 Abdominal aortic aneurysm repair    . Nephrectomy  1996  . US echocardiography  01/14/2007    EF 55-60%  . Cardiovascular stress test  10/13/2009    EF 57%    History  Smoking status  . Former Smoker  . Quit date: 04/02/1972  Smokeless tobacco  . Never Used    History  Alcohol Use No    Comment: former heavy alcohol use    Family History  Problem Relation Age of  Onset  . Lymphoma Sister   . Colon cancer Neg Hx   . Prostate cancer Neg Hx     Reviw of Systems:  Reviewed in the HPI.  All other systems are negative.  Physical Exam: Blood pressure 140/70, pulse 56, height 5' 7.5" (1.715 m), weight 179 lb (81.194 kg). General: Well developed, well nourished, in no acute distress.  Head: Normocephalic, atraumatic, sclera non-icteric, mucus membranes are moist,   Neck: Supple. Negative for carotid bruits. JVD not elevated.  Lungs: Clear bilaterally to auscultation without wheezes, rales, or rhonchi. Breathing is unlabored.  Heart: RRR with S1 S2.  There is a soft systolic murmur.    Abdomen: Soft, non-tender, non-distended with normoactive bowel sounds. No hepatomegaly. No rebound/guarding. No obvious abdominal masses.  Msk:  Strength and tone appear normal for age.  Extremities: No clubbing or cyanosis. No edema.  Distal pedal pulses are 2+ and equal bilaterally.  Neuro: Alert and oriented X 3. Moves all extremities spontaneously.  Psych:  Responds to questions appropriately with a normal affect.  ECG: August 27, 2013:  Sinus brady with 1st deree AV block.  LAD, inc. LBBB  Assessment / Plan:

## 2013-09-01 ENCOUNTER — Telehealth: Payer: Self-pay | Admitting: *Deleted

## 2013-09-01 MED ORDER — OXYCODONE-ACETAMINOPHEN 7.5-325 MG PO TABS: 1.0000 | ORAL_TABLET | Freq: Two times a day (BID) | ORAL | Status: DC | PRN

## 2013-09-01 NOTE — Telephone Encounter (Signed)
2 Rx provided

## 2013-09-01 NOTE — Telephone Encounter (Signed)
UDS 03/10/13 low risk.

## 2013-09-01 NOTE — Telephone Encounter (Signed)
Caller name:  Tage Relation to pt:  self Call back number:  [phone]   [facility]  Reason for call:   Pt called to request refill on   oxyCODONE-acetaminophen (PERCOCET) 7.5-325 MG per tablet  Last filled 08/05/2013, #60, no refills Last OV 08/05/2013  Pt has 3 left, going out of town tomorrow, could pick up tomorrow morning.  Please advise.  bw

## 2013-09-02 NOTE — Telephone Encounter (Signed)
Lmovm. rdy for pick up at our front desk.

## 2013-11-03 ENCOUNTER — Telehealth: Payer: Self-pay | Admitting: Internal Medicine

## 2013-11-03 MED ORDER — OXYCODONE-ACETAMINOPHEN 7.5-325 MG PO TABS: 1.0000 | ORAL_TABLET | Freq: Two times a day (BID) | ORAL | Status: DC | PRN

## 2013-11-03 NOTE — Telephone Encounter (Signed)
Pt is requesting Oxycodone refill.   Last OV: 08/05/2013 Last Fill: 09/01/2013 # 60 with 0 RF Last UDS: 08/05/2013  Please Advise.

## 2013-11-03 NOTE — Telephone Encounter (Signed)
Spoke with Pts wife and let her know his medication is ready for pick up at front desk.

## 2013-11-03 NOTE — Telephone Encounter (Signed)
Done x2

## 2013-11-03 NOTE — Telephone Encounter (Signed)
Patient is requesting a new oxycodone rx

## 2013-12-30 ENCOUNTER — Telehealth: Payer: Self-pay | Admitting: Internal Medicine

## 2013-12-30 MED ORDER — OXYCODONE-ACETAMINOPHEN 7.5-325 MG PO TABS: 1.0000 | ORAL_TABLET | Freq: Two times a day (BID) | ORAL | Status: DC | PRN

## 2013-12-30 NOTE — Telephone Encounter (Signed)
Informed Pt that medication is ready for pick up at front desk.

## 2013-12-30 NOTE — Telephone Encounter (Signed)
Done. Also advise pt  needs office visit before next refill

## 2013-12-30 NOTE — Telephone Encounter (Signed)
Pt is requesting refill on Oxycodone  Last OV: 08/05/2013 Last Fill: 11/03/2013 # 60 0 RF UDS: 08/05/2013 Low risk   Please advise.

## 2013-12-30 NOTE — Telephone Encounter (Signed)
Caller name: Breccan Relation to pt: Call back [phone]   Reason for call:  Pt would like a refill on Rx oxyCODONE-acetaminophen (PERCOCET) 7.5-325 MG per

## 2014-01-12 LAB — HM DIABETES EYE EXAM

## 2014-01-19 ENCOUNTER — Telehealth: Payer: Self-pay | Admitting: *Deleted

## 2014-01-19 MED ORDER — OXYCODONE-ACETAMINOPHEN 7.5-325 MG PO TABS
1.0000 | ORAL_TABLET | Freq: Two times a day (BID) | ORAL | Status: DC | PRN
Start: 2014-01-19 — End: 2014-02-24

## 2014-01-19 NOTE — Telephone Encounter (Signed)
Spoke with Pt and informed him rx is ready for pick up at front desk.

## 2014-01-19 NOTE — Telephone Encounter (Signed)
If he has an appointment will be ok to RF

## 2014-01-19 NOTE — Telephone Encounter (Signed)
Please advise 

## 2014-01-19 NOTE — Telephone Encounter (Signed)
Rx printed

## 2014-01-19 NOTE — Telephone Encounter (Signed)
Caller name: Billye Relation to pt:  self Call back number:  [phone] Pharmacy:  Reason for call: Pt has appt for CPE 02/17/2014.  His prescription for oxycodone will be due for refill aprox 01/30/2014.  Notes on last refill state he needs OV before next refill.  Since he is coming 02/17/2014, can we do his refill for 01/30/2014 when due? Or does he need to come before refill is due?  Please advise.

## 2014-01-30 ENCOUNTER — Encounter: Payer: Medicare HMO | Admitting: Internal Medicine

## 2014-02-17 ENCOUNTER — Ambulatory Visit (INDEPENDENT_AMBULATORY_CARE_PROVIDER_SITE_OTHER): Payer: Medicare HMO | Admitting: Internal Medicine

## 2014-02-17 ENCOUNTER — Encounter: Payer: Self-pay | Admitting: Internal Medicine

## 2014-02-17 VITALS — BP 152/74 | HR 53 | Temp 97.7°F | Ht 68.0 in | Wt 176.2 lb

## 2014-02-17 DIAGNOSIS — Z23 Encounter for immunization: Secondary | ICD-10-CM

## 2014-02-17 DIAGNOSIS — G609 Hereditary and idiopathic neuropathy, unspecified: Secondary | ICD-10-CM

## 2014-02-17 DIAGNOSIS — E119 Type 2 diabetes mellitus without complications: Secondary | ICD-10-CM

## 2014-02-17 DIAGNOSIS — M150 Primary generalized (osteo)arthritis: Secondary | ICD-10-CM

## 2014-02-17 DIAGNOSIS — I10 Essential (primary) hypertension: Secondary | ICD-10-CM

## 2014-02-17 DIAGNOSIS — Z0000 Encounter for general adult medical examination without abnormal findings: Secondary | ICD-10-CM

## 2014-02-17 DIAGNOSIS — M159 Polyosteoarthritis, unspecified: Secondary | ICD-10-CM

## 2014-02-17 DIAGNOSIS — M1A49X0 Other secondary chronic gout, multiple sites, without tophus (tophi): Secondary | ICD-10-CM

## 2014-02-17 NOTE — Progress Notes (Signed)
Pre visit review using our clinic review tool, if applicable. No additional management support is needed unless otherwise documented below in the visit note. 

## 2014-02-17 NOTE — Progress Notes (Signed)
Subjective:    Patient ID: Derrick Fry, male    DOB: Dec 01, 1927, 80 y.o.   MRN: [id number]  DOS:  02/17/2014 Type of visit - description :   Here for Medicare AWV:   1. Risk factors based on Past M, S, F history: reviewed   2. Physical Activities: active during the summer, not much in the winter --> counseled ? [employer]   3. Depression/mood: Neg screening    4. Hearing: Corrected w/ hearing aids which uses inconsistently   5. ADL's: Independent, still drives   6. Fall Risk: no recent falls, prevention discussed   7. home Safety: does feel safe at home   8. Height, weight, &visual acuity: see VS, vision stable, sees eye doctor regulalrly   9. Counseling: provided   10. Labs ordered based on risk factors: if needed   11. Referral Coordination: if needed   12. Care Plan, see assessment and plan   13. Cognitive Assessment: motor skill and cognition appropriate for age.  14. Care team updated 15. Written plan provided  In addition, today we discussed the following:  CAD, saw cardiology few months ago felt to be stable. Neuropathy, on gabapentin, symptoms well-controlled. High cholesterol, good medication compliance  Gout-, still sees rheumatology, on chronic prednisone and uloric, no recent attacks Diabetes, due for an A1c DJD, occasional back pain when he does yard  work, usually he increases the prednisone dose temporarily with good results   ROS Denies chest pain or difficulty breathing No nausea, vomiting, diarrhea No cough, sputum production or wheezing No dysuria, gross hematuria difficulty urinating  Past Medical History  Diagnosis Date  . CAD (coronary artery disease)     MI 08-04-85  . Hyperlipidemia   . Peripheral neuropathy   . Hypertension   . Renal insufficiency     chronic w/ solitary kidney, congenital  . Depression   . Osteoarthritis   . Gout     diskitis 10/2008, Dr Ouida Sills  . Gait abnormality     chronic imbalance  . AAA (abdominal aortic aneurysm)   .  Pneumonia     had right pneumonia pleurisy requiring resection of ribs and chest tube drainage at age 48  . Complication of anesthesia   . Small bowel obstruction 04/01/2012  . OSA (obstructive sleep apnea)     Limited CPAP tolerance  . Diabetes mellitus with neuropathy     Past Surgical History  Procedure Laterality Date  . Inguinal hernia repair  03/07/83  . Coronary artery bypass graft  01/04/86  . Total knee arthroplasty  05/04/89    left  . Cataract extraction  09/1999,04/2003    rt,left  . Knuckles replaced  05/2005    left hand  . 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 Abdominal aortic aneurysm repair    . Nephrectomy  1996  . US echocardiography  01/14/2007    EF 55-60%  . Cardiovascular stress test  10/13/2009    EF 57%   Family History  Problem Relation Age of Onset  . Lymphoma Sister   . Colon cancer Neg Hx   . Prostate cancer Neg Hx     History   Social History  . Marital Status: Married    Spouse Name: N/A    Number of Children: 2  . Years of Education: N/A   Occupational History  . retired    Social History Main Topics  . Smoking status:  Former Smoker    Quit date: 04/02/1972  . Smokeless tobacco: Never Used  . Alcohol Use: No     Comment: former heavy alcohol use  . Drug Use: No  . Sexual Activity: Not on file   Other Topics Concern  . Not on file   Social History Narrative   Lost a son    Lives at home with wife    Still drives         Medication List       This list is accurate as of: 02/17/14 11:59 PM.  Always use your most recent med list.               aspirin 81 MG tablet  Take 81 mg by mouth daily.     atenolol 25 MG tablet  Commonly known as:  TENORMIN  Take 12.5 mg by mouth daily.     atorvastatin 80 MG tablet  Commonly known as:  LIPITOR  Take 80 mg by mouth daily.     CENTRUM SILVER tablet  Take 1 tablet by mouth daily.     cyanocobalamin 100 MCG tablet  Take 100  mcg by mouth daily.     fish oil-omega-3 fatty acids 1000 MG capsule  Take 2 g by mouth daily.     Flaxseed Oil 1000 MG Caps  Take 1,000 mg by mouth 2 (two) times daily.     furosemide 40 MG tablet  Commonly known as:  LASIX  Take 40 mg by mouth daily.     gabapentin 400 MG capsule  Commonly known as:  NEURONTIN  Take 800 mg by mouth 2 (two) times daily.     glucosamine-chondroitin 500-400 MG tablet  Take 1 tablet by mouth 3 (three) times daily.     niacin 500 MG tablet  Take 500 mg by mouth at bedtime.     ONE TOUCH ULTRA TEST test strip  Generic drug:  glucose blood  USE ONE EVERY DAY     oxyCODONE-acetaminophen 7.5-325 MG per tablet  Commonly known as:  PERCOCET  Take 1 tablet by mouth 2 (two) times daily as needed for pain.     polyethylene glycol packet  Commonly known as:  MIRALAX / GLYCOLAX  Take 17 g by mouth as needed for mild constipation or moderate constipation.     predniSONE 5 MG tablet  Commonly known as:  DELTASONE  Take 2.5 mg by mouth daily.     ULORIC 40 MG tablet  Generic drug:  febuxostat  Take 40 mg by mouth daily.           Objective:   Physical Exam  Abdominal:     BP 152/74 mmHg  Pulse 53  Temp(Src) 97.7 F (36.5 C) (Oral)  Ht 5\' 8"  (1.727 m)  Wt 176 lb 4 oz (79.946 kg)  BMI 26.80 kg/m2  SpO2 97% General -- alert, well-developed, NAD.  Neck --no thyromegaly , normal carotid pulse  HEENT-- Not pale.   Lungs -- normal respiratory effort, no intercostal retractions, no accessory muscle use, and normal breath sounds.  Heart-- normal rate, regular rhythm, no murmur.  Abdomen-- Not distended, good bowel sounds,soft, non-tender. Extremities-- trace  pretibial edema bilaterally  Neurologic--  alert & oriented X3. Speech normal, gait appropriate for age, strength symmetric and appropriate for age.   psych-- Cognition and judgment appear intact. Cooperative with normal attention span and concentration. No anxious or depressed  appearing.        Assessment &  Plan:  Hypertension, continue with atenolol, Lasix, check a BMP CAD, asymptomatic. Recently saw cardiology 08/2013, plan is to control cardiovascular risk factors Diabetes, On diet control, check A1c

## 2014-02-17 NOTE — Patient Instructions (Signed)
Get your blood work before you leave    Please come back to the office in 6 months  for a routine check up , fasting         Fall Prevention and Home Safety Falls cause injuries and can affect all age groups. It is possible to use preventive measures to significantly decrease the likelihood of falls. There are many simple measures which can make your home safer and prevent falls. OUTDOORS  Repair cracks and edges of walkways and driveways.  Remove high doorway thresholds.  Trim shrubbery on the main path into your home.  Have good outside lighting.  Clear walkways of tools, rocks, debris, and clutter.  Check that handrails are not broken and are securely fastened. Both sides of steps should have handrails.  Have leaves, snow, and ice cleared regularly.  Use sand or salt on walkways during winter months.  In the garage, clean up grease or oil spills. BATHROOM  Install night lights.  Install grab bars by the toilet and in the tub and shower.  Use non-skid mats or decals in the tub or shower.  Place a plastic non-slip stool in the shower to sit on, if needed.  Keep floors dry and clean up all water on the floor immediately.  Remove soap buildup in the tub or shower on a regular basis.  Secure bath mats with non-slip, double-sided rug tape.  Remove throw rugs and tripping hazards from the floors. BEDROOMS  Install night lights.  Make sure a bedside light is easy to reach.  Do not use oversized bedding.  Keep a telephone by your bedside.  Have a firm chair with side arms to use for getting dressed.  Remove throw rugs and tripping hazards from the floor. KITCHEN  Keep handles on pots and pans turned toward the center of the stove. Use back burners when possible.  Clean up spills quickly and allow time for drying.  Avoid walking on wet floors.  Avoid hot utensils and knives.  Position shelves so they are not too high or low.  Place commonly used objects  within easy reach.  If necessary, use a sturdy step stool with a grab bar when reaching.  Keep electrical cables out of the way.  Do not use floor polish or wax that makes floors slippery. If you must use wax, use non-skid floor wax.  Remove throw rugs and tripping hazards from the floor. STAIRWAYS  Never leave objects on stairs.  Place handrails on both sides of stairways and use them. Fix any loose handrails. Make sure handrails on both sides of the stairways are as long as the stairs.  Check carpeting to make sure it is firmly attached along stairs. Make repairs to worn or loose carpet promptly.  Avoid placing throw rugs at the top or bottom of stairways, or properly secure the rug with carpet tape to prevent slippage. Get rid of throw rugs, if possible.  Have an electrician put in a light switch at the top and bottom of the stairs. OTHER FALL PREVENTION TIPS  Wear low-heel or rubber-soled shoes that are supportive and fit well. Wear closed toe shoes.  When using a stepladder, make sure it is fully opened and both spreaders are firmly locked. Do not climb a closed stepladder.  Add color or contrast paint or tape to grab bars and handrails in your home. Place contrasting color strips on first and last steps.  Learn and use mobility aids as needed. Install an Dealer emergency  response system.  Turn on lights to avoid dark areas. Replace light bulbs that burn out immediately. Get light switches that glow.  Arrange furniture to create clear pathways. Keep furniture in the same place.  Firmly attach carpet with non-skid or double-sided tape.  Eliminate uneven floor surfaces.  Select a carpet pattern that does not visually hide the edge of steps.  Be aware of all pets. OTHER HOME SAFETY TIPS  Set the water temperature for 120 F (48.8 C).  Keep emergency numbers on or near the telephone.  Keep smoke detectors on every level of the home and near sleeping  areas. Document Released: 02/10/2002 Document Revised: 08/22/2011 Document Reviewed: 05/12/2011 [facility] Patient Information 2015 [place], [place]. This information is not intended to replace advice given to you by your health care provider. Make sure you discuss any questions you have with your health care provider.    Preventive Care for Adults    Ages 71 and over  Blood pressure check.** / Every 1 to 2 years.  Lipid and cholesterol check.**/ Every 5 years beginning at age 32.  Lung cancer screening. / Every year if you are aged 38-80 years and have a 30-pack-year history of smoking and currently smoke or have quit within the past 15 years. Yearly screening is stopped once you have quit smoking for at least 15 years or develop a health problem that would prevent you from having lung cancer treatment.  Fecal occult blood test (FOBT) of stool. / Every year beginning at age 74 and continuing until age 51. You may not have to do this test if you get a colonoscopy every 10 years.  Flexible sigmoidoscopy** or colonoscopy.** / Every 5 years for a flexible sigmoidoscopy or every 10 years for a colonoscopy beginning at age 44 and continuing until age 66.  Hepatitis C blood test.** / For all people born from [birth year] through 1963 and any individual with known risks for hepatitis C.  Abdominal aortic aneurysm (AAA) screening.** / A one-time screening for ages 61 to 41 years who are current or former smokers.  Skin self-exam. / Monthly.  Influenza vaccine. / Every year.  Tetanus, diphtheria, and acellular pertussis (Tdap/Td) vaccine.** / 1 dose of Td every 10 years.  Varicella vaccine.** / Consult your health care provider.  Zoster vaccine.** / 1 dose for adults aged 76 years or older.  Pneumococcal 13-valent conjugate (PCV13) vaccine.** / Consult your health care provider.  Pneumococcal polysaccharide (PPSV23) vaccine.** / 1 dose for all adults aged 57 years and older.  Meningococcal  vaccine.** / Consult your health care provider.  Hepatitis A vaccine.** / Consult your health care provider.  Hepatitis B vaccine.** / Consult your health care provider.  Haemophilus influenzae type b (Hib) vaccine.** / Consult your health care provider. **Family history and personal history of risk and conditions may change your health care provider's recommendations. Document Released: 04/18/2001 Document Revised: 02/25/2013 Document Reviewed: 07/18/2010 [facility] Patient Information 2015 [place], [place]. This information is not intended to replace advice given to you by your health care provider. Make sure you discuss any questions you have with your health care provider.

## 2014-02-17 NOTE — Assessment & Plan Note (Signed)
Td 2010  pneumonia shot 2007  prevnar-- today Shingles shot--- @ the [facility] per pt  Had a flu shot   colonoscopy in 2005, report reviewed, next colonoscopy 2010. Dr. Amedeo Plenty; patient reports he got a call and was told that cscope was optional. Decided not to go for another one  No further prostate ca screening, see entry from 2014 Doing very well, diet, exercise and fall prevention discussed.

## 2014-02-18 LAB — CBC WITH DIFFERENTIAL/PLATELET
BASOS ABS: 0 10*3/uL (ref 0.0–0.1)
BASOS PCT: 0.4 % (ref 0.0–3.0)
EOS ABS: 0.1 10*3/uL (ref 0.0–0.7)
Eosinophils Relative: 0.8 % (ref 0.0–5.0)
HCT: 40.4 % (ref 39.0–52.0)
Hemoglobin: 13.3 g/dL (ref 13.0–17.0)
LYMPHS PCT: 18.2 % (ref 12.0–46.0)
Lymphs Abs: 1.7 10*3/uL (ref 0.7–4.0)
MCHC: 32.9 g/dL (ref 30.0–36.0)
MCV: 99.1 fl (ref 78.0–100.0)
Monocytes Absolute: 0.4 10*3/uL (ref 0.1–1.0)
Monocytes Relative: 4.1 % (ref 3.0–12.0)
Neutro Abs: 7.1 10*3/uL (ref 1.4–7.7)
Neutrophils Relative %: 76.5 % (ref 43.0–77.0)
PLATELETS: 180 10*3/uL (ref 150.0–400.0)
RBC: 4.07 Mil/uL — AB (ref 4.22–5.81)
RDW: 13.3 % (ref 11.5–15.5)
WBC: 9.3 10*3/uL (ref 4.0–10.5)

## 2014-02-18 LAB — HEMOGLOBIN A1C: Hgb A1c MFr Bld: 6.3 % (ref 4.6–6.5)

## 2014-02-18 LAB — BASIC METABOLIC PANEL
BUN: 37 mg/dL — AB (ref 6–23)
CO2: 26 mEq/L (ref 19–32)
Calcium: 10.1 mg/dL (ref 8.4–10.5)
Chloride: 100 mEq/L (ref 96–112)
Creatinine, Ser: 1.4 mg/dL (ref 0.4–1.5)
GFR: 49.57 mL/min — ABNORMAL LOW (ref >60.00)
Glucose, Bld: 105 mg/dL — ABNORMAL HIGH (ref 70–99)
POTASSIUM: 5 meq/L (ref 3.5–5.1)
SODIUM: 134 meq/L — AB (ref 135–145)

## 2014-02-18 LAB — TSH: TSH: 1.47 u[IU]/mL (ref 0.35–4.50)

## 2014-02-18 NOTE — Assessment & Plan Note (Signed)
Peripheral neuropathy, well-controlled on gabapentin, also on pain medication, UDS 03-2013 low risk

## 2014-02-18 NOTE — Assessment & Plan Note (Signed)
Gout, on chronic prednisone, no recent exacerbations, still sees rheumatology. Two  density test previously were negative.

## 2014-02-18 NOTE — Assessment & Plan Note (Signed)
DJD, occasionally uses prednisone for an exacerbation which is appropriate.

## 2014-02-24 ENCOUNTER — Telehealth: Payer: Self-pay | Admitting: Internal Medicine

## 2014-02-24 MED ORDER — OXYCODONE-ACETAMINOPHEN 7.5-325 MG PO TABS: 1.0000 | ORAL_TABLET | Freq: Two times a day (BID) | ORAL | Status: DC | PRN

## 2014-02-24 NOTE — Telephone Encounter (Signed)
Tell pt we are printing 2 prescriptions

## 2014-02-24 NOTE — Telephone Encounter (Signed)
Request refill on oxycodone 

## 2014-02-24 NOTE — Telephone Encounter (Signed)
Pt is requesting refill on Oxycodone.  Last OV: 02/17/2014 Last Fill: 01/19/2014 # 52 0RF UDS: 08/05/2013 Low risk  Please advise.

## 2014-02-24 NOTE — Telephone Encounter (Signed)
Spoke with Pt, informed him rx is ready for pick up at front desk.  

## 2014-04-24 ENCOUNTER — Other Ambulatory Visit: Payer: Self-pay

## 2014-04-24 ENCOUNTER — Telehealth: Payer: Self-pay | Admitting: Internal Medicine

## 2014-04-24 MED ORDER — FUROSEMIDE 40 MG PO TABS: 40.0000 mg | ORAL_TABLET | Freq: Every day | ORAL | Status: DC

## 2014-04-24 MED ORDER — ATORVASTATIN CALCIUM 80 MG PO TABS: 80.0000 mg | ORAL_TABLET | Freq: Every day | ORAL | Status: DC

## 2014-04-24 MED ORDER — ATENOLOL 25 MG PO TABS: 12.5000 mg | ORAL_TABLET | Freq: Every day | ORAL | Status: DC

## 2014-04-24 MED ORDER — GABAPENTIN 400 MG PO CAPS: 800.0000 mg | ORAL_CAPSULE | Freq: Two times a day (BID) | ORAL | Status: DC

## 2014-04-24 MED ORDER — OXYCODONE-ACETAMINOPHEN 7.5-325 MG PO TABS: 1.0000 | ORAL_TABLET | Freq: Two times a day (BID) | ORAL | Status: DC | PRN

## 2014-04-24 NOTE — Telephone Encounter (Signed)
Caller name: Makai Relation to pt: self Call back number: [phone] [facility]  Reason for call:   Requesting refills of oxycodone, furosemide 40mg , atenolol 25mg , atorvastatin 80mg , gabapentin 400mg 

## 2014-04-24 NOTE — Telephone Encounter (Signed)
Pt is requesting refill on Oxycodone.  Last OV: 02/17/2014 Last Fill: 02/24/2014 # 74 0RF UDS: 08/05/2013 Low risk   Please advise.     Atorvastatin, Atenolol, Furosemide, and Gabapentin refilled to [facility] as requested.

## 2014-04-24 NOTE — Telephone Encounter (Signed)
Spoke with Pt, informed him that Atorvastatin, Atenolol, and Gabapentin was sent to pharmacy and Oxycodone Rx is ready for pick up at front desk.

## 2014-04-24 NOTE — Telephone Encounter (Signed)
Print #60, no RF

## 2014-04-24 NOTE — Telephone Encounter (Signed)
Printed, awaiting signature by Dr. Larose Kells.

## 2014-05-19 ENCOUNTER — Ambulatory Visit (INDEPENDENT_AMBULATORY_CARE_PROVIDER_SITE_OTHER): Payer: PPO | Admitting: Internal Medicine

## 2014-05-19 ENCOUNTER — Encounter: Payer: Self-pay | Admitting: Internal Medicine

## 2014-05-19 VITALS — BP 134/78 | HR 46 | Temp 98.2°F | Ht 68.0 in | Wt 182.4 lb

## 2014-05-19 DIAGNOSIS — T148 Other injury of unspecified body region: Secondary | ICD-10-CM

## 2014-05-19 DIAGNOSIS — I10 Essential (primary) hypertension: Secondary | ICD-10-CM

## 2014-05-19 DIAGNOSIS — T148XXA Other injury of unspecified body region, initial encounter: Secondary | ICD-10-CM

## 2014-05-19 NOTE — Assessment & Plan Note (Addendum)
On atenolol 25 mg half tablet daily, pulse in the 40s, at home is usually in the 50s or mid 40s. Plan: Discontinue atenolol, watch BP and pulse.

## 2014-05-19 NOTE — Progress Notes (Signed)
Subjective:    Patient ID: Derrick Fry, male    DOB: [birth year], 81 y.o.   MRN: [id number]  DOS:  05/19/2014 Type of visit - description : acute Interval history: Had a fall yesterday, he was pulling a t-shirt over his head, lost balance, fell forward, landed on his arms. He hit his face as well, at the left cheek, saw a small amount of blood. Today he had no further bleeding, he's hurting mildly at the left wrist and left anterior chest. Denies any syncope, palpitations, chest pain. His pulse is actually in the low side. No  orthostatic symptoms. He takes pain medication but denies feeling excessively sleepy.   Review of Systems Denies nausea or vomiting Currently without neck pain, headache, shoulder pain.  Past Medical History  Diagnosis Date  . CAD (coronary artery disease)     MI 08-04-85  . Hyperlipidemia   . Peripheral neuropathy   . Hypertension   . Renal insufficiency     chronic w/ solitary kidney, congenital  . Depression   . Osteoarthritis   . Gout     diskitis 10/2008, Dr Ouida Sills  . Gait abnormality     chronic imbalance  . AAA (abdominal aortic aneurysm)   . Pneumonia     had right pneumonia pleurisy requiring resection of ribs and chest tube drainage at age 39  . Complication of anesthesia   . Small bowel obstruction 04/01/2012  . OSA (obstructive sleep apnea)     Limited CPAP tolerance  . Diabetes mellitus with neuropathy     Past Surgical History  Procedure Laterality Date  . Inguinal hernia repair  03/07/83  . Coronary artery bypass graft  01/04/86  . Total knee arthroplasty  05/04/89    left  . Cataract extraction  09/1999,04/2003    rt,left  . Knuckles replaced  05/2005    left hand  . 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 Abdominal aortic aneurysm repair    . Nephrectomy  1996  . US echocardiography  01/14/2007    EF 55-60%  . Cardiovascular stress test  10/13/2009    EF 57%    History   Social  History  . Marital Status: Married    Spouse Name: N/A  . Number of Children: 2  . Years of Education: N/A   Occupational History  . retired    Social History Main Topics  . Smoking status: Former Smoker    Quit date: 04/02/1972  . Smokeless tobacco: Never Used  . Alcohol Use: No     Comment: former heavy alcohol use  . Drug Use: No  . Sexual Activity: Not on file   Other Topics Concern  . Not on file   Social History Narrative   Lost a son    Lives at home with wife    Still drives         Medication List       This list is accurate as of: 05/19/14  8:55 PM.  Always use your most recent med list.               aspirin 81 MG tablet  Take 81 mg by mouth daily.     atorvastatin 80 MG tablet  Commonly known as:  LIPITOR  Take 1 tablet (80 mg total) by mouth daily.     CENTRUM SILVER tablet  Take 1 tablet by mouth daily.  cyanocobalamin 100 MCG tablet  Take 100 mcg by mouth daily.     fish oil-omega-3 fatty acids 1000 MG capsule  Take 2 g by mouth daily.     Flaxseed Oil 1000 MG Caps  Take 1,000 mg by mouth 2 (two) times daily.     furosemide 40 MG tablet  Commonly known as:  LASIX  Take 1 tablet (40 mg total) by mouth daily.     gabapentin 400 MG capsule  Commonly known as:  NEURONTIN  Take 2 capsules (800 mg total) by mouth 2 (two) times daily.     glucosamine-chondroitin 500-400 MG tablet  Take 1 tablet by mouth 3 (three) times daily.     niacin 500 MG tablet  Take 500 mg by mouth at bedtime.     ONE TOUCH ULTRA TEST test strip  Generic drug:  glucose blood  USE ONE EVERY DAY     oxyCODONE-acetaminophen 7.5-325 MG per tablet  Commonly known as:  PERCOCET  Take 1 tablet by mouth 2 (two) times daily as needed for pain.     polyethylene glycol packet  Commonly known as:  MIRALAX / GLYCOLAX  Take 17 g by mouth as needed for mild constipation or moderate constipation.     predniSONE 5 MG tablet  Commonly known as:  DELTASONE  Take 2.5  mg by mouth daily.     ULORIC 40 MG tablet  Generic drug:  febuxostat  Take 40 mg by mouth daily.           Objective:   Physical Exam  HENT:  Head:     BP 134/78 mmHg  Pulse 46  Temp(Src) 98.2 F (36.8 C) (Oral)  Ht 5\' 8"  (1.727 m)  Wt 182 lb 6 oz (82.725 kg)  BMI 27.74 kg/m2  SpO2 95% General:   Well developed, well nourished . NAD.  HEENT:  Normocephalic . Face symmetric, palpation of the orbits without tenderness or crepitus. Lungs:  Dry crackles bilaterally, not a new finding. Normal respiratory effort, no intercostal retractions, no accessory muscle use. Reports mildly TTP at the left anterior chest wall Heart: RRR,  no murmur.  Muscle skeletal: no pretibial edema bilaterally  Wrists symmetric, range of motion normal Skin: Not pale. Not jaundice Neurologic:  alert & oriented X3.  Speech normal, gait appropriate for age and  Assisted by a cane Psych--  Cognition and judgment appear intact.  Cooperative with normal attention span and concentration.  Behavior appropriate. No anxious or depressed appearing.        Assessment & Plan:   Status post fall. Has a facial, wrist, chest wall contusion. Doubt a rib fracture. We discussed to continue using consistently cane and consider a walker. On clinical grounds, I don't suspect syncope. He is bradycardic and we are adjusting his medication although I doubt bradycardia was the culprit of the lost her balance

## 2014-05-19 NOTE — Patient Instructions (Signed)
Tylenol as needed for pain  Stop atenolol  Check the  blood pressure and pulse 3-4  times a   Week   Be sure your blood pressure is between 110/65 and  145/85.  if it is consistently higher or lower, let me know Your pulse should be between 50 and 80   Next visit by June 2016

## 2014-05-19 NOTE — Progress Notes (Signed)
Pre visit review using our clinic review tool, if applicable. No additional management support is needed unless otherwise documented below in the visit note. 

## 2014-05-22 ENCOUNTER — Telehealth: Payer: Self-pay | Admitting: Internal Medicine

## 2014-05-22 MED ORDER — OXYCODONE-ACETAMINOPHEN 7.5-325 MG PO TABS: 1.0000 | ORAL_TABLET | Freq: Two times a day (BID) | ORAL | Status: DC | PRN

## 2014-05-22 MED ORDER — OXYCODONE-ACETAMINOPHEN 7.5-325 MG PO TABS
1.0000 | ORAL_TABLET | Freq: Two times a day (BID) | ORAL | Status: DC | PRN
Start: 2014-05-22 — End: 2014-07-27

## 2014-05-22 NOTE — Telephone Encounter (Signed)
Okay to refill, Print  2 prescriptions, needs a UDS

## 2014-05-22 NOTE — Telephone Encounter (Signed)
Spoke with Wells Guiles, Pt's wife, informed her Rx is ready for pick up at front desk.

## 2014-05-22 NOTE — Telephone Encounter (Signed)
Rx printed, awaiting signature by Dr. Paz.  

## 2014-05-22 NOTE — Telephone Encounter (Signed)
Caller name:Pacer, Oluwasemilore Relation to HX:TAVW Call back number:[phone] Pharmacy:  Reason for call: pt is needing rx for oxyCODONE-acetaminophen (PERCOCET) 7.5-325 MG per tablet  Please call when available for pick up ok to leave message.

## 2014-05-22 NOTE — Telephone Encounter (Signed)
Pt is requesting refill on Oxycodone.  Last OV: 05/19/2014 Last Fill: 04/24/2014 #60 0RF UDS: 03/20/2013 Low risk. PT DUE FOR UDS  Please advise.

## 2014-05-25 ENCOUNTER — Encounter: Payer: Self-pay | Admitting: Physician Assistant

## 2014-05-25 ENCOUNTER — Ambulatory Visit (INDEPENDENT_AMBULATORY_CARE_PROVIDER_SITE_OTHER): Payer: PPO | Admitting: Physician Assistant

## 2014-05-25 ENCOUNTER — Ambulatory Visit (HOSPITAL_BASED_OUTPATIENT_CLINIC_OR_DEPARTMENT_OTHER)
Admission: RE | Admit: 2014-05-25 | Discharge: 2014-05-25 | Disposition: A | Discharge status: Home / Self Care | Payer: PPO | Source: Ambulatory Visit | Attending: Physician Assistant | Admitting: Physician Assistant

## 2014-05-25 VITALS — BP 96/53 | HR 76 | Temp 99.7°F | Resp 16 | Ht 68.0 in | Wt 179.1 lb

## 2014-05-25 DIAGNOSIS — R509 Fever, unspecified: Secondary | ICD-10-CM | POA: Diagnosis not present

## 2014-05-25 DIAGNOSIS — J208 Acute bronchitis due to other specified organisms: Secondary | ICD-10-CM

## 2014-05-25 DIAGNOSIS — R0781 Pleurodynia: Secondary | ICD-10-CM | POA: Insufficient documentation

## 2014-05-25 DIAGNOSIS — W19XXXA Unspecified fall, initial encounter: Secondary | ICD-10-CM | POA: Insufficient documentation

## 2014-05-25 DIAGNOSIS — J984 Other disorders of lung: Secondary | ICD-10-CM | POA: Insufficient documentation

## 2014-05-25 DIAGNOSIS — B9689 Other specified bacterial agents as the cause of diseases classified elsewhere: Secondary | ICD-10-CM | POA: Insufficient documentation

## 2014-05-25 DIAGNOSIS — J00 Acute nasopharyngitis [common cold]: Secondary | ICD-10-CM

## 2014-05-25 DIAGNOSIS — R05 Cough: Secondary | ICD-10-CM | POA: Diagnosis present

## 2014-05-25 MED ORDER — DOXYCYCLINE HYCLATE 100 MG PO CAPS: 100.0000 mg | ORAL_CAPSULE | Freq: Two times a day (BID) | ORAL | Status: DC

## 2014-05-25 NOTE — Patient Instructions (Signed)
Please go downstairs for an x-ray. I will call you with your results. Please take the antibiotic as directed. Use some plain Mucinex for congestion. Place a humidifier in the bedroom.

## 2014-05-25 NOTE — Progress Notes (Signed)
Patient presents to clinic today c/o 6 days of progressively worsening cough, fatigue and chest tenderness. Endorses chest pain that is mostly right sided with coughing. Has been running a low-grade fever 99-100. Denies recent travel. Has not taken anything for symptoms. Of note, patient experienced a fall last week, already evaluated by his PCP. Patient is concerned that some of his chest tenderness may be a result of fall.  Past Medical History  Diagnosis Date  . CAD (coronary artery disease)     MI 08-04-85  . Hyperlipidemia   . Peripheral neuropathy   . Hypertension   . Renal insufficiency     chronic w/ solitary kidney, congenital  . Depression   . Osteoarthritis   . Gout     diskitis 10/2008, Dr Ouida Sills  . Gait abnormality     chronic imbalance  . AAA (abdominal aortic aneurysm)   . Pneumonia     had right pneumonia pleurisy requiring resection of ribs and chest tube drainage at age 80  . Complication of anesthesia   . Small bowel obstruction 04/01/2012  . OSA (obstructive sleep apnea)     Limited CPAP tolerance  . Diabetes mellitus with neuropathy     Current Outpatient Prescriptions on File Prior to Visit  Medication Sig Dispense Refill  . aspirin 81 MG tablet Take 81 mg by mouth daily.     Marland Kitchen atorvastatin (LIPITOR) 80 MG tablet Take 1 tablet (80 mg total) by mouth daily. 30 tablet 5  . cyanocobalamin 100 MCG tablet Take 100 mcg by mouth daily.     . febuxostat (ULORIC) 40 MG tablet Take 40 mg by mouth daily.     . fish oil-omega-3 fatty acids 1000 MG capsule Take 2 g by mouth daily.     . Flaxseed, Linseed, (FLAXSEED OIL) 1000 MG CAPS Take 1,000 mg by mouth 2 (two) times daily.     . furosemide (LASIX) 40 MG tablet Take 1 tablet (40 mg total) by mouth daily. 30 tablet 5  . gabapentin (NEURONTIN) 400 MG capsule Take 2 capsules (800 mg total) by mouth 2 (two) times daily. 120 capsule 3  . glucosamine-chondroitin 500-400 MG tablet Take 1 tablet by mouth 3 (three) times  daily.     . Multiple Vitamins-Minerals (CENTRUM SILVER) tablet Take 1 tablet by mouth daily.     . niacin 500 MG tablet Take 500 mg by mouth at bedtime.     . ONE TOUCH ULTRA TEST test strip USE ONE EVERY DAY 100 each 2  . oxyCODONE-acetaminophen (PERCOCET) 7.5-325 MG per tablet Take 1 tablet by mouth 2 (two) times daily as needed for pain. 60 tablet 0  . polyethylene glycol (MIRALAX / GLYCOLAX) packet Take 17 g by mouth as needed for mild constipation or moderate constipation.     . predniSONE (DELTASONE) 5 MG tablet Take 2.5 mg by mouth daily.      No current facility-administered medications on file prior to visit.    Allergies  Allergen Reactions  . Colchicine     Unknown     Family History  Problem Relation Age of Onset  . Lymphoma Sister   . Colon cancer Neg Hx   . Prostate cancer Neg Hx     History   Social History  . Marital Status: Married    Spouse Name: N/A  . Number of Children: 2  . Years of Education: N/A   Occupational History  . retired    Social History Main Topics  .  Smoking status: Former Smoker    Quit date: 04/02/1972  . Smokeless tobacco: Never Used  . Alcohol Use: No     Comment: former heavy alcohol use  . Drug Use: No  . Sexual Activity: Not on file   Other Topics Concern  . None   Social History Narrative   Lost a son    Lives at home with wife    Still drives    Review of Systems - See HPI.  All other ROS are negative.  BP 96/53 mmHg  Pulse 76  Temp(Src) 99.7 F (37.6 C) (Oral)  Resp 16  Ht 5\' 8"  (1.727 m)  Wt 179 lb 2 oz (81.251 kg)  BMI 27.24 kg/m2  SpO2 98%  Physical Exam  Constitutional: He is oriented to person, place, and time and well-developed, well-nourished, and in no distress.  HENT:  Head: Normocephalic and atraumatic.  Right Ear: External ear normal.  Left Ear: External ear normal.  Nose: Nose normal.  Mouth/Throat: Oropharynx is clear and moist. No oropharyngeal exudate.  Tympanic membranes within normal  limits bilaterally.  Eyes: Conjunctivae are normal. Pupils are equal, round, and reactive to light.  Cardiovascular: Normal rate, regular rhythm, normal heart sounds and intact distal pulses.   Pulmonary/Chest: Effort normal and breath sounds normal. No respiratory distress. He has no wheezes. He has no rales. He exhibits no tenderness.  Neurological: He is alert and oriented to person, place, and time.  Skin: Skin is warm and dry. No rash noted.  Psychiatric: Affect normal.  Vitals reviewed.   No results found for this or any previous visit (from the past 2160 hour(s)).  Assessment/Plan: Acute bacterial bronchitis Giving fever and worsening symptoms, will obtain chest x-ray to rule out pneumonia. We'll begin doxycycline 100 mg twice a day 7 days. Increase fluids. Rest. Plain Mucinex. Humidifier in bedroom. Return precautions discussed with patient.

## 2014-05-25 NOTE — Assessment & Plan Note (Signed)
Giving fever and worsening symptoms, will obtain chest x-ray to rule out pneumonia. We'll begin doxycycline 100 mg twice a day 7 days. Increase fluids. Rest. Plain Mucinex. Humidifier in bedroom. Return precautions discussed with patient.

## 2014-05-25 NOTE — Progress Notes (Signed)
Pre visit review using our clinic review tool, if applicable. No additional management support is needed unless otherwise documented below in the visit note/SLS  

## 2014-06-08 ENCOUNTER — Telehealth: Payer: Self-pay | Admitting: Internal Medicine

## 2014-06-08 NOTE — Telephone Encounter (Signed)
Few days ago, the patient was at the office and he gave me some BP readings: 105/53 with a pulse of 76 140/68 with a pulse of 67. Plan: BP okay, but no more bradycardia since we stop beta blockers.

## 2014-06-09 ENCOUNTER — Telehealth: Payer: Self-pay

## 2014-06-09 NOTE — Telephone Encounter (Signed)
UDS: 05/22/2014  Positive for Percocet Positive for Hydrocodone: Per Dr. Larose Kells not prescribed Positive for Gabapentin  High Risk per Dr. Larose Kells 06/08/2014

## 2014-06-10 ENCOUNTER — Other Ambulatory Visit: Payer: Self-pay

## 2014-07-20 ENCOUNTER — Other Ambulatory Visit: Payer: Self-pay

## 2014-07-20 ENCOUNTER — Telehealth: Payer: Self-pay | Admitting: Internal Medicine

## 2014-07-20 MED ORDER — GLUCOSE BLOOD VI STRP: ORAL_STRIP | Status: DC

## 2014-07-20 MED ORDER — ONETOUCH ULTRASOFT LANCETS MISC: Status: DC

## 2014-07-20 NOTE — Telephone Encounter (Signed)
Refills for lancets, and test strips sent to pharmacy.

## 2014-07-20 NOTE — Telephone Encounter (Signed)
Caller name: Tommy Relation to pt: self Call back number: [phone] Pharmacy: [facility] [address] [facility] [address]  Reason for call:   Requesting refill of test strips and lancets

## 2014-07-23 ENCOUNTER — Other Ambulatory Visit: Payer: Self-pay

## 2014-07-27 ENCOUNTER — Telehealth: Payer: Self-pay | Admitting: Internal Medicine

## 2014-07-27 MED ORDER — OXYCODONE-ACETAMINOPHEN 7.5-325 MG PO TABS: 1.0000 | ORAL_TABLET | Freq: Two times a day (BID) | ORAL | Status: DC | PRN

## 2014-07-27 NOTE — Telephone Encounter (Signed)
Rx printed, awaiting MD signature.  

## 2014-07-27 NOTE — Telephone Encounter (Signed)
Relation to pt: self  Call back number: [phone]   Reason for call:  Pt requesting a refill oxyCODONE-acetaminophen (PERCOCET) 7.5-325 MG per tablet

## 2014-07-27 NOTE — Telephone Encounter (Signed)
Pt is requesting refill on Oxycodone.  Last OV: 05/19/2014 Last Fill: 05/22/2014 #60 0RF UDS: 05/22/2014 High Risk  Please advise.

## 2014-07-27 NOTE — Telephone Encounter (Signed)
Spoke with Wells Guiles, Pt's wife, informed her that Rx is ready for pick up at front desk. Wells Guiles verbalized understanding.

## 2014-07-27 NOTE — Telephone Encounter (Signed)
Ok #60 UDS at time of the pick up

## 2014-08-20 ENCOUNTER — Telehealth: Payer: Self-pay

## 2014-08-20 ENCOUNTER — Encounter: Payer: Self-pay | Admitting: Internal Medicine

## 2014-08-20 ENCOUNTER — Ambulatory Visit (INDEPENDENT_AMBULATORY_CARE_PROVIDER_SITE_OTHER): Payer: PPO | Admitting: Internal Medicine

## 2014-08-20 VITALS — BP 118/76 | HR 68 | Temp 97.4°F | Ht 68.0 in | Wt 179.2 lb

## 2014-08-20 DIAGNOSIS — N19 Unspecified kidney failure: Secondary | ICD-10-CM

## 2014-08-20 DIAGNOSIS — G609 Hereditary and idiopathic neuropathy, unspecified: Secondary | ICD-10-CM | POA: Diagnosis not present

## 2014-08-20 DIAGNOSIS — E7800 Pure hypercholesterolemia, unspecified: Secondary | ICD-10-CM

## 2014-08-20 DIAGNOSIS — E785 Hyperlipidemia, unspecified: Secondary | ICD-10-CM | POA: Diagnosis not present

## 2014-08-20 DIAGNOSIS — M159 Polyosteoarthritis, unspecified: Secondary | ICD-10-CM

## 2014-08-20 DIAGNOSIS — M150 Primary generalized (osteo)arthritis: Secondary | ICD-10-CM

## 2014-08-20 LAB — BASIC METABOLIC PANEL
BUN: 32 mg/dL — AB (ref 6–23)
CO2: 28 meq/L (ref 19–32)
CREATININE: 1.52 mg/dL — AB (ref 0.40–1.50)
Calcium: 10.4 mg/dL (ref 8.4–10.5)
Chloride: 100 mEq/L (ref 96–112)
GFR: 46.52 mL/min — AB (ref >60.00)
Glucose, Bld: 175 mg/dL — ABNORMAL HIGH (ref 70–99)
Potassium: 3.8 mEq/L (ref 3.5–5.1)
SODIUM: 135 meq/L (ref 135–145)

## 2014-08-20 LAB — LIPID PANEL
CHOLESTEROL: 136 mg/dL (ref 0–200)
HDL: 32.8 mg/dL — ABNORMAL LOW (ref >39.00)
LDL CALC: 78 mg/dL (ref 0–99)
NONHDL: 103.2
Total CHOL/HDL Ratio: 4
Triglycerides: 126 mg/dL (ref 0.0–149.0)
VLDL: 25.2 mg/dL (ref 0.0–40.0)

## 2014-08-20 MED ORDER — ATORVASTATIN CALCIUM 80 MG PO TABS: 80.0000 mg | ORAL_TABLET | Freq: Every day | ORAL | Status: DC

## 2014-08-20 MED ORDER — FUROSEMIDE 40 MG PO TABS: 40.0000 mg | ORAL_TABLET | Freq: Every day | ORAL | Status: DC

## 2014-08-20 NOTE — Telephone Encounter (Signed)
UDS: 07/27/2014   Positive for Oxycodone Positive for Hydrocodone-Not prescribed to Pt Positive for Gabapentin   High risk per Dr. Larose Kells- see OV note from 08/20/2014

## 2014-08-20 NOTE — Patient Instructions (Addendum)
Go to the lab  For pain only take oxycodone.  Do not take any other pain medication specifically do not take hydrocodone

## 2014-08-20 NOTE — Progress Notes (Signed)
Subjective:    Patient ID: Derrick Fry, male    DOB: 04-04-1927, 81 y.o.   MRN: [id number]  DOS:  08/20/2014 Type of visit - description : rov Interval history: No major complaints except that he developed red eye a week ago, on the left, getting better. History of diabetes, ambulatory blood sugars in the 130s  High cholesterol, good compliance of medication  Pain management, his UDS came back abnormal for the second time.   Review of Systems  Denies chest pain or difficulty breathing. No nausea, vomiting, diarrhea. No anxiety or depression   Past Medical History  Diagnosis Date  . CAD (coronary artery disease)     MI 08-04-85  . Hyperlipidemia   . Peripheral neuropathy   . Hypertension   . Renal insufficiency     chronic w/ solitary kidney, congenital  . Depression   . Osteoarthritis   . Gout     diskitis 10/2008, Dr Ouida Sills  . Gait abnormality     chronic imbalance  . AAA (abdominal aortic aneurysm)   . Pneumonia     had right pneumonia pleurisy requiring resection of ribs and chest tube drainage at age 58  . Complication of anesthesia   . Small bowel obstruction 04/01/2012  . OSA (obstructive sleep apnea)     Limited CPAP tolerance  . Diabetes mellitus with neuropathy     Past Surgical History  Procedure Laterality Date  . Inguinal hernia repair  03/07/83  . Coronary artery bypass graft  01/04/86  . Total knee arthroplasty  05/04/89    left  . Cataract extraction  09/1999,04/2003    rt,left  . Knuckles replaced  05/2005    left hand  . 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 Abdominal aortic aneurysm repair    . Nephrectomy  1996  . US echocardiography  01/14/2007    EF 55-60%  . Cardiovascular stress test  10/13/2009    EF 57%    History   Social History  . Marital Status: Married    Spouse Name: N/A  . Number of Children: 2  . Years of Education: N/A   Occupational History  . retired    Social  History Main Topics  . Smoking status: Former Smoker    Quit date: 04/02/1972  . Smokeless tobacco: Never Used  . Alcohol Use: No     Comment: former heavy alcohol use  . Drug Use: No  . Sexual Activity: Not on file   Other Topics Concern  . Not on file   Social History Narrative   Lost a son    Lives at home with wife    Still drives         Medication List       This list is accurate as of: 08/20/14 11:59 PM.  Always use your most recent med list.               aspirin 81 MG tablet  Take 81 mg by mouth daily.     atorvastatin 80 MG tablet  Commonly known as:  LIPITOR  Take 1 tablet (80 mg total) by mouth daily.     CENTRUM SILVER tablet  Take 1 tablet by mouth daily.     cyanocobalamin 100 MCG tablet  Take 100 mcg by mouth daily.     fish oil-omega-3 fatty acids 1000 MG capsule  Take 2 g by mouth daily.  Flaxseed Oil 1000 MG Caps  Take 1,000 mg by mouth 2 (two) times daily.     furosemide 40 MG tablet  Commonly known as:  LASIX  Take 1 tablet (40 mg total) by mouth daily.     gabapentin 400 MG capsule  Commonly known as:  NEURONTIN  Take 2 capsules (800 mg total) by mouth 2 (two) times daily.     glucosamine-chondroitin 500-400 MG tablet  Take 1 tablet by mouth 3 (three) times daily.     glucose blood test strip  Commonly known as:  ONE TOUCH ULTRA TEST  Check blood sugar no more than twice daily.     niacin 500 MG tablet  Take 500 mg by mouth at bedtime.     onetouch ultrasoft lancets  Check blood sugar no more than twice daily.     oxyCODONE-acetaminophen 7.5-325 MG per tablet  Commonly known as:  PERCOCET  Take 1 tablet by mouth 2 (two) times daily as needed.     polyethylene glycol packet  Commonly known as:  MIRALAX / GLYCOLAX  Take 17 g by mouth as needed for mild constipation or moderate constipation.     predniSONE 5 MG tablet  Commonly known as:  DELTASONE  Take 2.5 mg by mouth daily.     ULORIC 40 MG tablet  Generic drug:   febuxostat  Take 40 mg by mouth daily.           Objective:   Physical Exam BP 118/76 mmHg  Pulse 68  Temp(Src) 97.4 F (36.3 C) (Oral)  Ht 5\' 8"  (1.727 m)  Wt 179 lb 4 oz (81.307 kg)  BMI 27.26 kg/m2  SpO2 98%  General:   Well developed, well nourished . NAD.  HEENT:  Normocephalic . Face symmetric, atraumatic External ocular movements are intact, pupils are small but equal and reactive. Left conjunctiva slightly red  Ant chambers wnl  Lungs: dry  crackles at bases, otherwise normal  Normal respiratory effort, no intercostal retractions, no accessory muscle use. Heart: RRR,  no murmur.  no pretibial edema bilaterally   Neurologic:  alert & oriented X3.  Speech normal, gait appropriate for age and unassisted Psych--  Cognition and judgment appear intact.  Cooperative with normal attention span and concentration.  Behavior appropriate. No anxious or depressed appearing.      Assessment & Plan:    Red eyey likely has a conjunctival hemorrhage. Recommend observation  Chronic renal insufficiency, check a BMP.

## 2014-08-20 NOTE — Progress Notes (Signed)
Pre visit review using our clinic review tool, if applicable. No additional management support is needed unless otherwise documented below in the visit note. 

## 2014-08-21 NOTE — Assessment & Plan Note (Signed)
Pain management. Had a UDS  07/27/2014,   showed oxycodone and hydrocodone. This is the second time this happened. I think he is probably taking a extra hydrocodone from his wife's supply. Strongly recommend the patient not to take 2 type of pain medications, if his pain is not well-controlled with oxycodone he needs to let me know. Plan: Recheck a UDS in few weeks

## 2014-08-21 NOTE — Assessment & Plan Note (Signed)
Continue atorvastatin, refill provided, check FLP although he is not fasting.

## 2014-08-21 NOTE — Assessment & Plan Note (Addendum)
See comments under pain management

## 2014-08-26 ENCOUNTER — Telehealth: Payer: Self-pay | Admitting: Internal Medicine

## 2014-08-26 MED ORDER — OXYCODONE-ACETAMINOPHEN 7.5-325 MG PO TABS: 1.0000 | ORAL_TABLET | Freq: Two times a day (BID) | ORAL | Status: DC | PRN

## 2014-08-26 NOTE — Telephone Encounter (Signed)
Rx placed at front desk for pick up at Pt's convenience.

## 2014-08-26 NOTE — Telephone Encounter (Signed)
Left message informing patient of this.  °

## 2014-08-26 NOTE — Telephone Encounter (Signed)
Rx printed for July 2016. Awaiting MD signature.

## 2014-08-26 NOTE — Telephone Encounter (Signed)
Pt is requesting refill on Oxycodone.  Last OV: 08/20/2014 Last Fill: 07/27/2014 #60 0RF UDS: 08/20/2014 High risk   Please advise.

## 2014-08-26 NOTE — Telephone Encounter (Signed)
Ok 60, no RF We'll get a UDS with next refill

## 2014-08-26 NOTE — Telephone Encounter (Signed)
Caller name: Ferrel Relation to pt: Call back number: [phone] Pharmacy:  Reason for call:   Requesting oxycodone rx

## 2014-08-31 ENCOUNTER — Other Ambulatory Visit (INDEPENDENT_AMBULATORY_CARE_PROVIDER_SITE_OTHER): Payer: PPO | Admitting: *Deleted

## 2014-08-31 ENCOUNTER — Other Ambulatory Visit: Payer: Self-pay | Admitting: Nurse Practitioner

## 2014-08-31 ENCOUNTER — Telehealth: Payer: Self-pay

## 2014-08-31 DIAGNOSIS — E785 Hyperlipidemia, unspecified: Secondary | ICD-10-CM

## 2014-08-31 LAB — HEPATIC FUNCTION PANEL
ALT: 15 U/L (ref 0–53)
AST: 26 U/L (ref 0–37)
Albumin: 3.9 g/dL (ref 3.5–5.2)
Alkaline Phosphatase: 64 U/L (ref 39–117)
Bilirubin, Direct: 0 mg/dL (ref 0.0–0.3)
Total Bilirubin: 0.7 mg/dL (ref 0.2–1.2)
Total Protein: 7 g/dL (ref 6.0–8.3)

## 2014-08-31 LAB — LIPID PANEL
CHOL/HDL RATIO: 4
CHOLESTEROL: 131 mg/dL (ref 0–200)
HDL: 32.4 mg/dL — ABNORMAL LOW (ref >39.00)
LDL Cholesterol: 73 mg/dL (ref 0–99)
NonHDL: 98.6
TRIGLYCERIDES: 130 mg/dL (ref 0.0–149.0)
VLDL: 26 mg/dL (ref 0.0–40.0)

## 2014-08-31 LAB — BASIC METABOLIC PANEL
BUN: 26 mg/dL — ABNORMAL HIGH (ref 6–23)
CO2: 28 meq/L (ref 19–32)
Calcium: 9.9 mg/dL (ref 8.4–10.5)
Chloride: 102 mEq/L (ref 96–112)
Creatinine, Ser: 1.37 mg/dL (ref 0.40–1.50)
GFR: 52.44 mL/min — AB (ref >60.00)
Glucose, Bld: 136 mg/dL — ABNORMAL HIGH (ref 70–99)
Potassium: 4 mEq/L (ref 3.5–5.1)
Sodium: 137 mEq/L (ref 135–145)

## 2014-08-31 NOTE — Telephone Encounter (Signed)
UDS: 08/20/2014  Positive for Oxycodone Positive for Hydrocodone (Not prescribed) Positive for Gabapentin   Pt advised at last OV not to take Hydrocodone. Will recheck in 1 month or with next refill   High Risk per Dr. Larose Kells 08/28/2014

## 2014-09-01 ENCOUNTER — Ambulatory Visit (INDEPENDENT_AMBULATORY_CARE_PROVIDER_SITE_OTHER): Payer: PPO | Admitting: Cardiovascular Disease

## 2014-09-01 ENCOUNTER — Telehealth: Payer: Self-pay | Admitting: *Deleted

## 2014-09-01 ENCOUNTER — Encounter: Payer: Self-pay | Admitting: Cardiovascular Disease

## 2014-09-01 VITALS — BP 124/64 | HR 80 | Ht 68.0 in | Wt 178.0 lb

## 2014-09-01 DIAGNOSIS — E785 Hyperlipidemia, unspecified: Secondary | ICD-10-CM | POA: Diagnosis not present

## 2014-09-01 DIAGNOSIS — I2510 Atherosclerotic heart disease of native coronary artery without angina pectoris: Secondary | ICD-10-CM

## 2014-09-01 NOTE — Patient Instructions (Signed)
Medication Instructions:  Your physician recommends that you continue on your current medications as directed. Please refer to the Current Medication list given to you today.   Labwork: Your physician recommends that you return for lab work in: 1 year on the day of or a few days before your office visit with Dr. Nahser.  You will need to FAST for this appointment - nothing to eat or drink after midnight the night before except water.    Testing/Procedures: None Ordered   Follow-Up: Your physician wants you to follow-up in: 1 year with Dr. Nahser.  You will receive a reminder letter in the mail two months in advance. If you don't receive a letter, please call our office to schedule the follow-up appointment.    

## 2014-09-01 NOTE — Telephone Encounter (Signed)
Pt dropped off disability parking placard application (x2). Forwarded to Dr. Larose Kells. [id number]

## 2014-09-01 NOTE — Progress Notes (Signed)
Derrick Fry Date of Birth  1928/01/15 [place] [address]    [place]   [place], [place]  [address]    [place], [place]  [address] [phone]  Fax  [phone]  [phone]  Fax [phone]  Problem List: 1. CAD - s/p CABG ( 1987, Derrick Fry) 2.  AAA - repair 3. Hypertension 4. Hyperlipidemia    History of Present Illness:  Derrick Fry is an 81 y.o. gentleman with the above noted hx.  He does not exercise much because of his arthritis.  He is active in the summertime.  August 29, 2012:  Derrick Fry is doing well.  He did not plant a garden this year. He stays active doing yard work.  Lipids are check at his medical doctors office.  August 27, 2013:  He takes his BP regularly,  It is usually well controlled.  No CP.  Able to do most of his normal activities.   A bit weaker - has some arthritis in his back.   Also has some hip pain.  September 01, 2014:   Derrick Fry is doing well.   No angina.  No dyspnea.  No PND or orthopnea No CP  He had CABG 29 years ago .   Wants to consider right knee replacement.     Current Outpatient Prescriptions on File Prior to Visit  Medication Sig Dispense Refill  . aspirin 81 MG tablet Take 81 mg by mouth daily.     Marland Kitchen atorvastatin (LIPITOR) 80 MG tablet Take 1 tablet (80 mg total) by mouth daily. 30 tablet 8  . cyanocobalamin 100 MCG tablet Take 100 mcg by mouth daily.     . febuxostat (ULORIC) 40 MG tablet Take 40 mg by mouth daily.     . fish oil-omega-3 fatty acids 1000 MG capsule Take 2 g by mouth daily.     . Flaxseed, Linseed, (FLAXSEED OIL) 1000 MG CAPS Take 1,000 mg by mouth 2 (two) times daily.     . furosemide (LASIX) 40 MG tablet Take 1 tablet (40 mg total) by mouth daily. 30 tablet 8  . gabapentin (NEURONTIN) 400 MG capsule Take 2 capsules (800 mg total) by mouth 2 (two) times daily. 120 capsule 3  . glucosamine-chondroitin 500-400 MG tablet Take 1 tablet by mouth 3 (three) times daily.     Marland Kitchen  glucose blood (ONE TOUCH ULTRA TEST) test strip Check blood sugar no more than twice daily. 100 each 11  . Lancets (ONETOUCH ULTRASOFT) lancets Check blood sugar no more than twice daily. 100 each 11  . Multiple Vitamins-Minerals (CENTRUM SILVER) tablet Take 1 tablet by mouth daily.     . niacin 500 MG tablet Take 500 mg by mouth at bedtime.     Marland Kitchen oxyCODONE-acetaminophen (PERCOCET) 7.5-325 MG per tablet Take 1 tablet by mouth 2 (two) times daily as needed. 60 tablet 0  . polyethylene glycol (MIRALAX / GLYCOLAX) packet Take 17 g by mouth as needed for mild constipation or moderate constipation.     . predniSONE (DELTASONE) 5 MG tablet Take 2.5 mg by mouth daily.      No current facility-administered medications on file prior to visit.    Allergies  Allergen Reactions  . Colchicine     Unknown     Past Medical History  Diagnosis Date  . CAD (coronary artery disease)     MI 08-04-85  . Hyperlipidemia   . Peripheral neuropathy   .  Hypertension   . Renal insufficiency     chronic w/ solitary kidney, congenital  . Depression   . Osteoarthritis   . Gout     diskitis 10/2008, Dr Derrick Fry  . Gait abnormality     chronic imbalance  . AAA (abdominal aortic aneurysm)   . Pneumonia     had right pneumonia pleurisy requiring resection of ribs and chest tube drainage at age 3  . Complication of anesthesia   . Small bowel obstruction 04/01/2012  . OSA (obstructive sleep apnea)     Limited CPAP tolerance  . Diabetes mellitus with neuropathy     Past Surgical History  Procedure Laterality Date  . Inguinal hernia repair  03/07/83  . Coronary artery bypass graft  01/04/86  . Total knee arthroplasty  05/04/89    left  . Cataract extraction  09/1999,04/2003    rt,left  . Knuckles replaced  05/2005    left hand  . 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 Abdominal aortic aneurysm repair    . Nephrectomy  1996  . US echocardiography  01/14/2007    EF  55-60%  . Cardiovascular stress test  10/13/2009    EF 57%    History  Smoking status  . Former Smoker  . Quit date: 04/02/1972  Smokeless tobacco  . Never Used    History  Alcohol Use No    Comment: former heavy alcohol use    Family History  Problem Relation Age of Onset  . Lymphoma Sister   . Colon cancer Neg Hx   . Prostate cancer Neg Hx     Reviw of Systems:  Reviewed in the HPI.  All other systems are negative.  Physical Exam: Blood pressure 124/64, pulse 80, height 5\' 8"  (1.727 m), weight 80.74 kg (178 lb). General: Well developed, well nourished, in no acute distress.  Head: Normocephalic, atraumatic, sclera non-icteric, mucus membranes are moist,   Neck: Supple. Negative for carotid bruits. JVD not elevated.  Lungs: Clear bilaterally to auscultation without wheezes, rales, or rhonchi. Breathing is unlabored.  Heart: RRR with S1 S2.  There is a soft systolic murmur.    Abdomen: Soft, non-tender, non-distended with normoactive bowel sounds. No hepatomegaly. No rebound/guarding. Moderate sized ventral hernia .  Msk:  Strength and tone appear normal for age.  Extremities: No clubbing or cyanosis. Trace  edema.  Distal pedal pulses are 2+ and equal bilaterally.  Neuro: Alert and oriented X 3. Moves all extremities spontaneously.  Psych:  Responds to questions appropriately with a normal affect.  ECG: June 28 , 2016:  NSR at 80.  LAHB.   LVH.  No ST or T wave change.   Assessment / Plan:   1. CAD - s/p CABG ( 1987, Derrick Fry)- doing well.  No angina .  Continue current meds.    2.  AAA - repair - stable. Complicated by a moderate sized ventral hernia   3. Hypertension - BP is stable   4. Hyperlipidemia - labs look great     Derrick Fry, Derrick Cheng, MD  09/01/2014 11:57 AM    [place],  [place], [place]  [address] Pager [phone] Phone: [phone]; Fax: [phone]   [facility]  [address] [place], [place]  [address] [phone]    Fax [phone]

## 2014-09-09 NOTE — Telephone Encounter (Signed)
Mailed to pt's home address per pt request. Copy sent for scanning. JG//CMA

## 2014-09-28 ENCOUNTER — Telehealth: Payer: Self-pay | Admitting: Internal Medicine

## 2014-09-28 MED ORDER — OXYCODONE-ACETAMINOPHEN 7.5-325 MG PO TABS: 1.0000 | ORAL_TABLET | Freq: Two times a day (BID) | ORAL | Status: DC | PRN

## 2014-09-28 NOTE — Telephone Encounter (Signed)
Cabot for #60, repeat UDS

## 2014-09-28 NOTE — Telephone Encounter (Signed)
Patient advised.

## 2014-09-28 NOTE — Telephone Encounter (Signed)
Relation to pt: self  Call back number: [phone]   Reason for call:  Patient requesting a refill oxyCODONE-acetaminophen (PERCOCET) 7.5-325 MG per tablet

## 2014-09-28 NOTE — Telephone Encounter (Signed)
Please inform Pt that Rx is ready for pick up at front desk for his convenience. Thank you.

## 2014-09-28 NOTE — Telephone Encounter (Signed)
Pt is requesting refill on Oxycodone.  Last OV: 08/20/2014  Last Fill: 08/26/2014 #60 0RF UDS: 08/20/2014 High Risk   Please advise.

## 2014-09-28 NOTE — Telephone Encounter (Signed)
Rx printed, awaiting MD signature.  

## 2014-10-07 ENCOUNTER — Encounter: Payer: Self-pay | Admitting: *Deleted

## 2014-10-07 ENCOUNTER — Telehealth: Payer: Self-pay

## 2014-10-07 NOTE — Telephone Encounter (Signed)
UDS: 09/28/2014  Positive for Oxycodone Positive for Gabapentin   Low risk per Dr. Larose Kells 10/07/2014

## 2014-10-27 ENCOUNTER — Telehealth: Payer: Self-pay | Admitting: Internal Medicine

## 2014-10-27 MED ORDER — OXYCODONE-ACETAMINOPHEN 7.5-325 MG PO TABS: 1.0000 | ORAL_TABLET | Freq: Two times a day (BID) | ORAL | Status: DC | PRN

## 2014-10-27 NOTE — Telephone Encounter (Signed)
Rx for August and September 2016 printed, awaiting MD signature.

## 2014-10-27 NOTE — Telephone Encounter (Signed)
Pt is requesting refill on Oxycodone.  Last OV: 09/01/2014 Last Fill: 09/28/2014 #60 0RF UDS: 09/28/2014 Low risk  Please advise.

## 2014-10-27 NOTE — Telephone Encounter (Signed)
Tried to contact pt no vm to leave message, you may want to continue to contact the pt

## 2014-10-27 NOTE — Telephone Encounter (Signed)
Please inform Pt that Rx has been placed at front desk for pick up at his convenience. Thank you.  

## 2014-10-27 NOTE — Telephone Encounter (Signed)
°  Relation to [id number] Call back [phone] Pharmacy:  Reason for call: pt is needing rx oxyCODONE-acetaminophen (PERCOCET) 7.5-325 MG per tablet  Please call when ready and available for pick up

## 2014-10-27 NOTE — Telephone Encounter (Signed)
Okay #60, 2 prescriptions 

## 2014-11-20 ENCOUNTER — Ambulatory Visit (INDEPENDENT_AMBULATORY_CARE_PROVIDER_SITE_OTHER): Payer: PPO | Admitting: Internal Medicine

## 2014-11-20 ENCOUNTER — Encounter: Payer: Self-pay | Admitting: Internal Medicine

## 2014-11-20 VITALS — BP 130/78 | HR 55 | Temp 97.8°F | Ht 68.0 in | Wt 180.5 lb

## 2014-11-20 DIAGNOSIS — D229 Melanocytic nevi, unspecified: Secondary | ICD-10-CM

## 2014-11-20 DIAGNOSIS — E119 Type 2 diabetes mellitus without complications: Secondary | ICD-10-CM

## 2014-11-20 DIAGNOSIS — Z09 Encounter for follow-up examination after completed treatment for conditions other than malignant neoplasm: Secondary | ICD-10-CM

## 2014-11-20 DIAGNOSIS — G609 Hereditary and idiopathic neuropathy, unspecified: Secondary | ICD-10-CM | POA: Diagnosis not present

## 2014-11-20 DIAGNOSIS — R7309 Other abnormal glucose: Secondary | ICD-10-CM

## 2014-11-20 DIAGNOSIS — R7303 Prediabetes: Secondary | ICD-10-CM

## 2014-11-20 DIAGNOSIS — I781 Nevus, non-neoplastic: Secondary | ICD-10-CM

## 2014-11-20 LAB — HEMOGLOBIN A1C: Hgb A1c MFr Bld: 6.5 % (ref 4.6–6.5)

## 2014-11-20 LAB — HM DIABETES FOOT EXAM

## 2014-11-20 MED ORDER — GABAPENTIN 400 MG PO CAPS: 800.0000 mg | ORAL_CAPSULE | Freq: Three times a day (TID) | ORAL | Status: DC

## 2014-11-20 NOTE — Assessment & Plan Note (Signed)
Diabetes: Due for a A1c. Feet exam performed today Pain management, neuropathy:UDS low 10-2014. Symptoms are neuropathy slightly increase in the last month. Will increase gabapentin from BID to TID. Patient will call if no improvement. Nevus: See physical exam, refer to his skin doctor Dr. Allyson Sabal Other  medical problems seem stable Primary care: He had a flu elsewhere

## 2014-11-20 NOTE — Progress Notes (Signed)
Subjective:    Patient ID: Derrick Fry, male    DOB: 08-31-1927, 81 y.o.   MRN: [id number]  DOS:  11/20/2014 Type of visit - description : rov Interval history: CAD: Note from cardiology 08/2014 reviewed, he is felt to be stable. Diabetes: On diet control, due for a A1c Neuropathy: Symptoms well-controlled for year except in the last month, had 2 exacerbations described as severe pain/discomfort at the feet at night.  Was hard to fall asleep, by the next day he was better. There were no symptoms consistent with gout such as redness or swelling of the toes.Denies intense back pain, no claudication. pain management: Last UDS low risk 10-2014 High cholesterol: Good compliance with Lipitor, last cholesterol satisfactory   Review of Systems denies chest pain or difficulty breathing. No nausea, vomiting, diarrhea  Past Medical History  Diagnosis Date  . CAD (coronary artery disease)     MI 08-04-85  . Hyperlipidemia   . Peripheral neuropathy   . Hypertension   . Renal insufficiency     chronic w/ solitary kidney, congenital  . Depression   . Osteoarthritis   . Gout     diskitis 10/2008, Dr Ouida Sills  . Gait abnormality     chronic imbalance  . AAA (abdominal aortic aneurysm)   . Pneumonia     had right pneumonia pleurisy requiring resection of ribs and chest tube drainage at age 43  . Complication of anesthesia   . Small bowel obstruction 04/01/2012  . OSA (obstructive sleep apnea)     Limited CPAP tolerance  . Diabetes mellitus with neuropathy     Past Surgical History  Procedure Laterality Date  . Inguinal hernia repair  03/07/83  . Coronary artery bypass graft  01/04/86  . Total knee arthroplasty  05/04/89    left  . Cataract extraction  09/1999,04/2003    rt,left  . Knuckles replaced  05/2005    left hand  . 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 Abdominal aortic aneurysm repair    . Nephrectomy  1996  . US echocardiography   01/14/2007    EF 55-60%  . Cardiovascular stress test  10/13/2009    EF 57%    Social History   Social History  . Marital Status: Married    Spouse Name: N/A  . Number of Children: 2  . Years of Education: N/A   Occupational History  . retired    Social History Main Topics  . Smoking status: Former Smoker    Quit date: 04/02/1972  . Smokeless tobacco: Never Used  . Alcohol Use: No     Comment: former heavy alcohol use  . Drug Use: No  . Sexual Activity: Not on file   Other Topics Concern  . Not on file   Social History Narrative   Lost a son    Lives at home with wife    Still drives         Medication List       This list is accurate as of: 11/20/14  5:33 PM.  Always use your most recent med list.               aspirin 81 MG tablet  Take 81 mg by mouth daily.     atorvastatin 80 MG tablet  Commonly known as:  LIPITOR  Take 1 tablet (80 mg total) by mouth daily.     CENTRUM SILVER  tablet  Take 1 tablet by mouth daily.     cyanocobalamin 100 MCG tablet  Take 100 mcg by mouth daily.     fish oil-omega-3 fatty acids 1000 MG capsule  Take 2 g by mouth daily.     Flaxseed Oil 1000 MG Caps  Take 1,000 mg by mouth 2 (two) times daily.     furosemide 40 MG tablet  Commonly known as:  LASIX  Take 1 tablet (40 mg total) by mouth daily.     gabapentin 400 MG capsule  Commonly known as:  NEURONTIN  Take 2 capsules (800 mg total) by mouth 3 (three) times daily.     glucosamine-chondroitin 500-400 MG tablet  Take 1 tablet by mouth 3 (three) times daily.     glucose blood test strip  Commonly known as:  ONE TOUCH ULTRA TEST  Check blood sugar no more than twice daily.     niacin 500 MG tablet  Take 500 mg by mouth at bedtime.     onetouch ultrasoft lancets  Check blood sugar no more than twice daily.     oxyCODONE-acetaminophen 7.5-325 MG per tablet  Commonly known as:  PERCOCET  Take 1 tablet by mouth 2 (two) times daily as needed.      polyethylene glycol packet  Commonly known as:  MIRALAX / GLYCOLAX  Take 17 g by mouth as needed for mild constipation or moderate constipation.     predniSONE 5 MG tablet  Commonly known as:  DELTASONE  Take 2.5 mg by mouth daily.     ULORIC 40 MG tablet  Generic drug:  febuxostat  Take 40 mg by mouth daily.           Objective:   Physical Exam BP 130/78 mmHg  Pulse 55  Temp(Src) 97.8 F (36.6 C) (Oral)  Ht 5\' 8"  (1.727 m)  Wt 180 lb 8 oz (81.874 kg)  BMI 27.45 kg/m2  SpO2 99% General:   Well developed, well nourished . NAD.  HEENT:  Normocephalic . Face symmetric, atraumatic Lungs:  Dry crackles at bases Normal respiratory effort, no intercostal retractions, no accessory muscle use. Heart: RRR,  no murmur.  No pretibial edema bilaterally  Diabetic feet exam: No edema, pinprick examination+ for insensitive neuropathy At the plantar aspect of the right great toe he has a 2 mm dark lesion, (nevus) Neurologic:  alert & oriented X3.  Speech normal, gait appropriate for age and unassisted Psych--  Cognition and judgment appear intact.  Cooperative with normal attention span and concentration.  Behavior appropriate. No anxious or depressed appearing.      Assessment & Plan:   Problem list>  DM with neuropathy Pain mngmt: on oxycodone d/t neuropathy, UDS 10-2014 low risk HTN Hyperlipidemia CRI congenital solitary kidney CAD MI 1987 AAA s/p repair DJD Gout --diskitis 2010, on prednisone, DR Ouida Sills OSA Limited CPAP tolerance SPO2 thousand 14 Pneumonia, R side at age 55 , pleurisy, chest tube, rib surgery   A/P Diabetes: Due for a A1c. Feet exam performed today Pain management, neuropathy:UDS low 10-2014. Symptoms are neuropathy slightly increase in the last month. Will increase gabapentin from BID to TID. Patient will call if no improvement. Nevus: See physical exam, refer to his skin doctor Dr. Allyson Sabal Other  medical problems seem stable Primary care: He  had a flu elsewhere

## 2014-11-20 NOTE — Progress Notes (Signed)
Pre visit review using our clinic review tool, if applicable. No additional management support is needed unless otherwise documented below in the visit note. 

## 2014-11-20 NOTE — Patient Instructions (Signed)
Get your blood work before you leave   increase gabapentin: 2 tablets 3 times a day  We are referring you to a skin doctor   Next visit  for a   Physical exam in 4-5 months  Please schedule an appointment at the front desk Please come back fasting    Diabetes and Foot Care Diabetes may cause you to have problems because of poor blood supply (circulation) to your feet and legs. This may cause the skin on your feet to become thinner, break easier, and heal more slowly. Your skin may become dry, and the skin may peel and crack. You may also have nerve damage in your legs and feet causing decreased feeling in them. You may not notice minor injuries to your feet that could lead to infections or more serious problems. Taking care of your feet is one of the most important things you can do for yourself.  HOME CARE INSTRUCTIONS  Wear shoes at all times, even in the house. Do not go barefoot. Bare feet are easily injured.  Check your feet daily for blisters, cuts, and redness. If you cannot see the bottom of your feet, use a mirror or ask someone for help.  Wash your feet with warm water (do not use hot water) and mild soap. Then pat your feet and the areas between your toes until they are completely dry. Do not soak your feet as this can dry your skin.  Apply a moisturizing lotion or petroleum jelly (that does not contain alcohol and is unscented) to the skin on your feet and to dry, brittle toenails. Do not apply lotion between your toes.  Trim your toenails straight across. Do not dig under them or around the cuticle. File the edges of your nails with an emery board or nail file.  Do not cut corns or calluses or try to remove them with medicine.  Wear clean socks or stockings every day. Make sure they are not too tight. Do not wear knee-high stockings since they may decrease blood flow to your legs.  Wear shoes that fit properly and have enough cushioning. To break in new shoes, wear them for  just a few hours a day. This prevents you from injuring your feet. Always look in your shoes before you put them on to be sure there are no objects inside.  Do not cross your legs. This may decrease the blood flow to your feet.  If you find a minor scrape, cut, or break in the skin on your feet, keep it and the skin around it clean and dry. These areas may be cleansed with mild soap and water. Do not cleanse the area with peroxide, alcohol, or iodine.  When you remove an adhesive bandage, be sure not to damage the skin around it.  If you have a wound, look at it several times a day to make sure it is healing.  Do not use heating pads or hot water bottles. They may burn your skin. If you have lost feeling in your feet or legs, you may not know it is happening until it is too late.  Make sure your health care provider performs a complete foot exam at least annually or more often if you have foot problems. Report any cuts, sores, or bruises to your health care provider immediately. SEEK MEDICAL CARE IF:   You have an injury that is not healing.  You have cuts or breaks in the skin.  You have an ingrown  nail.  You notice redness on your legs or feet.  You feel burning or tingling in your legs or feet.  You have pain or cramps in your legs and feet.  Your legs or feet are numb.  Your feet always feel cold. SEEK IMMEDIATE MEDICAL CARE IF:   There is increasing redness, swelling, or pain in or around a wound.  There is a red line that goes up your leg.  Pus is coming from a wound.  You develop a fever or as directed by your health care provider.  You notice a bad smell coming from an ulcer or wound. Document Released: 02/18/2000 Document Revised: 10/23/2012 Document Reviewed: 07/30/2012 [facility] Patient Information 2015 [place], [place]. This information is not intended to replace advice given to you by your health care provider. Make sure you discuss any questions you have with  your health care provider.

## 2015-02-11 LAB — HM DIABETES EYE EXAM

## 2015-02-18 ENCOUNTER — Encounter: Payer: Self-pay | Admitting: Internal Medicine

## 2015-02-25 ENCOUNTER — Other Ambulatory Visit: Payer: Self-pay | Admitting: Internal Medicine

## 2015-02-25 ENCOUNTER — Telehealth: Payer: Self-pay | Admitting: Internal Medicine

## 2015-02-25 NOTE — Telephone Encounter (Signed)
Pt needing refill on oxycodone. Pt has 1 left. He takes prn (1/day right now). Please call when RX ready for pick up at [phone].

## 2015-02-26 MED ORDER — OXYCODONE-ACETAMINOPHEN 7.5-325 MG PO TABS: 1.0000 | ORAL_TABLET | Freq: Two times a day (BID) | ORAL | Status: DC | PRN

## 2015-02-26 NOTE — Telephone Encounter (Signed)
Ok 60, no RF 

## 2015-02-26 NOTE — Telephone Encounter (Signed)
Rx printed, awaiting MD signature.  

## 2015-02-26 NOTE — Telephone Encounter (Signed)
Please inform Pt that Rx has been placed at front desk for pick up at his convenience. Thank you.  

## 2015-02-26 NOTE — Telephone Encounter (Signed)
Pt is requesting refill on Oxycodone.  Last OV: 11/20/2014  Last Fill: 10/27/2014 #60 and 0RF UDS: 09/28/2014 Low risk  Please advise.

## 2015-02-26 NOTE — Telephone Encounter (Signed)
Notified pt. 

## 2015-03-08 ENCOUNTER — Other Ambulatory Visit: Payer: Self-pay | Admitting: Internal Medicine

## 2015-03-22 DIAGNOSIS — M150 Primary generalized (osteo)arthritis: Secondary | ICD-10-CM | POA: Diagnosis not present

## 2015-03-22 DIAGNOSIS — M2550 Pain in unspecified joint: Secondary | ICD-10-CM | POA: Diagnosis not present

## 2015-03-22 DIAGNOSIS — M25561 Pain in right knee: Secondary | ICD-10-CM | POA: Diagnosis not present

## 2015-03-22 DIAGNOSIS — Z79899 Other long term (current) drug therapy: Secondary | ICD-10-CM | POA: Diagnosis not present

## 2015-03-22 DIAGNOSIS — M5136 Other intervertebral disc degeneration, lumbar region: Secondary | ICD-10-CM | POA: Diagnosis not present

## 2015-03-22 DIAGNOSIS — L4059 Other psoriatic arthropathy: Secondary | ICD-10-CM | POA: Diagnosis not present

## 2015-03-22 DIAGNOSIS — M1A09X1 Idiopathic chronic gout, multiple sites, with tophus (tophi): Secondary | ICD-10-CM | POA: Diagnosis not present

## 2015-03-30 ENCOUNTER — Telehealth: Payer: Self-pay | Admitting: Internal Medicine

## 2015-03-30 MED ORDER — OXYCODONE-ACETAMINOPHEN 7.5-325 MG PO TABS: 1.0000 | ORAL_TABLET | Freq: Two times a day (BID) | ORAL | Status: DC | PRN

## 2015-03-30 NOTE — Telephone Encounter (Signed)
Patient informed. 

## 2015-03-30 NOTE — Telephone Encounter (Signed)
Relation to [id number] Call back number:[phone]   Reason for call:  Patient requesting a refill oxyCODONE-acetaminophen (PERCOCET) 7.5-325 MG tablet

## 2015-03-30 NOTE — Telephone Encounter (Signed)
Okay #60, 2 prescriptions 

## 2015-03-30 NOTE — Telephone Encounter (Signed)
Rx for January and February 2017 printed, awaiting MD signature.

## 2015-03-30 NOTE — Telephone Encounter (Signed)
Pt is requesting refill on Oxycodone.  Last OV: 11/20/2014 Last Fill: 02/26/2015 #60 and 0RF UDS: 09/28/2014 Low risk  Please advise.

## 2015-03-30 NOTE — Telephone Encounter (Signed)
Please inform Pt that Rx has been placed at front desk and is ready for pick up. Thank you.

## 2015-04-21 ENCOUNTER — Telehealth: Payer: Self-pay | Admitting: Behavioral Health

## 2015-04-21 ENCOUNTER — Encounter: Payer: Self-pay | Admitting: Behavioral Health

## 2015-04-21 NOTE — Addendum Note (Signed)
Addended by: Kathlen Brunswick on: 04/21/2015 05:12 PM   Modules accepted: Orders, Medications

## 2015-04-21 NOTE — Telephone Encounter (Signed)
Returned the patient's call, but unable to reach the patient at this time. Will follow-up.

## 2015-04-21 NOTE — Telephone Encounter (Signed)
Pre-Visit Call completed with patient and chart updated.   Pre-Visit Info documented in Specialty Comments under [employer].    

## 2015-04-21 NOTE — Telephone Encounter (Signed)
Per the patient's wife, he's unavailable at this time, but will have him return the call later.

## 2015-04-22 ENCOUNTER — Ambulatory Visit (INDEPENDENT_AMBULATORY_CARE_PROVIDER_SITE_OTHER): Payer: PPO | Admitting: Internal Medicine

## 2015-04-22 ENCOUNTER — Encounter: Payer: Self-pay | Admitting: Internal Medicine

## 2015-04-22 VITALS — BP 118/68 | HR 68 | Temp 97.8°F | Ht 66.0 in | Wt 177.1 lb

## 2015-04-22 DIAGNOSIS — I10 Essential (primary) hypertension: Secondary | ICD-10-CM

## 2015-04-22 DIAGNOSIS — Z0000 Encounter for general adult medical examination without abnormal findings: Secondary | ICD-10-CM | POA: Diagnosis not present

## 2015-04-22 DIAGNOSIS — E119 Type 2 diabetes mellitus without complications: Secondary | ICD-10-CM | POA: Diagnosis not present

## 2015-04-22 DIAGNOSIS — E785 Hyperlipidemia, unspecified: Secondary | ICD-10-CM

## 2015-04-22 DIAGNOSIS — Z09 Encounter for follow-up examination after completed treatment for conditions other than malignant neoplasm: Secondary | ICD-10-CM

## 2015-04-22 LAB — HEMOGLOBIN A1C: Hgb A1c MFr Bld: 6.9 % — ABNORMAL HIGH (ref 4.6–6.5)

## 2015-04-22 LAB — LIPID PANEL
CHOL/HDL RATIO: 4
Cholesterol: 140 mg/dL (ref 0–200)
HDL: 38 mg/dL — AB (ref >39.00)
LDL Cholesterol: 78 mg/dL (ref 0–99)
NonHDL: 102.25
TRIGLYCERIDES: 123 mg/dL (ref 0.0–149.0)
VLDL: 24.6 mg/dL (ref 0.0–40.0)

## 2015-04-22 LAB — COMPREHENSIVE METABOLIC PANEL
ALT: 22 U/L (ref 0–53)
AST: 34 U/L (ref 0–37)
Albumin: 4.2 g/dL (ref 3.5–5.2)
Alkaline Phosphatase: 67 U/L (ref 39–117)
BILIRUBIN TOTAL: 0.8 mg/dL (ref 0.2–1.2)
BUN: 34 mg/dL — ABNORMAL HIGH (ref 6–23)
CALCIUM: 9.8 mg/dL (ref 8.4–10.5)
CHLORIDE: 104 meq/L (ref 96–112)
CO2: 31 meq/L (ref 19–32)
Creatinine, Ser: 1.37 mg/dL (ref 0.40–1.50)
GFR: 52.36 mL/min — AB (ref >60.00)
GLUCOSE: 146 mg/dL — AB (ref 70–99)
POTASSIUM: 4.3 meq/L (ref 3.5–5.1)
Sodium: 142 mEq/L (ref 135–145)
Total Protein: 7.1 g/dL (ref 6.0–8.3)

## 2015-04-22 LAB — CBC WITH DIFFERENTIAL/PLATELET
BASOS ABS: 0 10*3/uL (ref 0.0–0.1)
BASOS PCT: 0.5 % (ref 0.0–3.0)
EOS ABS: 0.1 10*3/uL (ref 0.0–0.7)
Eosinophils Relative: 1.9 % (ref 0.0–5.0)
HEMATOCRIT: 38.3 % — AB (ref 39.0–52.0)
HEMOGLOBIN: 12.9 g/dL — AB (ref 13.0–17.0)
LYMPHS PCT: 17.7 % (ref 12.0–46.0)
Lymphs Abs: 1.4 10*3/uL (ref 0.7–4.0)
MCHC: 33.7 g/dL (ref 30.0–36.0)
MCV: 98.6 fl (ref 78.0–100.0)
MONOS PCT: 2.9 % — AB (ref 3.0–12.0)
Monocytes Absolute: 0.2 10*3/uL (ref 0.1–1.0)
NEUTROS ABS: 6 10*3/uL (ref 1.4–7.7)
Neutrophils Relative %: 77 % (ref 43.0–77.0)
PLATELETS: 199 10*3/uL (ref 150.0–400.0)
RBC: 3.89 Mil/uL — ABNORMAL LOW (ref 4.22–5.81)
RDW: 14.1 % (ref 11.5–15.5)
WBC: 7.8 10*3/uL (ref 4.0–10.5)

## 2015-04-22 LAB — MICROALBUMIN / CREATININE URINE RATIO
CREATININE, U: 30 mg/dL
MICROALB UR: 0.7 mg/dL (ref 0.0–1.9)
Microalb Creat Ratio: 2.3 mg/g (ref 0.0–30.0)

## 2015-04-22 NOTE — Progress Notes (Signed)
Subjective:    Patient ID: Derrick Fry, male    DOB: 07/01/1927, 82 y.o.   MRN: [id number]  DOS:  04/22/2015 Type of visit - description : CPX   Here for Medicare AWV:   1. Risk factors based on Past M, S, F history: reviewed   2. Physical Activities: not very active other than home-yard chores, limited due to DJD 3. Depression/mood: Neg screening    4. Hearing: Corrected w/ hearing aids which uses inconsistently   5. ADL's: Independent, still drives   6. Fall Risk: no recent falls, prevention discussed   7. home Safety: does feel safe at home   8. Height, weight, &visual acuity: see VS, vision stable, sees eye doctor regulalrly   9. Counseling: provided   10. Labs ordered based on risk factors: if needed   11. Referral Coordination: if needed   12. Care Plan, see assessment and plan   13. Cognitive Assessment: motor skill and cognition appropriate for age.   14. Care team updated 15. Written plan provided 16. Discussed a HC-POA  In addition, today we discussed the following: DM: Rarely check CBGs, last cbg-- 127. Had his eyes checked at local ophthalmologist and at the [place]. High cholesterol, good compliance W/ Lipitor, no apparent side effects HTN: Good compliance with Lasix, when checked, systolic BP in the [id number] MSK: Sees rheumatology regularly. Good compliance with medications.    Review of Systems Constitutional: No fever. No chills. No unexplained wt changes. No unusual sweats  HEENT: No dental problems, no ear discharge, no facial swelling, no voice changes. No eye discharge, no eye  redness , no  intolerance to light   Respiratory: No wheezing , no  difficulty breathing. No cough , no mucus production  Cardiovascular: No CP, no palpitations. Occasionally ankle swelling at night  GI: no nausea, no vomiting, no diarrhea , no  abdominal pain.  No blood in the stools. No dysphagia, no odynophagia    Endocrine: No polyphagia, no polyuria , no polydipsia  GU: No  dysuria, gross hematuria, difficulty urinating. No urinary urgency, no frequency.  Musculoskeletal: No joint swellings or unusual aches or pains  Skin: No change in the color of the skin, palor , no  Rash  Allergic, immunologic: No environmental allergies , no  food allergies  Neurological: No dizziness no  syncope. No headaches. No diplopia, no slurred, no slurred speech, no motor deficits, no facial  Numbness  Hematological: No enlarged lymph nodes, no easy bruising , no unusual bleedings  Psychiatry: No suicidal ideas, no hallucinations, no beavior problems, no confusion.  No unusual/severe anxiety, no depression   Past Medical History  Diagnosis Date  . CAD (coronary artery disease)     MI 08-04-85  . Hyperlipidemia   . Peripheral neuropathy ([place])   . Hypertension   . Renal insufficiency     chronic w/ solitary kidney, congenital  . Depression   . Osteoarthritis   . Gout     diskitis 10/2008, Dr Ouida Sills  . Gait abnormality     chronic imbalance  . AAA (abdominal aortic aneurysm) ([place])   . Pneumonia     had right pneumonia pleurisy requiring resection of ribs and chest tube drainage at age 22  . Complication of anesthesia   . Small bowel obstruction ([place]) 04/01/2012  . OSA (obstructive sleep apnea)     Limited CPAP tolerance  . Diabetes mellitus with neuropathy [facility])     Past Surgical History  Procedure Laterality Date  .  Inguinal hernia repair  03/07/83  . Coronary artery bypass graft  01/04/86  . Total knee arthroplasty  05/04/89    left  . Cataract extraction  09/1999,04/2003    rt,left  . Knuckles replaced  05/2005    left hand  . 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 Abdominal aortic aneurysm repair    . Nephrectomy  1996  . US echocardiography  01/14/2007    EF 55-60%  . Cardiovascular stress test  10/13/2009    EF 57%    Social History   Social History  . Marital Status: Married    Spouse Name: N/A  .  Number of Children: 2  . Years of Education: N/A   Occupational History  . retired    Social History Main Topics  . Smoking status: Former Smoker    Quit date: 04/02/1972  . Smokeless tobacco: Never Used  . Alcohol Use: No     Comment: former heavy alcohol use  . Drug Use: No  . Sexual Activity: Not on file   Other Topics Concern  . Not on file   Social History Narrative   Lost a son    Lives at home with wife    Still drives      Family History  Problem Relation Age of Onset  . Lymphoma Sister   . Colon cancer Neg Hx   . Prostate cancer Neg Hx        Medication List       This list is accurate as of: 04/22/15  4:46 PM.  Always use your most recent med list.               aspirin 81 MG tablet  Take 81 mg by mouth daily.     atorvastatin 80 MG tablet  Commonly known as:  LIPITOR  Take 1 tablet (80 mg total) by mouth daily.     CENTRUM SILVER tablet  Take 1 tablet by mouth daily.     cyanocobalamin 100 MCG tablet  Take 100 mcg by mouth daily.     fish oil-omega-3 fatty acids 1000 MG capsule  Take 2 g by mouth daily.     Flaxseed Oil 1000 MG Caps  Take 1,000 mg by mouth 2 (two) times daily.     folic acid 1 MG tablet  Commonly known as:  FOLVITE  Take 1 mg by mouth daily.     furosemide 40 MG tablet  Commonly known as:  LASIX  Take 1 tablet (40 mg total) by mouth daily.     gabapentin 400 MG capsule  Commonly known as:  NEURONTIN  Take 2 capsules (800 mg total) by mouth 3 (three) times daily.     glucosamine-chondroitin 500-400 MG tablet  Take 1 tablet by mouth 3 (three) times daily.     glucose blood test strip  Commonly known as:  ONE TOUCH ULTRA TEST  Check blood sugar no more than twice daily.     methotrexate 2.5 MG tablet  Commonly known as:  RHEUMATREX  Take 2.5 mg by mouth once a week. Caution:Chemotherapy. Protect from light.     niacin 500 MG tablet  Take 500 mg by mouth at bedtime.     onetouch ultrasoft lancets  Check  blood sugar no more than twice daily.     oxyCODONE-acetaminophen 7.5-325 MG tablet  Commonly known as:  PERCOCET  Take 1 tablet by mouth 2 (  two) times daily as needed.     polyethylene glycol packet  Commonly known as:  MIRALAX / GLYCOLAX  Take 17 g by mouth as needed for mild constipation or moderate constipation.     predniSONE 5 MG tablet  Commonly known as:  DELTASONE  Take 2.5 mg by mouth daily.     ULORIC 40 MG tablet  Generic drug:  febuxostat  Take 40 mg by mouth daily.           Objective:   Physical Exam   BP 118/68 mmHg  Pulse 68  Temp(Src) 97.8 F (36.6 C) (Oral)  Ht 5\' 6"  (1.676 m)  Wt 177 lb 2 oz (80.343 kg)  BMI 28.60 kg/m2  SpO2 98% General:   Well developed, well nourished . NAD.  HEENT:  Normocephalic . Face symmetric, atraumatic. Neck: No thyromegaly. Normal carotid pulses. Lungs:  Dry crackles at bases otherwise clear Normal respiratory effort, no intercostal retractions, no accessory muscle use. Heart: RRR,  no murmur.  Trace ANKLE edema bilaterally  Left calf larger than right, chronic, likely right calf atrophic Abdomen:  Not distended, soft, non-tender. No bruit-mass Skin: Not pale. Not jaundice Neurologic:  alert & oriented X3.  Speech normal, gait appropriate for age and unassisted Psych--  Cognition and judgment appear intact.  Cooperative with normal attention span and concentration.  Behavior appropriate. No anxious or depressed appearing.    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 HTN: Well-controlled on Lasix. No change Hyperlipidemia: On Lipitor, check labs Chronic renal insufficiency: Labs CAD: Continue aspirin, Lipitor, not on beta blockers but asymptomatic Suspected pulmonary fibrosis by physical exam and CT findings. Essentially asx, never seen pulmonary. rx  observation MSK: Follow-up by rheumatology RTC 6 months

## 2015-04-22 NOTE — Progress Notes (Signed)
Pre visit review using our clinic review tool, if applicable. No additional management support is needed unless otherwise documented below in the visit note. 

## 2015-04-22 NOTE — Assessment & Plan Note (Addendum)
Td 2010 ; pneumonia shot 2007 ;prevnar-- 2015 [facility]  Had a flu shot    No further prostate or colon ca screening   Bone Density: 08/14/13-- al [place] ---normal Doing very well, diet, exercise and fall prevention discussed.

## 2015-04-22 NOTE — Patient Instructions (Signed)
Get your blood work before you leave     Please consider visit these websites for more information:  www.begintheconversation.org  theconversationproject.org   Next visit 6 months        Fall Prevention and Home Safety Falls cause injuries and can affect all age groups. It is possible to use preventive measures to significantly decrease the likelihood of falls. There are many simple measures which can make your home safer and prevent falls. OUTDOORS  Repair cracks and edges of walkways and driveways.  Remove high doorway thresholds.  Trim shrubbery on the main path into your home.  Have good outside lighting.  Clear walkways of tools, rocks, debris, and clutter.  Check that handrails are not broken and are securely fastened. Both sides of steps should have handrails.  Have leaves, snow, and ice cleared regularly.  Use sand or salt on walkways during winter months.  In the garage, clean up grease or oil spills. BATHROOM  Install night lights.  Install grab bars by the toilet and in the tub and shower.  Use non-skid mats or decals in the tub or shower.  Place a plastic non-slip stool in the shower to sit on, if needed.  Keep floors dry and clean up all water on the floor immediately.  Remove soap buildup in the tub or shower on a regular basis.  Secure bath mats with non-slip, double-sided rug tape.  Remove throw rugs and tripping hazards from the floors. BEDROOMS  Install night lights.  Make sure a bedside light is easy to reach.  Do not use oversized bedding.  Keep a telephone by your bedside.  Have a firm chair with side arms to use for getting dressed.  Remove throw rugs and tripping hazards from the floor. KITCHEN  Keep handles on pots and pans turned toward the center of the stove. Use back burners when possible.  Clean up spills quickly and allow time for drying.  Avoid walking on wet floors.  Avoid hot utensils and knives.  Position  shelves so they are not too high or low.  Place commonly used objects within easy reach.  If necessary, use a sturdy step stool with a grab bar when reaching.  Keep electrical cables out of the way.  Do not use floor polish or wax that makes floors slippery. If you must use wax, use non-skid floor wax.  Remove throw rugs and tripping hazards from the floor. STAIRWAYS  Never leave objects on stairs.  Place handrails on both sides of stairways and use them. Fix any loose handrails. Make sure handrails on both sides of the stairways are as long as the stairs.  Check carpeting to make sure it is firmly attached along stairs. Make repairs to worn or loose carpet promptly.  Avoid placing throw rugs at the top or bottom of stairways, or properly secure the rug with carpet tape to prevent slippage. Get rid of throw rugs, if possible.  Have an electrician put in a light switch at the top and bottom of the stairs. OTHER FALL PREVENTION TIPS  Wear low-heel or rubber-soled shoes that are supportive and fit well. Wear closed toe shoes.  When using a stepladder, make sure it is fully opened and both spreaders are firmly locked. Do not climb a closed stepladder.  Add color or contrast paint or tape to grab bars and handrails in your home. Place contrasting color strips on first and last steps.  Learn and use mobility aids as needed. Install an Dealer  emergency response system.  Turn on lights to avoid dark areas. Replace light bulbs that burn out immediately. Get light switches that glow.  Arrange furniture to create clear pathways. Keep furniture in the same place.  Firmly attach carpet with non-skid or double-sided tape.  Eliminate uneven floor surfaces.  Select a carpet pattern that does not visually hide the edge of steps.  Be aware of all pets. OTHER HOME SAFETY TIPS  Set the water temperature for 120 F (48.8 C).  Keep emergency numbers on or near the telephone.  Keep  smoke detectors on every level of the home and near sleeping areas. Document Released: 02/10/2002 Document Revised: 08/22/2011 Document Reviewed: 05/12/2011 [facility] Patient Information 2015 [place], [place]. This information is not intended to replace advice given to you by your health care provider. Make sure you discuss any questions you have with your health care provider.   Preventive Care for Adults Ages 80 and over  Blood pressure check.** / Every 1 to 2 years.  Lipid and cholesterol check.**/ Every 5 years beginning at age 19.  Lung cancer screening. / Every year if you are aged 80-80 years and have a 30-pack-year history of smoking and currently smoke or have quit within the past 15 years. Yearly screening is stopped once you have quit smoking for at least 15 years or develop a health problem that would prevent you from having lung cancer treatment.  Fecal occult blood test (FOBT) of stool. / Every year beginning at age 46 and continuing until age 42. You may not have to do this test if you get a colonoscopy every 10 years.  Flexible sigmoidoscopy** or colonoscopy.** / Every 5 years for a flexible sigmoidoscopy or every 10 years for a colonoscopy beginning at age 51 and continuing until age 50.  Hepatitis C blood test.** / For all people born from [birth year] through 1963 and any individual with known risks for hepatitis C.  Abdominal aortic aneurysm (AAA) screening.** / A one-time screening for ages 15 to 28 years who are current or former smokers.  Skin self-exam. / Monthly.  Influenza vaccine. / Every year.  Tetanus, diphtheria, and acellular pertussis (Tdap/Td) vaccine.** / 1 dose of Td every 10 years.  Varicella vaccine.** / Consult your health care provider.  Zoster vaccine.** / 1 dose for adults aged 24 years or older.  Pneumococcal 13-valent conjugate (PCV13) vaccine.** / Consult your health care provider.  Pneumococcal polysaccharide (PPSV23) vaccine.** / 1 dose for all  adults aged 80 years and older.  Meningococcal vaccine.** / Consult your health care provider.  Hepatitis A vaccine.** / Consult your health care provider.  Hepatitis B vaccine.** / Consult your health care provider.  Haemophilus influenzae type b (Hib) vaccine.** / Consult your health care provider. **Family history and personal history of risk and conditions may change your health care provider's recommendations. Document Released: 04/18/2001 Document Revised: 02/25/2013 Document Reviewed: 07/18/2010 [facility] Patient Information 2015 [place], [place]. This information is not intended to replace advice given to you by your health care provider. Make sure you discuss any questions you have with your health care provider.

## 2015-04-22 NOTE — Assessment & Plan Note (Signed)
Diabetes with neuropathy- CRI, continue gabapentin, on diet control, check A1c and microalbumin. HTN: Well-controlled on Lasix. No change Hyperlipidemia: On Lipitor, check labs Chronic renal insufficiency: Labs CAD: Continue aspirin, Lipitor, not on beta blockers but asymptomatic Suspected pulmonary fibrosis by physical exam and CT findings. Essentially asx, never seen pulmonary. rx  observation MSK: Follow-up by rheumatology RTC 6 months

## 2015-05-10 DIAGNOSIS — M150 Primary generalized (osteo)arthritis: Secondary | ICD-10-CM | POA: Diagnosis not present

## 2015-05-10 DIAGNOSIS — L4059 Other psoriatic arthropathy: Secondary | ICD-10-CM | POA: Diagnosis not present

## 2015-05-10 DIAGNOSIS — M1A09X1 Idiopathic chronic gout, multiple sites, with tophus (tophi): Secondary | ICD-10-CM | POA: Diagnosis not present

## 2015-05-10 DIAGNOSIS — M5136 Other intervertebral disc degeneration, lumbar region: Secondary | ICD-10-CM | POA: Diagnosis not present

## 2015-05-10 DIAGNOSIS — M2550 Pain in unspecified joint: Secondary | ICD-10-CM | POA: Diagnosis not present

## 2015-05-10 DIAGNOSIS — Z79899 Other long term (current) drug therapy: Secondary | ICD-10-CM | POA: Diagnosis not present

## 2015-05-27 ENCOUNTER — Telehealth: Payer: Self-pay | Admitting: Internal Medicine

## 2015-05-27 MED ORDER — OXYCODONE-ACETAMINOPHEN 7.5-325 MG PO TABS: 1.0000 | ORAL_TABLET | Freq: Two times a day (BID) | ORAL | Status: DC | PRN

## 2015-05-27 NOTE — Telephone Encounter (Signed)
Rx for March and April 2017 printed, awaiting MD signature.

## 2015-05-27 NOTE — Telephone Encounter (Signed)
Pt called for refill on oxycodone. He has enough thru Saturday. Takes 2/day. Please call when RX ready to pick up at [phone].

## 2015-05-27 NOTE — Telephone Encounter (Signed)
Please inform Pt that Rx has been placed at front desk for pick up at his convenience. Thank you.  

## 2015-05-27 NOTE — Telephone Encounter (Signed)
#  60, 2 prescriptions

## 2015-05-27 NOTE — Telephone Encounter (Signed)
Pt is requesting refill on Oxycodone.  Last OV: 04/22/2015 Last Fill: 03/30/2015 #60 and 0RF UDS: 09/28/2014 Low risk  Please advise.

## 2015-05-27 NOTE — Telephone Encounter (Signed)
Notified pt. 

## 2015-06-02 DIAGNOSIS — M47816 Spondylosis without myelopathy or radiculopathy, lumbar region: Secondary | ICD-10-CM | POA: Diagnosis not present

## 2015-06-02 DIAGNOSIS — M545 Low back pain: Secondary | ICD-10-CM | POA: Diagnosis not present

## 2015-07-13 DIAGNOSIS — L821 Other seborrheic keratosis: Secondary | ICD-10-CM | POA: Diagnosis not present

## 2015-07-13 DIAGNOSIS — L570 Actinic keratosis: Secondary | ICD-10-CM | POA: Diagnosis not present

## 2015-07-13 DIAGNOSIS — Z85828 Personal history of other malignant neoplasm of skin: Secondary | ICD-10-CM | POA: Diagnosis not present

## 2015-07-13 DIAGNOSIS — L812 Freckles: Secondary | ICD-10-CM | POA: Diagnosis not present

## 2015-07-13 DIAGNOSIS — L905 Scar conditions and fibrosis of skin: Secondary | ICD-10-CM | POA: Diagnosis not present

## 2015-07-13 DIAGNOSIS — D1801 Hemangioma of skin and subcutaneous tissue: Secondary | ICD-10-CM | POA: Diagnosis not present

## 2015-07-19 DIAGNOSIS — M1A09X1 Idiopathic chronic gout, multiple sites, with tophus (tophi): Secondary | ICD-10-CM | POA: Diagnosis not present

## 2015-07-19 DIAGNOSIS — M150 Primary generalized (osteo)arthritis: Secondary | ICD-10-CM | POA: Diagnosis not present

## 2015-07-19 DIAGNOSIS — L4059 Other psoriatic arthropathy: Secondary | ICD-10-CM | POA: Diagnosis not present

## 2015-07-19 DIAGNOSIS — M5136 Other intervertebral disc degeneration, lumbar region: Secondary | ICD-10-CM | POA: Diagnosis not present

## 2015-07-19 DIAGNOSIS — M2550 Pain in unspecified joint: Secondary | ICD-10-CM | POA: Diagnosis not present

## 2015-07-19 DIAGNOSIS — Z79899 Other long term (current) drug therapy: Secondary | ICD-10-CM | POA: Diagnosis not present

## 2015-07-19 DIAGNOSIS — N183 Chronic kidney disease, stage 3 (moderate): Secondary | ICD-10-CM | POA: Diagnosis not present

## 2015-07-26 ENCOUNTER — Telehealth: Payer: Self-pay | Admitting: Internal Medicine

## 2015-07-26 MED ORDER — OXYCODONE-ACETAMINOPHEN 7.5-325 MG PO TABS: 1.0000 | ORAL_TABLET | Freq: Two times a day (BID) | ORAL | Status: DC | PRN

## 2015-07-26 NOTE — Telephone Encounter (Signed)
Rx's for May and June 2017 printed, awaiting MD signature.

## 2015-07-26 NOTE — Telephone Encounter (Signed)
Pt is requesting refill on Oxycodone.  Last OV: 04/22/2015  Last Fill: 05/27/2015 #60 and 0RF For March and April 2017 UDS: 09/28/2014 Low risk  Please advise.

## 2015-07-26 NOTE — Telephone Encounter (Signed)
Ok 60, 2 prescriptions

## 2015-07-26 NOTE — Telephone Encounter (Signed)
Refill request for oxyCODONE  CB: [id number]

## 2015-07-27 NOTE — Telephone Encounter (Signed)
Please inform Pt that Rx's have been placed up front for pick up at his convenience. Thank you.

## 2015-07-27 NOTE — Telephone Encounter (Signed)
Called pt. He expressed understanding. He will be in to pick up from front desk.

## 2015-07-28 ENCOUNTER — Other Ambulatory Visit: Payer: PPO

## 2015-08-03 ENCOUNTER — Other Ambulatory Visit (INDEPENDENT_AMBULATORY_CARE_PROVIDER_SITE_OTHER): Payer: PPO | Admitting: *Deleted

## 2015-08-03 DIAGNOSIS — I2510 Atherosclerotic heart disease of native coronary artery without angina pectoris: Secondary | ICD-10-CM | POA: Diagnosis not present

## 2015-08-03 DIAGNOSIS — E785 Hyperlipidemia, unspecified: Secondary | ICD-10-CM | POA: Diagnosis not present

## 2015-08-03 LAB — BASIC METABOLIC PANEL
BUN: 33 mg/dL — AB (ref 7–25)
CHLORIDE: 103 mmol/L (ref 98–110)
CO2: 27 mmol/L (ref 20–31)
Calcium: 10.4 mg/dL — ABNORMAL HIGH (ref 8.6–10.3)
Creat: 1.47 mg/dL — ABNORMAL HIGH (ref 0.70–1.11)
Glucose, Bld: 128 mg/dL — ABNORMAL HIGH (ref 65–99)
POTASSIUM: 3.9 mmol/L (ref 3.5–5.3)
Sodium: 141 mmol/L (ref 135–146)

## 2015-08-03 LAB — HEPATIC FUNCTION PANEL
ALK PHOS: 70 U/L (ref 40–115)
ALT: 18 U/L (ref 9–46)
AST: 31 U/L (ref 10–35)
Albumin: 4 g/dL (ref 3.6–5.1)
BILIRUBIN DIRECT: 0.2 mg/dL (ref <=0.2)
BILIRUBIN INDIRECT: 0.5 mg/dL (ref 0.2–1.2)
TOTAL PROTEIN: 6.4 g/dL (ref 6.1–8.1)
Total Bilirubin: 0.7 mg/dL (ref 0.2–1.2)

## 2015-08-03 LAB — LIPID PANEL
CHOLESTEROL: 139 mg/dL (ref 125–200)
HDL: 38 mg/dL — AB (ref >=40)
LDL CALC: 81 mg/dL (ref <130)
TRIGLYCERIDES: 102 mg/dL (ref <150)
Total CHOL/HDL Ratio: 3.7 Ratio (ref <=5.0)
VLDL: 20 mg/dL (ref <30)

## 2015-08-03 NOTE — Addendum Note (Signed)
Addended by: Eulis Foster on: 08/03/2015 08:34 AM   Modules accepted: Orders

## 2015-08-03 NOTE — Addendum Note (Signed)
Addended by: Eulis Foster on: 08/03/2015 08:33 AM   Modules accepted: Orders

## 2015-08-05 ENCOUNTER — Encounter: Payer: Self-pay | Admitting: Cardiovascular Disease

## 2015-08-05 ENCOUNTER — Ambulatory Visit (INDEPENDENT_AMBULATORY_CARE_PROVIDER_SITE_OTHER): Payer: PPO | Admitting: Cardiovascular Disease

## 2015-08-05 VITALS — BP 144/78 | HR 71 | Ht 66.0 in | Wt 175.8 lb

## 2015-08-05 DIAGNOSIS — E785 Hyperlipidemia, unspecified: Secondary | ICD-10-CM

## 2015-08-05 DIAGNOSIS — I10 Essential (primary) hypertension: Secondary | ICD-10-CM | POA: Diagnosis not present

## 2015-08-05 DIAGNOSIS — I2510 Atherosclerotic heart disease of native coronary artery without angina pectoris: Secondary | ICD-10-CM

## 2015-08-05 MED ORDER — NITROGLYCERIN 0.4 MG SL SUBL: 0.4000 mg | SUBLINGUAL_TABLET | SUBLINGUAL | Status: DC | PRN

## 2015-08-05 NOTE — Progress Notes (Signed)
Derrick Fry Date of Birth  12-06-1927 [place] [address]    [place], [place]  [address]    [place], [place]  [address] [phone]  Fax  [phone]  [phone]  Fax [phone]  Problem List: 1. CAD - s/p CABG ( 1987, [place]) 2.  AAA - repair 3. Hypertension 4. Hyperlipidemia    History of Present Illness:  Derrick Fry is an 82 y.o. gentleman with the above noted hx.  He does not exercise much because of his arthritis.  He is active in the summertime.  August 29, 2012:  Derrick Fry is doing well.  He did not plant a garden this year. He stays active doing yard work.  Lipids are check at his medical doctors office.  August 27, 2013:  He takes his BP regularly,  It is usually well controlled.  No CP.  Able to do most of his normal activities.   A bit weaker - has some arthritis in his back.   Also has some hip pain.  September 01, 2014:   Derrick Fry is doing well.   No angina.  No dyspnea.  No PND or orthopnea No CP  He had CABG 29 years ago .   Wants to consider right knee replacement.     August 05, 2015:  Has done well from cardiac standpoint .  Has felt slightly nauseated.   Current Outpatient Prescriptions on File Prior to Visit  Medication Sig Dispense Refill  . aspirin 81 MG tablet Take 81 mg by mouth daily.     Marland Kitchen atorvastatin (LIPITOR) 80 MG tablet Take 1 tablet (80 mg total) by mouth daily. 30 tablet 8  . cyanocobalamin 100 MCG tablet Take 100 mcg by mouth daily.     . febuxostat (ULORIC) 40 MG tablet Take 40 mg by mouth daily.     . fish oil-omega-3 fatty acids 1000 MG capsule Take 2 g by mouth daily.     . Flaxseed, Linseed, (FLAXSEED OIL) 1000 MG CAPS Take 1,000 mg by mouth 2 (two) times daily.     . folic acid (FOLVITE) 1 MG tablet Take 1 mg by mouth daily.    . furosemide (LASIX) 40 MG tablet Take 1 tablet (40 mg total) by mouth daily. 30 tablet 8  . gabapentin (NEURONTIN) 400 MG capsule Take 2 capsules (800  mg total) by mouth 3 (three) times daily. 180 capsule 6  . glucosamine-chondroitin 500-400 MG tablet Take 1 tablet by mouth 3 (three) times daily.     . methotrexate (RHEUMATREX) 2.5 MG tablet Take 2.5 mg by mouth once a week. Caution:Chemotherapy. Protect from light.    . Multiple Vitamins-Minerals (CENTRUM SILVER) tablet Take 1 tablet by mouth daily.     . niacin 500 MG tablet Take 500 mg by mouth at bedtime.     Marland Kitchen oxyCODONE-acetaminophen (PERCOCET) 7.5-325 MG tablet Take 1 tablet by mouth 2 (two) times daily as needed. 60 tablet 0  . oxyCODONE-acetaminophen (PERCOCET) 7.5-325 MG tablet Take 1 tablet by mouth 2 (two) times daily as needed. 60 tablet 0  . polyethylene glycol (MIRALAX / GLYCOLAX) packet Take 17 g by mouth as needed for mild constipation or moderate constipation.     . predniSONE (DELTASONE) 5 MG tablet Take 2.5 mg by mouth daily.     Marland Kitchen glucose blood (ONE TOUCH ULTRA TEST) test strip Check blood sugar no more than twice daily. (Patient  not taking: Reported on 04/22/2015) 100 each 11  . Lancets (ONETOUCH ULTRASOFT) lancets Check blood sugar no more than twice daily. (Patient not taking: Reported on 04/22/2015) 100 each 11   No current facility-administered medications on file prior to visit.    Allergies  Allergen Reactions  . Colchicine     Unknown     Past Medical History  Diagnosis Date  . CAD (coronary artery disease)     MI 08-04-85  . Hyperlipidemia   . Peripheral neuropathy ([place])   . Hypertension   . Renal insufficiency     chronic w/ solitary kidney, congenital  . Depression   . Osteoarthritis   . Gout     diskitis 10/2008, Dr Ouida Sills  . Gait abnormality     chronic imbalance  . AAA (abdominal aortic aneurysm) ([place])   . Pneumonia     had right pneumonia pleurisy requiring resection of ribs and chest tube drainage at age 43  . Complication of anesthesia   . Small bowel obstruction ([place]) 04/01/2012  . OSA (obstructive sleep apnea)     Limited CPAP tolerance    . Diabetes mellitus with neuropathy [facility])     Past Surgical History  Procedure Laterality Date  . Inguinal hernia repair  03/07/83  . Coronary artery bypass graft  01/04/86  . Total knee arthroplasty  05/04/89    left  . Cataract extraction  09/1999,04/2003    rt,left  . Knuckles replaced  05/2005    left hand  . 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 Abdominal aortic aneurysm repair    . Nephrectomy  1996  . US echocardiography  01/14/2007    EF 55-60%  . Cardiovascular stress test  10/13/2009    EF 57%    History  Smoking status  . Former Smoker  . Quit date: 04/02/1972  Smokeless tobacco  . Never Used    History  Alcohol Use No    Comment: former heavy alcohol use    Family History  Problem Relation Age of Onset  . Lymphoma Sister   . Colon cancer Neg Hx   . Prostate cancer Neg Hx     Reviw of Systems:  Reviewed in the HPI.  All other systems are negative.  Physical Exam: Blood pressure 144/78, pulse 71, height 5\' 6"  (1.676 m), weight 175 lb 12.8 oz (79.742 kg). General: Well developed, well nourished, in no acute distress.  Head: Normocephalic, atraumatic, sclera non-icteric, mucus membranes are moist,   Neck: Supple. Negative for carotid bruits. JVD not elevated.  Lungs: Clear bilaterally to auscultation without wheezes, rales, or rhonchi. Breathing is unlabored.  Heart: RRR with S1 S2.  There is a soft systolic murmur.    Abdomen: Soft, non-tender, non-distended with normoactive bowel sounds. No hepatomegaly. No rebound/guarding. Moderate sized ventral hernia .  Msk:  Strength and tone appear normal for age.  Extremities: No clubbing or cyanosis. Trace  edema.  Distal pedal pulses are 2+ and equal bilaterally.  Neuro: Alert and oriented X 3. Moves all extremities spontaneously.  Psych:  Responds to questions appropriately with a normal affect.  ECG: August 05, 2015:  NSR at 73.   LVH Assessment / Plan:    1. CAD - s/p CABG ( 1987, [place])- doing well.  No angina .  Continue current meds.    2.  AAA - repair - stable. Complicated by a moderate sized ventral hernia  3. Hypertension - BP is stable at home.   At bit elevated here today.   4. Hyperlipidemia - labs look great     Mertie Moores, MD  08/05/2015 2:11 PM    [facility] [place],  [place], [place]  [address] Pager [phone] Phone: [phone]; Fax: [phone]   [facility]  [address] [place], [place]  [address] [phone]    Fax [phone]

## 2015-08-05 NOTE — Patient Instructions (Signed)
Medication Instructions:  Your physician recommends that you continue on your current medications as directed. Please refer to the Current Medication list given to you today. A refill of your Nitroglycerin has been sent to [facility]    Labwork: Your physician recommends that you return for lab work in: 1 year on the day of or a few days before your office visit with Dr. Acie Fredrickson.  You will need to FAST for this appointment - nothing to eat or drink after midnight the night before except water.   Testing/Procedures: None Ordered   Follow-Up: Your physician wants you to follow-up in: 1 year with Dr. Acie Fredrickson.  You will receive a reminder letter in the mail two months in advance. If you don't receive a letter, please call our office to schedule the follow-up appointment.   If you need a refill on your cardiac medications before your next appointment, please call your pharmacy.   Thank you for choosing [facility]! Christen Bame, RN [phone]

## 2015-08-09 ENCOUNTER — Other Ambulatory Visit: Payer: Self-pay | Admitting: Internal Medicine

## 2015-08-22 ENCOUNTER — Other Ambulatory Visit: Payer: Self-pay | Admitting: Internal Medicine

## 2015-09-13 ENCOUNTER — Other Ambulatory Visit: Payer: Self-pay | Admitting: Internal Medicine

## 2015-09-22 ENCOUNTER — Telehealth: Payer: Self-pay | Admitting: Internal Medicine

## 2015-09-22 ENCOUNTER — Encounter: Payer: Self-pay | Admitting: Internal Medicine

## 2015-09-22 DIAGNOSIS — Z79891 Long term (current) use of opiate analgesic: Secondary | ICD-10-CM | POA: Diagnosis not present

## 2015-09-22 MED ORDER — OXYCODONE-ACETAMINOPHEN 7.5-325 MG PO TABS: 1.0000 | ORAL_TABLET | Freq: Two times a day (BID) | ORAL | Status: DC | PRN

## 2015-09-22 NOTE — Telephone Encounter (Signed)
rx printed and given to [facility]

## 2015-09-22 NOTE — Telephone Encounter (Signed)
Pt is requesting refill on Oxycodone. Dr. Ethel Rana Pt.  Last OV: 04/22/2015 Last Fill: 07/26/2015 #60 and 0RF For May and June 2017 UDS: 09/28/2014 Low risk   Please advise.

## 2015-09-22 NOTE — Telephone Encounter (Signed)
°  Relationship to patient: Self Can be reached: [phone]    Reason for call: Request refill on oxyCODONE-acetaminophen (PERCOCET) 7.5-325 MG tablet [id number]

## 2015-09-22 NOTE — Telephone Encounter (Signed)
Spoke w/ Wells Guiles, Pt's wife, informed her that Rx is ready for pick up. Also informed her to let Pt know that only 1 Rx was given since Dr. Larose Kells is out of the office. Wells Guiles verbalized understanding.

## 2015-09-27 DIAGNOSIS — M47816 Spondylosis without myelopathy or radiculopathy, lumbar region: Secondary | ICD-10-CM | POA: Diagnosis not present

## 2015-10-05 ENCOUNTER — Telehealth: Payer: Self-pay

## 2015-10-05 NOTE — Telephone Encounter (Signed)
UDS: 09/22/2015  Positive for Oxycodone    Low risk per Dr. Larose Kells 10/04/2015

## 2015-10-18 DIAGNOSIS — M47816 Spondylosis without myelopathy or radiculopathy, lumbar region: Secondary | ICD-10-CM | POA: Diagnosis not present

## 2015-10-18 DIAGNOSIS — G8929 Other chronic pain: Secondary | ICD-10-CM | POA: Diagnosis not present

## 2015-10-18 DIAGNOSIS — M545 Low back pain: Secondary | ICD-10-CM | POA: Diagnosis not present

## 2015-10-21 ENCOUNTER — Encounter: Payer: Self-pay | Admitting: Internal Medicine

## 2015-10-21 ENCOUNTER — Ambulatory Visit (INDEPENDENT_AMBULATORY_CARE_PROVIDER_SITE_OTHER): Payer: PPO | Admitting: Internal Medicine

## 2015-10-21 VITALS — BP 132/60 | HR 66 | Temp 97.8°F | Resp 14 | Ht 66.0 in | Wt 162.2 lb

## 2015-10-21 DIAGNOSIS — R399 Unspecified symptoms and signs involving the genitourinary system: Secondary | ICD-10-CM

## 2015-10-21 DIAGNOSIS — E119 Type 2 diabetes mellitus without complications: Secondary | ICD-10-CM | POA: Diagnosis not present

## 2015-10-21 DIAGNOSIS — N183 Chronic kidney disease, stage 3 unspecified: Secondary | ICD-10-CM

## 2015-10-21 DIAGNOSIS — M150 Primary generalized (osteo)arthritis: Secondary | ICD-10-CM | POA: Diagnosis not present

## 2015-10-21 DIAGNOSIS — E1122 Type 2 diabetes mellitus with diabetic chronic kidney disease: Secondary | ICD-10-CM | POA: Diagnosis not present

## 2015-10-21 DIAGNOSIS — N189 Chronic kidney disease, unspecified: Secondary | ICD-10-CM | POA: Diagnosis not present

## 2015-10-21 DIAGNOSIS — M159 Polyosteoarthritis, unspecified: Secondary | ICD-10-CM

## 2015-10-21 LAB — BASIC METABOLIC PANEL
BUN: 39 mg/dL — AB (ref 6–23)
CHLORIDE: 103 meq/L (ref 96–112)
CO2: 28 meq/L (ref 19–32)
Calcium: 9.8 mg/dL (ref 8.4–10.5)
Creatinine, Ser: 1.38 mg/dL (ref 0.40–1.50)
GFR: 51.86 mL/min — AB (ref >60.00)
GLUCOSE: 108 mg/dL — AB (ref 70–99)
POTASSIUM: 3.9 meq/L (ref 3.5–5.1)
SODIUM: 141 meq/L (ref 135–145)

## 2015-10-21 LAB — URINALYSIS, ROUTINE W REFLEX MICROSCOPIC
BILIRUBIN URINE: NEGATIVE
HGB URINE DIPSTICK: NEGATIVE
Ketones, ur: NEGATIVE
LEUKOCYTES UA: NEGATIVE
NITRITE: NEGATIVE
RBC / HPF: NONE SEEN (ref 0–?)
Specific Gravity, Urine: <=1.005 — AB (ref 1.000–1.030)
TOTAL PROTEIN, URINE-UPE24: NEGATIVE
UROBILINOGEN UA: 0.2 (ref 0.0–1.0)
Urine Glucose: NEGATIVE
WBC UA: NONE SEEN (ref 0–?)
pH: 6 (ref 5.0–8.0)

## 2015-10-21 LAB — HEMOGLOBIN A1C: Hgb A1c MFr Bld: 6.9 % — ABNORMAL HIGH (ref 4.6–6.5)

## 2015-10-21 MED ORDER — OXYCODONE-ACETAMINOPHEN 5-325 MG PO TABS: 1.0000 | ORAL_TABLET | Freq: Two times a day (BID) | ORAL | 0 refills | Status: DC | PRN

## 2015-10-21 NOTE — Patient Instructions (Addendum)
GO TO THE LAB : Get the blood work     GO TO THE FRONT DESK Schedule your next appointment for a  Yearly check up by  04-2016  Call anytime if you bladder symptoms increase a little, or if you have fever, chills, blood in the urine or difficulty urinating.

## 2015-10-21 NOTE — Progress Notes (Signed)
Subjective:    Patient ID: Derrick Fry, male    DOB: 08/25/1927, 82 y.o.   MRN: [id number]  DOS:  10/21/2015 Type of visit - description : ROV Interval history: DM- diet controlled, CBGs ~ 120 LUTS- for few months has notes his flow to be slower than usual, not taking Any new medication. Controlled substance management: On oxycodone 7.5 mg twice a day, symptoms controlled. Med list reviewed: Good compliance  Review of Systems No fever chills No chest or difficulty breathing No dysuria, gross hematuria or urinary frequency. No anxiety or depression  Past Medical History:  Diagnosis Date  . AAA (abdominal aortic aneurysm) ([place])   . CAD (coronary artery disease)    MI 08-04-85  . Complication of anesthesia   . Depression   . Diabetes mellitus with neuropathy ([place])   . Gait abnormality    chronic imbalance  . Gout    diskitis 10/2008, Dr Ouida Sills  . Hyperlipidemia   . Hypertension   . OSA (obstructive sleep apnea)    Limited CPAP tolerance  . Osteoarthritis   . Peripheral neuropathy ([place])   . Pneumonia    had right pneumonia pleurisy requiring resection of ribs and chest tube drainage at age 71  . Renal insufficiency    chronic w/ solitary kidney, congenital  . Small bowel obstruction ([place]) 04/01/2012    Past Surgical History:  Procedure Laterality Date  . ABDOMINAL AORTIC ANEURYSM REPAIR  remote   w/ iliac aneurysm repair in the 1990's  . ABDOMINAL AORTIC ANEURYSM REPAIR    . CARDIOVASCULAR STRESS TEST  10/13/2009   EF 57%  . CATARACT EXTRACTION  09/1999,04/2003   rt,left  . CORONARY ARTERY BYPASS GRAFT  01/04/86  . INGUINAL HERNIA REPAIR  03/07/83  . Knuckles replaced  05/2005   left hand  . NEPHRECTOMY  1996  . TOTAL KNEE ARTHROPLASTY  05/04/89   left  . US ECHOCARDIOGRAPHY  01/14/2007   EF 55-60%    Social History   Social History  . Marital status: Married    Spouse name: N/A  . Number of children: 2  . Years of education: N/A   Occupational History  .  retired Retired   Social History Main Topics  . Smoking status: Former Smoker    Quit date: 04/02/1972  . Smokeless tobacco: Never Used  . Alcohol use No     Comment: former heavy alcohol use  . Drug use: No  . Sexual activity: Not on file   Other Topics Concern  . Not on file   Social History Narrative   Lost a son    Lives at home with wife    Still drives         Medication List       Accurate as of 10/21/15 11:59 PM. Always use your most recent med list.          aspirin 81 MG tablet Take 81 mg by mouth daily.   atorvastatin 80 MG tablet Commonly known as:  LIPITOR Take 1 tablet (80 mg total) by mouth daily.   CENTRUM SILVER tablet Take 1 tablet by mouth daily.   cyanocobalamin 100 MCG tablet Take 100 mcg by mouth daily.   fish oil-omega-3 fatty acids 1000 MG capsule Take 2 g by mouth daily.   Flaxseed Oil 1000 MG Caps Take 1,000 mg by mouth 2 (two) times daily.   folic acid 1 MG tablet Commonly known as:  FOLVITE Take 1 mg by mouth  daily.   furosemide 40 MG tablet Commonly known as:  LASIX Take 1 tablet (40 mg total) by mouth daily.   gabapentin 400 MG capsule Commonly known as:  NEURONTIN Take 2 capsules (800 mg total) by mouth 3 (three) times daily.   glucosamine-chondroitin 500-400 MG tablet Take 1 tablet by mouth 3 (three) times daily.   glucose blood test strip Commonly known as:  ONE TOUCH ULTRA TEST Check blood sugar no more than twice daily.   methotrexate 2.5 MG tablet Commonly known as:  RHEUMATREX Take 2.5 mg by mouth once a week. Caution:Chemotherapy. Protect from light.   niacin 500 MG tablet Take 500 mg by mouth at bedtime.   nitroGLYCERIN 0.4 MG SL tablet Commonly known as:  NITROSTAT Place 1 tablet (0.4 mg total) under the tongue every 5 (five) minutes as needed for chest pain.   onetouch ultrasoft lancets Check blood sugar no more than twice daily.   oxyCODONE-acetaminophen 5-325 MG tablet Commonly known as:   PERCOCET/ROXICET Take 1 tablet by mouth 2 (two) times daily as needed for severe pain.   polyethylene glycol packet Commonly known as:  MIRALAX / GLYCOLAX Take 17 g by mouth as needed for mild constipation or moderate constipation.   predniSONE 5 MG tablet Commonly known as:  DELTASONE Take 2.5 mg by mouth daily.   ULORIC 40 MG tablet Generic drug:  febuxostat Take 40 mg by mouth daily.          Objective:   Physical Exam BP 132/60 (BP Location: Left Arm, Patient Position: Sitting, Cuff Size: Normal)   Pulse 66   Temp 97.8 F (36.6 C) (Oral)   Resp 14   Ht 5\' 6"  (1.676 m)   Wt 162 lb 4 oz (73.6 kg)   SpO2 96%   BMI 26.19 kg/m  General:   Well developed, well nourished . NAD.  HEENT:  Normocephalic . Face symmetric, atraumatic Lungs:  CTA B except for dry crackles at bases Normal respiratory effort, no intercostal retractions, no accessory muscle use. Heart: RRR,  no murmur.  No pretibial edema bilaterally  Skin: Not pale. Not jaundice Neurologic:  alert & oriented X3.  Speech normal, gait appropriate for age and unassisted Psych--  Cognition and judgment appear intact.  Cooperative with normal attention span and concentration.  Behavior appropriate. No anxious or depressed appearing.      Assessment & Plan:   Assessment>  DM with neuropathy-CRI HTN Hyperlipidemia CRI congenital solitary kidney CAD MI 1987 AAA s/p repair Pulmonary fibrosis, noted in a CT, no previous eval by pulm MSK: Dr Amil Amen  ---PSORIATIC arthritis on MTX ---DJD ---GOUTt --diskitis 2010, on prednisone ---Pain mngmt: on oxycodone d/t neuropathy, UDS 10-2014 low risk  -- DEXA 08-2013 wnl OSA Limited CPAP tolerance Pneumonia, R side at age 57 , pleurisy, chest tube, [facility]  PLAN:  DM: Diet control, check A1c. CRI: Check a BMP. Controlled substance management: On oxycodone 7.5 mg twice a day as needed for DJD and psoriatic arthritis. Sx controlled. Will  decrease oxycodone to 5 mg twice a day, prescription issued. CAD: Saw cardiology 2 months ago, note reviewed, felt to be stable. L UTS: Mild symptoms, last DRE-PSA 2012. Will check a UA, urine culture, otherwise observation. See AVS RTC 2-18 cpx

## 2015-10-21 NOTE — Progress Notes (Signed)
Pre visit review using our clinic review tool, if applicable. No additional management support is needed unless otherwise documented below in the visit note. 

## 2015-10-22 LAB — URINE CULTURE

## 2015-10-22 NOTE — Assessment & Plan Note (Signed)
DM: Diet control, check A1c. CRI: Check a BMP. Controlled substance management: On oxycodone 7.5 mg twice a day as needed for DJD and psoriatic arthritis. Sx controlled. Will decrease oxycodone to 5 mg twice a day, prescription issued. CAD: Saw cardiology 2 months ago, note reviewed, felt to be stable. L UTS: Mild symptoms, last DRE-PSA 2012. Will check a UA, urine culture, otherwise observation. See AVS RTC 2-18 cpx

## 2015-11-02 DIAGNOSIS — M5136 Other intervertebral disc degeneration, lumbar region: Secondary | ICD-10-CM | POA: Diagnosis not present

## 2015-11-02 DIAGNOSIS — M1A09X1 Idiopathic chronic gout, multiple sites, with tophus (tophi): Secondary | ICD-10-CM | POA: Diagnosis not present

## 2015-11-02 DIAGNOSIS — Z79899 Other long term (current) drug therapy: Secondary | ICD-10-CM | POA: Diagnosis not present

## 2015-11-02 DIAGNOSIS — M150 Primary generalized (osteo)arthritis: Secondary | ICD-10-CM | POA: Diagnosis not present

## 2015-11-02 DIAGNOSIS — M2550 Pain in unspecified joint: Secondary | ICD-10-CM | POA: Diagnosis not present

## 2015-11-02 DIAGNOSIS — N183 Chronic kidney disease, stage 3 (moderate): Secondary | ICD-10-CM | POA: Diagnosis not present

## 2015-11-02 DIAGNOSIS — L4059 Other psoriatic arthropathy: Secondary | ICD-10-CM | POA: Diagnosis not present

## 2015-11-09 DIAGNOSIS — M1A09X1 Idiopathic chronic gout, multiple sites, with tophus (tophi): Secondary | ICD-10-CM | POA: Diagnosis not present

## 2015-11-24 ENCOUNTER — Telehealth: Payer: Self-pay | Admitting: Internal Medicine

## 2015-11-24 MED ORDER — OXYCODONE-ACETAMINOPHEN 5-325 MG PO TABS: 1.0000 | ORAL_TABLET | Freq: Two times a day (BID) | ORAL | 0 refills | Status: DC | PRN

## 2015-11-24 NOTE — Telephone Encounter (Signed)
Pt is requesting refill on Oxycodone.  Last OV: 10/21/2015 Last Fill: 10/21/2015 #60 and 0RF UDS: 09/22/2015 Low risk  Please advise.

## 2015-11-24 NOTE — Telephone Encounter (Signed)
Pt is requesting a refill for oxyCODONE.     [id number] - when ready. Pt says that he is out. Pt is stating that he called it in on Monday, not showing original request.

## 2015-11-24 NOTE — Telephone Encounter (Signed)
Rx's for September and October 2017 printed, awaiting MD signature.

## 2015-11-24 NOTE — Telephone Encounter (Signed)
Called pt. Spoke with spouse. Informed her that Rx is up front for pick up when ready

## 2015-11-24 NOTE — Telephone Encounter (Signed)
Ok 60, 2 prescriptions

## 2015-11-24 NOTE — Telephone Encounter (Signed)
Please inform Pt that Rx has been placed at front desk for pick up at his convenience. Thank you.  

## 2015-11-29 DIAGNOSIS — M1A09X1 Idiopathic chronic gout, multiple sites, with tophus (tophi): Secondary | ICD-10-CM | POA: Diagnosis not present

## 2015-11-30 DIAGNOSIS — M545 Low back pain: Secondary | ICD-10-CM | POA: Diagnosis not present

## 2015-11-30 DIAGNOSIS — M47816 Spondylosis without myelopathy or radiculopathy, lumbar region: Secondary | ICD-10-CM | POA: Diagnosis not present

## 2015-12-02 ENCOUNTER — Other Ambulatory Visit: Payer: Self-pay | Admitting: Internal Medicine

## 2015-12-14 DIAGNOSIS — G8929 Other chronic pain: Secondary | ICD-10-CM | POA: Diagnosis not present

## 2015-12-14 DIAGNOSIS — M47816 Spondylosis without myelopathy or radiculopathy, lumbar region: Secondary | ICD-10-CM | POA: Diagnosis not present

## 2015-12-14 DIAGNOSIS — M545 Low back pain: Secondary | ICD-10-CM | POA: Diagnosis not present

## 2015-12-14 DIAGNOSIS — Z6825 Body mass index (BMI) 25.0-25.9, adult: Secondary | ICD-10-CM | POA: Diagnosis not present

## 2016-01-17 ENCOUNTER — Encounter: Payer: Self-pay | Admitting: Internal Medicine

## 2016-01-17 ENCOUNTER — Ambulatory Visit (INDEPENDENT_AMBULATORY_CARE_PROVIDER_SITE_OTHER): Payer: PPO | Admitting: Internal Medicine

## 2016-01-17 VITALS — BP 132/68 | HR 79 | Temp 98.1°F | Resp 14 | Ht 66.0 in | Wt 162.2 lb

## 2016-01-17 DIAGNOSIS — H9211 Otorrhea, right ear: Secondary | ICD-10-CM | POA: Diagnosis not present

## 2016-01-17 DIAGNOSIS — M7989 Other specified soft tissue disorders: Secondary | ICD-10-CM

## 2016-01-17 MED ORDER — CIPROFLOXACIN-DEXAMETHASONE 0.3-0.1 % OT SUSP: 4.0000 [drp] | Freq: Two times a day (BID) | OTIC | 0 refills | Status: DC

## 2016-01-17 MED ORDER — AMOXICILLIN 875 MG PO TABS: 875.0000 mg | ORAL_TABLET | Freq: Two times a day (BID) | ORAL | 0 refills | Status: DC

## 2016-01-17 NOTE — Progress Notes (Signed)
Subjective:    Patient ID: Derrick Fry, male    DOB: 1928/03/01, 82 y.o.   MRN: [id number]  DOS:  01/17/2016 Type of visit - description : Acute visit Interval history: Long history of lower extremity edema, worse on the left, usually better with leg elevation however in the last few days the swelling is not going down as before and he has residual edema even after leg elevation. Also, 2 weeks history of dry cough, "head and chest" congestion, postnasal dripping. Taking Mucinex. In general wife has noted him to be very weak lately, today for the first time he feels is slightly better.    Review of Systems Had low-grade fever with the onset of respiratory symptoms. Denies chest pain, difficulty breathing or palpitations No recent airplane trips or prolonged car trip. Denies any particular ear pain, discharge.  Past Medical History:  Diagnosis Date  . AAA (abdominal aortic aneurysm) ([place])   . CAD (coronary artery disease)    MI 08-04-85  . Complication of anesthesia   . Depression   . Diabetes mellitus with neuropathy ([place])   . Gait abnormality    chronic imbalance  . Gout    diskitis 10/2008, Dr Ouida Sills  . Hyperlipidemia   . Hypertension   . OSA (obstructive sleep apnea)    Limited CPAP tolerance  . Osteoarthritis   . Peripheral neuropathy ([place])   . Pneumonia    had right pneumonia pleurisy requiring resection of ribs and chest tube drainage at age 54  . Renal insufficiency    chronic w/ solitary kidney, congenital  . Small bowel obstruction 04/01/2012    Past Surgical History:  Procedure Laterality Date  . ABDOMINAL AORTIC ANEURYSM REPAIR  remote   w/ iliac aneurysm repair in the 1990's  . ABDOMINAL AORTIC ANEURYSM REPAIR    . CARDIOVASCULAR STRESS TEST  10/13/2009   EF 57%  . CATARACT EXTRACTION  09/1999,04/2003   rt,left  . CORONARY ARTERY BYPASS GRAFT  01/04/86  . INGUINAL HERNIA REPAIR  03/07/83  . Knuckles replaced  05/2005   left hand  . NEPHRECTOMY  1996  .  TOTAL KNEE ARTHROPLASTY  05/04/89   left  . US ECHOCARDIOGRAPHY  01/14/2007   EF 55-60%    Social History   Social History  . Marital status: Married    Spouse name: N/A  . Number of children: 2  . Years of education: N/A   Occupational History  . retired Retired   Social History Main Topics  . Smoking status: Former Smoker    Quit date: 04/02/1972  . Smokeless tobacco: Never Used  . Alcohol use No     Comment: former heavy alcohol use  . Drug use: No  . Sexual activity: Not on file   Other Topics Concern  . Not on file   Social History Narrative   Lost a son    Lives at home with wife    Still drives         Medication List       Accurate as of 01/17/16 11:59 PM. Always use your most recent med list.          amoxicillin 875 MG tablet Commonly known as:  AMOXIL Take 1 tablet (875 mg total) by mouth 2 (two) times daily.   aspirin 81 MG tablet Take 81 mg by mouth daily.   atorvastatin 80 MG tablet Commonly known as:  LIPITOR Take 1 tablet (80 mg total) by mouth daily.  CENTRUM SILVER tablet Take 1 tablet by mouth daily.   ciprofloxacin-dexamethasone otic suspension Commonly known as:  CIPRODEX Place 4 drops into the right ear 2 (two) times daily.   cyanocobalamin 100 MCG tablet Take 100 mcg by mouth daily.   fish oil-omega-3 fatty acids 1000 MG capsule Take 2 g by mouth daily.   Flaxseed Oil 1000 MG Caps Take 1,000 mg by mouth 2 (two) times daily.   folic acid 1 MG tablet Commonly known as:  FOLVITE Take 1 mg by mouth daily.   furosemide 40 MG tablet Commonly known as:  LASIX Take 1 tablet (40 mg total) by mouth daily.   gabapentin 400 MG capsule Commonly known as:  NEURONTIN Take 2 capsules (800 mg total) by mouth 3 (three) times daily.   glucosamine-chondroitin 500-400 MG tablet Take 1 tablet by mouth 3 (three) times daily.   glucose blood test strip Commonly known as:  ONE TOUCH ULTRA TEST Check blood sugar no more than twice  daily.   methotrexate 2.5 MG tablet Commonly known as:  RHEUMATREX Take 2.5 mg by mouth once a week. Caution:Chemotherapy. Protect from light.   niacin 500 MG tablet Take 500 mg by mouth at bedtime.   nitroGLYCERIN 0.4 MG SL tablet Commonly known as:  NITROSTAT Place 1 tablet (0.4 mg total) under the tongue every 5 (five) minutes as needed for chest pain.   onetouch ultrasoft lancets Check blood sugar no more than twice daily.   oxyCODONE-acetaminophen 5-325 MG tablet Commonly known as:  PERCOCET/ROXICET Take 1 tablet by mouth 2 (two) times daily as needed for severe pain.   polyethylene glycol packet Commonly known as:  MIRALAX / GLYCOLAX Take 17 g by mouth as needed for mild constipation or moderate constipation.   predniSONE 5 MG tablet Commonly known as:  DELTASONE Take 2.5 mg by mouth daily.   ULORIC 40 MG tablet Generic drug:  febuxostat Take 40 mg by mouth daily.          Objective:   Physical Exam BP 132/68 (BP Location: Left Arm, Patient Position: Sitting, Cuff Size: Normal)   Pulse 79   Temp 98.1 F (36.7 C) (Oral)   Resp 14   Ht 5\' 6"  (1.676 m)   Wt 162 lb 4 oz (73.6 kg)   SpO2 91%   BMI 26.19 kg/m  General:   Well developed, well nourished . NAD.  HEENT:  Normocephalic . Face symmetric, atraumatic. + Anisocoria (chronic) Left TM normal Right year: Hard wax was removed, abundant green discharge w/ a odor, is hard to say but I think he has a TM perforation. No TTP at either mastoid area.. Lungs:  Clear except for dry crackles at bedtime both bases. Symmetric. Normal respiratory effort, no intercostal retractions, no accessory muscle use. Heart: RRR, occasional irreg beat, not new per patient;   no murmur.  + Edema bilaterally, left calf is a slightly larger by 3/4 inch in circumference. No TTP, left ankle is also slightly larger. Skin: Not pale. Not jaundice Neurologic:  alert & oriented X3.  Speech normal, gait appropriate for age and  unassisted Psych--  Cognition and judgment appear intact.  Cooperative with normal attention span and concentration.  Behavior appropriate. No anxious or depressed appearing.      Assessment & Plan:  Assessment>  DM with neuropathy-CRI HTN Hyperlipidemia CRI congenital solitary kidney CAD MI 76 AAA s/p repair Pulmonary fibrosis, noted in a CT, no previous eval by pulm, Chronic pulmonary crackles at bases. MSK:  Dr Amil Amen  ---PSORIATIC arthritis on MTX ---DJD ---GOUTt --diskitis 2010, on prednisone ---Pain mngmt: on oxycodone d/t neuropathy, UDS 10-2014 low risk  -- DEXA 08-2013 wnl OSA Limited CPAP tolerance Pneumonia, R side at age 78 , pleurisy, chest tube, rib surgery Anisocoria  (R pupil larger) [facility]  PLAN:  Otitis media with perforation/URI: Recommend start amoxicillin, otic antibiotics and ENT referral. On clinical grounds he has no mastoiditis or malignant otitis externa. Recommend to call immediately his headache, dizziness, fever, chills, facial or ear swelling. Lower extremity edema: Check ultrasound, rule out DVT

## 2016-01-17 NOTE — Progress Notes (Signed)
Pre visit review using our clinic review tool, if applicable. No additional management support is needed unless otherwise documented below in the visit note. 

## 2016-01-17 NOTE — Patient Instructions (Signed)
Get your ultrasound today  Start taking amoxicillin by mouth  Start using the eardrops twice a day  We are referring you to an ear specialist  Call anytime if you have fever, chills or you get worse.  Call if not gradually improving

## 2016-01-18 ENCOUNTER — Ambulatory Visit (HOSPITAL_BASED_OUTPATIENT_CLINIC_OR_DEPARTMENT_OTHER)
Admission: RE | Admit: 2016-01-18 | Discharge: 2016-01-18 | Disposition: A | Discharge status: Home / Self Care | Payer: PPO | Source: Ambulatory Visit | Attending: Internal Medicine | Admitting: Internal Medicine

## 2016-01-18 ENCOUNTER — Telehealth: Payer: Self-pay | Admitting: Internal Medicine

## 2016-01-18 DIAGNOSIS — I82412 Acute embolism and thrombosis of left femoral vein: Secondary | ICD-10-CM | POA: Diagnosis not present

## 2016-01-18 DIAGNOSIS — R609 Edema, unspecified: Secondary | ICD-10-CM | POA: Insufficient documentation

## 2016-01-18 DIAGNOSIS — M7989 Other specified soft tissue disorders: Secondary | ICD-10-CM | POA: Insufficient documentation

## 2016-01-18 MED ORDER — RIVAROXABAN 20 MG PO TABS: 20.0000 mg | ORAL_TABLET | Freq: Every day | ORAL | 2 refills | Status: DC

## 2016-01-18 NOTE — Telephone Encounter (Signed)
Ultrasound show a DVT. I spoke with the patient, again he denies chest pain difficulty breathing, recent immobilization, surgeries or prolonged trips. This is his first, unprovoked DVT. Plan: Start Xarelto, starter sample provided.  Hold aspirin.  Come back in 4 weeks.  Call anytime if any evidence of bleeding, headache, abdominal pain.

## 2016-01-18 NOTE — Assessment & Plan Note (Signed)
Otitis media with perforation/URI: Recommend start amoxicillin, otic antibiotics and ENT referral. On clinical grounds he has no mastoiditis or malignant otitis externa. Recommend to call immediately his headache, dizziness, fever, chills, facial or ear swelling. Lower extremity edema: Check ultrasound, rule out DVT

## 2016-01-19 DIAGNOSIS — H6241 Otitis externa in other diseases classified elsewhere, right ear: Secondary | ICD-10-CM | POA: Diagnosis not present

## 2016-01-19 DIAGNOSIS — B369 Superficial mycosis, unspecified: Secondary | ICD-10-CM | POA: Diagnosis not present

## 2016-01-19 DIAGNOSIS — Z87891 Personal history of nicotine dependence: Secondary | ICD-10-CM | POA: Diagnosis not present

## 2016-01-21 ENCOUNTER — Telehealth: Payer: Self-pay | Admitting: Internal Medicine

## 2016-01-21 MED ORDER — OXYCODONE-ACETAMINOPHEN 5-325 MG PO TABS: 1.0000 | ORAL_TABLET | Freq: Two times a day (BID) | ORAL | 0 refills | Status: DC | PRN

## 2016-01-21 NOTE — Telephone Encounter (Signed)
Rx printed, awaiting MD signature.  

## 2016-01-21 NOTE — Telephone Encounter (Signed)
Pt is requesting refill on Oxycodone.  Last OV: 01/17/2016 Last Fill: 11/24/2015 #60 and 0RF UDS: 09/22/2015 Low risk  Please advise.

## 2016-01-21 NOTE — Telephone Encounter (Signed)
Ok 60, no RF 

## 2016-01-21 NOTE — Telephone Encounter (Signed)
Relation to [id number] Call back [phone]   Reason for call:  Patient requesting a refill oxyCODONE-acetaminophen (PERCOCET/ROXICET) 5-325 MG tablet

## 2016-01-21 NOTE — Telephone Encounter (Signed)
Informed Pt that Rx has been placed at front desk for pick up at his convenience. Pt verbalized understanding.

## 2016-01-31 DIAGNOSIS — H9313 Tinnitus, bilateral: Secondary | ICD-10-CM | POA: Diagnosis not present

## 2016-01-31 DIAGNOSIS — H9113 Presbycusis, bilateral: Secondary | ICD-10-CM | POA: Diagnosis not present

## 2016-01-31 DIAGNOSIS — H6241 Otitis externa in other diseases classified elsewhere, right ear: Secondary | ICD-10-CM | POA: Diagnosis not present

## 2016-01-31 DIAGNOSIS — B369 Superficial mycosis, unspecified: Secondary | ICD-10-CM | POA: Diagnosis not present

## 2016-02-02 DIAGNOSIS — M1A09X1 Idiopathic chronic gout, multiple sites, with tophus (tophi): Secondary | ICD-10-CM | POA: Diagnosis not present

## 2016-02-02 DIAGNOSIS — M150 Primary generalized (osteo)arthritis: Secondary | ICD-10-CM | POA: Diagnosis not present

## 2016-02-02 DIAGNOSIS — M5136 Other intervertebral disc degeneration, lumbar region: Secondary | ICD-10-CM | POA: Diagnosis not present

## 2016-02-02 DIAGNOSIS — M25561 Pain in right knee: Secondary | ICD-10-CM | POA: Diagnosis not present

## 2016-02-02 DIAGNOSIS — L4059 Other psoriatic arthropathy: Secondary | ICD-10-CM | POA: Diagnosis not present

## 2016-02-02 DIAGNOSIS — Z6824 Body mass index (BMI) 24.0-24.9, adult: Secondary | ICD-10-CM | POA: Diagnosis not present

## 2016-02-02 DIAGNOSIS — N183 Chronic kidney disease, stage 3 (moderate): Secondary | ICD-10-CM | POA: Diagnosis not present

## 2016-02-02 DIAGNOSIS — Z79899 Other long term (current) drug therapy: Secondary | ICD-10-CM | POA: Diagnosis not present

## 2016-02-02 DIAGNOSIS — M2550 Pain in unspecified joint: Secondary | ICD-10-CM | POA: Diagnosis not present

## 2016-02-02 LAB — CBC AND DIFFERENTIAL
HCT: 30 % — AB (ref 41–53)
Hemoglobin: 9.8 g/dL — AB (ref 13.5–17.5)
Neutrophils Absolute: 6 /uL
Platelets: 191 10*3/uL (ref 150–399)
WBC: 7 10*3/mL

## 2016-02-02 LAB — HEPATIC FUNCTION PANEL
ALT: 33 U/L (ref 10–40)
AST: 53 U/L — AB (ref 14–40)
Alkaline Phosphatase: 77 U/L (ref 25–125)
Bilirubin, Total: 0.5 mg/dL

## 2016-02-02 LAB — BASIC METABOLIC PANEL
BUN: 23 mg/dL — AB (ref 4–21)
CREATININE: 1.1 mg/dL (ref 0.6–1.3)
GLUCOSE: 176 mg/dL
POTASSIUM: 4.2 mmol/L (ref 3.4–5.3)
Sodium: 136 mmol/L — AB (ref 137–147)

## 2016-02-04 DIAGNOSIS — I82409 Acute embolism and thrombosis of unspecified deep veins of unspecified lower extremity: Secondary | ICD-10-CM

## 2016-02-04 DIAGNOSIS — I2699 Other pulmonary embolism without acute cor pulmonale: Secondary | ICD-10-CM

## 2016-02-04 HISTORY — DX: Other pulmonary embolism without acute cor pulmonale: I26.99

## 2016-02-04 HISTORY — DX: Acute embolism and thrombosis of unspecified deep veins of unspecified lower extremity: I82.409

## 2016-02-07 ENCOUNTER — Ambulatory Visit (INDEPENDENT_AMBULATORY_CARE_PROVIDER_SITE_OTHER): Payer: PPO | Admitting: Internal Medicine

## 2016-02-07 ENCOUNTER — Encounter: Payer: Self-pay | Admitting: Internal Medicine

## 2016-02-07 VITALS — BP 126/78 | HR 81 | Temp 97.5°F | Resp 14 | Ht 66.0 in | Wt 158.1 lb

## 2016-02-07 DIAGNOSIS — R109 Unspecified abdominal pain: Secondary | ICD-10-CM | POA: Diagnosis not present

## 2016-02-07 DIAGNOSIS — R634 Abnormal weight loss: Secondary | ICD-10-CM | POA: Diagnosis not present

## 2016-02-07 DIAGNOSIS — D649 Anemia, unspecified: Secondary | ICD-10-CM | POA: Diagnosis not present

## 2016-02-07 NOTE — Patient Instructions (Signed)
Please go to the lab, keep the appointment for 02/15/2016.

## 2016-02-07 NOTE — Progress Notes (Signed)
Pre visit review using our clinic review tool, if applicable. No additional management support is needed unless otherwise documented below in the visit note. 

## 2016-02-07 NOTE — Progress Notes (Signed)
Subjective:    Patient ID: Derrick Fry, male    DOB: [birth year], 82 y.o.   MRN: [id number]  DOS:  02/07/2016 Type of visit - description : Acute visit Interval history: Has been feeling very weak for the last 3-4 weeks. Went to see rheumatology 02/02/2016:they noted to have a drop in the hemoglobin from 11.8 to 9.8. no pancytopenia, MCV was elevated. They  asked me to assess him for the anemia  The patient reports no GI symptoms in the last few weeks however yesterday developed diarrhea and mid abdominal discomfort that persisted until today. Stools are watery, no loose. No blood in the stools. Did have a temperature of 99.7.  He is taking Xarelto recommended for recently dx of DVT.  When asked, he admits to some weight loss documented at about 4 pounds in the last 4 months. States his appetite is not very good. Denies postprandial abdominal pain.   Wt Readings from Last 3 Encounters:  02/07/16 158 lb 2 oz (71.7 kg)  01/17/16 162 lb 4 oz (73.6 kg)  10/21/15 162 lb 4 oz (73.6 kg)    Review of Systems Denies fever or other than yesterday. No chills No chest pain, difficulty breathing. Edema at baseline. Occasional palpitations but not severe. No vomiting, no blood in the stools. Denies depression. Not taking chronic NSAIDs ;  he does take chronic prednisone.  Past Medical History:  Diagnosis Date  . AAA (abdominal aortic aneurysm) ([place])   . CAD (coronary artery disease)    MI 08-04-85  . Complication of anesthesia   . Depression   . Diabetes mellitus with neuropathy ([place])   . Gait abnormality    chronic imbalance  . Gout    diskitis 10/2008, Dr Ouida Sills  . Hyperlipidemia   . Hypertension   . OSA (obstructive sleep apnea)    Limited CPAP tolerance  . Osteoarthritis   . Peripheral neuropathy ([place])   . Pneumonia    had right pneumonia pleurisy requiring resection of ribs and chest tube drainage at age 70  . Renal insufficiency    chronic w/ solitary kidney, congenital  .  Small bowel obstruction 04/01/2012    Past Surgical History:  Procedure Laterality Date  . ABDOMINAL AORTIC ANEURYSM REPAIR  remote   w/ iliac aneurysm repair in the 1990's  . ABDOMINAL AORTIC ANEURYSM REPAIR    . CARDIOVASCULAR STRESS TEST  10/13/2009   EF 57%  . CATARACT EXTRACTION  09/1999,04/2003   rt,left  . CORONARY ARTERY BYPASS GRAFT  01/04/86  . INGUINAL HERNIA REPAIR  03/07/83  . Knuckles replaced  05/2005   left hand  . NEPHRECTOMY  1996  . TOTAL KNEE ARTHROPLASTY  05/04/89   left  . US ECHOCARDIOGRAPHY  01/14/2007   EF 55-60%    Social History   Social History  . Marital status: Married    Spouse name: N/A  . Number of children: 2  . Years of education: N/A   Occupational History  . retired Retired   Social History Main Topics  . Smoking status: Former Smoker    Quit date: 04/02/1972  . Smokeless tobacco: Never Used  . Alcohol use No     Comment: former heavy alcohol use  . Drug use: No  . Sexual activity: Not on file   Other Topics Concern  . Not on file   Social History Narrative   Lost a son    Lives at home with wife    Still drives  Medication List       Accurate as of 02/07/16 11:59 PM. Always use your most recent med list.          aspirin 81 MG tablet Take 81 mg by mouth daily.   atorvastatin 80 MG tablet Commonly known as:  LIPITOR Take 1 tablet (80 mg total) by mouth daily.   CENTRUM SILVER tablet Take 1 tablet by mouth daily.   cyanocobalamin 100 MCG tablet Take 100 mcg by mouth daily.   fish oil-omega-3 fatty acids 1000 MG capsule Take 2 g by mouth daily.   Flaxseed Oil 1000 MG Caps Take 1,000 mg by mouth 2 (two) times daily.   folic acid 1 MG tablet Commonly known as:  FOLVITE Take 1 mg by mouth daily.   furosemide 40 MG tablet Commonly known as:  LASIX Take 1 tablet (40 mg total) by mouth daily.   gabapentin 400 MG capsule Commonly known as:  NEURONTIN Take 2 capsules (800 mg total) by mouth 3 (three)  times daily.   glucosamine-chondroitin 500-400 MG tablet Take 1 tablet by mouth 3 (three) times daily.   glucose blood test strip Commonly known as:  ONE TOUCH ULTRA TEST Check blood sugar no more than twice daily.   methotrexate 2.5 MG tablet Commonly known as:  RHEUMATREX Take 2.5 mg by mouth once a week. Caution:Chemotherapy. Protect from light.   niacin 500 MG tablet Take 500 mg by mouth at bedtime.   nitroGLYCERIN 0.4 MG SL tablet Commonly known as:  NITROSTAT Place 1 tablet (0.4 mg total) under the tongue every 5 (five) minutes as needed for chest pain.   onetouch ultrasoft lancets Check blood sugar no more than twice daily.   oxyCODONE-acetaminophen 5-325 MG tablet Commonly known as:  PERCOCET/ROXICET Take 1 tablet by mouth 2 (two) times daily as needed for severe pain.   polyethylene glycol packet Commonly known as:  MIRALAX / GLYCOLAX Take 17 g by mouth as needed for mild constipation or moderate constipation.   predniSONE 5 MG tablet Commonly known as:  DELTASONE Take 2.5 mg by mouth daily.   rivaroxaban 20 MG Tabs tablet Commonly known as:  XARELTO Take 1 tablet (20 mg total) by mouth daily with supper.   ULORIC 40 MG tablet Generic drug:  febuxostat Take 40 mg by mouth daily.          Objective:   Physical Exam BP 126/78 (BP Location: Left Arm, Patient Position: Sitting, Cuff Size: Normal)   Pulse 81   Temp 97.5 F (36.4 C) (Oral)   Resp 14   Ht 5\' 6"  (1.676 m)   Wt 158 lb 2 oz (71.7 kg)   SpO2 90%   BMI 25.52 kg/m  General:   Well developed, older gentleman in no distress, not acutely ill. HEENT:  Normocephalic . Face symmetric, atraumatic. Conjunctiva: Minimal pallor Lungs:  Dry crackles at bases Normal respiratory effort, no intercostal retractions, no accessory muscle use. Heart: RRR,  no murmur.  no pretibial edema bilaterally  Abdomen:  Not distended, soft, non-tender. No rebound or rigidity.  Digital rectal exam: Small prostate,  stools brown, Hemoccult negative Skin:  Not jaundice Neurologic:  alert & oriented X3.  Speech normal, gait appropriate for age and unassisted Psych--  Cognition and judgment appear intact.  Cooperative with normal attention span and concentration.  Behavior appropriate. No anxious or depressed appearing.    Assessment & Plan:   Assessment  DM with neuropathy-CRI HTN Hyperlipidemia CRI congenital solitary kidney CAD MI  1987 AAA s/p repair Pulmonary fibrosis, noted in a CT, no previous eval by pulm, Chronic pulmonary crackles at bases. MSK: Dr Amil Amen  ---PSORIATIC arthritis on MTX ---DJD ---GOUTt --diskitis 2010, on prednisone ---Pain mngmt: on oxycodone d/t neuropathy, UDS 10-2014 low risk  -- DEXA 08-2013 wnl OSA Limited CPAP tolerance Pneumonia, R side at age 55 , pleurisy, chest tube, rib surgery Anisocoria  (R pupil larger) L LEG DVT 01-2016 [facility]  PLAN:  Fatigue, anemia: 82 year old gentleman with 4 weeks history of fatigue, found to have a 2 g drop from hemoglobin at rheumatology  02-02-16, was recommended to increase folic acid from 1 g to 2 g daily and be seen here for further eval.  No chronic  GI sx, except for diarrhea and mid abdominal pain yesterday. He recently start Xarelto for a DVT. Stools are Hemoccult negative today . Plan: CMP, CBC, iron studies, 123456, folic acid, reticulocyte count, peripheral blood smear. Etiology of sx is not completely clear. Further advise with results. Weight loss: Denies depression, check a TSH. abd pain and diarrhea for 1 day: Exam is benign, no worrisome symptoms. Recommend observation for now. DVT, left leg: Dx 01/17/2016, on Xarelto. Follow-up 02/15/2016 already scheduled   Today, I spent more than  42  min with the patient and his wife,>50% of the time counseling :  she is very concern, multiple questions answer; also, I review info from the rheumatology office

## 2016-02-08 ENCOUNTER — Telehealth: Payer: Self-pay | Admitting: Internal Medicine

## 2016-02-08 DIAGNOSIS — D649 Anemia, unspecified: Secondary | ICD-10-CM

## 2016-02-08 LAB — CBC WITH DIFFERENTIAL/PLATELET
BASOS PCT: 3.1 % — AB (ref 0.0–3.0)
Basophils Absolute: 0.3 10*3/uL — ABNORMAL HIGH (ref 0.0–0.1)
EOS PCT: 0.6 % (ref 0.0–5.0)
Eosinophils Absolute: 0.1 10*3/uL (ref 0.0–0.7)
HCT: 35.4 % — ABNORMAL LOW (ref 39.0–52.0)
HEMOGLOBIN: 11.8 g/dL — AB (ref 13.0–17.0)
Lymphocytes Relative: 7.1 % — ABNORMAL LOW (ref 12.0–46.0)
Lymphs Abs: 0.6 10*3/uL — ABNORMAL LOW (ref 0.7–4.0)
MCHC: 33.3 g/dL (ref 30.0–36.0)
MCV: 105.5 fl — ABNORMAL HIGH (ref 78.0–100.0)
MONO ABS: 0.4 10*3/uL (ref 0.1–1.0)
Monocytes Relative: 4.1 % (ref 3.0–12.0)
NEUTROS ABS: 7.7 10*3/uL (ref 1.4–7.7)
Neutrophils Relative %: 85.1 % — ABNORMAL HIGH (ref 43.0–77.0)
PLATELETS: 174 10*3/uL (ref 150.0–400.0)
RBC: 3.36 Mil/uL — ABNORMAL LOW (ref 4.22–5.81)
RDW: 19.4 % — AB (ref 11.5–15.5)
WBC: 9.1 10*3/uL (ref 4.0–10.5)

## 2016-02-08 LAB — COMPREHENSIVE METABOLIC PANEL
ALBUMIN: 3.7 g/dL (ref 3.5–5.2)
ALT: 36 U/L (ref 0–53)
AST: 52 U/L — AB (ref 0–37)
Alkaline Phosphatase: 73 U/L (ref 39–117)
BUN: 26 mg/dL — AB (ref 6–23)
CHLORIDE: 98 meq/L (ref 96–112)
CO2: 26 meq/L (ref 19–32)
Calcium: 10.4 mg/dL (ref 8.4–10.5)
Creatinine, Ser: 1.17 mg/dL (ref 0.40–1.50)
GFR: 62.7 mL/min (ref >60.00)
GLUCOSE: 148 mg/dL — AB (ref 70–99)
POTASSIUM: 4.6 meq/L (ref 3.5–5.1)
SODIUM: 136 meq/L (ref 135–145)
Total Bilirubin: 0.6 mg/dL (ref 0.2–1.2)
Total Protein: 6.7 g/dL (ref 6.0–8.3)

## 2016-02-08 LAB — RETICULOCYTES
ABS RETIC: 87000 {cells}/uL (ref 25000–90000)
RBC.: 3.48 MIL/uL — AB (ref 4.20–5.80)
Retic Ct Pct: 2.5 %

## 2016-02-08 LAB — FOLATE: Folate: >23.4 ng/mL (ref >5.9)

## 2016-02-08 LAB — IBC PANEL
IRON: 34 ug/dL — AB (ref 42–165)
SATURATION RATIOS: 11.7 % — AB (ref 20.0–50.0)
TRANSFERRIN: 208 mg/dL — AB (ref 212.0–360.0)

## 2016-02-08 LAB — VITAMIN B12: Vitamin B-12: 736 pg/mL (ref 211–911)

## 2016-02-08 LAB — TSH: TSH: 0.94 u[IU]/mL (ref 0.35–4.50)

## 2016-02-08 NOTE — Telephone Encounter (Signed)
FYI

## 2016-02-08 NOTE — Telephone Encounter (Signed)
Patient's wife called stating that Dr. Larose Kells is supposed to be calling sometime this afternoon with the patient's lab results. She states that their home phone is not currently working and would like a call on the cell phone number listed below.   Patient cell: [phone]  Wife cell: [phone]

## 2016-02-08 NOTE — Telephone Encounter (Signed)
Labs are pending. Please let them know.  Also I had a chance to see labs done 02/02/2016: CBC: WBC 7.0, hemoglobin 9.8, MCV 104, MCH 33.7, platelets 191. Creatinine 1.14. Blood sugar 176, AST 53 slightly high, ALT normal. Alkaline phosphatase 77 normal

## 2016-02-08 NOTE — Telephone Encounter (Signed)
Spoke with the patient's wife, since yesterday, he continue feeling very weak, still has abdominal pain and some diarrhea but less than before. He denies fever, chills, cough, shortness of breath, headaches, no major backaches. Labs show mild anemia and iron deficiency, white count is normal, some labs are pending. Plan: --Please enter a GI referral due to anemia --Wife will take the patient to the ER tomorrow if he is not improving ( feeling really poorly and etiology is not clear). --I asked him to come to the office 02/10/2016 at 11.15 am for a checkup

## 2016-02-08 NOTE — Assessment & Plan Note (Signed)
Fatigue, anemia: 82 year old gentleman with 4 weeks history of fatigue, found to have a 2 g drop from hemoglobin at rheumatology  02-02-16, was recommended to increase folic acid from 1 g to 2 g daily and be seen here for further eval.  No chronic  GI sx, except for diarrhea and mid abdominal pain yesterday. He recently start Xarelto for a DVT. Stools are Hemoccult negative today . Plan: CMP, CBC, iron studies, 123456, folic acid, reticulocyte count, peripheral blood smear. Etiology of sx is not completely clear. Further advise with results. Weight loss: Denies depression, check a TSH. abd pain and diarrhea for 1 day: Exam is benign, no worrisome symptoms. Recommend observation for now. DVT, left leg: Dx 01/17/2016, on Xarelto. Follow-up 02/15/2016 already scheduled

## 2016-02-09 ENCOUNTER — Telehealth: Payer: Self-pay | Admitting: Internal Medicine

## 2016-02-09 ENCOUNTER — Encounter: Payer: Self-pay | Admitting: Gastroenterology

## 2016-02-09 ENCOUNTER — Encounter (HOSPITAL_COMMUNITY): Payer: Self-pay | Admitting: *Deleted

## 2016-02-09 ENCOUNTER — Emergency Department (HOSPITAL_COMMUNITY): Payer: PPO

## 2016-02-09 ENCOUNTER — Observation Stay (HOSPITAL_COMMUNITY)
Admission: EM | Admit: 2016-02-09 | Discharge: 2016-02-10 | Disposition: A | Discharge status: Home / Self Care | Payer: PPO | Attending: Internal Medicine | Admitting: Internal Medicine

## 2016-02-09 ENCOUNTER — Encounter: Payer: Self-pay | Admitting: Internal Medicine

## 2016-02-09 DIAGNOSIS — I498 Other specified cardiac arrhythmias: Secondary | ICD-10-CM | POA: Diagnosis not present

## 2016-02-09 DIAGNOSIS — I723 Aneurysm of iliac artery: Secondary | ICD-10-CM | POA: Diagnosis not present

## 2016-02-09 DIAGNOSIS — N200 Calculus of kidney: Secondary | ICD-10-CM | POA: Insufficient documentation

## 2016-02-09 DIAGNOSIS — M109 Gout, unspecified: Secondary | ICD-10-CM | POA: Diagnosis not present

## 2016-02-09 DIAGNOSIS — Z6822 Body mass index (BMI) 22.0-22.9, adult: Secondary | ICD-10-CM | POA: Insufficient documentation

## 2016-02-09 DIAGNOSIS — R000 Tachycardia, unspecified: Secondary | ICD-10-CM | POA: Diagnosis not present

## 2016-02-09 DIAGNOSIS — I110 Hypertensive heart disease with heart failure: Secondary | ICD-10-CM | POA: Diagnosis present

## 2016-02-09 DIAGNOSIS — I2510 Atherosclerotic heart disease of native coronary artery without angina pectoris: Secondary | ICD-10-CM

## 2016-02-09 DIAGNOSIS — R197 Diarrhea, unspecified: Secondary | ICD-10-CM

## 2016-02-09 DIAGNOSIS — E871 Hypo-osmolality and hyponatremia: Secondary | ICD-10-CM

## 2016-02-09 DIAGNOSIS — E119 Type 2 diabetes mellitus without complications: Secondary | ICD-10-CM

## 2016-02-09 DIAGNOSIS — Z87891 Personal history of nicotine dependence: Secondary | ICD-10-CM | POA: Insufficient documentation

## 2016-02-09 DIAGNOSIS — E46 Unspecified protein-calorie malnutrition: Secondary | ICD-10-CM

## 2016-02-09 DIAGNOSIS — Z905 Acquired absence of kidney: Secondary | ICD-10-CM | POA: Insufficient documentation

## 2016-02-09 DIAGNOSIS — M069 Rheumatoid arthritis, unspecified: Secondary | ICD-10-CM | POA: Diagnosis not present

## 2016-02-09 DIAGNOSIS — I493 Ventricular premature depolarization: Secondary | ICD-10-CM | POA: Diagnosis not present

## 2016-02-09 DIAGNOSIS — R918 Other nonspecific abnormal finding of lung field: Secondary | ICD-10-CM | POA: Insufficient documentation

## 2016-02-09 DIAGNOSIS — I272 Pulmonary hypertension, unspecified: Secondary | ICD-10-CM | POA: Diagnosis not present

## 2016-02-09 DIAGNOSIS — J8410 Pulmonary fibrosis, unspecified: Secondary | ICD-10-CM | POA: Insufficient documentation

## 2016-02-09 DIAGNOSIS — E7800 Pure hypercholesterolemia, unspecified: Secondary | ICD-10-CM | POA: Insufficient documentation

## 2016-02-09 DIAGNOSIS — E1142 Type 2 diabetes mellitus with diabetic polyneuropathy: Secondary | ICD-10-CM | POA: Diagnosis not present

## 2016-02-09 DIAGNOSIS — F329 Major depressive disorder, single episode, unspecified: Secondary | ICD-10-CM | POA: Diagnosis not present

## 2016-02-09 DIAGNOSIS — R634 Abnormal weight loss: Secondary | ICD-10-CM | POA: Diagnosis not present

## 2016-02-09 DIAGNOSIS — I10 Essential (primary) hypertension: Secondary | ICD-10-CM | POA: Diagnosis not present

## 2016-02-09 DIAGNOSIS — I491 Atrial premature depolarization: Secondary | ICD-10-CM | POA: Diagnosis not present

## 2016-02-09 DIAGNOSIS — Z888 Allergy status to other drugs, medicaments and biological substances status: Secondary | ICD-10-CM | POA: Insufficient documentation

## 2016-02-09 DIAGNOSIS — R627 Adult failure to thrive: Principal | ICD-10-CM | POA: Diagnosis present

## 2016-02-09 DIAGNOSIS — Z86718 Personal history of other venous thrombosis and embolism: Secondary | ICD-10-CM

## 2016-02-09 DIAGNOSIS — I2699 Other pulmonary embolism without acute cor pulmonale: Secondary | ICD-10-CM | POA: Diagnosis not present

## 2016-02-09 DIAGNOSIS — M1990 Unspecified osteoarthritis, unspecified site: Secondary | ICD-10-CM | POA: Diagnosis not present

## 2016-02-09 DIAGNOSIS — M6281 Muscle weakness (generalized): Secondary | ICD-10-CM

## 2016-02-09 DIAGNOSIS — I082 Rheumatic disorders of both aortic and tricuspid valves: Secondary | ICD-10-CM | POA: Diagnosis not present

## 2016-02-09 DIAGNOSIS — Z9889 Other specified postprocedural states: Secondary | ICD-10-CM | POA: Insufficient documentation

## 2016-02-09 DIAGNOSIS — E860 Dehydration: Secondary | ICD-10-CM | POA: Diagnosis not present

## 2016-02-09 DIAGNOSIS — I499 Cardiac arrhythmia, unspecified: Secondary | ICD-10-CM

## 2016-02-09 DIAGNOSIS — Z7901 Long term (current) use of anticoagulants: Secondary | ICD-10-CM | POA: Insufficient documentation

## 2016-02-09 DIAGNOSIS — Z951 Presence of aortocoronary bypass graft: Secondary | ICD-10-CM | POA: Insufficient documentation

## 2016-02-09 DIAGNOSIS — Z96652 Presence of left artificial knee joint: Secondary | ICD-10-CM | POA: Insufficient documentation

## 2016-02-09 DIAGNOSIS — D539 Nutritional anemia, unspecified: Secondary | ICD-10-CM | POA: Diagnosis not present

## 2016-02-09 DIAGNOSIS — R1010 Upper abdominal pain, unspecified: Secondary | ICD-10-CM | POA: Diagnosis not present

## 2016-02-09 DIAGNOSIS — G4733 Obstructive sleep apnea (adult) (pediatric): Secondary | ICD-10-CM | POA: Diagnosis not present

## 2016-02-09 DIAGNOSIS — R2689 Other abnormalities of gait and mobility: Secondary | ICD-10-CM | POA: Diagnosis not present

## 2016-02-09 DIAGNOSIS — R112 Nausea with vomiting, unspecified: Secondary | ICD-10-CM | POA: Diagnosis present

## 2016-02-09 HISTORY — DX: Acute myocardial infarction, unspecified: I21.9

## 2016-02-09 HISTORY — DX: Other pulmonary embolism without acute cor pulmonale: I26.99

## 2016-02-09 HISTORY — DX: Personal history of other venous thrombosis and embolism: Z86.718

## 2016-02-09 HISTORY — DX: Other chronic pain: G89.29

## 2016-02-09 HISTORY — DX: Unspecified osteoarthritis, unspecified site: M19.90

## 2016-02-09 HISTORY — DX: Acute embolism and thrombosis of unspecified deep veins of unspecified lower extremity: I82.409

## 2016-02-09 HISTORY — DX: Cardiac murmur, unspecified: R01.1

## 2016-02-09 HISTORY — DX: Low back pain, unspecified: M54.50

## 2016-02-09 HISTORY — DX: Cardiac arrhythmia, unspecified: I49.9

## 2016-02-09 HISTORY — DX: Low back pain: M54.5

## 2016-02-09 HISTORY — DX: Anemia, unspecified: D64.9

## 2016-02-09 HISTORY — DX: Basal cell carcinoma of skin of unspecified parts of face: C44.310

## 2016-02-09 LAB — SEDIMENTATION RATE: Sed Rate: 57 mm/hr — ABNORMAL HIGH (ref 0–16)

## 2016-02-09 LAB — COMPREHENSIVE METABOLIC PANEL
ALK PHOS: 67 U/L (ref 38–126)
ALT: 35 U/L (ref 17–63)
AST: 56 U/L — AB (ref 15–41)
Albumin: 2.7 g/dL — ABNORMAL LOW (ref 3.5–5.0)
Anion gap: 7 (ref 5–15)
BILIRUBIN TOTAL: 0.6 mg/dL (ref 0.3–1.2)
BUN: 22 mg/dL — AB (ref 6–20)
CALCIUM: 9.8 mg/dL (ref 8.9–10.3)
CO2: 26 mmol/L (ref 22–32)
CREATININE: 1.06 mg/dL (ref 0.61–1.24)
Chloride: 100 mmol/L — ABNORMAL LOW (ref 101–111)
Glucose, Bld: 242 mg/dL — ABNORMAL HIGH (ref 65–99)
Potassium: 4.3 mmol/L (ref 3.5–5.1)
Sodium: 133 mmol/L — ABNORMAL LOW (ref 135–145)
TOTAL PROTEIN: 5.7 g/dL — AB (ref 6.5–8.1)

## 2016-02-09 LAB — CBC
HCT: 34.4 % — ABNORMAL LOW (ref 39.0–52.0)
Hemoglobin: 11.4 g/dL — ABNORMAL LOW (ref 13.0–17.0)
MCH: 34.1 pg — ABNORMAL HIGH (ref 26.0–34.0)
MCHC: 33.1 g/dL (ref 30.0–36.0)
MCV: 103 fL — AB (ref 78.0–100.0)
PLATELETS: 160 10*3/uL (ref 150–400)
RBC: 3.34 MIL/uL — ABNORMAL LOW (ref 4.22–5.81)
RDW: 17.2 % — AB (ref 11.5–15.5)
WBC: 9.3 10*3/uL (ref 4.0–10.5)

## 2016-02-09 LAB — URINALYSIS, ROUTINE W REFLEX MICROSCOPIC
Bilirubin Urine: NEGATIVE
GLUCOSE, UA: 50 mg/dL — AB
Hgb urine dipstick: NEGATIVE
KETONES UR: NEGATIVE mg/dL
LEUKOCYTES UA: NEGATIVE
NITRITE: NEGATIVE
PROTEIN: NEGATIVE mg/dL
Specific Gravity, Urine: 1.006 (ref 1.005–1.030)
pH: 6 (ref 5.0–8.0)

## 2016-02-09 LAB — I-STAT TROPONIN, ED: TROPONIN I, POC: 0 ng/mL (ref 0.00–0.08)

## 2016-02-09 LAB — T4, FREE: Free T4: 1.11 ng/dL (ref 0.61–1.12)

## 2016-02-09 LAB — PREALBUMIN: Prealbumin: 22.7 mg/dL (ref 18–38)

## 2016-02-09 LAB — VITAMIN B12: VITAMIN B 12: 989 pg/mL — AB (ref 180–914)

## 2016-02-09 LAB — LIPASE, BLOOD: Lipase: 23 U/L (ref 11–51)

## 2016-02-09 LAB — MAGNESIUM: MAGNESIUM: 1.8 mg/dL (ref 1.7–2.4)

## 2016-02-09 LAB — TSH: TSH: 0.963 u[IU]/mL (ref 0.350–4.500)

## 2016-02-09 MED ORDER — RIVAROXABAN 20 MG PO TABS
20.0000 mg | ORAL_TABLET | Freq: Every day | ORAL | Status: DC
  Administered 2016-02-10: 20 mg via ORAL
  Filled 2016-02-09: qty 1

## 2016-02-09 MED ORDER — PREDNISONE 2.5 MG PO TABS
2.5000 mg | ORAL_TABLET | Freq: Every day | ORAL | Status: DC
  Administered 2016-02-10: 2.5 mg via ORAL
  Filled 2016-02-09: qty 1

## 2016-02-09 MED ORDER — SODIUM CHLORIDE 0.9 % IV BOLUS (SEPSIS)
500.0000 mL | Freq: Once | INTRAVENOUS | Status: AC
  Administered 2016-02-09: 500 mL via INTRAVENOUS

## 2016-02-09 MED ORDER — IOPAMIDOL (ISOVUE-370) INJECTION 76%
INTRAVENOUS | Status: AC
  Administered 2016-02-09: 100 mL
  Filled 2016-02-09: qty 100

## 2016-02-09 MED ORDER — SODIUM CHLORIDE 0.9 % IV SOLN
INTRAVENOUS | Status: AC
  Administered 2016-02-09: 22:00:00 via INTRAVENOUS

## 2016-02-09 MED ORDER — ONDANSETRON HCL 4 MG/2ML IJ SOLN
4.0000 mg | Freq: Four times a day (QID) | INTRAMUSCULAR | Status: DC | PRN
  Administered 2016-02-09: 4 mg via INTRAVENOUS
  Filled 2016-02-09: qty 2

## 2016-02-09 MED ORDER — ATORVASTATIN CALCIUM 80 MG PO TABS
80.0000 mg | ORAL_TABLET | Freq: Every day | ORAL | Status: DC
  Administered 2016-02-10: 80 mg via ORAL
  Filled 2016-02-09: qty 1

## 2016-02-09 MED ORDER — GABAPENTIN 400 MG PO CAPS
800.0000 mg | ORAL_CAPSULE | Freq: Three times a day (TID) | ORAL | Status: DC
  Administered 2016-02-09 – 2016-02-10 (×3): 800 mg via ORAL
  Filled 2016-02-09 (×4): qty 2

## 2016-02-09 MED ORDER — ONDANSETRON HCL 4 MG PO TABS
4.0000 mg | ORAL_TABLET | Freq: Four times a day (QID) | ORAL | Status: DC | PRN
Start: 2016-02-09 — End: 2016-02-10

## 2016-02-09 MED ORDER — ADULT MULTIVITAMIN W/MINERALS CH
1.0000 | ORAL_TABLET | Freq: Every day | ORAL | Status: DC
  Administered 2016-02-10: 1 via ORAL
  Filled 2016-02-09: qty 1

## 2016-02-09 MED ORDER — VITAMIN B-12 100 MCG PO TABS
100.0000 ug | ORAL_TABLET | Freq: Every day | ORAL | Status: DC
  Administered 2016-02-10: 100 ug via ORAL
  Filled 2016-02-09: qty 1

## 2016-02-09 MED ORDER — FEBUXOSTAT 40 MG PO TABS
40.0000 mg | ORAL_TABLET | Freq: Every day | ORAL | Status: DC
  Administered 2016-02-10: 40 mg via ORAL
  Filled 2016-02-09: qty 1

## 2016-02-09 MED ORDER — ACETAMINOPHEN 650 MG RE SUPP: 650.0000 mg | Freq: Four times a day (QID) | RECTAL | Status: DC | PRN

## 2016-02-09 MED ORDER — FOLIC ACID 1 MG PO TABS
1.0000 mg | ORAL_TABLET | Freq: Every day | ORAL | Status: DC
  Administered 2016-02-10: 1 mg via ORAL
  Filled 2016-02-09: qty 1

## 2016-02-09 MED ORDER — ACETAMINOPHEN 325 MG PO TABS: 650.0000 mg | ORAL_TABLET | Freq: Four times a day (QID) | ORAL | Status: DC | PRN

## 2016-02-09 MED ORDER — OXYCODONE-ACETAMINOPHEN 5-325 MG PO TABS: 1.0000 | ORAL_TABLET | Freq: Four times a day (QID) | ORAL | Status: DC | PRN

## 2016-02-09 MED ORDER — OMEGA-3-ACID ETHYL ESTERS 1 G PO CAPS
2.0000 g | ORAL_CAPSULE | Freq: Every day | ORAL | Status: DC
  Administered 2016-02-10: 2 g via ORAL
  Filled 2016-02-09: qty 2

## 2016-02-09 NOTE — ED Notes (Signed)
ED Provider at bedside. 

## 2016-02-09 NOTE — ED Notes (Signed)
Attempted report x1. RN unavailable @ this time. Left # for call back.

## 2016-02-09 NOTE — ED Notes (Signed)
Attempted report x2. RN still unavailable. Attempting to speak w/ Agricultural consultant.

## 2016-02-09 NOTE — Telephone Encounter (Addendum)
Caller name: Mrs Wiker  Relation to [id number]  Call back number: [phone] mobile    Reason for call:  Spouse was informed by PCP if patient doesn't feel better to take him to the ED, patient is on he's way to [facility] and wanted to make PCP aware. Patient was unsure if follow up appointment for 02/10/16 was needed, please advise

## 2016-02-09 NOTE — ED Provider Notes (Signed)
[facility] DEPT Provider Note   CSN: [id number] Arrival date & time: 02/09/16  1132     History   Chief Complaint Chief Complaint  Patient presents with  . Abdominal Pain  . Diarrhea    HPI DAYMIEN CUTCHIN is a 82 y.o. male.  HPI Patient presents with a few different complaints. Has had decreased appetite and weight loss. He has lost about 25 pounds. He initially was trying to lose by not eating his bread but then started getting red and the weight loss continued. He states he has no appetite. Also has had some dull upper abdominal pain since Monday. Had nausea with it. And starting on Monday also diarrhea. His been a little watery. No fevers or chills. No chest pain. He's also been worked up for anemia. Sees primary care doctor possible. Has had negative quiet clear. No chest pain. No cough. States he does feel short of breath. No blood in the stool or black stool.   Past Medical History:  Diagnosis Date  . AAA (abdominal aortic aneurysm) ([place])   . CAD (coronary artery disease)    MI [birth year]  . Complication of anesthesia   . Depression   . Diabetes mellitus with neuropathy ([place])   . Gait abnormality    chronic imbalance  . Gout    diskitis 10/2008, Dr Ouida Sills  . Hyperlipidemia   . Hypertension   . OSA (obstructive sleep apnea)    Limited CPAP tolerance  . Osteoarthritis   . Peripheral neuropathy ([place])   . Pneumonia    had right pneumonia pleurisy requiring resection of ribs and chest tube drainage at age 31  . Renal insufficiency    chronic w/ solitary kidney, congenital  . Small bowel obstruction 04/01/2012    Patient Active Problem List   Diagnosis Date Noted  . PCP NOTES >>>>> 11/20/2014  . Buzzing in ear 06/04/2013  . Pulmonary fibrosis ([place]) 01/29/2013  . Annual physical exam 07/11/2010  . AAA (abdominal aortic aneurysm) ([place]) 07/11/2010  . High cholesterol 03/23/2009  . Gout 08/22/2007  . GAIT DISTURBANCE 08/22/2007  . Osteoarthritis  04/23/2007  .  OBSTRUCTIVE SLEEP APNEA 01/03/2007  . DM II (diabetes mellitus, type II), controlled ([place]) 09/05/2006  .  peripheral neuropathy --UDS--pain mngmt  09/05/2006  . Essential hypertension 09/05/2006  . CAD (coronary artery disease) 09/05/2006  . RENAL INSUFFICIENCY, CHRONIC 09/05/2006  . SOLITARY KIDNEY, CONGENITAL 09/05/2006  . SLEEP APNEA 09/05/2006    Past Surgical History:  Procedure Laterality Date  . ABDOMINAL AORTIC ANEURYSM REPAIR  remote   w/ iliac aneurysm repair in the 1990's  . ABDOMINAL AORTIC ANEURYSM REPAIR    . CARDIOVASCULAR STRESS TEST  10/13/2009   EF 57%  . CATARACT EXTRACTION  09/1999,04/2003   rt,left  . CORONARY ARTERY BYPASS GRAFT  01/04/86  . INGUINAL HERNIA REPAIR  03/07/83  . Knuckles replaced  05/2005   left hand  . NEPHRECTOMY  1996  . TOTAL KNEE ARTHROPLASTY  05/04/89   left  . US ECHOCARDIOGRAPHY  01/14/2007   EF 55-60%       Home Medications    Prior to Admission medications   Medication Sig Start Date End Date Taking? Authorizing Provider  atorvastatin (LIPITOR) 80 MG tablet Take 1 tablet (80 mg total) by mouth daily. 12/02/15  Yes Colon Branch, MD  cyanocobalamin 100 MCG tablet Take 100 mcg by mouth daily.    Yes Historical Provider, MD  febuxostat (ULORIC) 40 MG tablet Take 40 mg by  mouth daily.    Yes Historical Provider, MD  fish oil-omega-3 fatty acids 1000 MG capsule Take 2 g by mouth daily.    Yes Historical Provider, MD  Flaxseed, Linseed, (FLAXSEED OIL) 1000 MG CAPS Take 1,000 mg by mouth 2 (two) times daily.    Yes Historical Provider, MD  folic acid (FOLVITE) 1 MG tablet Take 1 mg by mouth daily.   Yes Historical Provider, MD  furosemide (LASIX) 40 MG tablet Take 1 tablet (40 mg total) by mouth daily. 08/23/15  Yes Colon Branch, MD  gabapentin (NEURONTIN) 400 MG capsule Take 2 capsules (800 mg total) by mouth 3 (three) times daily. 09/13/15  Yes Colon Branch, MD  glucosamine-chondroitin 500-400 MG tablet Take 1 tablet by mouth 3 (three) times daily.     Yes Historical Provider, MD  glucose blood (ONE TOUCH ULTRA TEST) test strip Check blood sugar no more than twice daily. 07/20/14  Yes Colon Branch, MD  Lancets [facility] ULTRASOFT) lancets Check blood sugar no more than twice daily. 07/20/14  Yes Colon Branch, MD  methotrexate (RHEUMATREX) 2.5 MG tablet Take 15 mg by mouth once a week. Caution:Chemotherapy. Protect from light. Every Monday   Yes Historical Provider, MD  Multiple Vitamins-Minerals (CENTRUM SILVER) tablet Take 1 tablet by mouth daily.    Yes Historical Provider, MD  niacin 500 MG tablet Take 500 mg by mouth at bedtime.    Yes Historical Provider, MD  nitroGLYCERIN (NITROSTAT) 0.4 MG SL tablet Place 1 tablet (0.4 mg total) under the tongue every 5 (five) minutes as needed for chest pain. 08/05/15  Yes Thayer Headings, MD  oxyCODONE-acetaminophen (PERCOCET/ROXICET) 5-325 MG tablet Take 1 tablet by mouth 2 (two) times daily as needed for severe pain. 01/21/16  Yes Colon Branch, MD  polyethylene glycol [facility] / GLYCOLAX) packet Take 17 g by mouth as needed for mild constipation or moderate constipation.    Yes Historical Provider, MD  predniSONE (DELTASONE) 5 MG tablet Take 2.5 mg by mouth daily.    Yes Historical Provider, MD  rivaroxaban (XARELTO) 20 MG TABS tablet Take 1 tablet (20 mg total) by mouth daily with supper. 01/18/16  Yes Colon Branch, MD    Family History Family History  Problem Relation Age of Onset  . Lymphoma Sister   . Colon cancer Neg Hx   . Prostate cancer Neg Hx     Social History Social History  Substance Use Topics  . Smoking status: Former Smoker    Quit date: 04/02/1972  . Smokeless tobacco: Never Used  . Alcohol use No     Comment: former heavy alcohol use     Allergies   Colchicine   Review of Systems Review of Systems  Constitutional: Positive for appetite change, fatigue and unexpected weight change. Negative for fever.  HENT: Negative for congestion.   Respiratory: Positive for cough and  shortness of breath.   Gastrointestinal: Positive for diarrhea and nausea. Negative for vomiting.  Genitourinary: Negative for dysuria.  Musculoskeletal: Negative for back pain.  Neurological: Negative for dizziness.  Hematological: Negative for adenopathy.  Psychiatric/Behavioral: Negative for behavioral problems.     Physical Exam Updated Vital Signs BP 125/68   Pulse 89   Temp 98.8 F (37.1 C) (Oral)   Resp 14   Ht 5\' 7"  (1.702 m)   Wt 158 lb (71.7 kg)   SpO2 100%   BMI 24.75 kg/m   Physical Exam  Constitutional: He appears well-developed.  HENT:  Head: Atraumatic.  Eyes: EOM are normal.  Neck: Neck supple.  Cardiovascular: Normal rate.   He is having irregular beats.  Pulmonary/Chest: Effort normal.  Abdominal: There is no tenderness.  No real tenderness. Scar from previous AAA return.  Musculoskeletal: He exhibits no edema.  Neurological: No cranial nerve deficit.  Skin: Skin is warm. Capillary refill takes less than 2 seconds.  Psychiatric: He has a normal mood and affect.     ED Treatments / Results  Labs (all labs ordered are listed, but only abnormal results are displayed) Labs Reviewed  COMPREHENSIVE METABOLIC PANEL - Abnormal; Notable for the following:       Result Value   Sodium 133 (*)    Chloride 100 (*)    Glucose, Bld 242 (*)    BUN 22 (*)    Total Protein 5.7 (*)    Albumin 2.7 (*)    AST 56 (*)    All other components within normal limits  CBC - Abnormal; Notable for the following:    RBC 3.34 (*)    Hemoglobin 11.4 (*)    HCT 34.4 (*)    MCV 103.0 (*)    MCH 34.1 (*)    RDW 17.2 (*)    All other components within normal limits  URINALYSIS, ROUTINE W REFLEX MICROSCOPIC - Abnormal; Notable for the following:    Glucose, UA 50 (*)    All other components within normal limits  LIPASE, BLOOD  MAGNESIUM  I-STAT TROPOININ, ED    EKG  EKG Interpretation  Date/Time:  Wednesday February 09 2016 12:15:26 EST Ventricular Rate:   108 PR Interval:  152 QRS Duration: 116 QT Interval:  338 QTC Calculation: 452 R Axis:   -53 Text Interpretation:  Sinus tachycardia with Premature supraventricular complexes and with occasional Premature ventricular complexes Left anterior fascicular block Left ventricular hypertrophy with QRS widening and repolarization abnormality Abnormal ECG Confirmed by Alvino Chapel  MD, Ovid Curd [phone]) on 02/09/2016 3:24:39 PM       Radiology Ct Angio Chest Pe W And/or Wo Contrast  Result Date: 02/09/2016 CLINICAL DATA:  Shortness of breath, upper abdominal pain, weakness, weight loss, DVT, history of AAA EXAM: CT ANGIOGRAPHY CHEST CT ABDOMEN AND PELVIS WITH CONTRAST TECHNIQUE: Multidetector CT imaging of the chest was performed using the standard protocol during bolus administration of intravenous contrast. Multiplanar CT image reconstructions and MIPs were obtained to evaluate the vascular anatomy. Multidetector CT imaging of the abdomen and pelvis was performed using the standard protocol during bolus administration of intravenous contrast. CONTRAST:  100 mL Isovue 370 IV COMPARISON:  CT abdomen/pelvis dated 01/10/2013 FINDINGS: CTA CHEST FINDINGS Cardiovascular: Satisfactory opacification of the pulmonary arteries to the segmental level. Lobar pulmonary emboli in the right main pulmonary artery extending into the proximal right lower lobe pulmonary artery (series 407/ images 134, 144, and 151). Additional lobar pulmonary emboli in the left lower lobe (series 407/ image 165). Overall clot burden is small.  No evidence of right heart strain. The heart is top-normal in size.  No pericardial effusion. Atherosclerotic calcifications of the aortic arch with eccentric mural thrombus along the descending thoracic aorta. Three vessel coronary atherosclerosis. Postsurgical changes related to prior CABG. Mediastinum/Nodes: No suspicious mediastinal lymphadenopathy. Visualized thyroid is unremarkable. Lungs/Pleura:  Evaluation the lung parenchyma is mildly constrained by respiratory motion. Mild patchy/ ground-glass opacities in the right upper lobe (series 406/ image 64), favoring mild infection. Mild patchy opacities in the bilateral lower lobes with associated subpleural reticulation, possibly atelectasis, superimposed  interstitial lung disease not excluded. Underlying mild emphysematous changes. No pleural effusion or pneumothorax. Musculoskeletal: Mild wedging at T6. Median sternotomy with healed mid sternal fracture deformity (sagittal image 100). Review of the MIP images confirms the above findings. CT ABDOMEN and PELVIS FINDINGS Hepatobiliary: Liver is within normal limits. Gallbladder is unremarkable. No intrahepatic or extrahepatic ductal dilatation. Pancreas: Within normal limits. Spleen: Within normal limits. Adrenals/Urinary Tract: Adrenal glands are within normal limits. Left kidney is surgically absent. Right kidney is notable for mild cortical atrophy, vascular calcifications, and at least two nonobstructing renal calculi measuring 3 mm (series 501/ images 21 and 30). Moderate extrarenal pelvis without frank hydronephrosis. No right ureteral or bladder calculi. Bladder is within normal limits. Stomach/Bowel: Stomach is within normal limits. No evidence of bowel obstruction. Appendix is not discretely visualized. Left colonic diverticulosis, without evidence of diverticulitis. Vascular/Lymphatic: No evidence abdominal aortic aneurysm. Extensive atherosclerotic calcifications abdominal aorta and branch vessels. 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 No suspicious abdominopelvic lymphadenopathy. Reproductive: Prostate is unremarkable. Other: No abdominopelvic ascites. Musculoskeletal: Degenerative changes of the visualized thoracolumbar spine. Fusion at L4-5. Review of the MIP images confirms the above findings. IMPRESSION: Lobar pulmonary emboli in the bilateral lower  lobes. Overall clot burden is small. No evidence of right heart strain. Mild patchy/ground-glass opacities in the right upper lobe, favoring mild infection. No evidence of abdominal aortic aneurysm. 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 Status post left nephrectomy. Two nonobstructing right renal calculi measuring up to 3 mm. Moderate right extrarenal pelvis without frank hydronephrosis. Additional ancillary findings as above. Critical Value/emergent results were called by telephone at the time of interpretation on 02/09/2016 at 5:16 pm to Dr. Davonna Belling , who verbally acknowledged these results. Electronically Signed   By: Julian Hy M.D.   On: 02/09/2016 17:18   Ct Abdomen Pelvis W Contrast  Result Date: 02/09/2016 CLINICAL DATA:  Shortness of breath, upper abdominal pain, weakness, weight loss, DVT, history of AAA EXAM: CT ANGIOGRAPHY CHEST CT ABDOMEN AND PELVIS WITH CONTRAST TECHNIQUE: Multidetector CT imaging of the chest was performed using the standard protocol during bolus administration of intravenous contrast. Multiplanar CT image reconstructions and MIPs were obtained to evaluate the vascular anatomy. Multidetector CT imaging of the abdomen and pelvis was performed using the standard protocol during bolus administration of intravenous contrast. CONTRAST:  100 mL Isovue 370 IV COMPARISON:  CT abdomen/pelvis dated 01/10/2013 FINDINGS: CTA CHEST FINDINGS Cardiovascular: Satisfactory opacification of the pulmonary arteries to the segmental level. Lobar pulmonary emboli in the right main pulmonary artery extending into the proximal right lower lobe pulmonary artery (series 407/ images 134, 144, and 151). Additional lobar pulmonary emboli in the left lower lobe (series 407/ image 165). Overall clot burden is small.  No evidence of right heart strain. The heart is top-normal in size.  No pericardial effusion. Atherosclerotic calcifications of the  aortic arch with eccentric mural thrombus along the descending thoracic aorta. Three vessel coronary atherosclerosis. Postsurgical changes related to prior CABG. Mediastinum/Nodes: No suspicious mediastinal lymphadenopathy. Visualized thyroid is unremarkable. Lungs/Pleura: Evaluation the lung parenchyma is mildly constrained by respiratory motion. Mild patchy/ ground-glass opacities in the right upper lobe (series 406/ image 64), favoring mild infection. Mild patchy opacities in the bilateral lower lobes with associated subpleural reticulation, possibly atelectasis, superimposed interstitial lung disease not excluded. Underlying mild emphysematous changes. No pleural effusion or pneumothorax. Musculoskeletal: Mild wedging at T6. Median sternotomy  with healed mid sternal fracture deformity (sagittal image 100). Review of the MIP images confirms the above findings. CT ABDOMEN and PELVIS FINDINGS Hepatobiliary: Liver is within normal limits. Gallbladder is unremarkable. No intrahepatic or extrahepatic ductal dilatation. Pancreas: Within normal limits. Spleen: Within normal limits. Adrenals/Urinary Tract: Adrenal glands are within normal limits. Left kidney is surgically absent. Right kidney is notable for mild cortical atrophy, vascular calcifications, and at least two nonobstructing renal calculi measuring 3 mm (series 501/ images 21 and 30). Moderate extrarenal pelvis without frank hydronephrosis. No right ureteral or bladder calculi. Bladder is within normal limits. Stomach/Bowel: Stomach is within normal limits. No evidence of bowel obstruction. Appendix is not discretely visualized. Left colonic diverticulosis, without evidence of diverticulitis. Vascular/Lymphatic: No evidence abdominal aortic aneurysm. Extensive atherosclerotic calcifications abdominal aorta and branch vessels. 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 No suspicious abdominopelvic lymphadenopathy.  Reproductive: Prostate is unremarkable. Other: No abdominopelvic ascites. Musculoskeletal: Degenerative changes of the visualized thoracolumbar spine. Fusion at L4-5. Review of the MIP images confirms the above findings. IMPRESSION: Lobar pulmonary emboli in the bilateral lower lobes. Overall clot burden is small. No evidence of right heart strain. Mild patchy/ground-glass opacities in the right upper lobe, favoring mild infection. No evidence of abdominal aortic aneurysm. 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 Status post left nephrectomy. Two nonobstructing right renal calculi measuring up to 3 mm. Moderate right extrarenal pelvis without frank hydronephrosis. Additional ancillary findings as above. Critical Value/emergent results were called by telephone at the time of interpretation on 02/09/2016 at 5:16 pm to Dr. Davonna Belling , who verbally acknowledged these results. Electronically Signed   By: Julian Hy M.D.   On: 02/09/2016 17:18    Procedures Procedures (including critical care time)  Medications Ordered in ED Medications  sodium chloride 0.9 % bolus 500 mL (500 mLs Intravenous New Bag/Given 02/09/16 1603)  iopamidol (ISOVUE-370) 76 % injection (100 mLs  Contrast Given 02/09/16 1626)     Initial Impression / Assessment and Plan / ED Course  I have reviewed the triage vital signs and the nursing notes.  Pertinent labs & imaging results that were available during my care of the patient were reviewed by me and considered in my medical decision making (see chart for details).  Clinical Course     Patient was in a generalized weakness. Has had some abdominal pain nausea vomiting diarrhea. Had been on antibiotics previously but not currently. Has also had weight loss. Recently diagnosed with DVT in his left thigh. Has had some shortness of breath and episodes of tachycardia. He looks like he may have had a few runs of nonsustained V.  tach. Also has had episodes of tachycardia that could be brief A. fib or just a brief sinus tachycardia. Artery on Xarelto for his DVT. Also has anemia. With the tachycardia I think overnight monitoring would be prudent. Will discuss with internal medicine. Not hypoxic at rest..     Final Clinical Impressions(s) / ED Diagnoses   Final diagnoses:  Other acute pulmonary embolism without acute cor pulmonale (HCC)  Diarrhea, unspecified type  Tachycardia    New Prescriptions New Prescriptions   No medications on file     Davonna Belling, MD 02/09/16 1806

## 2016-02-09 NOTE — ED Triage Notes (Signed)
Pt reports recent hx of decreased appetite, went to pcp and was diagnosed with dvt. Having mid abd pain since Monday, pain is constant and having nausea. Reports diarrhea on Monday.

## 2016-02-09 NOTE — Telephone Encounter (Signed)
GI referral placed. Appt scheduled.

## 2016-02-09 NOTE — Addendum Note (Signed)
Addended byDamita Dunnings D on: 02/09/2016 07:45 AM   Modules accepted: Orders

## 2016-02-09 NOTE — H&P (Signed)
History and Physical    Derrick Fry GYI:[id number] DOB: Oct 20, 1927 DOA: 02/09/2016  PCP: Kathlene November, MD   Patient coming from: Home  Chief Complaint: Fatigue, malaise, wt loss, N/V/D  HPI: Derrick Fry is a 82 y.o. male with medical history significant for hypertension, hyperlipidemia, coronary artery disease, OSA, and recently diagnosed DVT now on Xarelto who presents to the emergency department with nausea, vomiting, diarrhea, dyspnea, and a generalized malaise. Patient reports that he has actually experienced a dramatic decline in his health over the past 6-8 months, has lost 20 pounds over this interval, and has been generally fatigued. He denies any recent fevers or chills and denies significant cough. There has been no recent chest pain or palpitations. Patient denies any fall or trauma, and aside from the addition of Xarelto, no recent medication change. Appetite is been poor for several months, but recently worse. He developed nonbloody diarrhea 3 days ago, but this has steadily improved and he only had one loose stool today. He continues to have nausea, but has not vomited in the past day. He was noted to have a small drop in his hemoglobin since starting Xarelto and his PCP has referred him to GI for evaluation. There has been no melena or hematochezia. Patient lives at home and family is having increasing difficulty caring for him.  ED Course: Upon arrival to the ED, patient is found to be afebrile, saturating adequately on room air, and with vitals otherwise stable. EKG features a sinus tachycardia with rate 108, PVCs, PACs, LAFB, and LVH with repolarization abnormality. Chemistry panel features a sodium of 133, chloride 100, and elevated BUN to creatinine ratio. Albumin is noted to be 2.7. CBC features a stable macrocytic anemia with hemoglobin of 11.4 and MCV of 103. Urinalysis is unremarkable and troponin is undetectable. CTA PE study was obtained and notable for small bilateral PEs without  right heart strain. CT of the abdomen and pelvis is largely unremarkable. Patient was given a 500 mL bolus of normal saline and his tachycardia has resolved. Patient remains hemodynamically stable but there is considerable sinus arrhythmia on the monitor. Patient will be observed on the telemetry unit for ongoing evaluation and management of adult failure to thrive with newly identified PE and cardiac arrhythmias.  Review of Systems:  All other systems reviewed and apart from HPI, are negative.  Past Medical History:  Diagnosis Date  . AAA (abdominal aortic aneurysm) ([place])   . CAD (coronary artery disease)    MI 08-04-85  . Complication of anesthesia   . Depression   . Diabetes mellitus with neuropathy ([place])   . Gait abnormality    chronic imbalance  . Gout    diskitis 10/2008, Dr Ouida Sills  . Hyperlipidemia   . Hypertension   . OSA (obstructive sleep apnea)    Limited CPAP tolerance  . Osteoarthritis   . Peripheral neuropathy ([place])   . Pneumonia    had right pneumonia pleurisy requiring resection of ribs and chest tube drainage at age 18  . Renal insufficiency    chronic w/ solitary kidney, congenital  . Small bowel obstruction 04/01/2012    Past Surgical History:  Procedure Laterality Date  . ABDOMINAL AORTIC ANEURYSM REPAIR  remote   w/ iliac aneurysm repair in the 1990's  . ABDOMINAL AORTIC ANEURYSM REPAIR    . CARDIOVASCULAR STRESS TEST  10/13/2009   EF 57%  . CATARACT EXTRACTION  09/1999,04/2003   rt,left  . CORONARY ARTERY BYPASS GRAFT  01/04/86  .  INGUINAL HERNIA REPAIR  03/07/83  . Knuckles replaced  05/2005   left hand  . NEPHRECTOMY  1996  . TOTAL KNEE ARTHROPLASTY  05/04/89   left  . US ECHOCARDIOGRAPHY  01/14/2007   EF 55-60%     reports that he quit smoking about 43 years ago. He has never used smokeless tobacco. He reports that he does not drink alcohol or use drugs.  Allergies  Allergen Reactions  . Colchicine     Unknown     Family History  Problem  Relation Age of Onset  . Lymphoma Sister   . Colon cancer Neg Hx   . Prostate cancer Neg Hx      Prior to Admission medications   Medication Sig Start Date End Date Taking? Authorizing Provider  atorvastatin (LIPITOR) 80 MG tablet Take 1 tablet (80 mg total) by mouth daily. 12/02/15  Yes Colon Branch, MD  cyanocobalamin 100 MCG tablet Take 100 mcg by mouth daily.    Yes Historical Provider, MD  febuxostat (ULORIC) 40 MG tablet Take 40 mg by mouth daily.    Yes Historical Provider, MD  fish oil-omega-3 fatty acids 1000 MG capsule Take 2 g by mouth daily.    Yes Historical Provider, MD  Flaxseed, Linseed, (FLAXSEED OIL) 1000 MG CAPS Take 1,000 mg by mouth 2 (two) times daily.    Yes Historical Provider, MD  folic acid (FOLVITE) 1 MG tablet Take 1 mg by mouth daily.   Yes Historical Provider, MD  furosemide (LASIX) 40 MG tablet Take 1 tablet (40 mg total) by mouth daily. 08/23/15  Yes Colon Branch, MD  gabapentin (NEURONTIN) 400 MG capsule Take 2 capsules (800 mg total) by mouth 3 (three) times daily. 09/13/15  Yes Colon Branch, MD  glucosamine-chondroitin 500-400 MG tablet Take 1 tablet by mouth 3 (three) times daily.    Yes Historical Provider, MD  glucose blood (ONE TOUCH ULTRA TEST) test strip Check blood sugar no more than twice daily. 07/20/14  Yes Colon Branch, MD  Lancets [facility] ULTRASOFT) lancets Check blood sugar no more than twice daily. 07/20/14  Yes Colon Branch, MD  methotrexate (RHEUMATREX) 2.5 MG tablet Take 15 mg by mouth once a week. Caution:Chemotherapy. Protect from light. Every Monday   Yes Historical Provider, MD  Multiple Vitamins-Minerals (CENTRUM SILVER) tablet Take 1 tablet by mouth daily.    Yes Historical Provider, MD  niacin 500 MG tablet Take 500 mg by mouth at bedtime.    Yes Historical Provider, MD  nitroGLYCERIN (NITROSTAT) 0.4 MG SL tablet Place 1 tablet (0.4 mg total) under the tongue every 5 (five) minutes as needed for chest pain. 08/05/15  Yes Thayer Headings, MD    oxyCODONE-acetaminophen (PERCOCET/ROXICET) 5-325 MG tablet Take 1 tablet by mouth 2 (two) times daily as needed for severe pain. 01/21/16  Yes Colon Branch, MD  polyethylene glycol [facility] / GLYCOLAX) packet Take 17 g by mouth as needed for mild constipation or moderate constipation.    Yes Historical Provider, MD  predniSONE (DELTASONE) 5 MG tablet Take 2.5 mg by mouth daily.    Yes Historical Provider, MD  rivaroxaban (XARELTO) 20 MG TABS tablet Take 1 tablet (20 mg total) by mouth daily with supper. 01/18/16  Yes Colon Branch, MD    Physical Exam: Vitals:   02/09/16 1745 02/09/16 1900 02/09/16 1930 02/09/16 2000  BP:    105/84  Pulse: 91 94 86 91  Resp: 14 15 11  17  Temp:      TempSrc:      SpO2: 96% 97% 100% (!) 87%  Weight:      Height:          Constitutional: NAD, calm, comfortable, frail  Eyes: PERTLA, lids and conjunctivae normal ENMT: Mucous membranes are dry. Posterior pharynx clear of any exudate or lesions.   Neck: normal, supple, no masses, no thyromegaly Respiratory: clear to auscultation bilaterally, no wheezing, no crackles. Normal respiratory effort.  Cardiovascular: Rate ~100 and irregular. No extremity edema. No significant JVD. Abdomen: No distension, no tenderness, no masses palpated. Bowel sounds normal.  Musculoskeletal: no clubbing / cyanosis. No joint deformity upper and lower extremities.    Skin: no significant rashes, lesions, ulcers. Warm, dry, well-perfused. Neurologic: CN 2-12 grossly intact. Sensation intact, DTR normal. Strength 5/5 in all 4 limbs.  Psychiatric: Normal judgment and insight. Alert and oriented x 3. Normal mood and affect.     Labs on Admission: I have personally reviewed following labs and imaging studies  CBC:  Recent Labs Lab 02/07/16 1527 02/09/16 1211  WBC 9.1 9.3  NEUTROABS 7.7  --   HGB 11.8* 11.4*  HCT 35.4* 34.4*  MCV 105.5* 103.0*  PLT 174.0 263   Basic Metabolic Panel:  Recent Labs Lab 02/07/16 1527  02/09/16 1211  NA 136 133*  K 4.6 4.3  CL 98 100*  CO2 26 26  GLUCOSE 148* 242*  BUN 26* 22*  CREATININE 1.17 1.06  CALCIUM 10.4 9.8   GFR: Estimated Creatinine Clearance: 46.8 mL/min (by C-G formula based on SCr of 1.06 mg/dL). Liver Function Tests:  Recent Labs Lab 02/07/16 1527 02/09/16 1211  AST 52* 56*  ALT 36 35  ALKPHOS 73 67  BILITOT 0.6 0.6  PROT 6.7 5.7*  ALBUMIN 3.7 2.7*    Recent Labs Lab 02/09/16 1211  LIPASE 23   No results for input(s): AMMONIA in the last 168 hours. Coagulation Profile: No results for input(s): INR, PROTIME in the last 168 hours. Cardiac Enzymes: No results for input(s): CKTOTAL, CKMB, CKMBINDEX, TROPONINI in the last 168 hours. BNP (last 3 results) No results for input(s): PROBNP in the last 8760 hours. HbA1C: No results for input(s): HGBA1C in the last 72 hours. CBG: No results for input(s): GLUCAP in the last 168 hours. Lipid Profile: No results for input(s): CHOL, HDL, LDLCALC, TRIG, CHOLHDL, LDLDIRECT in the last 72 hours. Thyroid Function Tests:  Recent Labs  02/07/16 1527  TSH 0.94   Anemia Panel:  Recent Labs  02/07/16 1527  VITAMINB12 736  FOLATE >23.4  IRON 34*  RETICCTPCT 2.5   Urine analysis:    Component Value Date/Time   COLORURINE YELLOW 02/09/2016 1459   APPEARANCEUR CLEAR 02/09/2016 1459   LABSPEC 1.006 02/09/2016 1459   PHURINE 6.0 02/09/2016 1459   GLUCOSEU 50 (A) 02/09/2016 1459   GLUCOSEU NEGATIVE 10/21/2015 0903   HGBUR NEGATIVE 02/09/2016 1459   BILIRUBINUR NEGATIVE 02/09/2016 1459   KETONESUR NEGATIVE 02/09/2016 1459   PROTEINUR NEGATIVE 02/09/2016 1459   UROBILINOGEN 0.2 10/21/2015 0903   NITRITE NEGATIVE 02/09/2016 1459   LEUKOCYTESUR NEGATIVE 02/09/2016 1459   Sepsis Labs: @LABRCNTIP (procalcitonin:4,lacticidven:4) )No results found for this or any previous visit (from the past 240 hour(s)).   Radiological Exams on Admission: Ct Angio Chest Pe W And/or Wo Contrast  Result  Date: 02/09/2016 CLINICAL DATA:  Shortness of breath, upper abdominal pain, weakness, weight loss, DVT, history of AAA EXAM: CT ANGIOGRAPHY CHEST CT ABDOMEN AND PELVIS WITH CONTRAST  TECHNIQUE: Multidetector CT imaging of the chest was performed using the standard protocol during bolus administration of intravenous contrast. Multiplanar CT image reconstructions and MIPs were obtained to evaluate the vascular anatomy. Multidetector CT imaging of the abdomen and pelvis was performed using the standard protocol during bolus administration of intravenous contrast. CONTRAST:  100 mL Isovue 370 IV COMPARISON:  CT abdomen/pelvis dated 01/10/2013 FINDINGS: CTA CHEST FINDINGS Cardiovascular: Satisfactory opacification of the pulmonary arteries to the segmental level. Lobar pulmonary emboli in the right main pulmonary artery extending into the proximal right lower lobe pulmonary artery (series 407/ images 134, 144, and 151). Additional lobar pulmonary emboli in the left lower lobe (series 407/ image 165). Overall clot burden is small.  No evidence of right heart strain. The heart is top-normal in size.  No pericardial effusion. Atherosclerotic calcifications of the aortic arch with eccentric mural thrombus along the descending thoracic aorta. Three vessel coronary atherosclerosis. Postsurgical changes related to prior CABG. Mediastinum/Nodes: No suspicious mediastinal lymphadenopathy. Visualized thyroid is unremarkable. Lungs/Pleura: Evaluation the lung parenchyma is mildly constrained by respiratory motion. Mild patchy/ ground-glass opacities in the right upper lobe (series 406/ image 64), favoring mild infection. Mild patchy opacities in the bilateral lower lobes with associated subpleural reticulation, possibly atelectasis, superimposed interstitial lung disease not excluded. Underlying mild emphysematous changes. No pleural effusion or pneumothorax. Musculoskeletal: Mild wedging at T6. Median sternotomy with healed mid  sternal fracture deformity (sagittal image 100). Review of the MIP images confirms the above findings. CT ABDOMEN and PELVIS FINDINGS Hepatobiliary: Liver is within normal limits. Gallbladder is unremarkable. No intrahepatic or extrahepatic ductal dilatation. Pancreas: Within normal limits. Spleen: Within normal limits. Adrenals/Urinary Tract: Adrenal glands are within normal limits. Left kidney is surgically absent. Right kidney is notable for mild cortical atrophy, vascular calcifications, and at least two nonobstructing renal calculi measuring 3 mm (series 501/ images 21 and 30). Moderate extrarenal pelvis without frank hydronephrosis. No right ureteral or bladder calculi. Bladder is within normal limits. Stomach/Bowel: Stomach is within normal limits. No evidence of bowel obstruction. Appendix is not discretely visualized. Left colonic diverticulosis, without evidence of diverticulitis. Vascular/Lymphatic: No evidence abdominal aortic aneurysm. Extensive atherosclerotic calcifications abdominal aorta and branch vessels. 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 No suspicious abdominopelvic lymphadenopathy. Reproductive: Prostate is unremarkable. Other: No abdominopelvic ascites. Musculoskeletal: Degenerative changes of the visualized thoracolumbar spine. Fusion at L4-5. Review of the MIP images confirms the above findings. IMPRESSION: Lobar pulmonary emboli in the bilateral lower lobes. Overall clot burden is small. No evidence of right heart strain. Mild patchy/ground-glass opacities in the right upper lobe, favoring mild infection. No evidence of abdominal aortic aneurysm. 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 Status post left nephrectomy. Two nonobstructing right renal calculi measuring up to 3 mm. Moderate right extrarenal pelvis without frank hydronephrosis. Additional ancillary findings as above. Critical  Value/emergent results were called by telephone at the time of interpretation on 02/09/2016 at 5:16 pm to Dr. Davonna Belling , who verbally acknowledged these results. Electronically Signed   By: Julian Hy M.D.   On: 02/09/2016 17:18   Ct Abdomen Pelvis W Contrast  Result Date: 02/09/2016 CLINICAL DATA:  Shortness of breath, upper abdominal pain, weakness, weight loss, DVT, history of AAA EXAM: CT ANGIOGRAPHY CHEST CT ABDOMEN AND PELVIS WITH CONTRAST TECHNIQUE: Multidetector CT imaging of the chest was performed using the standard protocol during bolus administration of intravenous contrast. Multiplanar CT  image reconstructions and MIPs were obtained to evaluate the vascular anatomy. Multidetector CT imaging of the abdomen and pelvis was performed using the standard protocol during bolus administration of intravenous contrast. CONTRAST:  100 mL Isovue 370 IV COMPARISON:  CT abdomen/pelvis dated 01/10/2013 FINDINGS: CTA CHEST FINDINGS Cardiovascular: Satisfactory opacification of the pulmonary arteries to the segmental level. Lobar pulmonary emboli in the right main pulmonary artery extending into the proximal right lower lobe pulmonary artery (series 407/ images 134, 144, and 151). Additional lobar pulmonary emboli in the left lower lobe (series 407/ image 165). Overall clot burden is small.  No evidence of right heart strain. The heart is top-normal in size.  No pericardial effusion. Atherosclerotic calcifications of the aortic arch with eccentric mural thrombus along the descending thoracic aorta. Three vessel coronary atherosclerosis. Postsurgical changes related to prior CABG. Mediastinum/Nodes: No suspicious mediastinal lymphadenopathy. Visualized thyroid is unremarkable. Lungs/Pleura: Evaluation the lung parenchyma is mildly constrained by respiratory motion. Mild patchy/ ground-glass opacities in the right upper lobe (series 406/ image 64), favoring mild infection. Mild patchy opacities in the  bilateral lower lobes with associated subpleural reticulation, possibly atelectasis, superimposed interstitial lung disease not excluded. Underlying mild emphysematous changes. No pleural effusion or pneumothorax. Musculoskeletal: Mild wedging at T6. Median sternotomy with healed mid sternal fracture deformity (sagittal image 100). Review of the MIP images confirms the above findings. CT ABDOMEN and PELVIS FINDINGS Hepatobiliary: Liver is within normal limits. Gallbladder is unremarkable. No intrahepatic or extrahepatic ductal dilatation. Pancreas: Within normal limits. Spleen: Within normal limits. Adrenals/Urinary Tract: Adrenal glands are within normal limits. Left kidney is surgically absent. Right kidney is notable for mild cortical atrophy, vascular calcifications, and at least two nonobstructing renal calculi measuring 3 mm (series 501/ images 21 and 30). Moderate extrarenal pelvis without frank hydronephrosis. No right ureteral or bladder calculi. Bladder is within normal limits. Stomach/Bowel: Stomach is within normal limits. No evidence of bowel obstruction. Appendix is not discretely visualized. Left colonic diverticulosis, without evidence of diverticulitis. Vascular/Lymphatic: No evidence abdominal aortic aneurysm. Extensive atherosclerotic calcifications abdominal aorta and branch vessels. 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 No suspicious abdominopelvic lymphadenopathy. Reproductive: Prostate is unremarkable. Other: No abdominopelvic ascites. Musculoskeletal: Degenerative changes of the visualized thoracolumbar spine. Fusion at L4-5. Review of the MIP images confirms the above findings. IMPRESSION: Lobar pulmonary emboli in the bilateral lower lobes. Overall clot burden is small. No evidence of right heart strain. Mild patchy/ground-glass opacities in the right upper lobe, favoring mild infection. No evidence of abdominal aortic aneurysm. 2.0 cm left  common iliac artery aneurysm, previously 1.9 cm in 2014, grossly unchanged. Status post left nephrectomy. Two nonobstructing right renal calculi measuring up to 3 mm. Moderate right extrarenal pelvis without frank hydronephrosis. Additional ancillary findings as above. Critical Value/emergent results were called by telephone at the time of interpretation on 02/09/2016 at 5:16 pm to Dr. Davonna Belling , who verbally acknowledged these results. Electronically Signed   By: Julian Hy M.D.   On: 02/09/2016 17:18    EKG: Independently reviewed. Sinus tachycardia (rate 108), PVCs, PACs, LAFB, LVH with repol abnormality  Assessment/Plan  1. Failure to thrive in adult  - Pt has recent 20 lb wt loss, fatigue, and general malaise; appetite has been very poor  - CT chest/abd/pel in ED without appreciable malignancy; pt does not seem particularly depressed  - This is likely to be multifactorial, likely worsened by recent N/V/D, and possibly by small PEs - Will  check thyroid studies, B12, folate, ESR  - Dietitian and PT evals are requested    2. DVT, PE  - Pt was recently diagnosed with LLE DVT and started on Xarelto  - CTA PE study performed in ED reveals small bilateral PEs without evidence for right heart strain  - Pt reports compliance with Xarelto and the PEs were likely there prior to starting it  - Pt is oxygenating well, denies CP or palpitations, and is not in any respiratory distress - Continue Xarelto    3. CAD - There are no anginal complaints on admission; troponin is undetectable - Pt is s/p CABG 30 yrs ago and is managed on Lipitor, will continue    4. Nausea, vomiting, diarrhea  - Pt reports watery diarrhea for past 3 days, but only had 1 episode on the morning of admit and believes it has resolved  - He reports non-bloody vomiting 2 days prior to admission, but this has resolved  - There was no abdominal pain associated with this and CT abd/pelvis is without acute findings     - Pt is dehydrated on admission and will be given IVF; if diarrhea recurs, could send GI pathogen panel    5. Hyponatremia  - Serum sodium is 133 on admission in the setting of dehydration  - He was given a 500 cc NS bolus in ED and is continued on NS infusion  - Repeat chem panel in am    6. Protein-calorie malnutrition - Pt has poor appetite, reports recent 20 lb wt loss, and has serum albumin of 2.7  - Dietary consultation requested for recommendations    7. Cardiac arrhythmia  - Pt has frequent PVCs and PACs on cardiac monitoring in the ED - Potassium level is wnl, will check magnesium  - Possibly provoked by the PEs  - Monitor and replete lytes prn, watch on telemetry overnight   8. Macrocytic anemia  - Hgb is 11.4 on admission with MCV of 103.0  - Hgb is stable relative to recent priors, but MCV was normal earlier this year  - B12 and folate levels are pending  - There is no evidence of bleeding on admission     DVT prophylaxis: Xarelto  Code Status: Full  Family Communication: Wife and daughter updated at bedside Disposition Plan: Observe on telemetry Consults called: None Admission status: Observation    Vianne Bulls, MD [facility] Pager [phone]  If 7PM-7AM, please contact night-coverage [email or web address] Password [facility]  02/09/2016, 8:53 PM

## 2016-02-09 NOTE — ED Notes (Signed)
Patient transported to CT 

## 2016-02-09 NOTE — Telephone Encounter (Signed)
FYI

## 2016-02-10 ENCOUNTER — Encounter (HOSPITAL_COMMUNITY): Payer: Self-pay | Admitting: General Practice

## 2016-02-10 ENCOUNTER — Ambulatory Visit: Payer: PPO | Admitting: Internal Medicine

## 2016-02-10 ENCOUNTER — Observation Stay (HOSPITAL_BASED_OUTPATIENT_CLINIC_OR_DEPARTMENT_OTHER): Payer: PPO

## 2016-02-10 DIAGNOSIS — R627 Adult failure to thrive: Secondary | ICD-10-CM | POA: Diagnosis not present

## 2016-02-10 DIAGNOSIS — I2699 Other pulmonary embolism without acute cor pulmonale: Secondary | ICD-10-CM

## 2016-02-10 DIAGNOSIS — R197 Diarrhea, unspecified: Secondary | ICD-10-CM

## 2016-02-10 LAB — ECHOCARDIOGRAM COMPLETE
HEIGHTINCHES: 68 in
WEIGHTICAEL: 2419.2 [oz_av]

## 2016-02-10 LAB — BASIC METABOLIC PANEL
ANION GAP: 7 (ref 5–15)
BUN: 19 mg/dL (ref 6–20)
CHLORIDE: 102 mmol/L (ref 101–111)
CO2: 28 mmol/L (ref 22–32)
Calcium: 9.2 mg/dL (ref 8.9–10.3)
Creatinine, Ser: 0.99 mg/dL (ref 0.61–1.24)
GFR calc non Af Amer: >60 mL/min (ref >60)
GLUCOSE: 109 mg/dL — AB (ref 65–99)
Potassium: 4 mmol/L (ref 3.5–5.1)
Sodium: 137 mmol/L (ref 135–145)

## 2016-02-10 LAB — MAGNESIUM: Magnesium: 1.9 mg/dL (ref 1.7–2.4)

## 2016-02-10 LAB — GLUCOSE, CAPILLARY
GLUCOSE-CAPILLARY: 102 mg/dL — AB (ref 65–99)
GLUCOSE-CAPILLARY: 104 mg/dL — AB (ref 65–99)
GLUCOSE-CAPILLARY: 190 mg/dL — AB (ref 65–99)

## 2016-02-10 LAB — HIV ANTIBODY (ROUTINE TESTING W REFLEX): HIV Screen 4th Generation wRfx: NONREACTIVE

## 2016-02-10 MED ORDER — ENSURE ENLIVE PO LIQD: 237.0000 mL | Freq: Two times a day (BID) | ORAL | Status: DC

## 2016-02-10 NOTE — Evaluation (Signed)
Physical Therapy Evaluation & Discharge Patient Details Name: Derrick Fry MRN: [id number] DOB: [birth year] Today's Date: 02/10/2016   History of Present Illness  Derrick Fry is a 82 y.o. male with medical history significant for hypertension, hyperlipidemia, coronary artery disease, OSA, and recently diagnosed DVT now on Xarelto who presents to the emergency department with nausea, vomiting, diarrhea, dyspnea, and a generalized malaise.   Clinical Impression  Patient presents with mobility close to his baseline.  Feel currently cane indicated indoors as well.  HR max with ambulation 131, but down to 108 resting and pt denied symptoms.  Feel safe for d/c home when stable and no current follow up needs.  Did encourage hallway ambulation if not d/c today.     Follow Up Recommendations No PT follow up    Equipment Recommendations  None recommended by PT    Recommendations for Other Services       Precautions / Restrictions Precautions Precautions: Fall Precaution Comments: reports fall about 2 weeks ago trying to climb into bed and fell to L side, no injury      Mobility  Bed Mobility Overal bed mobility: Modified Independent                Transfers Overall transfer level: Needs assistance Equipment used: Straight cane Transfers: Sit to/from Stand Sit to Stand: Supervision         General transfer comment: for safety of environment  Ambulation/Gait Ambulation/Gait assistance: Supervision Ambulation Distance (Feet): 225 Feet Assistive device: Straight cane Gait Pattern/deviations: Step-through pattern;Decreased stride length;Trunk flexed     General Gait Details: no LOB, mildly unsteady, but able to talk and walk no LOB, no physical assist  Stairs            Wheelchair Mobility    Modified Rankin (Stroke Patients Only)       Balance Overall balance assessment: Needs assistance;History of Falls   Sitting balance-Leahy Scale: Good     Standing  balance support: Single extremity supported Standing balance-Leahy Scale: Good Standing balance comment: using cane, but able to stand unsupported and participate in reaching activities                             Pertinent Vitals/Pain Pain Assessment: No/denies pain    Home Living Family/patient expects to be discharged to:: Private residence Living Arrangements: Spouse/significant other Available Help at Discharge: Family Type of Home: House Home Access: Stairs to enter Entrance Stairs-Rails: None Technical brewer of Steps: 2 Home Layout: One level Home Equipment: Cane - single point      Prior Function Level of Independence: Independent with assistive device(s)         Comments: uses cane outside, drives, likes to [employer] with riding mower     Hand Dominance   Dominant Hand: Right    Extremity/Trunk Assessment               Lower Extremity Assessment: Overall WFL for tasks assessed         Communication   Communication: No difficulties  Cognition Arousal/Alertness: Awake/alert Behavior During Therapy: WFL for tasks assessed/performed Overall Cognitive Status: Within Functional Limits for tasks assessed                      General Comments General comments (skin integrity, edema, etc.): Discussed importance of lighting for fall prevention due to neuropathy and need to sit first on bed prior to lying  down.  Also encouraged hallway ambulation if not d/c today with nursing supervision.     Exercises     Assessment/Plan    PT Assessment Patent does not need any further PT services  PT Problem List            PT Treatment Interventions      PT Goals (Current goals can be found in the Care Plan section)  Acute Rehab PT Goals PT Goal Formulation: All assessment and education complete, DC therapy    Frequency     Barriers to discharge        Co-evaluation               End of Session Equipment Utilized During  Treatment: Gait belt Activity Tolerance: Patient tolerated treatment well Patient left: in chair;with call bell/phone within reach;with family/visitor present      Functional Assessment Tool Used: Clinical Judgement Functional Limitation: Mobility: Walking and moving around Mobility: Walking and Moving Around Current Status [id number]): At least 1 percent but less than 20 percent impaired, limited or restricted Mobility: Walking and Moving Around Goal Status [phone]): At least 1 percent but less than 20 percent impaired, limited or restricted Mobility: Walking and Moving Around Discharge Status [phone]): At least 1 percent but less than 20 percent impaired, limited or restricted    Time: 0937-0958 PT Time Calculation (min) (ACUTE ONLY): 21 min   Charges:   PT Evaluation $PT Eval Moderate Complexity: 1 Procedure     PT G Codes:   PT G-Codes **NOT FOR INPATIENT CLASS** Functional Assessment Tool Used: Clinical Judgement Functional Limitation: Mobility: Walking and moving around Mobility: Walking and Moving Around Current Status [id number]): At least 1 percent but less than 20 percent impaired, limited or restricted Mobility: Walking and Moving Around Goal Status [phone]): At least 1 percent but less than 20 percent impaired, limited or restricted Mobility: Walking and Moving Around Discharge Status [phone]): At least 1 percent but less than 20 percent impaired, limited or restricted    Reginia Naas 02/10/2016, 10:39 AM  Magda Kiel, PT [phone] 02/10/2016

## 2016-02-10 NOTE — Care Management Obs Status (Signed)
[facility] NOTIFICATION   Patient Details  Name: Derrick Fry MRN: [id number] Date of Birth: [birth year]   Medicare Observation Status Notification Given:  Yes    Dawayne Patricia, RN 02/10/2016, 3:04 PM

## 2016-02-10 NOTE — Discharge Instructions (Signed)

## 2016-02-10 NOTE — Progress Notes (Signed)
Initial Nutrition Assessment  DOCUMENTATION CODES:   Severe malnutrition in context of chronic illness  INTERVENTION:   Ensure Enlive po BID, each supplement provides 350 kcal and 20 grams of protein  Encouraged pt to continue supplements at home until appetite improves and weight is stable.   NUTRITION DIAGNOSIS:   Malnutrition (Severe) related to chronic illness as evidenced by severe depletion of muscle mass, severe depletion of body fat, energy intake < or equal to 75% for > or equal to 1 month, 17 percent weight loss x 6 months.  GOAL:   Patient will meet greater than or equal to 90% of their needs  MONITOR:   PO intake, I & O's, Weight trends  REASON FOR ASSESSMENT:   Consult Assessment of nutrition requirement/status  ASSESSMENT:   Pt with PMH significant for HTN, hyperlipidemia, coronary artery disease, OSA, and recently diagnosed DVT now on Xarelto who presents to the emergency department with nausea, vomiting, diarrhea, dyspnea, and a generalized malaise. Patient reports that he has actually experienced a dramatic decline in his health over the past 6-8 months, has lost 20 pounds over this interval, and has been generally fatigued.    Meal Completion: 75% this am Usual weight 175 lb. Pt with 14% weight loss x 6 months.  Reports poor appetite for the last 6 months but it has been worse for the last month. Pt wakes late, has no appetite when he first gets up so does not eat until 10-11 am. He eats egg with grits or cereal, usually no lunch and then dinner. Wife cooks but they do go out and eat. Fridays they go to a seafood place and split a plate.   Medications reviewed and include: folvite, MVI, vitamin B12 Labs reviewed: prealbumin 22.7 CBG's: 102-104 Nutrition-Focused physical exam completed. Findings are mild/moderate to severe fat depletion, mild/moderate to severe muscle depletion, and no edema.  RLE with more depletion. Per pt this is due to favoring this leg  due to knee troubles.     Diet Order:  Diet regular Room service appropriate? Yes; Fluid consistency: Thin  Skin:  Reviewed, no issues  Last BM:  12/6  Height:   Ht Readings from Last 1 Encounters:  02/09/16 5\' 8"  (1.727 m)    Weight:   Wt Readings from Last 1 Encounters:  02/10/16 151 lb 3.2 oz (68.6 kg)    Ideal Body Weight:  70 kg  BMI:  Body mass index is 22.99 kg/m.  Estimated Nutritional Needs:   Kcal:  1700-1900  Protein:  80-100 grams  Fluid:  > 1.7 L/day  EDUCATION NEEDS:   Education needs addressed  Maylon Peppers RD, [place], [place] Pager [phone] After Hours Pager

## 2016-02-10 NOTE — Progress Notes (Signed)
  Echocardiogram 2D Echocardiogram has been performed.  Donata Clay 02/10/2016, 1:32 PM

## 2016-02-10 NOTE — Discharge Summary (Signed)
Physician Discharge Summary  Derrick Fry [id number] DOB: [birth year] DOA: 02/09/2016  PCP: Kathlene November, MD  Admit date: 02/09/2016 Discharge date: 02/10/2016  Recommendations for Outpatient Follow-up:  1. Pt will need to follow up with PCP in 1-2 weeks post discharge 2. Please obtain BMP to evaluate electrolytes and kidney function 3. Please also check CBC to evaluate Hg and Hct levels Pt also advised to follow up with his rheumatologist Dr. Amil Amen to discuss his RA medications, not clear if potentially contributing to poor oral intake   Discharge Diagnoses:  Principal Problem:   Failure to thrive in adult Active Problems:   DM II (diabetes mellitus, type II), controlled ([place])   OSA (obstructive sleep apnea)   Essential hypertension   CAD (coronary artery disease)  Discharge Condition: Stable  Diet recommendation: Heart healthy diet discussed in details   History of present illness:  82 y.o. male with medical history significant for hypertension, RA, hyperlipidemia, coronary artery disease, OSA, and recently diagnosed DVT now on Xarelto who presented to the emergency department with nausea, vomiting, diarrhea, dyspnea, and a generalized malaise. Patient reported that he has actually experienced a dramatic decline in his health over the past 6-8 months, has lost 20 pounds over this interval, and has been generally fatigued. He denies any recent fevers or chills and denies significant cough. There has been no recent chest pain or palpitations. Patient denies any fall or trauma, and aside from the addition of Xarelto, no recent medication change. Appetite has been poor for several months, but recently worse. He developed nonbloody diarrhea 3 days ago, but this has steadily improved and he only had one loose stool today. He continues to have nausea, but has not vomited in the past day. He was noted to have a small drop in his hemoglobin since starting Xarelto and his PCP has referred him to  GI for evaluation. There has been no melena or hematochezia.  Hospital Course:    1. Failure to thrive in adult  - Pt has recent 20 lb wt loss, fatigue, and general malaise; appetite has been very poor  - CT chest/abd/pel in ED without appreciable malignancy; pt does not seem particularly depressed  - This is likely to be multifactorial, likely worsened by recent N/V/D, and possibly by small PEs, ? Side effect from Prednisone, Methotrexate  - PT consulted and had no recommendations, pt wants to go home today  - discussed with pt and wife that it would be important to follow up with Dr. Amil Amen pt's rheumatologist to discuss his medications   2. DVT, PE  - Pt was recently diagnosed with LLE DVT and started on Xarelto  - CTA PE study performed in ED reveals small bilateral PEs without evidence for right heart strain  - Pt reports compliance with Xarelto and the PEs were likely there prior to starting it  - ECHO with no evidence if heart strain  - Continue Xarelto    3. 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 Nausea, vomiting, diarrhea  - Pt reports watery diarrhea for past 3 days, but only had 1 episode on the morning of admit and believes it has resolved  - He reports non-bloody vomiting 2 days prior to admission, but this has resolved  - There was no  abdominal pain associated with this and CT abd/pelvis is without acute findings   - pt wants to go home, has tolerated lunch well   5. Hyponatremia  - Serum sodium is 133 on admission in the setting of dehydration  - improved with IVF  6. Protein-calorie malnutrition - Pt has poor appetite, reports recent 20 lb wt loss, and has serum albumin of 2.7  - Dietary consultation requested for recommendations    7. Cardiac arrhythmia  - Pt has frequent PVCs and PACs on  cardiac monitoring in the ED - ECHO stable   8. Macrocytic anemia  - Hgb is 11.4 on admission with MCV of 103.0  - Hgb is stable relative to recent priors, but MCV was normal earlier this year  - B12 and folate levels stable   DVT prophylaxis: Xarelto  Code Status: Full  Family Communication: Wife and daughter updated at bedside   Procedures/Studies: Ct Angio Chest Pe W And/or Wo Contrast  Result Date: 02/09/2016 CLINICAL DATA:  Shortness of breath, upper abdominal pain, weakness, weight loss, DVT, history of AAA EXAM: CT ANGIOGRAPHY CHEST CT ABDOMEN AND PELVIS WITH CONTRAST TECHNIQUE: Multidetector CT imaging of the chest was performed using the standard protocol during bolus administration of intravenous contrast. Multiplanar CT image reconstructions and MIPs were obtained to evaluate the vascular anatomy. Multidetector CT imaging of the abdomen and pelvis was performed using the standard protocol during bolus administration of intravenous contrast. CONTRAST:  100 mL Isovue 370 IV COMPARISON:  CT abdomen/pelvis dated 01/10/2013 FINDINGS: CTA CHEST FINDINGS Cardiovascular: Satisfactory opacification of the pulmonary arteries to the segmental level. Lobar pulmonary emboli in the right main pulmonary artery extending into the proximal right lower lobe pulmonary artery (series 407/ images 134, 144, and 151). Additional lobar pulmonary emboli in the left lower lobe (series 407/ image 165). Overall clot burden is small.  No evidence of right heart strain. The heart is top-normal in size.  No pericardial effusion. Atherosclerotic calcifications of the aortic arch with eccentric mural thrombus along the descending thoracic aorta. Three vessel coronary atherosclerosis. Postsurgical changes related to prior CABG. Mediastinum/Nodes: No suspicious mediastinal lymphadenopathy. Visualized thyroid is unremarkable. Lungs/Pleura: Evaluation the lung parenchyma is mildly constrained by respiratory motion. Mild  patchy/ ground-glass opacities in the right upper lobe (series 406/ image 64), favoring mild infection. Mild patchy opacities in the bilateral lower lobes with associated subpleural reticulation, possibly atelectasis, superimposed interstitial lung disease not excluded. Underlying mild emphysematous changes. No pleural effusion or pneumothorax. Musculoskeletal: Mild wedging at T6. Median sternotomy with healed mid sternal fracture deformity (sagittal image 100). Review of the MIP images confirms the above findings. CT ABDOMEN and PELVIS FINDINGS Hepatobiliary: Liver is within normal limits. Gallbladder is unremarkable. No intrahepatic or extrahepatic ductal dilatation. Pancreas: Within normal limits. Spleen: Within normal limits. Adrenals/Urinary Tract: Adrenal glands are within normal limits. Left kidney is surgically absent. Right kidney is notable for mild cortical atrophy, vascular calcifications, and at least two nonobstructing renal calculi measuring 3 mm (series 501/ images 21 and 30). Moderate extrarenal pelvis without frank hydronephrosis. No right ureteral or bladder calculi. Bladder is within normal limits. Stomach/Bowel: Stomach is within normal limits. No evidence of bowel obstruction. Appendix is not discretely visualized. Left colonic diverticulosis, without evidence of diverticulitis. Vascular/Lymphatic: No evidence abdominal aortic aneurysm. Extensive atherosclerotic calcifications abdominal aorta and branch vessels. 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 No suspicious abdominopelvic lymphadenopathy. Reproductive: Prostate is unremarkable. Other: No abdominopelvic ascites. Musculoskeletal:  Degenerative changes of the visualized thoracolumbar spine. Fusion at L4-5. Review of the MIP images confirms the above findings. IMPRESSION: Lobar pulmonary emboli in the bilateral lower lobes. Overall clot burden is small. No evidence of right heart strain. Mild  patchy/ground-glass opacities in the right upper lobe, favoring mild infection. No evidence of abdominal aortic aneurysm. 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 Status post left nephrectomy. Two nonobstructing right renal calculi measuring up to 3 mm. Moderate right extrarenal pelvis without frank hydronephrosis. Additional ancillary findings as above. Critical Value/emergent results were called by telephone at the time of interpretation on 02/09/2016 at 5:16 pm to Dr. Davonna Belling , who verbally acknowledged these results. Electronically Signed   By: Julian Hy M.D.   On: 02/09/2016 17:18   Ct Abdomen Pelvis W Contrast  Result Date: 02/09/2016 CLINICAL DATA:  Shortness of breath, upper abdominal pain, weakness, weight loss, DVT, history of AAA EXAM: CT ANGIOGRAPHY CHEST CT ABDOMEN AND PELVIS WITH CONTRAST TECHNIQUE: Multidetector CT imaging of the chest was performed using the standard protocol during bolus administration of intravenous contrast. Multiplanar CT image reconstructions and MIPs were obtained to evaluate the vascular anatomy. Multidetector CT imaging of the abdomen and pelvis was performed using the standard protocol during bolus administration of intravenous contrast. CONTRAST:  100 mL Isovue 370 IV COMPARISON:  CT abdomen/pelvis dated 01/10/2013 FINDINGS: CTA CHEST FINDINGS Cardiovascular: Satisfactory opacification of the pulmonary arteries to the segmental level. Lobar pulmonary emboli in the right main pulmonary artery extending into the proximal right lower lobe pulmonary artery (series 407/ images 134, 144, and 151). Additional lobar pulmonary emboli in the left lower lobe (series 407/ image 165). Overall clot burden is small.  No evidence of right heart strain. The heart is top-normal in size.  No pericardial effusion. Atherosclerotic calcifications of the aortic arch with eccentric mural thrombus along the descending thoracic  aorta. Three vessel coronary atherosclerosis. Postsurgical changes related to prior CABG. Mediastinum/Nodes: No suspicious mediastinal lymphadenopathy. Visualized thyroid is unremarkable. Lungs/Pleura: Evaluation the lung parenchyma is mildly constrained by respiratory motion. Mild patchy/ ground-glass opacities in the right upper lobe (series 406/ image 64), favoring mild infection. Mild patchy opacities in the bilateral lower lobes with associated subpleural reticulation, possibly atelectasis, superimposed interstitial lung disease not excluded. Underlying mild emphysematous changes. No pleural effusion or pneumothorax. Musculoskeletal: Mild wedging at T6. Median sternotomy with healed mid sternal fracture deformity (sagittal image 100). Review of the MIP images confirms the above findings. CT ABDOMEN and PELVIS FINDINGS Hepatobiliary: Liver is within normal limits. Gallbladder is unremarkable. No intrahepatic or extrahepatic ductal dilatation. Pancreas: Within normal limits. Spleen: Within normal limits. Adrenals/Urinary Tract: Adrenal glands are within normal limits. Left kidney is surgically absent. Right kidney is notable for mild cortical atrophy, vascular calcifications, and at least two nonobstructing renal calculi measuring 3 mm (series 501/ images 21 and 30). Moderate extrarenal pelvis without frank hydronephrosis. No right ureteral or bladder calculi. Bladder is within normal limits. Stomach/Bowel: Stomach is within normal limits. No evidence of bowel obstruction. Appendix is not discretely visualized. Left colonic diverticulosis, without evidence of diverticulitis. Vascular/Lymphatic: No evidence abdominal aortic aneurysm. Extensive atherosclerotic calcifications abdominal aorta and branch vessels. 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 No suspicious abdominopelvic lymphadenopathy. Reproductive: Prostate is unremarkable. Other: No abdominopelvic ascites.  Musculoskeletal: Degenerative changes of the visualized thoracolumbar spine. Fusion at L4-5. Review of the MIP images confirms the above findings. IMPRESSION:  Lobar pulmonary emboli in the bilateral lower lobes. Overall clot burden is small. No evidence of right heart strain. Mild patchy/ground-glass opacities in the right upper lobe, favoring mild infection. No evidence of abdominal aortic aneurysm. 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 Status post left nephrectomy. Two nonobstructing right renal calculi measuring up to 3 mm. Moderate right extrarenal pelvis without frank hydronephrosis. Additional ancillary findings as above. Critical Value/emergent results were called by telephone at the time of interpretation on 02/09/2016 at 5:16 pm to Dr. Davonna Belling , who verbally acknowledged these results. Electronically Signed   By: Julian Hy M.D.   On: 02/09/2016 17:18   US Venous Img Lower Unilateral Left  Result Date: 01/18/2016 CLINICAL DATA:  Left lower extremity swelling and pain 3 months. EXAM: LEFT LOWER EXTREMITY VENOUS DOPPLER ULTRASOUND TECHNIQUE: Gray-scale sonography with graded compression, as well as color Doppler and duplex ultrasound were performed to evaluate the lower extremity deep venous systems from the level of the common femoral vein and including the common femoral, femoral, profunda femoral, popliteal and calf veins including the posterior tibial, peroneal and gastrocnemius veins when visible. The superficial great saphenous vein was also interrogated. Spectral Doppler was utilized to evaluate flow at rest and with distal augmentation maneuvers in the common femoral, femoral and popliteal veins. COMPARISON:  None. FINDINGS: Contralateral Common Femoral Vein: Respiratory phasicity is normal and symmetric with the symptomatic side. No evidence of thrombus. Normal compressibility. Common Femoral Vein: Hypoechoic intraluminal thrombus  appearing nearly occlusive. Vessel is noncompressible. Saphenofemoral Junction: Femoral thrombus extends into the saphenous femoral junction appearing occlusive. Vessel is noncompressible. Profunda Femoral Vein: No evidence of thrombus. Normal compressibility and flow on color Doppler imaging. Femoral Vein: Diffuse hypoechoic intraluminal thrombus appearing occlusive. Vessel is noncompressible. No flow detected. Popliteal Vein: Similar hypoechoic intraluminal thrombus appearing occlusive. Vessel is noncompressible. No significant flow. Calf Veins: Limited assessment of the calf veins. Superficial Great Saphenous Vein: Superficial thrombosis noted proximally in the upper thigh. This does not appear to extend below the knee. Venous Reflux:  None. Other Findings: Ankle subcutaneous edema noted. Large popliteal fossa Baker cyst measures 9 x 3.3 x 5 cm IMPRESSION: Positive exam for occlusive left femoral popliteal DVT as above. Femoral thrombus also extends into the saphenous femoral junction and proximal GSV demonstrating superficial thrombosis in the thigh region but not below the knee Ankle subcutaneous edema These results will be called to the ordering clinician or representative by the Radiologist Assistant, and communication documented in the PACS or zVision Dashboard. Electronically Signed   By: Jerilynn Mages.  Shick M.D.   On: 01/18/2016 15:52     Discharge Exam: Vitals:   02/10/16 0518 02/10/16 1347  BP: (!) 97/50 132/60  Pulse: 72 86  Resp: 20 18  Temp: 98.5 F (36.9 C) 98.2 F (36.8 C)   Vitals:   02/09/16 2100 02/09/16 2128 02/10/16 0518 02/10/16 1347  BP:   (!) 97/50 132/60  Pulse:   72 86  Resp:   20 18  Temp: 98.8 F (37.1 C)  98.5 F (36.9 C) 98.2 F (36.8 C)  TempSrc: Oral  Oral Oral  SpO2:   98% 96%  Weight:  68.5 kg (151 lb) 68.6 kg (151 lb 3.2 oz)   Height:  5\' 8"  (1.727 m)      General: Pt is alert, follows commands appropriately, not in acute distress Cardiovascular: Regular rate  and rhythm, S1/S2 +, no rubs, no gallops Respiratory: Clear to  auscultation bilaterally, no wheezing, no crackles, no rhonchi Abdominal: Soft, non tender, non distended, bowel sounds +, no guarding  Discharge Instructions  Discharge Instructions    Diet - low sodium heart healthy    Complete by:  As directed    Increase activity slowly    Complete by:  As directed        Medication List    TAKE these medications   atorvastatin 80 MG tablet Commonly known as:  LIPITOR Take 1 tablet (80 mg total) by mouth daily.   CENTRUM SILVER tablet Take 1 tablet by mouth daily.   cyanocobalamin 100 MCG tablet Take 100 mcg by mouth daily.   fish oil-omega-3 fatty acids 1000 MG capsule Take 2 g by mouth daily.   Flaxseed Oil 1000 MG Caps Take 1,000 mg by mouth 2 (two) times daily.   folic acid 1 MG tablet Commonly known as:  FOLVITE Take 1 mg by mouth daily.   furosemide 40 MG tablet Commonly known as:  LASIX Take 1 tablet (40 mg total) by mouth daily.   gabapentin 400 MG capsule Commonly known as:  NEURONTIN Take 2 capsules (800 mg total) by mouth 3 (three) times daily.   glucosamine-chondroitin 500-400 MG tablet Take 1 tablet by mouth 3 (three) times daily.   glucose blood test strip Commonly known as:  ONE TOUCH ULTRA TEST Check blood sugar no more than twice daily.   methotrexate 2.5 MG tablet Commonly known as:  RHEUMATREX Take 15 mg by mouth once a week. Caution:Chemotherapy. Protect from light. Every Monday   niacin 500 MG tablet Take 500 mg by mouth at bedtime.   nitroGLYCERIN 0.4 MG SL tablet Commonly known as:  NITROSTAT Place 1 tablet (0.4 mg total) under the tongue every 5 (five) minutes as needed for chest pain.   onetouch ultrasoft lancets Check blood sugar no more than twice daily.   oxyCODONE-acetaminophen 5-325 MG tablet Commonly known as:  PERCOCET/ROXICET Take 1 tablet by mouth 2 (two) times daily as needed for severe pain.   polyethylene glycol  packet Commonly known as:  MIRALAX / GLYCOLAX Take 17 g by mouth as needed for mild constipation or moderate constipation.   predniSONE 5 MG tablet Commonly known as:  DELTASONE Take 2.5 mg by mouth daily.   rivaroxaban 20 MG Tabs tablet Commonly known as:  XARELTO Take 1 tablet (20 mg total) by mouth daily with supper.   ULORIC 40 MG tablet Generic drug:  febuxostat Take 40 mg by mouth daily.      [place], MD. Schedule an appointment as soon as possible for a visit.   Specialty:  Internal Medicine Contact information: [address] [place] [address] [phone]        Faye Ramsay, MD Follow up.   Specialty:  Internal Medicine Why:  call my cell phone with any questions please [phone] Contact information: [address] [place] [address] [phone]            The results of significant diagnostics from this hospitalization (including imaging, microbiology, ancillary and laboratory) are listed below for reference.     Microbiology: No results found for this or any previous visit (from the past 240 hour(s)).   Labs: Basic Metabolic Panel:  Recent Labs Lab 02/07/16 1527 02/09/16 1211 02/09/16 2121 02/10/16 0241  NA 136 133*  --  137  K 4.6 4.3  --  4.0  CL 98  100*  --  102  CO2 26 26  --  28  GLUCOSE 148* 242*  --  109*  BUN 26* 22*  --  19  CREATININE 1.17 1.06  --  0.99  CALCIUM 10.4 9.8  --  9.2  MG  --   --  1.8 1.9   Liver Function Tests:  Recent Labs Lab 02/07/16 1527 02/09/16 1211  AST 52* 56*  ALT 36 35  ALKPHOS 73 67  BILITOT 0.6 0.6  PROT 6.7 5.7*  ALBUMIN 3.7 2.7*    Recent Labs Lab 02/09/16 1211  LIPASE 23   CBC:  Recent Labs Lab 02/07/16 1527 02/09/16 1211  WBC 9.1 9.3  NEUTROABS 7.7  --   HGB 11.8* 11.4*  HCT 35.4* 34.4*  MCV 105.5* 103.0*  PLT 174.0 160   CBG:  Recent Labs Lab 02/10/16 0624 02/10/16 1141 02/10/16 1620   GLUCAP 102* 104* 190*   SIGNED: Time coordinating discharge: 30 minutes  Faye Ramsay, MD  [facility] 02/10/2016, 5:34 PM Pager [phone]  If 7PM-7AM, please contact night-coverage [email or web address] Password [email or web address]

## 2016-02-10 NOTE — Progress Notes (Signed)
Patient discharged teaching given to patient and his wife including activity, diet, follow-up appointments and medication. Patient verbalized understanding of all discharge instructions. IV access was dc'd. Vitals are stable. Skin is intact.

## 2016-02-11 LAB — FOLATE RBC
FOLATE, HEMOLYSATE: 595.9 ng/mL
Folate, RBC: 1758 ng/mL (ref >498)
HEMATOCRIT: 33.9 % — AB (ref 37.5–51.0)

## 2016-02-14 ENCOUNTER — Telehealth: Payer: Self-pay | Admitting: Behavioral Health

## 2016-02-14 ENCOUNTER — Telehealth: Payer: Self-pay | Admitting: Internal Medicine

## 2016-02-14 ENCOUNTER — Encounter: Payer: Self-pay | Admitting: Family Medicine

## 2016-02-14 ENCOUNTER — Ambulatory Visit (INDEPENDENT_AMBULATORY_CARE_PROVIDER_SITE_OTHER): Payer: PPO | Admitting: Family Medicine

## 2016-02-14 VITALS — BP 138/60 | HR 83 | Temp 98.5°F | Ht 68.0 in | Wt 151.4 lb

## 2016-02-14 DIAGNOSIS — R197 Diarrhea, unspecified: Secondary | ICD-10-CM

## 2016-02-14 DIAGNOSIS — R110 Nausea: Secondary | ICD-10-CM

## 2016-02-14 DIAGNOSIS — Z09 Encounter for follow-up examination after completed treatment for conditions other than malignant neoplasm: Secondary | ICD-10-CM | POA: Diagnosis not present

## 2016-02-14 DIAGNOSIS — R1013 Epigastric pain: Secondary | ICD-10-CM

## 2016-02-14 LAB — BASIC METABOLIC PANEL
BUN: 23 mg/dL (ref 6–23)
CALCIUM: 9.4 mg/dL (ref 8.4–10.5)
CO2: 25 mEq/L (ref 19–32)
Chloride: 96 mEq/L (ref 96–112)
Creatinine, Ser: 1.31 mg/dL (ref 0.40–1.50)
GFR: 55.03 mL/min — AB (ref >60.00)
GLUCOSE: 88 mg/dL (ref 70–99)
POTASSIUM: 4 meq/L (ref 3.5–5.1)
SODIUM: 133 meq/L — AB (ref 135–145)

## 2016-02-14 LAB — CBC
HCT: 35.5 % — ABNORMAL LOW (ref 39.0–52.0)
HEMOGLOBIN: 11.8 g/dL — AB (ref 13.0–17.0)
MCHC: 33.3 g/dL (ref 30.0–36.0)
MCV: 102.5 fl — ABNORMAL HIGH (ref 78.0–100.0)
PLATELETS: 200 10*3/uL (ref 150.0–400.0)
RBC: 3.46 Mil/uL — ABNORMAL LOW (ref 4.22–5.81)
RDW: 18.5 % — AB (ref 11.5–15.5)
WBC: 10 10*3/uL (ref 4.0–10.5)

## 2016-02-14 MED ORDER — ONDANSETRON HCL 4 MG PO TABS: 4.0000 mg | ORAL_TABLET | Freq: Three times a day (TID) | ORAL | 0 refills | Status: DC | PRN

## 2016-02-14 NOTE — Progress Notes (Signed)
Chief Complaint  Patient presents with  . Weight Loss    Subjective: Patient is a 83 y.o. male here for weight loss.  8 days of diarrhea, nausea, and abdominal pain. He went to the emergency department and was admitted for a pulmonary embolism. CT of the emergency department was unremarkable for anything GI related. Every time he eats or drinks, he has epigastric pain, nausea, and diarrhea. He denies any bleeding, dark tarry stools, injury, fevers, recent travel, or medication changes. He denies any nighttime awakenings. He does note that he had a 10 pound unintentional weight loss over the past 3 months. A GI referral was placed and is scheduled on 1/22. He states this is too far away.  ROS: Const: no fevers Lungs: No SOB GI: as noted in HPI  Family History  Problem Relation Age of Onset  . Lymphoma Sister   . Colon cancer Neg Hx   . Prostate cancer Neg Hx    Past Medical History:  Diagnosis Date  . AAA (abdominal aortic aneurysm) ([place])   . Anemia   . Arthritis    "all over" (02/10/2016)  . Basal cell carcinoma of face    "burned off" (02/10/2016)  . CAD (coronary artery disease)    MI 08[birth year]04[birth year]85  . Chronic lower back pain   . Diabetes mellitus with neuropathy ([place])    "borderline" (02/10/2016)  . DVT (deep venous thrombosis) ([place]) 02/2016   "left thigh"  . Gait abnormality    chronic imbalance  . Gout    diskitis 10/2008, Dr Ouida Sills  . Heart murmur dx'd 02/2016  . Hyperlipidemia   . Hypertension   . Myocardial infarction 1987   "before OHS"  . OSA (obstructive sleep apnea)    Limited CPAP tolerance (02/10/2016)  . Osteoarthritis   . Peripheral neuropathy ([place])   . Pneumonia 1938   had right pneumonia pleurisy requiring resection of ribs and chest tube drainage at age 28  . Pulmonary embolism ([place]) 02/2016   "left"  . Renal insufficiency    chronic w/ solitary kidney, congenital  . Small bowel obstruction 04/01/2012   Allergies  Allergen Reactions  . Colchicine      Unknown     Current Outpatient Prescriptions:  .  atorvastatin (LIPITOR) 80 MG tablet, Take 1 tablet (80 mg total) by mouth daily., Disp: 90 tablet, Rfl: 2 .  cyanocobalamin 100 MCG tablet, Take 100 mcg by mouth daily. , Disp: , Rfl:  .  febuxostat (ULORIC) 40 MG tablet, Take 40 mg by mouth daily. , Disp: , Rfl:  .  fish oil-omega-3 fatty acids 1000 MG capsule, Take 2 g by mouth daily. , Disp: , Rfl:  .  Flaxseed, Linseed, (FLAXSEED OIL) 1000 MG CAPS, Take 1,000 mg by mouth 2 (two) times daily. , Disp: , Rfl:  .  folic acid (FOLVITE) 1 MG tablet, Take 1 mg by mouth daily., Disp: , Rfl:  .  furosemide (LASIX) 40 MG tablet, Take 1 tablet (40 mg total) by mouth daily., Disp: 90 tablet, Rfl: 2 .  gabapentin (NEURONTIN) 400 MG capsule, Take 2 capsules (800 mg total) by mouth 3 (three) times daily., Disp: 180 capsule, Rfl: 5 .  glucosamine-chondroitin 500-400 MG tablet, Take 1 tablet by mouth 3 (three) times daily. , Disp: , Rfl:  .  glucose blood (ONE TOUCH ULTRA TEST) test strip, Check blood sugar no more than twice daily., Disp: 100 each, Rfl: 11 .  Lancets (ONETOUCH ULTRASOFT) lancets, Check blood sugar no more  than twice daily., Disp: 100 each, Rfl: 11 .  methotrexate (RHEUMATREX) 2.5 MG tablet, Take 15 mg by mouth once a week. Caution:Chemotherapy. Protect from light. Every Monday, Disp: , Rfl:  .  Multiple Vitamins-Minerals (CENTRUM SILVER) tablet, Take 1 tablet by mouth daily. , Disp: , Rfl:  .  niacin 500 MG tablet, Take 500 mg by mouth at bedtime. , Disp: , Rfl:  .  nitroGLYCERIN (NITROSTAT) 0.4 MG SL tablet, Place 1 tablet (0.4 mg total) under the tongue every 5 (five) minutes as needed for chest pain., Disp: 25 tablet, Rfl: 6 .  oxyCODONE-acetaminophen (PERCOCET/ROXICET) 5-325 MG tablet, Take 1 tablet by mouth 2 (two) times daily as needed for severe pain., Disp: 60 tablet, Rfl: 0 .  polyethylene glycol (MIRALAX / GLYCOLAX) packet, Take 17 g by mouth as needed for mild constipation or  moderate constipation. , Disp: , Rfl:  .  predniSONE (DELTASONE) 5 MG tablet, Take 2.5 mg by mouth daily. , Disp: , Rfl:  .  rivaroxaban (XARELTO) 20 MG TABS tablet, Take 1 tablet (20 mg total) by mouth daily with supper., Disp: 30 tablet, Rfl: 2 .  ondansetron (ZOFRAN) 4 MG tablet, Take 1 tablet (4 mg total) by mouth every 8 (eight) hours as needed for nausea or vomiting., Disp: 20 tablet, Rfl: 0  Objective: BP 138/60   Pulse 83   Temp 98.5 F (36.9 C) (Oral)   Ht 5\' 8"  (1.727 m)   Wt 151 lb 6.4 oz (68.7 kg)   SpO2 96%   BMI 23.02 kg/m  General: Awake, appears stated age HEENT: PERRLA, no scleral icterus, MMM, no mucosal ulcerations Heart: RRR, no murmurs, no LE edema, no bruits Lungs: CTAB, no rales, wheezes or rhonchi. No accessory muscle use Abd: BS+, soft, mild TTP in suprapubic region, ND, no masses or organomegaly Psych: Age appropriate judgment and insight, normal affect and mood  Assessment and Plan: Diarrhea, unspecified type - Plan: Stool Culture, Ova and parasite examination  Epigastric pain  Nausea without vomiting - Plan: ondansetron (ZOFRAN) 4 MG tablet  Hospital discharge follow-up - Plan: Basic Metabolic Panel (BMET), CBC  Orders as above. I will get the ball rolling with a workup for loose stool to r/o infectious diarrhea. Viral etiology is a possibility given acute presentation. No red flag signs regarding this 8-day issue.  Try to eat and drink. Drink Ensure/protein shakes. He has not lost weight since his hospital discharge. Recommended both he and his wife reach out to GI to see if he can be seen sooner. They may not since his main issue for referral appears to be anemia. F/u with Dr. Larose Kells as originally scheduled tomorrow to see if he has other input. The patient voiced understanding and agreement to the plan.  [place], DO 02/14/16  12:27 PM

## 2016-02-14 NOTE — Telephone Encounter (Signed)
Patient's wife called stating that she was able to get patient in to see gastro tomorrow at 2:30pm, they had a cancellation. She just wanted to let Dr. Nani Ravens know.

## 2016-02-14 NOTE — Patient Instructions (Addendum)
I want you to drink calorie dense protein shakes.  Try Metamucil twice daily to help bulk up your stools.  It may be reasonable to reach out to your GI provider to see if you can be seen sooner.  Don't take MiraLax when you have having loose stools.

## 2016-02-14 NOTE — Telephone Encounter (Signed)
Transition Care Management Follow-up Telephone Call  PCP: Kathlene November, MD  Admit date: 02/09/2016 Discharge date: 02/10/2016  Recommendations for Outpatient Follow-up:  1. Pt will need to follow up with PCP in 1-2 weeks post discharge 2. Please obtain BMP to evaluate electrolytes and kidney function 3. Please also check CBC to evaluate Hg and Hct levels Pt also advised to follow up with his rheumatologist Dr. Amil Amen to discuss his RA medications, not clear if potentially contributing to poor oral intake   Discharge Diagnoses:  Principal Problem:   Failure to thrive in adult Active Problems:   DM II (diabetes mellitus, type II), controlled ([place])   OSA (obstructive sleep apnea)   Essential hypertension   CAD (coronary artery disease)  Discharge Condition: Stable   How have you been since you were released from the hospital? Patient stated, that he's still unable to hold food on his stomach".   Do you understand why you were in the hospital? yes   Do you understand the discharge instructions? yes   Where were you discharged to? Home with wife.   Items Reviewed:  Medications reviewed: yes  Allergies reviewed: yes  Dietary changes reviewed: yes, patient reported that he's currently on a clear liquid diet and still having trouble keeping down all foods/liquids.  Referrals reviewed: yes, Pt also advised to follow up with his rheumatologist Dr. Amil Amen to discuss his RA medications, not clear if potentially contributing to poor oral intake    Functional Questionnaire:   Activities of Daily Living (ADLs):   He states they are independent in the following: bathing and hygiene, feeding, continence, grooming, toileting and dressing States they require assistance with the following: ambulation; pt. is ambulating with wheelchair.   Any transportation issues/concerns?: no   Any patient concerns? yes, still having trouble keeping down all foods/liquids.    Confirmed  importance and date/time of follow-up visits scheduled yes, 02/15/16 at 1:00 PM.  Provider Appointment booked with Dr. Larose Kells.  Confirmed with patient if condition begins to worsen call PCP or go to the ER.  Patient was given the office number and encouraged to call back with question or concerns.  : yes

## 2016-02-14 NOTE — Progress Notes (Signed)
Pre visit review using our clinic tool,if applicable. No additional management support is needed unless otherwise documented below in the visit note.  

## 2016-02-15 ENCOUNTER — Ambulatory Visit (INDEPENDENT_AMBULATORY_CARE_PROVIDER_SITE_OTHER): Payer: PPO | Admitting: Gastroenterology

## 2016-02-15 ENCOUNTER — Encounter: Payer: Self-pay | Admitting: Internal Medicine

## 2016-02-15 ENCOUNTER — Ambulatory Visit (INDEPENDENT_AMBULATORY_CARE_PROVIDER_SITE_OTHER): Payer: PPO | Admitting: Internal Medicine

## 2016-02-15 ENCOUNTER — Other Ambulatory Visit (INDEPENDENT_AMBULATORY_CARE_PROVIDER_SITE_OTHER): Payer: PPO

## 2016-02-15 ENCOUNTER — Encounter: Payer: Self-pay | Admitting: Gastroenterology

## 2016-02-15 VITALS — BP 126/50 | HR 72 | Ht 68.0 in | Wt 148.0 lb

## 2016-02-15 VITALS — BP 128/72 | HR 58 | Temp 97.8°F | Resp 14 | Ht 68.0 in | Wt 151.4 lb

## 2016-02-15 DIAGNOSIS — R197 Diarrhea, unspecified: Secondary | ICD-10-CM

## 2016-02-15 DIAGNOSIS — Z09 Encounter for follow-up examination after completed treatment for conditions other than malignant neoplasm: Secondary | ICD-10-CM

## 2016-02-15 DIAGNOSIS — R627 Adult failure to thrive: Secondary | ICD-10-CM | POA: Diagnosis not present

## 2016-02-15 DIAGNOSIS — R5383 Other fatigue: Secondary | ICD-10-CM

## 2016-02-15 DIAGNOSIS — R110 Nausea: Secondary | ICD-10-CM

## 2016-02-15 DIAGNOSIS — R1013 Epigastric pain: Secondary | ICD-10-CM | POA: Diagnosis not present

## 2016-02-15 DIAGNOSIS — R634 Abnormal weight loss: Secondary | ICD-10-CM

## 2016-02-15 DIAGNOSIS — R109 Unspecified abdominal pain: Secondary | ICD-10-CM

## 2016-02-15 LAB — CBC WITH DIFFERENTIAL/PLATELET
BASOS ABS: 0 10*3/uL (ref 0.0–0.1)
BASOS PCT: 0.4 % (ref 0.0–3.0)
EOS ABS: 0 10*3/uL (ref 0.0–0.7)
Eosinophils Relative: 0.3 % (ref 0.0–5.0)
HEMATOCRIT: 34.8 % — AB (ref 39.0–52.0)
HEMOGLOBIN: 11.6 g/dL — AB (ref 13.0–17.0)
LYMPHS PCT: 19.6 % (ref 12.0–46.0)
Lymphs Abs: 1.7 10*3/uL (ref 0.7–4.0)
MCHC: 33.5 g/dL (ref 30.0–36.0)
MCV: 100.8 fl — ABNORMAL HIGH (ref 78.0–100.0)
Monocytes Absolute: 1.2 10*3/uL — ABNORMAL HIGH (ref 0.1–1.0)
Monocytes Relative: 13.5 % — ABNORMAL HIGH (ref 3.0–12.0)
Neutro Abs: 5.9 10*3/uL (ref 1.4–7.7)
Neutrophils Relative %: 66.2 % (ref 43.0–77.0)
Platelets: 220 10*3/uL (ref 150.0–400.0)
RBC: 3.45 Mil/uL — AB (ref 4.22–5.81)
RDW: 18.2 % — ABNORMAL HIGH (ref 11.5–15.5)
WBC: 8.9 10*3/uL (ref 4.0–10.5)

## 2016-02-15 LAB — COMPREHENSIVE METABOLIC PANEL
ALBUMIN: 3.6 g/dL (ref 3.5–5.2)
ALT: 44 U/L (ref 0–53)
AST: 78 U/L — AB (ref 0–37)
Alkaline Phosphatase: 72 U/L (ref 39–117)
BILIRUBIN TOTAL: 0.8 mg/dL (ref 0.2–1.2)
BUN: 29 mg/dL — ABNORMAL HIGH (ref 6–23)
CALCIUM: 9.5 mg/dL (ref 8.4–10.5)
CO2: 26 mEq/L (ref 19–32)
CREATININE: 1.42 mg/dL (ref 0.40–1.50)
Chloride: 96 mEq/L (ref 96–112)
GFR: 50.14 mL/min — ABNORMAL LOW (ref >60.00)
Glucose, Bld: 120 mg/dL — ABNORMAL HIGH (ref 70–99)
Potassium: 4 mEq/L (ref 3.5–5.1)
Sodium: 135 mEq/L (ref 135–145)
TOTAL PROTEIN: 6.6 g/dL (ref 6.0–8.3)

## 2016-02-15 LAB — OVA AND PARASITE EXAMINATION: OP: NONE SEEN

## 2016-02-15 MED ORDER — OMEPRAZOLE 40 MG PO CPDR: 40.0000 mg | DELAYED_RELEASE_CAPSULE | Freq: Every day | ORAL | 3 refills | Status: DC

## 2016-02-15 NOTE — Telephone Encounter (Signed)
FYI only. TL/CMA

## 2016-02-15 NOTE — Patient Instructions (Addendum)
Will need to have C. Diff (for toxin and PCR)  You will have labs checked today in the basement lab.  Please head down after you check out with the front desk  (cbc, cmet). May need a colonoscopy +/- upper endoscopy pending the results above. Will start omeprazole 40mg  one pill before breakfast once daily.

## 2016-02-15 NOTE — Progress Notes (Signed)
Pre visit review using our clinic review tool, if applicable. No additional management support is needed unless otherwise documented below in the visit note. 

## 2016-02-15 NOTE — Progress Notes (Signed)
HPI: This is a  very pleasant 82 year old man  who was referred to me by Colon Branch, MD  to evaluate  postprandial abdominal pain, diarrhea .    Chief complaint is postprandial abdominal pain, diarrhea  Ct scan abd/pelvis with IV contrast last week was essentially normal.  CBC yesterday and last week Hb 11.4, MCV 103 Sed rate last week 57  AST slightly elevated.  Started on xarelto recently for DVT to small PE  Having trouble eating.  Having a lot of diarrhea.  Water will go down, ensure hurts to eat.  This problem started 2 weeks ago.    He has pain after eating or drinking just about anything.    He has lost 30-40 pounds since the spring, more rapidly recently.  No NSAIDs  A lot of diarrhea as well; non bloody.  Sometimes at night.  He was on antibiotics 4-5 weeks ago.  No colon cancer in his family.  Review of systems: Pertinent positive and negative review of systems were noted in the above HPI section. Complete review of systems was performed and was otherwise normal.   Past Medical History:  Diagnosis Date  . AAA (abdominal aortic aneurysm) ([place])   . Anemia   . Arthritis    "all over" (02/10/2016)  . Basal cell carcinoma of face    "burned off" (02/10/2016)  . CAD (coronary artery disease)    MI 08-04[birth year]85  . Chronic lower back pain   . Diabetes mellitus with neuropathy ([place])    "borderline" (02/10/2016)  . DVT (deep venous thrombosis) ([place]) 02/2016   "left thigh"  . Gait abnormality    chronic imbalance  . Gout    diskitis 10/2008, Dr Ouida Sills  . Heart murmur dx'd 02/2016  . Hyperlipidemia   . Hypertension   . Myocardial infarction 1987   "before OHS"  . OSA (obstructive sleep apnea)    Limited CPAP tolerance (02/10/2016)  . Osteoarthritis   . Peripheral neuropathy ([place])   . Pneumonia 1938   had right pneumonia pleurisy requiring resection of ribs and chest tube drainage at age 24  . Pulmonary embolism ([place]) 02/2016   "left"  . Renal insufficiency     chronic w/ solitary kidney, congenital  . Small bowel obstruction 04/01/2012    Past Surgical History:  Procedure Laterality Date  . ABDOMINAL AORTIC ANEURYSM REPAIR  ~ 1996   w/ iliac aneurysm repair i  . APPENDECTOMY    . CARDIAC CATHETERIZATION  1987   "before OHS"  . CARDIOVASCULAR STRESS TEST  10/13/2009   EF 57%  . CATARACT EXTRACTION W/ INTRAOCULAR LENS  IMPLANT, BILATERAL  09/1999,04/2003   right,left  . CORONARY ARTERY BYPASS GRAFT  01/04/1986   CABG X5  . INGUINAL HERNIA REPAIR Left 03/07/1983  . JOINT REPLACEMENT    . Knuckles replaced  05/2005   left hand  . NEPHRECTOMY Left 1996  . TONSILLECTOMY    . TOTAL KNEE ARTHROPLASTY Left 05/04/1989  . US ECHOCARDIOGRAPHY  01/14/2007   EF 55-60%    Current Outpatient Prescriptions  Medication Sig Dispense Refill  . atorvastatin (LIPITOR) 80 MG tablet Take 1 tablet (80 mg total) by mouth daily. 90 tablet 2  . cyanocobalamin 100 MCG tablet Take 100 mcg by mouth daily.     . febuxostat (ULORIC) 40 MG tablet Take 40 mg by mouth daily.     . fish oil-omega-3 fatty acids 1000 MG capsule Take 2 g by mouth daily.     Marland Kitchen  Flaxseed, Linseed, (FLAXSEED OIL) 1000 MG CAPS Take 1,000 mg by mouth 2 (two) times daily.     . folic acid (FOLVITE) 1 MG tablet Take 1 mg by mouth daily.    . furosemide (LASIX) 40 MG tablet Take 1 tablet (40 mg total) by mouth daily. 90 tablet 2  . gabapentin (NEURONTIN) 400 MG capsule Take 2 capsules (800 mg total) by mouth 3 (three) times daily. 180 capsule 5  . glucosamine-chondroitin 500-400 MG tablet Take 1 tablet by mouth 3 (three) times daily.     Marland Kitchen glucose blood (ONE TOUCH ULTRA TEST) test strip Check blood sugar no more than twice daily. 100 each 11  . Lancets (ONETOUCH ULTRASOFT) lancets Check blood sugar no more than twice daily. 100 each 11  . methotrexate (RHEUMATREX) 2.5 MG tablet Take 15 mg by mouth once a week. Caution:Chemotherapy. Protect from light. Every Monday    . Multiple Vitamins-Minerals  (CENTRUM SILVER) tablet Take 1 tablet by mouth daily.     . niacin 500 MG tablet Take 500 mg by mouth at bedtime.     . nitroGLYCERIN (NITROSTAT) 0.4 MG SL tablet Place 1 tablet (0.4 mg total) under the tongue every 5 (five) minutes as needed for chest pain. 25 tablet 6  . ondansetron (ZOFRAN) 4 MG tablet Take 1 tablet (4 mg total) by mouth every 8 (eight) hours as needed for nausea or vomiting. 20 tablet 0  . oxyCODONE-acetaminophen (PERCOCET/ROXICET) 5-325 MG tablet Take 1 tablet by mouth 2 (two) times daily as needed for severe pain. 60 tablet 0  . polyethylene glycol (MIRALAX / GLYCOLAX) packet Take 17 g by mouth as needed for mild constipation or moderate constipation.     . predniSONE (DELTASONE) 5 MG tablet Take 2.5 mg by mouth daily.     . rivaroxaban (XARELTO) 20 MG TABS tablet Take 1 tablet (20 mg total) by mouth daily with supper. 30 tablet 2   No current facility-administered medications for this visit.     Allergies as of 02/15/2016 - Review Complete 02/15/2016  Allergen Reaction Noted  . Colchicine  10/13/2009    Family History  Problem Relation Age of Onset  . Lymphoma Sister   . Liver cancer Brother   . Lung cancer Brother   . Ulcerative colitis Brother   . Heart disease Brother   . Diabetes Brother   . Colon cancer Neg Hx   . Prostate cancer Neg Hx   . Stomach cancer Neg Hx   . Esophageal cancer Neg Hx   . Rectal cancer Neg Hx     Social History   Social History  . Marital status: Married    Spouse name: N/A  . Number of children: 2  . Years of education: N/A   Occupational History  . retired Retired   Social History Main Topics  . Smoking status: Former Smoker    Packs/day: 1.00    Years: 30.00    Types: Cigarettes    Quit date: 04/02/1972  . Smokeless tobacco: Never Used  . Alcohol use No     Comment: former heavy alcohol use  . Drug use: No  . Sexual activity: No   Other Topics Concern  . Not on file   Social History Narrative   Lost a son     Lives at home with wife    Still drives      Physical Exam: BP (!) 126/50   Pulse 72   Ht 5\' 8"  (1.727 m)  Wt 148 lb (67.1 kg)   BMI 22.50 kg/m  Constitutional: generally well-appearing Psychiatric: alert and oriented x3 Eyes: extraocular movements intact Mouth: oral pharynx moist, no lesions Neck: supple no lymphadenopathy Cardiovascular: heart regular rate and rhythm Lungs: clear to auscultation bilaterally Abdomen: soft, nontender, nondistended, no obvious ascites, no peritoneal signs, normal bowel sounds Extremities: no lower extremity edema bilaterally Skin: no lesions on visible extremities   Assessment and plan: 82 y.o. male with  significant postprandial abdominal pain, diarrhea  He has been losing significant weight over the past several months. He has pain after he eats just about anything. He is also very bothered by diarrhea if he eats anything. Sometimes he will have to get up at night with diarrhea. The diarrhea has been going on for at least several weeks. Possibly this started around the time of some antibiotics for some upper respiratory infection. He has stool testing pending and we are going to ask that he have C. difficile testing on this as well or submitted new sample if that cannot be done. Clostridium difficile infection can cause significant lower GI symptoms as well as upper GI symptoms in some patients. If the C. difficile testing is positive then treated with vancomycin and observe if his lower and upper GI symptoms improved. If the C. difficile testing is negative and no other obvious abnormalities in basic repeat blood testing including CBC, complete about profile that I think he will probably need an upper endoscopy plus minus colonoscopy in the near future. I'm empirically starting him on proton pump inhibitor once daily as well.   Owens Loffler, MD [facility] 02/15/2016, 2:50 PM  Cc: Colon Branch, MD

## 2016-02-15 NOTE — Patient Instructions (Addendum)
See your gastroenterologist today   Come  back in 2 weeks  Call if severe abdominal pain, fever, blood in the stools.

## 2016-02-15 NOTE — Progress Notes (Signed)
Subjective:    Patient ID: Derrick Fry, male    DOB: 15-Oct-1927, 82 y.o.   MRN: [id number]  DOS:  02/15/2016 Type of visit - description : TCM  Admitted to the hospital 02/09/2016, discharged the next day. DDX was failure to thrive He presented with nausea, vomiting, diarrhea, difficulty breathing, malaise, weight loss, poor appetite.. Prior to the admission anemia was noted. Workup including a CT of the chest abdomen and pelvis, no findings c/w malignancy. They did notice B small PEs. PT consulted--- no recommendations for out pt PT Echo was a stable   Wt Readings from Last 3 Encounters:  02/15/16 148 lb (67.1 kg)  02/15/16 151 lb 6 oz (68.7 kg)  02/14/16 151 lb 6.4 oz (68.7 kg)    Review of Systems Since he left the hospital he feels about the same. Has consistent postprandial abdominal pain for a few hours, some nausea but no associated vomiting, not feeling bloated. Denies heartburn, dysphagia and odynophagia. Not taking NSAIDs, takes prednisone. He has diarrhea described as watery dark stools without blood. This is also postprandial. Currently with no chest pain difficulty breathing or lower extremity edema. No fever or chills. No major problems with headaches or myalgias   Past Medical History:  Diagnosis Date  . AAA (abdominal aortic aneurysm) ([place])   . Anemia   . Arthritis    "all over" (02/10/2016)  . Basal cell carcinoma of face    "burned off" (02/10/2016)  . CAD (coronary artery disease)    MI 08-04-85  . Chronic lower back pain   . Diabetes mellitus with neuropathy ([place])    "borderline" (02/10/2016)  . DVT (deep venous thrombosis) ([employer]) 02/2016   "left thigh"  . Gait abnormality    chronic imbalance  . Gout    diskitis 10/2008, Dr Ouida Sills  . Heart murmur dx'd 02/2016  . Hyperlipidemia   . Hypertension   . Myocardial infarction 1987   "before OHS"  . OSA (obstructive sleep apnea)    Limited CPAP tolerance (02/10/2016)  . Osteoarthritis   . Peripheral  neuropathy ([place])   . Pneumonia 1938   had right pneumonia pleurisy requiring resection of ribs and chest tube drainage at age 44  . Pulmonary embolism ([place]) 02/2016   "left"  . Renal insufficiency    chronic w/ solitary kidney, congenital  . Small bowel obstruction 04/01/2012    Past Surgical History:  Procedure Laterality Date  . ABDOMINAL AORTIC ANEURYSM REPAIR  ~ 1996   w/ iliac aneurysm repair i  . APPENDECTOMY    . CARDIAC CATHETERIZATION  1987   "before [facility]"  . CARDIOVASCULAR STRESS TEST  10/13/2009   EF 57%  . CATARACT EXTRACTION W/ INTRAOCULAR LENS  IMPLANT, BILATERAL  09/1999,04/2003   right,left  . CORONARY ARTERY BYPASS GRAFT  01/04/1986   CABG X5  . INGUINAL HERNIA REPAIR Left 03/07/1983  . JOINT REPLACEMENT    . Knuckles replaced  05/2005   left hand  . NEPHRECTOMY Left 1996  . TONSILLECTOMY    . TOTAL KNEE ARTHROPLASTY Left 05/04/1989  . US ECHOCARDIOGRAPHY  01/14/2007   EF 55-60%    Social History   Social History  . Marital status: Married    Spouse name: N/A  . Number of children: 2  . Years of education: N/A   Occupational History  . retired Retired   Social History Main Topics  . Smoking status: Former Smoker    Packs/day: 1.00    Years: 30.00  Types: Cigarettes    Quit date: 04/02/1972  . Smokeless tobacco: Never Used  . Alcohol use No     Comment: former heavy alcohol use  . Drug use: No  . Sexual activity: No   Other Topics Concern  . Not on file   Social History Narrative   Lost a son    Lives at home with wife    Still drives         Medication List       Accurate as of 02/15/16  7:21 PM. Always use your most recent med list.          atorvastatin 80 MG tablet Commonly known as:  LIPITOR Take 1 tablet (80 mg total) by mouth daily.   CENTRUM SILVER tablet Take 1 tablet by mouth daily.   cyanocobalamin 100 MCG tablet Take 100 mcg by mouth daily.   fish oil-omega-3 fatty acids 1000 MG capsule Take 2 g by mouth  daily.   Flaxseed Oil 1000 MG Caps Take 1,000 mg by mouth 2 (two) times daily.   folic acid 1 MG tablet Commonly known as:  FOLVITE Take 1 mg by mouth daily.   furosemide 40 MG tablet Commonly known as:  LASIX Take 1 tablet (40 mg total) by mouth daily.   gabapentin 400 MG capsule Commonly known as:  NEURONTIN Take 2 capsules (800 mg total) by mouth 3 (three) times daily.   glucosamine-chondroitin 500-400 MG tablet Take 1 tablet by mouth 3 (three) times daily.   glucose blood test strip Commonly known as:  ONE TOUCH ULTRA TEST Check blood sugar no more than twice daily.   methotrexate 2.5 MG tablet Commonly known as:  RHEUMATREX Take 15 mg by mouth once a week. Caution:Chemotherapy. Protect from light. Every Monday   niacin 500 MG tablet Take 500 mg by mouth at bedtime.   nitroGLYCERIN 0.4 MG SL tablet Commonly known as:  NITROSTAT Place 1 tablet (0.4 mg total) under the tongue every 5 (five) minutes as needed for chest pain.   omeprazole 40 MG capsule Commonly known as:  PRILOSEC Take 1 capsule (40 mg total) by mouth daily.   ondansetron 4 MG tablet Commonly known as:  ZOFRAN Take 1 tablet (4 mg total) by mouth every 8 (eight) hours as needed for nausea or vomiting.   onetouch ultrasoft lancets Check blood sugar no more than twice daily.   oxyCODONE-acetaminophen 5-325 MG tablet Commonly known as:  PERCOCET/ROXICET Take 1 tablet by mouth 2 (two) times daily as needed for severe pain.   polyethylene glycol packet Commonly known as:  MIRALAX / GLYCOLAX Take 17 g by mouth as needed for mild constipation or moderate constipation.   predniSONE 5 MG tablet Commonly known as:  DELTASONE Take 2.5 mg by mouth daily.   rivaroxaban 20 MG Tabs tablet Commonly known as:  XARELTO Take 1 tablet (20 mg total) by mouth daily with supper.   ULORIC 40 MG tablet Generic drug:  febuxostat Take 40 mg by mouth daily.          Objective:   Physical Exam BP 128/72 (BP  Location: Left Arm, Patient Position: Sitting, Cuff Size: Normal)   Pulse (!) 58   Temp 97.8 F (36.6 C) (Oral)   Resp 14   Ht 5\' 8"  (1.727 m)   Wt 151 lb 6 oz (68.7 kg)   SpO2 93%   BMI 23.02 kg/m  General:   Well developed, underweight from baseline, no distress.  HEENT:  Normocephalic . Face symmetric, atraumatic Lungs:  Dry crackles at bases Normal respiratory effort, no intercostal retractions, no accessory muscle use. Heart: RRR,  no murmur.  no pretibial edema bilaterally  Abdomen:  Not distended, soft, non-tender. No rebound or rigidity. Proximal aorta at the epigastrium seem pulsatile, slightly enlarged on exam. No bruit.  Lower extremities: Good femoral pulses, slightly decreased pedal pulses. No edema. Skin: Not pale. Not jaundice Neurologic:  alert & oriented X3.  Speech normal, gait appropriate for age and unassisted Psych--  Cognition and judgment appear intact.  Cooperative with normal attention span and concentration.  Behavior appropriate. No anxious or depressed appearing.    Assessment & Plan:   Assessment  DM with neuropathy-CRI HTN Hyperlipidemia CRI congenital solitary kidney CAD MI 1987 AAA s/p repair Pulmonary fibrosis, noted in a CT, no previous eval by pulm, Chronic pulmonary crackles at bases. MSK: Dr Amil Amen  ---PSORIATIC arthritis on MTX ---DJD ---GOUTt --diskitis 2010, on prednisone ---Pain mngmt: on oxycodone d/t neuropathy, UDS 10-2014 low risk  -- DEXA 08-2013 wnl OSA Limited CPAP tolerance Pneumonia, R side at age 23 , pleurisy, chest tube, rib surgery Anisocoria  (R pupil larger) L LEG DVT 01-2016 [facility]  PLAN:  Failure to thrive: characterized   by malaise, weight loss (wt 175 lb June 2017) and  more recently postprandial abdominal pain. Other relevant information includes:  Preserve renal function, mild anemia with normal WBCs, normal TSH, [id number] and folic acid. Dx w/ DVT ~ 01-18-2016, stated xarelto, 2 small PEs  on CT Recent CT of the abdomen showed no aneurysm. At this point, my impression is that at least some of his symptoms are probably related with GI angina. He has diarrhea, stool studies pending noting that he was prescribed antibiotics 01/17/2016. Will try to add a C. difficile testing Plan: See GI today as planned, continue Xarelto, follow-up in 2 weeks.

## 2016-02-16 ENCOUNTER — Telehealth: Payer: Self-pay | Admitting: Gastroenterology

## 2016-02-16 ENCOUNTER — Other Ambulatory Visit: Payer: Self-pay

## 2016-02-16 ENCOUNTER — Other Ambulatory Visit: Payer: PPO

## 2016-02-16 DIAGNOSIS — G8929 Other chronic pain: Secondary | ICD-10-CM

## 2016-02-16 DIAGNOSIS — R634 Abnormal weight loss: Secondary | ICD-10-CM

## 2016-02-16 DIAGNOSIS — R197 Diarrhea, unspecified: Secondary | ICD-10-CM

## 2016-02-16 DIAGNOSIS — R1013 Epigastric pain: Principal | ICD-10-CM

## 2016-02-16 LAB — C. DIFFICILE GDH AND TOXIN A/B
C. DIFF TOXIN A/B: NOT DETECTED
C. DIFFICILE GDH: NOT DETECTED

## 2016-02-16 NOTE — Telephone Encounter (Signed)
Milus Banister, MD  Barron Alvine, RN          Please call the patient. The bloodwork was essentially unchanged from the past few weeks. C. Diff testing on the stool was negative. I'd like to do EGD for him next week (during hosp week, at [facility] as outpatient). He can stay on his bloodthinner for that. Will decide about colonoscopy pending the results of that EGD. Thanks

## 2016-02-16 NOTE — Telephone Encounter (Signed)
DAVIER DOHMEN                          [id number] [birth year]  Procedure Date: 02/22/16 Arrival Time: 630 am Procedure Time: 75 am  Procedure [facility]    The following packet contains information regarding your prep instructions, what to expect on the day of your procedure, follow up care, our cancellation policy, financial information, your rights and responsibilities as a patient, the complaint/grievance process, advance directives, and a list of frequently asked questions.  ______________________________________________________________________  PREPARATION FOR ENDOSCOPY  On 02/22/16 THE DAY OF THE PROCEDURE:  1. No solid foods, milk or milk products are allowed after midnight the night before your procedure.  2. Do not drink anything colored red or purple. Avoid juices with pulp. No orange juice.  3. You may drink clear liquids until 415 am, which is 4 hours before your procedure.  Clear liquids include: water, ice, tea/coffee (sugar is ok, but no milk or cream), juice (apple, white grape, white cranberry), clear bouillon, consomme, broth, strained chicken noodle soup, jello, popsicles, powdered fruit flavored drinks, gatorade, lemonade, carbonated beverages, hard candy.    OTHER PROCEDURE INSTRUCTIONS MEDICATIONS    Unless you are otherwise instructed, you should take your regular prescription medications with a small sip of water as early as possibly on the morning of your procedure.  Take allowed medicines by 415 am  Pt has been given the instructions and will call if he worsens prior to that appt, or go to the ED for eval if over night or the weekend

## 2016-02-17 ENCOUNTER — Telehealth: Payer: Self-pay | Admitting: Gastroenterology

## 2016-02-17 LAB — CLOSTRIDIUM DIFFICILE BY PCR: CDIFFPCR: NOT DETECTED

## 2016-02-18 ENCOUNTER — Telehealth: Payer: Self-pay | Admitting: Internal Medicine

## 2016-02-18 ENCOUNTER — Encounter (HOSPITAL_COMMUNITY): Payer: Self-pay | Admitting: Emergency Medicine

## 2016-02-18 ENCOUNTER — Emergency Department (HOSPITAL_COMMUNITY)
Admission: EM | Admit: 2016-02-18 | Discharge: 2016-02-18 | Disposition: A | Discharge status: Home / Self Care | Payer: PPO | Attending: Emergency Medicine | Admitting: Emergency Medicine

## 2016-02-18 DIAGNOSIS — I252 Old myocardial infarction: Secondary | ICD-10-CM | POA: Diagnosis not present

## 2016-02-18 DIAGNOSIS — Z79899 Other long term (current) drug therapy: Secondary | ICD-10-CM | POA: Diagnosis not present

## 2016-02-18 DIAGNOSIS — R1013 Epigastric pain: Secondary | ICD-10-CM | POA: Diagnosis not present

## 2016-02-18 DIAGNOSIS — E1140 Type 2 diabetes mellitus with diabetic neuropathy, unspecified: Secondary | ICD-10-CM | POA: Insufficient documentation

## 2016-02-18 DIAGNOSIS — Z7901 Long term (current) use of anticoagulants: Secondary | ICD-10-CM | POA: Insufficient documentation

## 2016-02-18 DIAGNOSIS — I10 Essential (primary) hypertension: Secondary | ICD-10-CM | POA: Insufficient documentation

## 2016-02-18 DIAGNOSIS — Z96652 Presence of left artificial knee joint: Secondary | ICD-10-CM | POA: Insufficient documentation

## 2016-02-18 DIAGNOSIS — E860 Dehydration: Secondary | ICD-10-CM

## 2016-02-18 DIAGNOSIS — Z87891 Personal history of nicotine dependence: Secondary | ICD-10-CM | POA: Diagnosis not present

## 2016-02-18 DIAGNOSIS — I2510 Atherosclerotic heart disease of native coronary artery without angina pectoris: Secondary | ICD-10-CM | POA: Diagnosis not present

## 2016-02-18 DIAGNOSIS — Z955 Presence of coronary angioplasty implant and graft: Secondary | ICD-10-CM | POA: Diagnosis not present

## 2016-02-18 DIAGNOSIS — R404 Transient alteration of awareness: Secondary | ICD-10-CM | POA: Diagnosis not present

## 2016-02-18 DIAGNOSIS — R531 Weakness: Secondary | ICD-10-CM | POA: Diagnosis not present

## 2016-02-18 LAB — URINALYSIS, ROUTINE W REFLEX MICROSCOPIC
Bilirubin Urine: NEGATIVE
Glucose, UA: NEGATIVE mg/dL
Hgb urine dipstick: NEGATIVE
Ketones, ur: 5 mg/dL — AB
LEUKOCYTES UA: NEGATIVE
NITRITE: NEGATIVE
Protein, ur: NEGATIVE mg/dL
SPECIFIC GRAVITY, URINE: 1.005 (ref 1.005–1.030)
pH: 6 (ref 5.0–8.0)

## 2016-02-18 LAB — CBC WITH DIFFERENTIAL/PLATELET
Basophils Absolute: 0.1 10*3/uL (ref 0.0–0.1)
Basophils Relative: 1 %
EOS ABS: 0 10*3/uL (ref 0.0–0.7)
EOS PCT: 1 %
HCT: 31.2 % — ABNORMAL LOW (ref 39.0–52.0)
Hemoglobin: 10.8 g/dL — ABNORMAL LOW (ref 13.0–17.0)
LYMPHS ABS: 1.8 10*3/uL (ref 0.7–4.0)
Lymphocytes Relative: 23 %
MCH: 34.6 pg — AB (ref 26.0–34.0)
MCHC: 34.6 g/dL (ref 30.0–36.0)
MCV: 100 fL (ref 78.0–100.0)
Monocytes Absolute: 1 10*3/uL (ref 0.1–1.0)
Monocytes Relative: 13 %
Neutro Abs: 4.8 10*3/uL (ref 1.7–7.7)
Neutrophils Relative %: 62 %
PLATELETS: 196 10*3/uL (ref 150–400)
RBC: 3.12 MIL/uL — AB (ref 4.22–5.81)
RDW: 16.8 % — ABNORMAL HIGH (ref 11.5–15.5)
WBC: 7.6 10*3/uL (ref 4.0–10.5)

## 2016-02-18 LAB — COMPREHENSIVE METABOLIC PANEL
ALT: 34 U/L (ref 17–63)
ANION GAP: 9 (ref 5–15)
AST: 55 U/L — ABNORMAL HIGH (ref 15–41)
Albumin: 2.8 g/dL — ABNORMAL LOW (ref 3.5–5.0)
Alkaline Phosphatase: 61 U/L (ref 38–126)
BUN: 30 mg/dL — ABNORMAL HIGH (ref 6–20)
CHLORIDE: 99 mmol/L — AB (ref 101–111)
CO2: 25 mmol/L (ref 22–32)
Calcium: 8.7 mg/dL — ABNORMAL LOW (ref 8.9–10.3)
Creatinine, Ser: 1.48 mg/dL — ABNORMAL HIGH (ref 0.61–1.24)
GFR calc non Af Amer: 41 mL/min — ABNORMAL LOW (ref >60)
GFR, EST AFRICAN AMERICAN: 48 mL/min — AB (ref >60)
Glucose, Bld: 116 mg/dL — ABNORMAL HIGH (ref 65–99)
POTASSIUM: 3.6 mmol/L (ref 3.5–5.1)
SODIUM: 133 mmol/L — AB (ref 135–145)
Total Bilirubin: 1 mg/dL (ref 0.3–1.2)
Total Protein: 6 g/dL — ABNORMAL LOW (ref 6.5–8.1)

## 2016-02-18 LAB — MAGNESIUM: MAGNESIUM: 1.9 mg/dL (ref 1.7–2.4)

## 2016-02-18 LAB — I-STAT CG4 LACTIC ACID, ED: LACTIC ACID, VENOUS: 1.22 mmol/L (ref 0.5–1.9)

## 2016-02-18 LAB — STOOL CULTURE

## 2016-02-18 LAB — LIPASE, BLOOD: Lipase: 41 U/L (ref 11–51)

## 2016-02-18 MED ORDER — SUCRALFATE 1 G PO TABS: 1.0000 g | ORAL_TABLET | Freq: Three times a day (TID) | ORAL | 0 refills | Status: DC

## 2016-02-18 MED ORDER — SODIUM CHLORIDE 0.9 % IV BOLUS (SEPSIS)
1000.0000 mL | Freq: Once | INTRAVENOUS | Status: AC
  Administered 2016-02-18: 1000 mL via INTRAVENOUS

## 2016-02-18 NOTE — ED Triage Notes (Signed)
Per EMS-abdominal pain for over 3 weeks-states he has seen 2 different specialists for symptoms-states pain after eating today and generalized weakness that followed--has lost 30 lbs since the summer

## 2016-02-18 NOTE — ED Notes (Signed)
Pt denies vomiting or diarrhea in a week. Pt reports severe nausea and annoying upper abdominal pain post eating. ENDO appointment scheduled for this upcoming Tuesday.

## 2016-02-18 NOTE — Telephone Encounter (Signed)
Patient wife calling back regarding this. °

## 2016-02-18 NOTE — Telephone Encounter (Signed)
Called to follow up with patient. Wife is really worried about her husband.  Stating he's not the same. Very weak.  He's lost so much weight (over 30 lbs) and the only thing his stomach can tolerate is water and ice.  The moment he tries to eat anything else he becomes nauseated, stomach cramps and diarrhea starts.  Wife says this has been going on for about 5 weeks now.  Multiple tests have been performed and nothing is explaining why patient feels the way he does.  Wife says pt has a EGD scheduled for Tuesday, December 19th and patient told her he just doesn't think he will make it until then.  He feels so weak. Wife says pt has not experienced any nausea or diarrhea today, because he is only taking in water and ice.  Wife fears that even after patient has EGD done, it won't show anything either.  Wife says she's wondering if the Xarelto maybe causing the patient's diarrhea.  She says she was reviewing the side effects associated with this medication and on the list of less common experienced side effects were nausea, vomiting, stomach cramps and diarrhea.  Wife wants to know should patient continue to take medication.  Wife states he will run out of his samples this Sunday.  She denies any signs of bleeding.    Spoke to patient.  He also says he's very weak.  Doesn't want to eat anything other than ice and water.  Wife states last meal was maybe 2-3 weeks ago.    Given patient and wife's report of patient feeling very weak.  Pt was advised to go to th ED.  Pt states he doesn't want to go but wants to feel better.  He has agreed to go.  Wife states she will call EMS.    Message routed to PCP for FYI.

## 2016-02-18 NOTE — Telephone Encounter (Signed)
FYI. Please advise.

## 2016-02-18 NOTE — ED Provider Notes (Signed)
[facility] DEPT Provider Note   CSN: [id number] Arrival date & time: 02/18/16  1633     History   Chief Complaint Chief Complaint  Patient presents with  . Weakness    HPI Derrick Fry is a 82 y.o. male.  Patient is an 47 rolled male with a history of recent DVT and bilateral PEs started on Xarelto approximately 6 weeks ago, hypertension, hyperlipidemia, diabetes presenting today with 5 weeks of persistent abdominal pain, nausea and diarrhea. Patient states exactly 4 days after starting Xarelto he developed crampy epigastric abdominal pain, initial mild nausea and vomiting and persistent diarrhea. He and his family are concerned it is related to the medication however they have followed up with her PCP and he recently saw GI for days ago. Patient was hospitalized 9 days ago for the bilateral PEs and at that time had a CAT scan that showed no acute abdominal findings. Lab work has been done without significant findings. He was started on Prilosec and a probiotic 2-3 days ago but states he still gets crampy abdominal pain approximately 30 minutes after eating and then will get diarrhea. Today patient has eaten some rice without abdominal pain and has not had diarrhea for 2 days after starting Imodium 4 days ago. Patient denies fever, shortness of breath or chest pain but just feels generally weak and in the last 5 weeks has lost 10-12 pounds because he is now refusing TEE due to fear of recurrent abdominal pain. He denies current abdominal pain or any urinary symptoms.   The history is provided by the patient and the spouse.  Weakness  This is a recurrent problem. The current episode started more than 2 days ago. The problem has been gradually worsening. There was no focality noted. There has been no fever. Associated symptoms comments: Diarrhea and abdominal pain.    Past Medical History:  Diagnosis Date  . AAA (abdominal aortic aneurysm) ([place])   . Anemia   . Arthritis    "all  over" (02/10/2016)  . Basal cell carcinoma of face    "burned off" (02/10/2016)  . CAD (coronary artery disease)    MI [birth year]  . Chronic lower back pain   . Diabetes mellitus with neuropathy ([place])    "borderline" (02/10/2016)  . DVT (deep venous thrombosis) ([place]) 02/2016   "left thigh"  . Gait abnormality    chronic imbalance  . Gout    diskitis 10/2008, Dr Ouida Sills  . Heart murmur dx'd 02/2016  . Hyperlipidemia   . Hypertension   . Myocardial infarction 1987   "before OHS"  . OSA (obstructive sleep apnea)    Limited CPAP tolerance (02/10/2016)  . Osteoarthritis   . Peripheral neuropathy ([place])   . Pneumonia 1938   had right pneumonia pleurisy requiring resection of ribs and chest tube drainage at age 70  . Pulmonary embolism ([place]) 02/2016   "left"  . Renal insufficiency    chronic w/ solitary kidney, congenital  . Small bowel obstruction 04/01/2012    Patient Active Problem List   Diagnosis Date Noted  . Diarrhea   . Pulmonary embolus ([place])   . Pulmonary embolism ([place]) 02/09/2016  . History of DVT (deep vein thrombosis) 02/09/2016  . Nausea, vomiting and diarrhea 02/09/2016  . Failure to thrive in adult 02/09/2016  . Hyponatremia 02/09/2016  . Abnormal weight loss 02/09/2016  . Protein calorie malnutrition ([place]) 02/09/2016  . Arrhythmia 02/09/2016  . PCP NOTES >>>>> 11/20/2014  . Buzzing in ear 06/04/2013  .  Pulmonary fibrosis ([place]) 01/29/2013  . Annual physical exam 07/11/2010  . AAA (abdominal aortic aneurysm) ([place]) 07/11/2010  . High cholesterol 03/23/2009  . Gout 08/22/2007  . GAIT DISTURBANCE 08/22/2007  . Osteoarthritis  04/23/2007  . OSA (obstructive sleep apnea) 01/03/2007  . DM II (diabetes mellitus, type II), controlled ([place]) 09/05/2006  .  peripheral neuropathy --UDS--pain mngmt  09/05/2006  . Essential hypertension 09/05/2006  . CAD (coronary artery disease) 09/05/2006  . RENAL INSUFFICIENCY, CHRONIC 09/05/2006  . SOLITARY KIDNEY, CONGENITAL 09/05/2006   . SLEEP APNEA 09/05/2006    Past Surgical History:  Procedure Laterality Date  . ABDOMINAL AORTIC ANEURYSM REPAIR  ~ 1996   w/ iliac aneurysm repair i  . APPENDECTOMY    . CARDIAC CATHETERIZATION  1987   "before OHS"  . CARDIOVASCULAR STRESS TEST  10/13/2009   EF 57%  . CATARACT EXTRACTION W/ INTRAOCULAR LENS  IMPLANT, BILATERAL  09/1999,04/2003   right,left  . CORONARY ARTERY BYPASS GRAFT  01/04/1986   CABG X5  . INGUINAL HERNIA REPAIR Left 03/07/1983  . JOINT REPLACEMENT    . Knuckles replaced  05/2005   left hand  . NEPHRECTOMY Left 1996  . TONSILLECTOMY    . TOTAL KNEE ARTHROPLASTY Left 05/04/1989  . US ECHOCARDIOGRAPHY  01/14/2007   EF 55-60%       Home Medications    Prior to Admission medications   Medication Sig Start Date End Date Taking? Authorizing Provider  atorvastatin (LIPITOR) 80 MG tablet Take 1 tablet (80 mg total) by mouth daily. 12/02/15  Yes Colon Branch, MD  furosemide (LASIX) 40 MG tablet Take 1 tablet (40 mg total) by mouth daily. 08/23/15  Yes Colon Branch, MD  gabapentin (NEURONTIN) 400 MG capsule Take 2 capsules (800 mg total) by mouth 3 (three) times daily. 09/13/15  Yes Colon Branch, MD  glucose blood (ONE TOUCH ULTRA TEST) test strip Check blood sugar no more than twice daily. 07/20/14  Yes Colon Branch, MD  Lancets [facility] ULTRASOFT) lancets Check blood sugar no more than twice daily. 07/20/14  Yes Colon Branch, MD  cyanocobalamin 100 MCG tablet Take 100 mcg by mouth daily.     Historical Provider, MD  febuxostat (ULORIC) 40 MG tablet Take 40 mg by mouth daily.     Historical Provider, MD  fish oil-omega-3 fatty acids 1000 MG capsule Take 2 g by mouth daily.     Historical Provider, MD  Flaxseed, Linseed, (FLAXSEED OIL) 1000 MG CAPS Take 1,000 mg by mouth 2 (two) times daily.     Historical Provider, MD  folic acid (FOLVITE) 1 MG tablet Take 1 mg by mouth daily.    Historical Provider, MD  glucosamine-chondroitin 500-400 MG tablet Take 1 tablet by mouth 3  (three) times daily.     Historical Provider, MD  methotrexate (RHEUMATREX) 2.5 MG tablet Take 15 mg by mouth once a week. Caution:Chemotherapy. Protect from light. Every Monday    Historical Provider, MD  Multiple Vitamins-Minerals (CENTRUM SILVER) tablet Take 1 tablet by mouth daily.     Historical Provider, MD  niacin 500 MG tablet Take 500 mg by mouth at bedtime.     Historical Provider, MD  nitroGLYCERIN (NITROSTAT) 0.4 MG SL tablet Place 1 tablet (0.4 mg total) under the tongue every 5 (five) minutes as needed for chest pain. 08/05/15   Thayer Headings, MD  omeprazole (PRILOSEC) 40 MG capsule Take 1 capsule (40 mg total) by mouth daily. 02/15/16   Quillian Quince  Merrily Brittle, MD  ondansetron (ZOFRAN) 4 MG tablet Take 1 tablet (4 mg total) by mouth every 8 (eight) hours as needed for nausea or vomiting. 02/14/16   Shelda Pal, DO  oxyCODONE-acetaminophen (PERCOCET/ROXICET) 5-325 MG tablet Take 1 tablet by mouth 2 (two) times daily as needed for severe pain. 01/21/16   Colon Branch, MD  polyethylene glycol [facility] / Floria Raveling) packet Take 17 g by mouth as needed for mild constipation or moderate constipation.     Historical Provider, MD  predniSONE (DELTASONE) 5 MG tablet Take 2.5 mg by mouth daily.     Historical Provider, MD  rivaroxaban (XARELTO) 20 MG TABS tablet Take 1 tablet (20 mg total) by mouth daily with supper. 01/18/16   Colon Branch, MD  sucralfate (CARAFATE) 1 g tablet Take 1 tablet (1 g total) by mouth 4 (four) times daily -  with meals and at bedtime. 02/18/16   Blanchie Dessert, MD    Family History Family History  Problem Relation Age of Onset  . Lymphoma Sister   . Liver cancer Brother   . Lung cancer Brother   . Ulcerative colitis Brother   . Heart disease Brother   . Diabetes Brother   . Colon cancer Neg Hx   . Prostate cancer Neg Hx   . Stomach cancer Neg Hx   . Esophageal cancer Neg Hx   . Rectal cancer Neg Hx     Social History Social History  Substance Use Topics   . Smoking status: Former Smoker    Packs/day: 1.00    Years: 30.00    Types: Cigarettes    Quit date: 04/02/1972  . Smokeless tobacco: Never Used  . Alcohol use No     Comment: former heavy alcohol use     Allergies   Colchicine   Review of Systems Review of Systems  Neurological: Positive for weakness.  All other systems reviewed and are negative.    Physical Exam Updated Vital Signs BP 125/72   Pulse 90   Temp 99.1 F (37.3 C) (Oral)   Resp 17   SpO2 95%   Physical Exam  Constitutional: He is oriented to person, place, and time. He appears well-developed and well-nourished. No distress.  HENT:  Head: Normocephalic and atraumatic.  Mouth/Throat: Oropharynx is clear and moist. Mucous membranes are dry.  Eyes: Conjunctivae and EOM are normal. Pupils are equal, round, and reactive to light.  Neck: Normal range of motion. Neck supple.  Cardiovascular: Normal rate, regular rhythm and intact distal pulses.   No murmur heard. Pulmonary/Chest: Effort normal and breath sounds normal. No respiratory distress. He has no wheezes. He has no rales.  Abdominal: Soft. Bowel sounds are normal. He exhibits no distension. There is no tenderness. There is no rebound and no guarding.  Musculoskeletal: Normal range of motion. He exhibits no edema or tenderness.  Neurological: He is alert and oriented to person, place, and time.  Skin: Skin is warm and dry. No rash noted. No erythema.  Psychiatric: He has a normal mood and affect. His behavior is normal.  Nursing note and vitals reviewed.    ED Treatments / Results  Labs (all labs ordered are listed, but only abnormal results are displayed) Labs Reviewed  CBC WITH DIFFERENTIAL/PLATELET - Abnormal; Notable for the following:       Result Value   RBC 3.12 (*)    Hemoglobin 10.8 (*)    HCT 31.2 (*)    MCH 34.6 (*)  RDW 16.8 (*)    All other components within normal limits  COMPREHENSIVE METABOLIC PANEL - Abnormal; Notable for  the following:    Sodium 133 (*)    Chloride 99 (*)    Glucose, Bld 116 (*)    BUN 30 (*)    Creatinine, Ser 1.48 (*)    Calcium 8.7 (*)    Total Protein 6.0 (*)    Albumin 2.8 (*)    AST 55 (*)    GFR calc non Af Amer 41 (*)    GFR calc Af Amer 48 (*)    All other components within normal limits  URINALYSIS, ROUTINE W REFLEX MICROSCOPIC - Abnormal; Notable for the following:    Ketones, ur 5 (*)    All other components within normal limits  LIPASE, BLOOD  MAGNESIUM  I-STAT CG4 LACTIC ACID, ED    EKG  EKG Interpretation None       Radiology No results found.  Procedures Procedures (including critical care time)  Medications Ordered in ED Medications  sodium chloride 0.9 % bolus 1,000 mL (0 mLs Intravenous Stopped 02/18/16 1851)     Initial Impression / Assessment and Plan / ED Course  I have reviewed the triage vital signs and the nursing notes.  Pertinent labs & imaging results that were available during my care of the patient were reviewed by me and considered in my medical decision making (see chart for details).  Clinical Course    Patient is an elderly male with 5 weeks of ongoing abdominal pain which seems to be postprandial as well as diarrhea. He has now become anorexic due to fear of abdominal pain. He denies any fever and is currently following up with PCP and GI. He has no endoscopy scheduled for Tuesday. He has also recently started on Prilosec and a probiotic but does not know if they're working yet. He started taking Imodium 4 days ago and last episode of diarrhea was one stay but has also been refusing to eat until today. Today he did have some rice which he states did not make his stomach hurt but that is the first time. He otherwise drinks water. He does complain of generalized weakness and weight loss since all of this occurred but denies any chest pain or shortness of breath. He is concerned it may be from the Xarelto because his symptoms started 4  days after taking the medication and it is listed as a less common side effect. Labs today are consistent with mild dehydration but no evidence of hypokalemia hypomagnesemia. Patient felt better after IV fluids. Patient given Carafate to try with meals to see if that helps with his pain and encouraged to follow-up with GI for endoscopy before any significant changes in medications. So possibility that this could be gastroparesis as he does have diabetes versus chronic mesenteric ischemia given he does have a history of coronary artery disease and vascular disease. However at this time feel that he first needs endoscopy for further evaluation. No significant anemia to suggest bleeding ulcers and denies any change in stool. He is had multiple stool test done that have been negative for any type of bacterial infection or C. difficile.  Final Clinical Impressions(s) / ED Diagnoses   Final diagnoses:  Dehydration  Intermittent epigastric abdominal pain    New Prescriptions Discharge Medication List as of 02/18/2016  8:01 PM    START taking these medications   Details  sucralfate (CARAFATE) 1 g tablet Take 1 tablet (1  g total) by mouth 4 (four) times daily -  with meals and at bedtime., Starting Fri 02/18/2016, Print         Blanchie Dessert, MD 02/18/16 2155

## 2016-02-18 NOTE — Telephone Encounter (Signed)
Noted  

## 2016-02-18 NOTE — Telephone Encounter (Signed)
I agree, thanks!

## 2016-02-18 NOTE — Telephone Encounter (Signed)
Relation to [id number] Call back number:[phone]  Reason for call:  Spouse would like to discuss rivaroxaban (XARELTO) 20 MG TABS tablet. Spouse stated she would like to speak with nurse today regarding patient experiencing dirhea for 4 weeks. Patient was seen in the ED on Wednesady kept in the hospital overnignht for observance. Scheduled follow up appointment for Monday at 11:30, please advise spouse directly

## 2016-02-18 NOTE — Telephone Encounter (Signed)
Pt was advised to keep appt for procedure next week and continue zofran and prilosec.  He states the abd pain is no worse than when he saw Dr Ardis Hughs but he just can not eat.  I advised him to take small sips of broth or ensure several times thru the day to keep his strength up and to make sure he keeps appt next week.  He was advised that if his symptoms worsen he may need to go to the ED for eval.  Pt agreed and will keep appt and take small sips of ensure and broth several times a day.  He will go to the ED if pain persist.

## 2016-02-18 NOTE — Telephone Encounter (Signed)
Please triage the patient

## 2016-02-21 ENCOUNTER — Encounter (HOSPITAL_COMMUNITY): Payer: Self-pay | Admitting: *Deleted

## 2016-02-21 NOTE — Telephone Encounter (Signed)
Was eval at the ER and  released home. To have an endoscopy this week.

## 2016-02-22 ENCOUNTER — Encounter (HOSPITAL_COMMUNITY)
Admission: RE | Disposition: A | Discharge status: Home / Self Care | Payer: Self-pay | Source: Ambulatory Visit | Attending: Gastroenterology

## 2016-02-22 ENCOUNTER — Ambulatory Visit (HOSPITAL_COMMUNITY): Payer: PPO | Admitting: Certified Registered Nurse Anesthetist

## 2016-02-22 ENCOUNTER — Ambulatory Visit (HOSPITAL_COMMUNITY)
Admission: RE | Admit: 2016-02-22 | Discharge: 2016-02-22 | Disposition: A | Discharge status: Home / Self Care | Payer: PPO | Source: Ambulatory Visit | Attending: Gastroenterology | Admitting: Gastroenterology

## 2016-02-22 ENCOUNTER — Encounter (HOSPITAL_COMMUNITY): Payer: Self-pay | Admitting: Certified Registered Nurse Anesthetist

## 2016-02-22 DIAGNOSIS — I714 Abdominal aortic aneurysm, without rupture: Secondary | ICD-10-CM | POA: Diagnosis not present

## 2016-02-22 DIAGNOSIS — M109 Gout, unspecified: Secondary | ICD-10-CM | POA: Insufficient documentation

## 2016-02-22 DIAGNOSIS — Z801 Family history of malignant neoplasm of trachea, bronchus and lung: Secondary | ICD-10-CM | POA: Insufficient documentation

## 2016-02-22 DIAGNOSIS — Z888 Allergy status to other drugs, medicaments and biological substances status: Secondary | ICD-10-CM | POA: Insufficient documentation

## 2016-02-22 DIAGNOSIS — Z9842 Cataract extraction status, left eye: Secondary | ICD-10-CM | POA: Diagnosis not present

## 2016-02-22 DIAGNOSIS — G4733 Obstructive sleep apnea (adult) (pediatric): Secondary | ICD-10-CM | POA: Insufficient documentation

## 2016-02-22 DIAGNOSIS — N189 Chronic kidney disease, unspecified: Secondary | ICD-10-CM | POA: Insufficient documentation

## 2016-02-22 DIAGNOSIS — Z8679 Personal history of other diseases of the circulatory system: Secondary | ICD-10-CM | POA: Diagnosis not present

## 2016-02-22 DIAGNOSIS — K2970 Gastritis, unspecified, without bleeding: Secondary | ICD-10-CM

## 2016-02-22 DIAGNOSIS — E785 Hyperlipidemia, unspecified: Secondary | ICD-10-CM | POA: Diagnosis not present

## 2016-02-22 DIAGNOSIS — G8929 Other chronic pain: Secondary | ICD-10-CM | POA: Diagnosis not present

## 2016-02-22 DIAGNOSIS — Z87891 Personal history of nicotine dependence: Secondary | ICD-10-CM | POA: Diagnosis not present

## 2016-02-22 DIAGNOSIS — E1140 Type 2 diabetes mellitus with diabetic neuropathy, unspecified: Secondary | ICD-10-CM | POA: Diagnosis not present

## 2016-02-22 DIAGNOSIS — Z96698 Presence of other orthopedic joint implants: Secondary | ICD-10-CM | POA: Insufficient documentation

## 2016-02-22 DIAGNOSIS — Z86718 Personal history of other venous thrombosis and embolism: Secondary | ICD-10-CM | POA: Diagnosis not present

## 2016-02-22 DIAGNOSIS — R2681 Unsteadiness on feet: Secondary | ICD-10-CM | POA: Insufficient documentation

## 2016-02-22 DIAGNOSIS — K2990 Gastroduodenitis, unspecified, without bleeding: Secondary | ICD-10-CM

## 2016-02-22 DIAGNOSIS — Z96652 Presence of left artificial knee joint: Secondary | ICD-10-CM | POA: Insufficient documentation

## 2016-02-22 DIAGNOSIS — R1013 Epigastric pain: Secondary | ICD-10-CM

## 2016-02-22 DIAGNOSIS — R197 Diarrhea, unspecified: Secondary | ICD-10-CM

## 2016-02-22 DIAGNOSIS — Z833 Family history of diabetes mellitus: Secondary | ICD-10-CM | POA: Insufficient documentation

## 2016-02-22 DIAGNOSIS — I129 Hypertensive chronic kidney disease with stage 1 through stage 4 chronic kidney disease, or unspecified chronic kidney disease: Secondary | ICD-10-CM | POA: Insufficient documentation

## 2016-02-22 DIAGNOSIS — Z85828 Personal history of other malignant neoplasm of skin: Secondary | ICD-10-CM | POA: Diagnosis not present

## 2016-02-22 DIAGNOSIS — I252 Old myocardial infarction: Secondary | ICD-10-CM | POA: Diagnosis not present

## 2016-02-22 DIAGNOSIS — Z86711 Personal history of pulmonary embolism: Secondary | ICD-10-CM | POA: Insufficient documentation

## 2016-02-22 DIAGNOSIS — R634 Abnormal weight loss: Secondary | ICD-10-CM

## 2016-02-22 DIAGNOSIS — R011 Cardiac murmur, unspecified: Secondary | ICD-10-CM | POA: Insufficient documentation

## 2016-02-22 DIAGNOSIS — Z9841 Cataract extraction status, right eye: Secondary | ICD-10-CM | POA: Diagnosis not present

## 2016-02-22 DIAGNOSIS — K449 Diaphragmatic hernia without obstruction or gangrene: Secondary | ICD-10-CM | POA: Insufficient documentation

## 2016-02-22 DIAGNOSIS — K259 Gastric ulcer, unspecified as acute or chronic, without hemorrhage or perforation: Secondary | ICD-10-CM | POA: Insufficient documentation

## 2016-02-22 DIAGNOSIS — K2940 Chronic atrophic gastritis without bleeding: Secondary | ICD-10-CM | POA: Diagnosis not present

## 2016-02-22 DIAGNOSIS — Z8249 Family history of ischemic heart disease and other diseases of the circulatory system: Secondary | ICD-10-CM | POA: Insufficient documentation

## 2016-02-22 DIAGNOSIS — I2510 Atherosclerotic heart disease of native coronary artery without angina pectoris: Secondary | ICD-10-CM | POA: Diagnosis not present

## 2016-02-22 DIAGNOSIS — M1990 Unspecified osteoarthritis, unspecified site: Secondary | ICD-10-CM | POA: Diagnosis not present

## 2016-02-22 DIAGNOSIS — Z800 Family history of malignant neoplasm of digestive organs: Secondary | ICD-10-CM | POA: Insufficient documentation

## 2016-02-22 DIAGNOSIS — I700 Atherosclerosis of aorta: Secondary | ICD-10-CM | POA: Diagnosis not present

## 2016-02-22 DIAGNOSIS — Z951 Presence of aortocoronary bypass graft: Secondary | ICD-10-CM | POA: Insufficient documentation

## 2016-02-22 DIAGNOSIS — E1122 Type 2 diabetes mellitus with diabetic chronic kidney disease: Secondary | ICD-10-CM | POA: Insufficient documentation

## 2016-02-22 DIAGNOSIS — Z79899 Other long term (current) drug therapy: Secondary | ICD-10-CM | POA: Insufficient documentation

## 2016-02-22 DIAGNOSIS — Z7901 Long term (current) use of anticoagulants: Secondary | ICD-10-CM | POA: Insufficient documentation

## 2016-02-22 DIAGNOSIS — Z905 Acquired absence of kidney: Secondary | ICD-10-CM | POA: Insufficient documentation

## 2016-02-22 HISTORY — PX: ESOPHAGOGASTRODUODENOSCOPY (EGD) WITH PROPOFOL: SHX5813

## 2016-02-22 SURGERY — ESOPHAGOGASTRODUODENOSCOPY (EGD) WITH PROPOFOL
Anesthesia: Monitor Anesthesia Care

## 2016-02-22 MED ORDER — SODIUM CHLORIDE 0.9 % IV SOLN
INTRAVENOUS | Status: DC | PRN
  Administered 2016-02-22: 08:00:00 via INTRAVENOUS

## 2016-02-22 MED ORDER — SODIUM CHLORIDE 0.9 % IV SOLN
INTRAVENOUS | Status: DC
  Administered 2016-02-22: 08:00:00 via INTRAVENOUS

## 2016-02-22 MED ORDER — PROPOFOL 500 MG/50ML IV EMUL
INTRAVENOUS | Status: DC | PRN
  Administered 2016-02-22: 50 ug/kg/min via INTRAVENOUS

## 2016-02-22 NOTE — Anesthesia Postprocedure Evaluation (Signed)
Anesthesia Post Note  Patient: Derrick Fry  Procedure(s) Performed: Procedure(s) (LRB): ESOPHAGOGASTRODUODENOSCOPY (EGD) WITH PROPOFOL (N/A)  Patient location during evaluation: PACU Anesthesia Type: MAC Level of consciousness: awake and alert Pain management: pain level controlled Vital Signs Assessment: post-procedure vital signs reviewed and stable Respiratory status: spontaneous breathing Cardiovascular status: stable Anesthetic complications: no       Last Vitals:  Vitals:   02/22/16 0910 02/22/16 0911  BP:  (!) 139/91  Pulse: 82 83  Resp: 14 15  Temp:      Last Pain:  Vitals:   02/22/16 0854  TempSrc: Oral                 Nolon Nations

## 2016-02-22 NOTE — Interval H&P Note (Signed)
History and Physical Interval Note:  02/22/2016 8:11 AM  Derrick Fry  has presented today for surgery, with the diagnosis of diarrhea, weight loss, epigastric pain  The various methods of treatment have been discussed with the patient and family. After consideration of risks, benefits and other options for treatment, the patient has consented to  Procedure(s): ESOPHAGOGASTRODUODENOSCOPY (EGD) WITH PROPOFOL (N/A) as a surgical intervention .  The patient's history has been reviewed, patient examined, no change in status, stable for surgery.  I have reviewed the patient's chart and labs.  Questions were answered to the patient's satisfaction.     Milus Banister

## 2016-02-22 NOTE — Op Note (Addendum)
[facility] Patient Name: Derrick Fry Procedure Date : 02/22/2016 MRN: [id number] Attending MD: Milus Banister , MD Date of Birth: [birth year] CSN: [id number] Age: 82 Admit Type: Outpatient Procedure:                Upper GI endoscopy Indications:              Generalized abdominal pain, Dyspepsia, Nausea Providers:                Milus Banister, MD, Cleda Daub, RN, William Dalton, Technician Referring MD:              Medicines:                Monitored Anesthesia Care Complications:            No immediate complications. Estimated blood loss:                            None. Estimated Blood Loss:     Estimated blood loss: none. Procedure:                Pre-Anesthesia Assessment:                           - Prior to the procedure, a History and Physical                            was performed, and patient medications and                            allergies were reviewed. The patient's tolerance of                            previous anesthesia was also reviewed. The risks                            and benefits of the procedure and the sedation                            options and risks were discussed with the patient.                            All questions were answered, and informed consent                            was obtained. Prior Anticoagulants: The patient has                            taken Xarelto (rivaroxaban), last dose was 1 day                            prior to procedure. ASA Grade Assessment: III - A  patient with severe systemic disease. After                            reviewing the risks and benefits, the patient was                            deemed in satisfactory condition to undergo the                            procedure.                           After obtaining informed consent, the endoscope was                            passed under direct vision. Throughout the                      procedure, the patient's blood pressure, pulse, and                            oxygen saturations were monitored continuously. The                            [id number]) scope was introduced through the                            mouth, and advanced to the second part of duodenum.                            The upper GI endoscopy was accomplished without                            difficulty. The patient tolerated the procedure                            well. Scope In: Scope Out: Findings:      The esophagus was normal.      Small hiatal hernia.      Diffuse moderate inflammation characterized by erosions, erythema,       friability and shallow ulcerations was found in the gastric antrum.       Biopsies were taken with a cold forceps for histology.      The examined duodenum was normal. Impression:               - Normal esophagus.                           - Small hiatal hernia                           - Moderate gastritis (see above). This was                            biopsied. If biopsies show H. pylori then he will  be started on appropriate antibiotics. If not, then                            we will arrange for vascular surgery referral to                            consider mesenteric ischemic; he has post prandial                            GI symptoms, weight loss and CT scan shows                            significant atherosclerotic disease of the aorta,                            mesenteric takeoffs.                           - Normal examined duodenum. Moderate Sedation:      none Recommendation:           - Resume previous diet.                           - Continue present medications.                           - Await pathology results. Procedure Code(s):        --- Professional ---                           [phone], Esophagogastroduodenoscopy, flexible,                            transoral; with biopsy, single or  multiple Diagnosis Code(s):        --- Professional ---                           K29.70, Gastritis, unspecified, without bleeding                           R10.84, Generalized abdominal pain                           R10.13, Epigastric pain                           R11.0, Nausea CPT copyright 2016 [employer]. All rights reserved. The codes documented in this report are preliminary and upon coder review may  be revised to meet current compliance requirements. Milus Banister, MD 02/22/2016 8:50:41 AM This report has been signed electronically. Number of Addenda: 0

## 2016-02-22 NOTE — Discharge Instructions (Signed)

## 2016-02-22 NOTE — H&P (View-Only) (Signed)
HPI: This is a  very pleasant 82 year old man  who was referred to me by Colon Branch, MD  to evaluate  postprandial abdominal pain, diarrhea .    Chief complaint is postprandial abdominal pain, diarrhea  Ct scan abd/pelvis with IV contrast last week was essentially normal.  CBC yesterday and last week Hb 11.4, MCV 103 Sed rate last week 57  AST slightly elevated.  Started on xarelto recently for DVT to small PE  Having trouble eating.  Having a lot of diarrhea.  Water will go down, ensure hurts to eat.  This problem started 2 weeks ago.    He has pain after eating or drinking just about anything.    He has lost 30-40 pounds since the spring, more rapidly recently.  No NSAIDs  A lot of diarrhea as well; non bloody.  Sometimes at night.  He was on antibiotics 4-5 weeks ago.  No colon cancer in his family.  Review of systems: Pertinent positive and negative review of systems were noted in the above HPI section. Complete review of systems was performed and was otherwise normal.   Past Medical History:  Diagnosis Date  . AAA (abdominal aortic aneurysm) ([place])   . Anemia   . Arthritis    "all over" (02/10/2016)  . Basal cell carcinoma of face    "burned off" (02/10/2016)  . CAD (coronary artery disease)    MI 08-04-85  . Chronic lower back pain   . Diabetes mellitus with neuropathy ([place])    "borderline" (02/10/2016)  . DVT (deep venous thrombosis) ([place]) 02/2016   "left thigh"  . Gait abnormality    chronic imbalance  . Gout    diskitis 10/2008, Dr Ouida Sills  . Heart murmur dx'd 02/2016  . Hyperlipidemia   . Hypertension   . Myocardial infarction 1987   "before OHS"  . OSA (obstructive sleep apnea)    Limited CPAP tolerance (02/10/2016)  . Osteoarthritis   . Peripheral neuropathy ([place])   . Pneumonia 1938   had right pneumonia pleurisy requiring resection of ribs and chest tube drainage at age 47  . Pulmonary embolism ([place]) 02/2016   "left"  . Renal insufficiency     chronic w/ solitary kidney, congenital  . Small bowel obstruction 04/01/2012    Past Surgical History:  Procedure Laterality Date  . ABDOMINAL AORTIC ANEURYSM REPAIR  ~ 1996   w/ iliac aneurysm repair i  . APPENDECTOMY    . CARDIAC CATHETERIZATION  1987   "before OHS"  . CARDIOVASCULAR STRESS TEST  10/13/2009   EF 57%  . CATARACT EXTRACTION W/ INTRAOCULAR LENS  IMPLANT, BILATERAL  09/1999,04/2003   right,left  . CORONARY ARTERY BYPASS GRAFT  01/04/1986   CABG X5  . INGUINAL HERNIA REPAIR Left 03/07/1983  . JOINT REPLACEMENT    . Knuckles replaced  05/2005   left hand  . NEPHRECTOMY Left 1996  . TONSILLECTOMY    . TOTAL KNEE ARTHROPLASTY Left 05/04/1989  . US ECHOCARDIOGRAPHY  01/14/2007   EF 55-60%    Current Outpatient Prescriptions  Medication Sig Dispense Refill  . atorvastatin (LIPITOR) 80 MG tablet Take 1 tablet (80 mg total) by mouth daily. 90 tablet 2  . cyanocobalamin 100 MCG tablet Take 100 mcg by mouth daily.     . febuxostat (ULORIC) 40 MG tablet Take 40 mg by mouth daily.     . fish oil-omega-3 fatty acids 1000 MG capsule Take 2 g by mouth daily.     Marland Kitchen  Flaxseed, Linseed, (FLAXSEED OIL) 1000 MG CAPS Take 1,000 mg by mouth 2 (two) times daily.     . folic acid (FOLVITE) 1 MG tablet Take 1 mg by mouth daily.    . furosemide (LASIX) 40 MG tablet Take 1 tablet (40 mg total) by mouth daily. 90 tablet 2  . gabapentin (NEURONTIN) 400 MG capsule Take 2 capsules (800 mg total) by mouth 3 (three) times daily. 180 capsule 5  . glucosamine-chondroitin 500-400 MG tablet Take 1 tablet by mouth 3 (three) times daily.     Marland Kitchen glucose blood (ONE TOUCH ULTRA TEST) test strip Check blood sugar no more than twice daily. 100 each 11  . Lancets (ONETOUCH ULTRASOFT) lancets Check blood sugar no more than twice daily. 100 each 11  . methotrexate (RHEUMATREX) 2.5 MG tablet Take 15 mg by mouth once a week. Caution:Chemotherapy. Protect from light. Every Monday    . Multiple Vitamins-Minerals  (CENTRUM SILVER) tablet Take 1 tablet by mouth daily.     . niacin 500 MG tablet Take 500 mg by mouth at bedtime.     . nitroGLYCERIN (NITROSTAT) 0.4 MG SL tablet Place 1 tablet (0.4 mg total) under the tongue every 5 (five) minutes as needed for chest pain. 25 tablet 6  . ondansetron (ZOFRAN) 4 MG tablet Take 1 tablet (4 mg total) by mouth every 8 (eight) hours as needed for nausea or vomiting. 20 tablet 0  . oxyCODONE-acetaminophen (PERCOCET/ROXICET) 5-325 MG tablet Take 1 tablet by mouth 2 (two) times daily as needed for severe pain. 60 tablet 0  . polyethylene glycol (MIRALAX / GLYCOLAX) packet Take 17 g by mouth as needed for mild constipation or moderate constipation.     . predniSONE (DELTASONE) 5 MG tablet Take 2.5 mg by mouth daily.     . rivaroxaban (XARELTO) 20 MG TABS tablet Take 1 tablet (20 mg total) by mouth daily with supper. 30 tablet 2   No current facility-administered medications for this visit.     Allergies as of 02/15/2016 - Review Complete 02/15/2016  Allergen Reaction Noted  . Colchicine  10/13/2009    Family History  Problem Relation Age of Onset  . Lymphoma Sister   . Liver cancer Brother   . Lung cancer Brother   . Ulcerative colitis Brother   . Heart disease Brother   . Diabetes Brother   . Colon cancer Neg Hx   . Prostate cancer Neg Hx   . Stomach cancer Neg Hx   . Esophageal cancer Neg Hx   . Rectal cancer Neg Hx     Social History   Social History  . Marital status: Married    Spouse name: N/A  . Number of children: 2  . Years of education: N/A   Occupational History  . retired Retired   Social History Main Topics  . Smoking status: Former Smoker    Packs/day: 1.00    Years: 30.00    Types: Cigarettes    Quit date: 04/02/1972  . Smokeless tobacco: Never Used  . Alcohol use No     Comment: former heavy alcohol use  . Drug use: No  . Sexual activity: No   Other Topics Concern  . Not on file   Social History Narrative   Lost a son     Lives at home with wife    Still drives      Physical Exam: BP (!) 126/50   Pulse 72   Ht 5\' 8"  (1.727 m)  Wt 148 lb (67.1 kg)   BMI 22.50 kg/m  Constitutional: generally well-appearing Psychiatric: alert and oriented x3 Eyes: extraocular movements intact Mouth: oral pharynx moist, no lesions Neck: supple no lymphadenopathy Cardiovascular: heart regular rate and rhythm Lungs: clear to auscultation bilaterally Abdomen: soft, nontender, nondistended, no obvious ascites, no peritoneal signs, normal bowel sounds Extremities: no lower extremity edema bilaterally Skin: no lesions on visible extremities   Assessment and plan: 82 y.o. male with  significant postprandial abdominal pain, diarrhea  He has been losing significant weight over the past several months. He has pain after he eats just about anything. He is also very bothered by diarrhea if he eats anything. Sometimes he will have to get up at night with diarrhea. The diarrhea has been going on for at least several weeks. Possibly this started around the time of some antibiotics for some upper respiratory infection. He has stool testing pending and we are going to ask that he have C. difficile testing on this as well or submitted new sample if that cannot be done. Clostridium difficile infection can cause significant lower GI symptoms as well as upper GI symptoms in some patients. If the C. difficile testing is positive then treated with vancomycin and observe if his lower and upper GI symptoms improved. If the C. difficile testing is negative and no other obvious abnormalities in basic repeat blood testing including CBC, complete about profile that I think he will probably need an upper endoscopy plus minus colonoscopy in the near future. I'm empirically starting him on proton pump inhibitor once daily as well.   Derrick Loffler, MD [facility] 02/15/2016, 2:50 PM  Cc: Colon Branch, MD

## 2016-02-22 NOTE — Anesthesia Procedure Notes (Signed)
Procedure Name: MAC Date/Time: 02/22/2016 8:25 AM Performed by: Ollen Bowl Pre-anesthesia Checklist: Patient identified, Emergency Drugs available, Patient being monitored, Suction available and Timeout performed Patient Re-evaluated:Patient Re-evaluated prior to inductionOxygen Delivery Method: Nasal cannula

## 2016-02-22 NOTE — Transfer of Care (Signed)
Immediate Anesthesia Transfer of Care Note  Patient: Derrick Fry  Procedure(s) Performed: Procedure(s): ESOPHAGOGASTRODUODENOSCOPY (EGD) WITH PROPOFOL (N/A)  Patient Location: PACU  Anesthesia Type:MAC  Level of Consciousness: awake, alert , oriented and patient cooperative  Airway & Oxygen Therapy: Patient Spontanous Breathing and Patient connected to nasal cannula oxygen  Post-op Assessment: Report given to RN, Post -op Vital signs reviewed and stable and Patient moving all extremities X 4  Post vital signs: Reviewed and stable  Last Vitals:  Vitals:   02/22/16 0747  BP: (!) 150/58  Pulse: 98  Resp: 14  Temp: 36.9 C    Last Pain:  Vitals:   02/22/16 0747  TempSrc: Oral         Complications: No apparent anesthesia complications

## 2016-02-22 NOTE — Anesthesia Preprocedure Evaluation (Addendum)
Anesthesia Evaluation  Patient identified by MRN, date of birth, ID band Patient awake    Reviewed: Allergy & Precautions, NPO status , Patient's Chart, lab work & pertinent test results  Airway Mallampati: II  TM Distance: >3 FB Neck ROM: Full    Dental no notable dental hx.    Pulmonary sleep apnea , pneumonia, former smoker,    Pulmonary exam normal breath sounds clear to auscultation       Cardiovascular hypertension, Pt. on medications + CAD, + Past MI and + Peripheral Vascular Disease  Normal cardiovascular exam+ Valvular Problems/Murmurs  Rhythm:Regular Rate:Normal  Echo 02/10/16 Left ventricle: The cavity size was mildly dilated. There was mild focal basal hypertrophy of the septum. Systolic function was normal. The estimated ejection fraction was in the range of 55% to 60%. Wall motion was normal; there were no regional wall motion abnormalities. The study is not technically sufficient to  allow evaluation of LV diastolic function. - Aortic valve: Trileaflet; mildly thickened leaflets. There was mild regurgitation. - Mitral valve: There was trivial regurgitation. - Left atrium: The atrium was mildly dilated. - Pulmonary arteries: PA peak pressure: 44 mm Hg (S).  Impressions: - The right ventricular systolic pressure was increased consistent  with moderate pulmonary hypertension.   Neuro/Psych  Neuromuscular disease negative neurological ROS  negative psych ROS   GI/Hepatic negative GI ROS, Neg liver ROS,   Endo/Other  diabetes, Type 2  Renal/GU CRF and Renal InsufficiencyRenal disease     Musculoskeletal  (+) Arthritis ,   Abdominal   Peds  Hematology  (+) anemia ,   Anesthesia Other Findings   Reproductive/Obstetrics negative OB ROS                            Anesthesia Physical Anesthesia Plan  ASA: III  Anesthesia Plan: MAC   Post-op Pain Management:    Induction:  Intravenous  Airway Management Planned:   Additional Equipment:   Intra-op Plan:   Post-operative Plan:   Informed Consent: I have reviewed the patients History and Physical, chart, labs and discussed the procedure including the risks, benefits and alternatives for the proposed anesthesia with the patient or authorized representative who has indicated his/her understanding and acceptance.   Dental advisory given  Plan Discussed with: CRNA  Anesthesia Plan Comments:         Anesthesia Quick Evaluation

## 2016-02-23 ENCOUNTER — Encounter: Payer: Self-pay | Admitting: Internal Medicine

## 2016-02-23 ENCOUNTER — Ambulatory Visit (INDEPENDENT_AMBULATORY_CARE_PROVIDER_SITE_OTHER): Payer: PPO | Admitting: Internal Medicine

## 2016-02-23 VITALS — BP 120/78 | HR 81 | Temp 98.1°F | Resp 14 | Ht 68.0 in | Wt 149.4 lb

## 2016-02-23 DIAGNOSIS — R634 Abnormal weight loss: Secondary | ICD-10-CM | POA: Diagnosis not present

## 2016-02-23 DIAGNOSIS — R1013 Epigastric pain: Secondary | ICD-10-CM | POA: Diagnosis not present

## 2016-02-23 DIAGNOSIS — R627 Adult failure to thrive: Secondary | ICD-10-CM | POA: Diagnosis not present

## 2016-02-23 NOTE — Progress Notes (Signed)
Subjective:    Patient ID: Derrick Fry, male    DOB: November 05, 1927, 82 y.o.   MRN: [id number]  DOS:  02/23/2016 Type of visit - description : f/u here w/ his wife Interval history: Since the last visit went to the ER, was found to be slightly dehydrated, hemoglobin slightly lower than baseline. Had a EGD yesterday: It showed gastritis, biopsy results pending. Good medication compliance. Overall feels slightly better. Appetite is coming back, diarrhea has decrease, is tolerating soft foods for the last 24 hours without abdominal pain.   Review of Systems Denies nausea, vomiting or blood in the stools. Occasional swelling at the left leg. No pain.  Past Medical History:  Diagnosis Date  . AAA (abdominal aortic aneurysm) ([place])   . Anemia   . Arthritis    "all over" (02/10/2016)  . Basal cell carcinoma of face    "burned off" (02/10/2016)  . CAD (coronary artery disease)    MI 08-04-85  . Chronic lower back pain   . Diabetes mellitus with neuropathy ([place])    "borderline" (02/10/2016)  . DVT (deep venous thrombosis) ([place]) 02/2016   "left thigh"  . Gait abnormality    chronic imbalance  . Gout    diskitis 10/2008, Dr Ouida Sills  . Heart murmur dx'd 02/2016  . Hyperlipidemia   . Hypertension   . Myocardial infarction 1987   "before OHS"  . OSA (obstructive sleep apnea)    Limited CPAP tolerance (02/10/2016)  . Osteoarthritis   . Pneumonia 1938   had right pneumonia pleurisy requiring resection of ribs and chest tube drainage at age 55  . Pulmonary embolism ([place]) 02/2016   "left"  . Renal insufficiency    chronic w/ solitary kidney, congenital  . Small bowel obstruction 04/01/2012    Past Surgical History:  Procedure Laterality Date  . ABDOMINAL AORTIC ANEURYSM REPAIR  ~ 1996   w/ iliac aneurysm repair i  . APPENDECTOMY    . CARDIAC CATHETERIZATION  1987   "before OHS"  . CARDIOVASCULAR STRESS TEST  10/13/2009   EF 57%  . CATARACT EXTRACTION W/ INTRAOCULAR LENS  IMPLANT,  BILATERAL  09/1999,04/2003   right,left  . COLONOSCOPY    . CORONARY ARTERY BYPASS GRAFT  01/04/1986   CABG X5  . INGUINAL HERNIA REPAIR Left 03/07/1983  . JOINT REPLACEMENT    . Knuckles replaced  05/2005   left hand  . NEPHRECTOMY Left 1996  . TONSILLECTOMY    . TOTAL KNEE ARTHROPLASTY Left 05/04/1989  . US ECHOCARDIOGRAPHY  01/14/2007   EF 55-60%    Social History   Social History  . Marital status: Married    Spouse name: N/A  . Number of children: 2  . Years of education: N/A   Occupational History  . retired Retired   Social History Main Topics  . Smoking status: Former Smoker    Packs/day: 1.00    Years: 30.00    Types: Cigarettes    Quit date: 04/02/1972  . Smokeless tobacco: Never Used  . Alcohol use No     Comment: former heavy alcohol use  . Drug use: No  . Sexual activity: No   Other Topics Concern  . Not on file   Social History Narrative   Lost a son    Lives at home with wife    Still drives       Allergies as of 02/23/2016      Reactions   Colchicine    Unknown  Medication List       Accurate as of 02/23/16 11:56 AM. Always use your most recent med list.          atorvastatin 80 MG tablet Commonly known as:  LIPITOR Take 1 tablet (80 mg total) by mouth daily.   CENTRUM SILVER tablet Take 1 tablet by mouth daily.   cyanocobalamin 100 MCG tablet Take 100 mcg by mouth daily.   fish oil-omega-3 fatty acids 1000 MG capsule Take 2 g by mouth daily.   Flaxseed Oil 1000 MG Caps Take 1,000 mg by mouth 2 (two) times daily.   folic acid 1 MG tablet Commonly known as:  FOLVITE Take 1 mg by mouth daily.   furosemide 40 MG tablet Commonly known as:  LASIX Take 1 tablet (40 mg total) by mouth daily.   gabapentin 400 MG capsule Commonly known as:  NEURONTIN Take 2 capsules (800 mg total) by mouth 3 (three) times daily.   glucosamine-chondroitin 500-400 MG tablet Take 1 tablet by mouth 3 (three) times daily.   glucose blood  test strip Commonly known as:  ONE TOUCH ULTRA TEST Check blood sugar no more than twice daily.   methotrexate 2.5 MG tablet Commonly known as:  RHEUMATREX Take 15 mg by mouth once a week. Caution:Chemotherapy. Protect from light. Every Monday   niacin 500 MG tablet Take 500 mg by mouth at bedtime.   nitroGLYCERIN 0.4 MG SL tablet Commonly known as:  NITROSTAT Place 1 tablet (0.4 mg total) under the tongue every 5 (five) minutes as needed for chest pain.   omeprazole 40 MG capsule Commonly known as:  PRILOSEC Take 1 capsule (40 mg total) by mouth daily.   ondansetron 4 MG tablet Commonly known as:  ZOFRAN Take 1 tablet (4 mg total) by mouth every 8 (eight) hours as needed for nausea or vomiting.   onetouch ultrasoft lancets Check blood sugar no more than twice daily.   oxyCODONE-acetaminophen 5-325 MG tablet Commonly known as:  PERCOCET/ROXICET Take 1 tablet by mouth 2 (two) times daily as needed for severe pain.   polyethylene glycol packet Commonly known as:  MIRALAX / GLYCOLAX Take 17 g by mouth as needed for mild constipation or moderate constipation.   predniSONE 5 MG tablet Commonly known as:  DELTASONE Take 2.5 mg by mouth daily.   PROBIOTIC-10 PO Take 1 tablet by mouth daily.   rivaroxaban 20 MG Tabs tablet Commonly known as:  XARELTO Take 1 tablet (20 mg total) by mouth daily with supper.   sucralfate 1 g tablet Commonly known as:  CARAFATE Take 1 tablet (1 g total) by mouth 4 (four) times daily -  with meals and at bedtime.   ULORIC 40 MG tablet Generic drug:  febuxostat Take 40 mg by mouth daily.          Objective:   Physical Exam BP 120/78 (BP Location: Left Arm, Patient Position: Sitting, Cuff Size: Small)   Pulse 81   Temp 98.1 F (36.7 C) (Oral)   Resp 14   Ht 5\' 8"  (1.727 m)   Wt 149 lb 6 oz (67.8 kg)   SpO2 96%   BMI 22.71 kg/m  General:   Well developed, , sitting in a wheelchair, seems in better spirits and a little stronger  today.Marland Kitchen  HEENT:  Normocephalic . Face symmetric, atraumatic  Heart: RRR,  no murmur.  no pretibial edema bilaterally  Abdomen:  Not distended, soft, non-tender. Exam performed while the patient was sitting in the  chair Skin: Not pale. Not jaundice Neurologic:  alert & oriented X3.  Speech normal, gait not tested, was able to get up and got on our scale Psych--  Cognition and judgment appear intact.  Cooperative with normal attention span and concentration.  Behavior appropriate. No anxious or depressed appearing.    Assessment & Plan:   Assessment  DM with neuropathy-CRI HTN Hyperlipidemia CRI congenital solitary kidney CAD MI 1987 AAA s/p repair Pulmonary fibrosis, noted in a CT, no previous eval by pulm, Chronic pulmonary crackles at bases. MSK: Dr Amil Amen  ---PSORIATIC arthritis on MTX ---DJD ---GOUTt --diskitis 2010, on prednisone ---Pain mngmt: on oxycodone d/t neuropathy, UDS 10-2014 low risk  -- DEXA 08-2013 wnl OSA Limited CPAP tolerance Pneumonia, R side at age 31 , pleurisy, chest tube, rib surgery Anisocoria  (R pupil larger) L LEG DVT 01-2016- [facility]  PLAN:  Failure to thrive: GI sx decreasing, feeling better. Weight loss, abdominal pain, diarrhea: Sx are decreasing, C. difficile was negative. EGD yesterday showed gastritis, bx pending. Per GI note they are planning to treat for H. pylori if bx positive otherwise refer to vascular surgery for mesenteric vascular insufficiency. Plan to check a CBC in one week because the patient has gastritis and is anticoagulated. DVT: Unprovoked, continue Xarelto for now. RTC one month

## 2016-02-23 NOTE — Assessment & Plan Note (Signed)
Failure to thrive: characterized   by malaise, weight loss (wt 175 lb June 2017) and  more recently postprandial abdominal pain. Other relevant information includes:  Preserve renal function, mild anemia with normal WBCs, normal TSH, [id number] and folic acid. Dx w/ DVT ~ 01-18-2016, stated xarelto, 2 small PEs on CT Recent CT of the abdomen showed no aneurysm. At this point, my impression is that at least some of his symptoms are probably related with GI angina. He has diarrhea, stool studies pending noting that he was prescribed antibiotics 01/17/2016. Will try to add a C. difficile testing Plan: See GI today as planned, continue Xarelto, follow-up in 2 weeks.

## 2016-02-23 NOTE — Progress Notes (Signed)
Pre visit review using our clinic review tool, if applicable. No additional management support is needed unless otherwise documented below in the visit note. 

## 2016-02-23 NOTE — Patient Instructions (Signed)
  GO TO THE FRONT DESK  schedule labs to be done in 7 to 10 days: CBC, dx gastritis  Schedule your next appointment for a  Check up in 4 weeks    Call if not gradually improving or if severe stomach pain, diarrhea, blood in the stools

## 2016-02-24 ENCOUNTER — Other Ambulatory Visit: Payer: Self-pay | Admitting: Vascular Surgery

## 2016-02-24 ENCOUNTER — Other Ambulatory Visit: Payer: Self-pay

## 2016-02-24 DIAGNOSIS — K551 Chronic vascular disorders of intestine: Secondary | ICD-10-CM

## 2016-02-24 NOTE — Assessment & Plan Note (Signed)
Failure to thrive: GI sx decreasing, feeling better. Weight loss, abdominal pain, diarrhea: Sx are decreasing, C. difficile was negative. EGD yesterday showed gastritis, bx pending. Per GI note they are planning to treat for H. pylori if bx positive otherwise refer to vascular surgery for mesenteric vascular insufficiency. Plan to check a CBC in one week because the patient has gastritis and is anticoagulated. DVT: Unprovoked, continue Xarelto for now. RTC one month

## 2016-02-29 ENCOUNTER — Other Ambulatory Visit: Payer: Self-pay | Admitting: Internal Medicine

## 2016-03-01 ENCOUNTER — Other Ambulatory Visit (INDEPENDENT_AMBULATORY_CARE_PROVIDER_SITE_OTHER): Payer: PPO

## 2016-03-01 ENCOUNTER — Other Ambulatory Visit: Payer: Self-pay

## 2016-03-01 DIAGNOSIS — K2970 Gastritis, unspecified, without bleeding: Secondary | ICD-10-CM

## 2016-03-01 LAB — CBC WITH DIFFERENTIAL/PLATELET
BASOS PCT: 0.4 % (ref 0.0–3.0)
Basophils Absolute: 0 10*3/uL (ref 0.0–0.1)
EOS PCT: 1.4 % (ref 0.0–5.0)
Eosinophils Absolute: 0.1 10*3/uL (ref 0.0–0.7)
HCT: 34.6 % — ABNORMAL LOW (ref 39.0–52.0)
HEMOGLOBIN: 11.8 g/dL — AB (ref 13.0–17.0)
LYMPHS ABS: 3.2 10*3/uL (ref 0.7–4.0)
Lymphocytes Relative: 30.5 % (ref 12.0–46.0)
MCHC: 34.2 g/dL (ref 30.0–36.0)
MCV: 103.2 fl — ABNORMAL HIGH (ref 78.0–100.0)
MONO ABS: 0.5 10*3/uL (ref 0.1–1.0)
Monocytes Relative: 4.4 % (ref 3.0–12.0)
Neutro Abs: 6.6 10*3/uL (ref 1.4–7.7)
Neutrophils Relative %: 63.3 % (ref 43.0–77.0)
Platelets: 215 10*3/uL (ref 150.0–400.0)
RBC: 3.35 Mil/uL — ABNORMAL LOW (ref 4.22–5.81)
RDW: 19.1 % — AB (ref 11.5–15.5)
WBC: 10.4 10*3/uL (ref 4.0–10.5)

## 2016-03-01 NOTE — Addendum Note (Signed)
Addended by: Harl Bowie on: 03/01/2016 11:13 AM   Modules accepted: Orders

## 2016-03-02 ENCOUNTER — Encounter: Payer: Self-pay | Admitting: Vascular Surgery

## 2016-03-03 ENCOUNTER — Ambulatory Visit: Payer: PPO | Admitting: Internal Medicine

## 2016-03-09 ENCOUNTER — Telehealth: Payer: Self-pay | Admitting: Internal Medicine

## 2016-03-09 NOTE — Telephone Encounter (Signed)
Called Pt, he would like to know how much longer he will be on Xarelto- can has learned he can get a 90 day supply through the [place] for almost $100 dollars cheaper than the local pharmacy. He didn't want to get a 90 day supply from [place] if he won't be on much longer. Informed him I would send PCP message regarding this. Pt verbalized understanding.

## 2016-03-09 NOTE — Telephone Encounter (Signed)
Pt called in to speak with CMA. Pt would like a call  Back at the phone number on file.

## 2016-03-10 ENCOUNTER — Telehealth: Payer: Self-pay | Admitting: Internal Medicine

## 2016-03-10 MED ORDER — RIVAROXABAN 20 MG PO TABS: 20.0000 mg | ORAL_TABLET | Freq: Every day | ORAL | 0 refills | Status: DC

## 2016-03-10 NOTE — Telephone Encounter (Signed)
Spoke w/ Pt, he is requesting letter for Xarelto (see telephone note 03/09/2016) and prescription be faxed to Dr. Arletha Grippe at the [place] at [place] [facility].

## 2016-03-10 NOTE — Telephone Encounter (Signed)
Received fax confirmation 03/10/2016 1129.

## 2016-03-10 NOTE — Telephone Encounter (Signed)
Needs to take it for at least  3 months until mid March; will consider a ultrasound of the leg before he stopped depending how he is doing.

## 2016-03-10 NOTE — Telephone Encounter (Signed)
Spoke w/ Wells Guiles, Pt's wife, informed her of recommendations. [facility] will need [facility] stating why [facility] is taking [facility]arel[facility] and a written prescription to be able to fill medication. Will type letter and print prescription, we agreed to mail both to Pt's home address for them to take to [place].

## 2016-03-10 NOTE — Telephone Encounter (Signed)
Caller name: Relationship to patient: Self Can be reached: [phone] Pharmacy:  Reason for call: Patient request a call back to discuss sending letter to [facility] about medication

## 2016-03-13 NOTE — Telephone Encounter (Signed)
Received fax from Dr. Dorris Singh office at the [facility], requesting US venous lower left results. Results printed and faxed to [phone].

## 2016-03-15 ENCOUNTER — Ambulatory Visit (INDEPENDENT_AMBULATORY_CARE_PROVIDER_SITE_OTHER): Payer: PPO | Admitting: Vascular Surgery

## 2016-03-15 ENCOUNTER — Other Ambulatory Visit: Payer: Self-pay

## 2016-03-15 ENCOUNTER — Encounter: Payer: Self-pay | Admitting: Vascular Surgery

## 2016-03-15 VITALS — BP 140/74 | HR 68 | Temp 97.4°F | Resp 18 | Ht 68.0 in | Wt 152.8 lb

## 2016-03-15 DIAGNOSIS — K551 Chronic vascular disorders of intestine: Secondary | ICD-10-CM | POA: Diagnosis not present

## 2016-03-15 NOTE — Progress Notes (Signed)
[facility] [place]  Patient name: Derrick Fry MRN: [id number] DOB: 1927/09/13 Sex: male  REASON FOR CONSULT: Evaluation for potential mesenteric ischemia  HPI: Derrick Fry is a 83 y.o. male, who is her today for evaluation of mesenteric ischemia symptoms. He is a very pleasant 83 year old gentleman who is here today with his wife. He reports that in the spring of 2017 began having abdominal discomfort. He had weight loss and chronic abdominal pain. Also had diarrhea which has persisted to some degree. He does report that eating would make his pain worse and over the course of time he had a 40 pound weight loss and did have a food fear. Underwent endoscopy and December showing fusion inflammation in the stomach. Also underwent CT scan of his chest abdomen and pelvis in December 2017. He was found to have coronary embolus. Abdominal CT scan showed diffuse atherosclerosis of the mesenteric vessels. He is seen today for continued discussion. He reports that over the past 3-4 weeks that his pain has resolved and he is back to his baseline for eating. He has regained approximately 7 pounds from his low of 145 up into the low 150s. He does report some loose bowel movements in the morning. He continues to have swelling probably related to his recent diagnosis of DVT. He does have a history of open abdominal aortic aneurysm repair that he feels was in the 1980s. Also history of coronary artery bypass grafting. 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 AAA (abdominal aortic aneurysm) ([place])   . Anemia   . Arthritis    "all over" (02/10/2016)  . Basal cell carcinoma of face    "burned off" (02/10/2016)  . CAD (coronary artery disease)    MI 08-04-85  . Chronic lower back pain   . Diabetes mellitus with neuropathy ([place])    "borderline" (02/10/2016)   . DVT (deep venous thrombosis) ([place]) 02/2016   "left thigh"  . Gait abnormality    chronic imbalance  . Gout    diskitis 10/2008, Dr Ouida Sills  . Heart murmur dx'd 02/2016  . Hyperlipidemia   . Hypertension   . Myocardial infarction 1987   "before OHS"  . OSA (obstructive sleep apnea)    Limited CPAP tolerance (02/10/2016)  . Osteoarthritis   . Pneumonia 1938   had right pneumonia pleurisy requiring resection of ribs and chest tube drainage at age 61  . Pulmonary embolism ([place]) 02/2016   "left"  . Renal insufficiency    chronic w/ solitary kidney, congenital  . Small bowel obstruction 04/01/2012    Family History  Problem Relation Age of Onset  . Heart disease Father   . Lymphoma Sister   . Liver cancer Brother   . Lung cancer Brother   . Ulcerative colitis Brother   . Heart disease Brother   . Diabetes Brother   . Colon cancer Neg Hx   . Prostate cancer Neg Hx   . Stomach cancer Neg Hx   . Esophageal cancer Neg Hx   . Rectal cancer Neg Hx     SOCIAL HISTORY: Social History   Social History  . Marital status: Married    Spouse name: N/A  . Number of children: 2  . Years of education: N/A   Occupational History  . retired Retired   Social History Main Topics  .  Smoking status: Former Smoker    Packs/day: 1.00    Years: 30.00    Types: Cigarettes    Quit date: 04/02/1972  . Smokeless tobacco: Never Used  . Alcohol use No     Comment: former heavy alcohol use  . Drug use: No  . Sexual activity: No   Other Topics Concern  . Not on file   Social History Narrative   Lost a son    Lives at home with wife    Still drives     Allergies  Allergen Reactions  . Colchicine     Unknown     Current Outpatient Prescriptions  Medication Sig Dispense Refill  . atorvastatin (LIPITOR) 80 MG tablet Take 1 tablet (80 mg total) by mouth daily. 90 tablet 2  . cyanocobalamin 100 MCG tablet Take 100 mcg by mouth daily.     . febuxostat (ULORIC) 40 MG tablet Take  40 mg by mouth daily.     . fish oil-omega-3 fatty acids 1000 MG capsule Take 2 g by mouth daily.     . Flaxseed, Linseed, (FLAXSEED OIL) 1000 MG CAPS Take 1,000 mg by mouth 2 (two) times daily.     . folic acid (FOLVITE) 1 MG tablet Take 1 mg by mouth daily.    . furosemide (LASIX) 40 MG tablet Take 1 tablet (40 mg total) by mouth daily. 90 tablet 2  . gabapentin (NEURONTIN) 400 MG capsule Take 2 capsules (800 mg total) by mouth 3 (three) times daily. 180 capsule 5  . glucosamine-chondroitin 500-400 MG tablet Take 1 tablet by mouth 3 (three) times daily.     . methotrexate (RHEUMATREX) 2.5 MG tablet Take 15 mg by mouth once a week. Caution:Chemotherapy. Protect from light. Every Monday    . Multiple Vitamins-Minerals (CENTRUM SILVER) tablet Take 1 tablet by mouth daily.     . nitroGLYCERIN (NITROSTAT) 0.4 MG SL tablet Place 1 tablet (0.4 mg total) under the tongue every 5 (five) minutes as needed for chest pain. 25 tablet 6  . omeprazole (PRILOSEC) 40 MG capsule Take 1 capsule (40 mg total) by mouth daily. 90 capsule 3  . ondansetron (ZOFRAN) 4 MG tablet Take 1 tablet (4 mg total) by mouth every 8 (eight) hours as needed for nausea or vomiting. 20 tablet 0  . oxyCODONE-acetaminophen (PERCOCET/ROXICET) 5-325 MG tablet Take 1 tablet by mouth 2 (two) times daily as needed for severe pain. 60 tablet 0  . polyethylene glycol (MIRALAX / GLYCOLAX) packet Take 17 g by mouth as needed for mild constipation or moderate constipation.     . predniSONE (DELTASONE) 5 MG tablet Take 2.5 mg by mouth daily.     . Probiotic Product (PROBIOTIC-10 PO) Take 1 tablet by mouth daily.    . rivaroxaban (XARELTO) 20 MG TABS tablet Take 1 tablet (20 mg total) by mouth daily with supper. 90 tablet 0  . sucralfate (CARAFATE) 1 g tablet Take 1 tablet (1 g total) by mouth 4 (four) times daily -  with meals and at bedtime. 30 tablet 0  . glucose blood (ONE TOUCH ULTRA TEST) test strip Check blood sugar no more than twice daily.  (Patient not taking: Reported on 03/15/2016) 100 each 11  . Lancets (ONETOUCH ULTRASOFT) lancets Check blood sugar no more than twice daily. (Patient not taking: Reported on 03/15/2016) 100 each 11   No current facility-administered medications for this visit.     REVIEW OF SYSTEMS:  [X]  denotes positive finding, [ ]  denotes  negative finding Cardiac  Comments:  Chest pain or chest pressure:    Shortness of breath upon exertion: x   Short of breath when lying flat:    Irregular heart rhythm: x       Vascular    Pain in calf, thigh, or hip brought on by ambulation: x   Pain in feet at night that wakes you up from your sleep:     Blood clot in your veins: x   Leg swelling:  x       Pulmonary    Oxygen at home:    Productive cough:     Wheezing:         Neurologic    Sudden weakness in arms or legs:     Sudden numbness in arms or legs:     Sudden onset of difficulty speaking or slurred speech:    Temporary loss of vision in one eye:     Problems with dizziness:         Gastrointestinal    Blood in stool:     Vomited blood:         Genitourinary    Burning when urinating:     Blood in urine:        Psychiatric    Major depression:         Hematologic    Bleeding problems:    Problems with blood clotting too easily:        Skin    Rashes or ulcers:        Constitutional    Fever or chills:      PHYSICAL EXAM: Vitals:   03/15/16 1031  BP: 140/74  Pulse: 68  Resp: 18  Temp: 97.4 F (36.3 C)  TempSrc: Oral  SpO2: 98%  Weight: 152 lb 12.8 oz (69.3 kg)  Height: 5\' 8"  (1.727 m)    GENERAL: The patient is a well-nourished male, in no acute distress. The vital signs are documented above. CARDIOVASCULAR: Carotid arteries without bruits bilaterally. Radial pulses 2+ bilaterally. Dorsalis pedis pulses 2+ bilaterally. PULMONARY: There is good air exchange  ABDOMEN: Soft and non-tender. I do not palpate any masses and he has no tenderness.  MUSCULOSKELETAL: There  are no major deformities or cyanosis. NEUROLOGIC: No focal weakness or paresthesias are detected. SKIN: There are no ulcers or rashes noted. PSYCHIATRIC: The patient has a normal affect.  DATA:  Reviewed his CT scan from 02/09/2016. This does show diffuse calcific atherosclerotic changes at the origin of his celiac axis and superior mesenteric artery. These are not occluded. Difficult to tell with this exam whether there is severe flow limitation. IMA is chronically occluded from his prior aortic surgery.  MEDICAL ISSUES: 83 year old with weight loss abdominal pain associated with food which had progressed from spring of 2017 up until December. For unexplained reasons this appears to have resolved. Over the past 3-4 weeks he is gaining strength and gaining weight and is eating without difficulty. I explained that there is a great degree of cross filling for mesenteric flow and even with severe mesenteric occlusive disease the symptoms are infrequent. She currently is having no difficulty with eating and is gaining weight and not having postprandial pain I would not recommend any further evaluation currently. Should these symptoms recur, would recommend formal arteriogram. It appears as though he would be a candidate for stenting of his spare mesenteric artery based on the CT scan. I did explain the limitations of this but certainly feel he would  in all likelihood be prohibitive risk for open mesenteric revascularization. I reviewed symptoms to be concerned about and they will notify should he have any recurrent symptoms. Otherwise we'll see him on an as-needed basis   Rosetta Posner, MD [facility] Tel [phone] Pager [phone]

## 2016-03-22 ENCOUNTER — Ambulatory Visit: Payer: PPO | Admitting: Internal Medicine

## 2016-03-27 ENCOUNTER — Ambulatory Visit (INDEPENDENT_AMBULATORY_CARE_PROVIDER_SITE_OTHER): Payer: PPO | Admitting: Internal Medicine

## 2016-03-27 ENCOUNTER — Encounter: Payer: Self-pay | Admitting: Internal Medicine

## 2016-03-27 ENCOUNTER — Ambulatory Visit: Payer: PPO | Admitting: Gastroenterology

## 2016-03-27 VITALS — BP 122/70 | HR 87 | Temp 98.0°F | Resp 14 | Ht 68.0 in | Wt 154.4 lb

## 2016-03-27 DIAGNOSIS — R634 Abnormal weight loss: Secondary | ICD-10-CM | POA: Diagnosis not present

## 2016-03-27 DIAGNOSIS — I2699 Other pulmonary embolism without acute cor pulmonale: Secondary | ICD-10-CM | POA: Diagnosis not present

## 2016-03-27 DIAGNOSIS — I82492 Acute embolism and thrombosis of other specified deep vein of left lower extremity: Secondary | ICD-10-CM

## 2016-03-27 DIAGNOSIS — R627 Adult failure to thrive: Secondary | ICD-10-CM | POA: Diagnosis not present

## 2016-03-27 NOTE — Progress Notes (Signed)
Subjective:    Patient ID: Derrick Fry, male    DOB: 1927/08/06, 83 y.o.   MRN: [id number]  DOS:  03/27/2016 Type of visit - description : f/u Interval history: Since the last time he was here, he is doing better. Appetite remains very good, he feels a little stronger (not yet back to baseline) Abdominal pain has stopped. Note from vascular surgery reviewed.  Wt Readings from Last 3 Encounters:  03/27/16 154 lb 6 oz (70 kg)  03/15/16 152 lb 12.8 oz (69.3 kg)  02/23/16 149 lb 6 oz (67.8 kg)     Review of Systems Denies chest pain or difficulty breathing No nausea, vomiting, blood in the stools. Still has occasional watery diarrhea. Mild edema LE  is on and off  Past Medical History:  Diagnosis Date  . AAA (abdominal aortic aneurysm) ([place])   . Anemia   . Arthritis    "all over" (02/10/2016)  . Basal cell carcinoma of face    "burned off" (02/10/2016)  . CAD (coronary artery disease)    MI 08-04-85  . Chronic lower back pain   . Diabetes mellitus with neuropathy ([place])    "borderline" (02/10/2016)  . DVT (deep venous thrombosis) ([place]) 02/2016   "left thigh"  . Gait abnormality    chronic imbalance  . Gout    diskitis 10/2008, Dr Ouida Sills  . Heart murmur dx'd 02/2016  . Hyperlipidemia   . Hypertension   . Myocardial infarction 1987   "before OHS"  . OSA (obstructive sleep apnea)    Limited CPAP tolerance (02/10/2016)  . Osteoarthritis   . Pneumonia 1938   had right pneumonia pleurisy requiring resection of ribs and chest tube drainage at age 81  . Pulmonary embolism ([place]) 02/2016   "left"  . Renal insufficiency    chronic w/ solitary kidney, congenital  . Small bowel obstruction 04/01/2012    Past Surgical History:  Procedure Laterality Date  . ABDOMINAL AORTIC ANEURYSM REPAIR  ~ 1996   w/ iliac aneurysm repair i  . APPENDECTOMY    . CARDIAC CATHETERIZATION  1987   "before OHS"  . CARDIOVASCULAR STRESS TEST  10/13/2009   EF 57%  . CATARACT EXTRACTION W/  INTRAOCULAR LENS  IMPLANT, BILATERAL  09/1999,04/2003   right,left  . COLONOSCOPY    . CORONARY ARTERY BYPASS GRAFT  01/04/1986   CABG X5  . ESOPHAGOGASTRODUODENOSCOPY (EGD) WITH PROPOFOL N/A 02/22/2016   Procedure: ESOPHAGOGASTRODUODENOSCOPY (EGD) WITH PROPOFOL;  Surgeon: Milus Banister, MD;  Location: [facility];  Service: Endoscopy;  Laterality: N/A;  . INGUINAL HERNIA REPAIR Left 03/07/1983  . JOINT REPLACEMENT    . Knuckles replaced  05/2005   left hand  . NEPHRECTOMY Left 1996  . TONSILLECTOMY    . TOTAL KNEE ARTHROPLASTY Left 05/04/1989  . US ECHOCARDIOGRAPHY  01/14/2007   EF 55-60%    Social History   Social History  . Marital status: Married    Spouse name: N/A  . Number of children: 2  . Years of education: N/A   Occupational History  . retired Retired   Social History Main Topics  . Smoking status: Former Smoker    Packs/day: 1.00    Years: 30.00    Types: Cigarettes    Quit date: 04/02/1972  . Smokeless tobacco: Never Used  . Alcohol use No     Comment: former heavy alcohol use  . Drug use: No  . Sexual activity: No   Other Topics Concern  . Not  on file   Social History Narrative   Lost a son    Lives at home with wife    Still drives       Allergies as of 03/27/2016      Reactions   Colchicine    Unknown       Medication List       Accurate as of 03/27/16  6:28 PM. Always use your most recent med list.          atorvastatin 80 MG tablet Commonly known as:  LIPITOR Take 1 tablet (80 mg total) by mouth daily.   CENTRUM SILVER tablet Take 1 tablet by mouth daily.   cyanocobalamin 100 MCG tablet Take 100 mcg by mouth daily.   fish oil-omega-3 fatty acids 1000 MG capsule Take 2 g by mouth daily.   Flaxseed Oil 1000 MG Caps Take 1,000 mg by mouth 2 (two) times daily.   folic acid 1 MG tablet Commonly known as:  FOLVITE Take 1 mg by mouth daily.   furosemide 40 MG tablet Commonly known as:  LASIX Take 1 tablet (40 mg total) by  mouth daily.   gabapentin 400 MG capsule Commonly known as:  NEURONTIN Take 2 capsules (800 mg total) by mouth 3 (three) times daily.   glucosamine-chondroitin 500-400 MG tablet Take 1 tablet by mouth 3 (three) times daily.   glucose blood test strip Commonly known as:  ONE TOUCH ULTRA TEST Check blood sugar no more than twice daily.   methotrexate 2.5 MG tablet Commonly known as:  RHEUMATREX Take 15 mg by mouth once a week. Caution:Chemotherapy. Protect from light. Every Monday   nitroGLYCERIN 0.4 MG SL tablet Commonly known as:  NITROSTAT Place 1 tablet (0.4 mg total) under the tongue every 5 (five) minutes as needed for chest pain.   omeprazole 40 MG capsule Commonly known as:  PRILOSEC Take 1 capsule (40 mg total) by mouth daily.   ondansetron 4 MG tablet Commonly known as:  ZOFRAN Take 1 tablet (4 mg total) by mouth every 8 (eight) hours as needed for nausea or vomiting.   onetouch ultrasoft lancets Check blood sugar no more than twice daily.   oxyCODONE-acetaminophen 5-325 MG tablet Commonly known as:  PERCOCET/ROXICET Take 1 tablet by mouth 2 (two) times daily as needed for severe pain.   polyethylene glycol packet Commonly known as:  MIRALAX / GLYCOLAX Take 17 g by mouth as needed for mild constipation or moderate constipation.   predniSONE 5 MG tablet Commonly known as:  DELTASONE Take 2.5 mg by mouth daily.   PROBIOTIC-10 PO Take 1 tablet by mouth daily.   rivaroxaban 20 MG Tabs tablet Commonly known as:  XARELTO Take 1 tablet (20 mg total) by mouth daily with supper.   sucralfate 1 g tablet Commonly known as:  CARAFATE Take 1 tablet (1 g total) by mouth 4 (four) times daily -  with meals and at bedtime.   ULORIC 40 MG tablet Generic drug:  febuxostat Take 40 mg by mouth daily.          Objective:   Physical Exam BP 122/70 (BP Location: Left Arm, Patient Position: Sitting, Cuff Size: Small)   Pulse 87   Temp 98 F (36.7 C) (Oral)   Resp  14   Ht 5\' 8"  (1.727 m)   Wt 154 lb 6 oz (70 kg)   SpO2 91%   BMI 23.47 kg/m  General:   Well developed, well nourished . NAD.  HEENT:  Normocephalic . Face symmetric, atraumatic Abdomen:  Not distended, soft, non-tender. No rebound or rigidity.  Skin: Not pale. Not jaundice Neurologic:  alert & oriented X3.  Speech normal, gait appropriate for age and assisted by a cane Psych--  Cognition and judgment appear intact.  Cooperative with normal attention span and concentration.  Behavior appropriate. No anxious or depressed appearing.    Assessment & Plan:   Assessment  DM with neuropathy-CRI HTN Hyperlipidemia CRI congenital solitary kidney CAD MI 1987 AAA s/p repair Pulmonary fibrosis, noted in a CT, no previous eval by pulm, Chronic pulmonary crackles at bases. MSK: Dr Amil Amen  ---PSORIATIC arthritis on MTX ---DJD ---GOUTt --diskitis 2010, on prednisone ---Pain mngmt: on oxycodone d/t neuropathy, UDS 10-2014 low risk  -- DEXA 08-2013 wnl OSA Limited CPAP tolerance Pneumonia, R side at age 30 , pleurisy, chest tube, rib surgery Anisocoria  (R pupil larger) L LEG DVT and small PE [facility]18-2016 [facility]  PLAN:  FTT: Overall improving Weight loss, abdominal pain, diarrhea:  Since the last OV, EGD bx came back with no H. pylori, he saw vascular surgery, given that he was getting better they did not feel the patient needed further eval for suspected mesenteric ischemia. Fortunately is gaining weight, abdominal gain has  subsided, he still has some diarrhea ( was C. difficile negative last month). Last hemoglobin 3 weeks ago improved thus rec observation. DVT, small PE: DX 01/18/2016, anticoagulated, seems to be tolerating that well. Reassess in 6-8 weeks when he comes back. I mentioned before 3 months of anticoagulation however some guidelines rec up to 6 months for unprovoked PEs. Will factor in the risk of bleeding when we make the decision of stop xarelto.   RTC  6-8 weeks

## 2016-03-27 NOTE — Patient Instructions (Signed)
  GO TO THE FRONT DESK Schedule your next appointment for a  Physical in 6 to 8 weeks from today

## 2016-03-27 NOTE — Progress Notes (Signed)
Pre visit review using our clinic review tool, if applicable. No additional management support is needed unless otherwise documented below in the visit note. 

## 2016-03-27 NOTE — Assessment & Plan Note (Signed)
FTT: Overall improving Weight loss, abdominal pain, diarrhea:  Since the last OV, EGD bx came back with no H. pylori, he saw vascular surgery, given that he was getting better they did not feel the patient needed further eval for suspected mesenteric ischemia. Fortunately is gaining weight, abdominal gain has  subsided, he still has some diarrhea ( was C. difficile negative last month). Last hemoglobin 3 weeks ago improved thus rec observation. DVT, small PE: DX 01/18/2016, anticoagulated, seems to be tolerating that well. Reassess in 6-8 weeks when he comes back. I mentioned before 3 months of anticoagulation however some guidelines rec up to 6 months for unprovoked PEs. Will factor in the risk of bleeding when we make the decision of stop xarelto.   RTC 6-8 weeks

## 2016-04-10 ENCOUNTER — Telehealth: Payer: Self-pay | Admitting: Internal Medicine

## 2016-04-10 NOTE — Telephone Encounter (Signed)
Patient is requesting a refill of oxyCODONE-acetaminophen (PERCOCET/ROXICET) 5-325 MG tablet Please advise  Phone: [phone]

## 2016-04-10 NOTE — Telephone Encounter (Signed)
Ok #60, no RFs 

## 2016-04-10 NOTE — Telephone Encounter (Signed)
Pt is requesting refill on Oxycodone.  Last OV: 03/27/2016 Last Fill: 01/21/2016 #60 and 0RF UDS: 09/22/2015 Low risk  Please advise.

## 2016-04-11 MED ORDER — OXYCODONE-ACETAMINOPHEN 5-325 MG PO TABS: 1.0000 | ORAL_TABLET | Freq: Two times a day (BID) | ORAL | 0 refills | Status: DC | PRN

## 2016-04-11 NOTE — Telephone Encounter (Signed)
Rx printed, awaiting MD signature.  

## 2016-04-11 NOTE — Telephone Encounter (Signed)
Please inform Pt that Rx has been placed at front desk for pick up. Thank you.  

## 2016-04-11 NOTE — Telephone Encounter (Signed)
Patient aware.

## 2016-04-12 ENCOUNTER — Other Ambulatory Visit: Payer: Self-pay | Admitting: Internal Medicine

## 2016-04-27 ENCOUNTER — Encounter: Payer: PPO | Admitting: Internal Medicine

## 2016-05-04 DIAGNOSIS — M1A09X1 Idiopathic chronic gout, multiple sites, with tophus (tophi): Secondary | ICD-10-CM | POA: Diagnosis not present

## 2016-05-04 DIAGNOSIS — M25561 Pain in right knee: Secondary | ICD-10-CM | POA: Diagnosis not present

## 2016-05-04 DIAGNOSIS — Z79899 Other long term (current) drug therapy: Secondary | ICD-10-CM | POA: Diagnosis not present

## 2016-05-04 DIAGNOSIS — L4059 Other psoriatic arthropathy: Secondary | ICD-10-CM | POA: Diagnosis not present

## 2016-05-04 DIAGNOSIS — M5136 Other intervertebral disc degeneration, lumbar region: Secondary | ICD-10-CM | POA: Diagnosis not present

## 2016-05-04 DIAGNOSIS — M150 Primary generalized (osteo)arthritis: Secondary | ICD-10-CM | POA: Diagnosis not present

## 2016-05-04 DIAGNOSIS — M25512 Pain in left shoulder: Secondary | ICD-10-CM | POA: Diagnosis not present

## 2016-05-04 DIAGNOSIS — Z6823 Body mass index (BMI) 23.0-23.9, adult: Secondary | ICD-10-CM | POA: Diagnosis not present

## 2016-05-04 DIAGNOSIS — N183 Chronic kidney disease, stage 3 (moderate): Secondary | ICD-10-CM | POA: Diagnosis not present

## 2016-05-04 DIAGNOSIS — M2550 Pain in unspecified joint: Secondary | ICD-10-CM | POA: Diagnosis not present

## 2016-05-08 ENCOUNTER — Telehealth: Payer: Self-pay | Admitting: Internal Medicine

## 2016-05-08 MED ORDER — OXYCODONE-ACETAMINOPHEN 5-325 MG PO TABS: 1.0000 | ORAL_TABLET | Freq: Two times a day (BID) | ORAL | 0 refills | Status: DC | PRN

## 2016-05-08 NOTE — Telephone Encounter (Signed)
Pt is requesting refill on Oxycodone.  Last OV: 03/27/2016 Last Fill: 04/11/2016 #60 and 0RF UDS: 09/22/2015 Low risk  Please advise.

## 2016-05-08 NOTE — Telephone Encounter (Signed)
Ok 60, 2 RXs

## 2016-05-08 NOTE — Telephone Encounter (Signed)
Relation to [id number] Call back number:[phone]   Reason for call:  Patient requesting a refill oxyCODONE-acetaminophen (PERCOCET/ROXICET) 5-325 MG tablet

## 2016-05-08 NOTE — Telephone Encounter (Signed)
Please inform Pt that Rx has been placed at front desk for pick up at his convenience. Thank you.  

## 2016-05-08 NOTE — Telephone Encounter (Signed)
Rx's for March and April 2018 printed, awaiting MD signature.  

## 2016-05-08 NOTE — Telephone Encounter (Signed)
lvm advising patient of message below °

## 2016-05-13 ENCOUNTER — Other Ambulatory Visit: Payer: Self-pay | Admitting: Internal Medicine

## 2016-05-19 ENCOUNTER — Telehealth: Payer: Self-pay | Admitting: Internal Medicine

## 2016-05-19 MED ORDER — RIVAROXABAN 20 MG PO TABS: 20.0000 mg | ORAL_TABLET | Freq: Every day | ORAL | 0 refills | Status: DC

## 2016-05-19 NOTE — Telephone Encounter (Signed)
Rx sent 

## 2016-05-19 NOTE — Telephone Encounter (Signed)
Caller name: Relationship to patient: Self Can be reached: [phone] Pharmacy:  [id number] - [place], [place] [phone] (Phone) [phone] (Fax)     Reason for call: Request a 1 week supply of rivaroxaban (XARELTO) 20 MG TABS tablet until he receives his Rx from mail order.

## 2016-05-22 ENCOUNTER — Encounter: Payer: Self-pay | Admitting: Internal Medicine

## 2016-05-22 ENCOUNTER — Ambulatory Visit (INDEPENDENT_AMBULATORY_CARE_PROVIDER_SITE_OTHER): Payer: PPO | Admitting: Internal Medicine

## 2016-05-22 VITALS — BP 132/60 | HR 68 | Temp 97.7°F | Resp 14 | Ht 68.0 in | Wt 157.5 lb

## 2016-05-22 DIAGNOSIS — N183 Chronic kidney disease, stage 3 unspecified: Secondary | ICD-10-CM

## 2016-05-22 DIAGNOSIS — D649 Anemia, unspecified: Secondary | ICD-10-CM

## 2016-05-22 DIAGNOSIS — Z0000 Encounter for general adult medical examination without abnormal findings: Secondary | ICD-10-CM

## 2016-05-22 DIAGNOSIS — E1122 Type 2 diabetes mellitus with diabetic chronic kidney disease: Secondary | ICD-10-CM

## 2016-05-22 LAB — CBC WITH DIFFERENTIAL/PLATELET
Basophils Absolute: 0 10*3/uL (ref 0.0–0.1)
Basophils Relative: 0.4 % (ref 0.0–3.0)
EOS ABS: 0.1 10*3/uL (ref 0.0–0.7)
Eosinophils Relative: 1 % (ref 0.0–5.0)
HEMATOCRIT: 35 % — AB (ref 39.0–52.0)
Hemoglobin: 11.7 g/dL — ABNORMAL LOW (ref 13.0–17.0)
LYMPHS PCT: 20.7 % (ref 12.0–46.0)
Lymphs Abs: 1.7 10*3/uL (ref 0.7–4.0)
MCHC: 33.5 g/dL (ref 30.0–36.0)
MCV: 100.8 fl — ABNORMAL HIGH (ref 78.0–100.0)
Monocytes Absolute: 0.7 10*3/uL (ref 0.1–1.0)
Monocytes Relative: 8.3 % (ref 3.0–12.0)
NEUTROS ABS: 5.9 10*3/uL (ref 1.4–7.7)
Neutrophils Relative %: 69.6 % (ref 43.0–77.0)
PLATELETS: 162 10*3/uL (ref 150.0–400.0)
RBC: 3.47 Mil/uL — ABNORMAL LOW (ref 4.22–5.81)
RDW: 18.6 % — ABNORMAL HIGH (ref 11.5–15.5)
WBC: 8.4 10*3/uL (ref 4.0–10.5)

## 2016-05-22 LAB — FERRITIN: Ferritin: 202 ng/mL (ref 22.0–322.0)

## 2016-05-22 LAB — IRON: Iron: 46 ug/dL (ref 42–165)

## 2016-05-22 LAB — HEMOGLOBIN A1C: HEMOGLOBIN A1C: 6.4 % (ref 4.6–6.5)

## 2016-05-22 NOTE — Assessment & Plan Note (Addendum)
Td 2010 ; pneumonia shot 2007 ;prevnar-- 2015 [facility]    No further prostate or colon ca screening   Bone Density: 08/14/13-- al [place] ---normal Doing very well, diet, exercise   discussed.

## 2016-05-22 NOTE — Progress Notes (Signed)
Subjective:    Patient ID: Derrick Fry, male    DOB: 09/03/1927, 83 y.o.   MRN: [id number]  DOS:  05/22/2016 Type of visit - description : Complete physical exam Interval history: No major concerns, feeling at baseline. meds  reviewed. Previous labs reviewed.   Wt Readings from Last 3 Encounters:  05/22/16 157 lb 8 oz (71.4 kg)  03/27/16 154 lb 6 oz (70 kg)  03/15/16 152 lb 12.8 oz (69.3 kg)     Review of Systems No  chest pain, frequent cough, difficulty breathing. Still has LE edema, L>R  . No nausea, vomiting, diarrhea or blood in the stools. No hematuria or difficulty urinating.   Other than above, a 14 point review of systems is negative    Past Medical History:  Diagnosis Date  . AAA (abdominal aortic aneurysm) ([place])   . Anemia   . Arthritis    "all over" (02/10/2016)  . Basal cell carcinoma of face    "burned off" (02/10/2016)  . CAD (coronary artery disease)    MI 08-04-85  . Chronic lower back pain   . Diabetes mellitus with neuropathy ([place])    "borderline" (02/10/2016)  . DVT (deep venous thrombosis) ([place]) 02/2016   "left thigh"  . Gait abnormality    chronic imbalance  . Gout    diskitis 10/2008, Dr Ouida Sills  . Heart murmur dx'd 02/2016  . Hyperlipidemia   . Hypertension   . Myocardial infarction 1987   "before OHS"  . OSA (obstructive sleep apnea)    Limited CPAP tolerance (02/10/2016)  . Osteoarthritis   . Pneumonia 1938   had right pneumonia pleurisy requiring resection of ribs and chest tube drainage at age 81  . Pulmonary embolism ([place]) 02/2016   "left"  . Renal insufficiency    chronic w/ solitary kidney, congenital  . Small bowel obstruction 04/01/2012    Past Surgical History:  Procedure Laterality Date  . ABDOMINAL AORTIC ANEURYSM REPAIR  ~ 1996   w/ iliac aneurysm repair i  . APPENDECTOMY    . CARDIAC CATHETERIZATION  1987   "before OHS"  . CARDIOVASCULAR STRESS TEST  10/13/2009   EF 57%  . CATARACT EXTRACTION W/ INTRAOCULAR LENS   IMPLANT, BILATERAL  09/1999,04/2003   right,left  . COLONOSCOPY    . CORONARY ARTERY BYPASS GRAFT  01/04/1986   CABG X5  . ESOPHAGOGASTRODUODENOSCOPY (EGD) WITH PROPOFOL N/A 02/22/2016   Procedure: ESOPHAGOGASTRODUODENOSCOPY (EGD) WITH PROPOFOL;  Surgeon: Milus Banister, MD;  Location: [facility];  Service: Endoscopy;  Laterality: N/A;  . INGUINAL HERNIA REPAIR Left 03/07/1983  . JOINT REPLACEMENT    . Knuckles replaced  05/2005   left hand  . NEPHRECTOMY Left 1996  . TONSILLECTOMY    . TOTAL KNEE ARTHROPLASTY Left 05/04/1989  . US ECHOCARDIOGRAPHY  01/14/2007   EF 55-60%    Social History   Social History  . Marital status: Married    Spouse name: N/A  . Number of children: 2  . Years of education: N/A   Occupational History  . retired Retired   Social History Main Topics  . Smoking status: Former Smoker    Packs/day: 1.00    Years: 30.00    Types: Cigarettes    Quit date: 04/02/1972  . Smokeless tobacco: Never Used  . Alcohol use No     Comment: former heavy alcohol use  . Drug use: No  . Sexual activity: No   Other Topics Concern  . Not on  file   Social History Narrative   Lost a son    Lives at home with wife    Still drives    Family History  Problem Relation Age of Onset  . Heart disease Father   . Lymphoma Sister   . Liver cancer Brother   . Lung cancer Brother   . Ulcerative colitis Brother   . Heart disease Brother   . Diabetes Brother   . Colon cancer Neg Hx   . Prostate cancer Neg Hx   . Stomach cancer Neg Hx   . Esophageal cancer Neg Hx   . Rectal cancer Neg Hx       Allergies as of 05/22/2016      Reactions   Colchicine    Unknown       Medication List       Accurate as of 05/22/16  7:20 PM. Always use your most recent med list.          atorvastatin 80 MG tablet Commonly known as:  LIPITOR Take 1 tablet (80 mg total) by mouth daily.   CENTRUM SILVER tablet Take 1 tablet by mouth daily.   cyanocobalamin 100 MCG  tablet Take 100 mcg by mouth daily.   fish oil-omega-3 fatty acids 1000 MG capsule Take 2 g by mouth daily.   Flaxseed Oil 1000 MG Caps Take 1,000 mg by mouth 2 (two) times daily.   folic acid 1 MG tablet Commonly known as:  FOLVITE Take 1 mg by mouth daily.   furosemide 40 MG tablet Commonly known as:  LASIX Take 1 tablet (40 mg total) by mouth daily.   gabapentin 400 MG capsule Commonly known as:  NEURONTIN Take 2 capsules (800 mg total) by mouth 3 (three) times daily.   glucosamine-chondroitin 500-400 MG tablet Take 1 tablet by mouth 3 (three) times daily.   glucose blood test strip Commonly known as:  ONE TOUCH ULTRA TEST Check blood sugar no more than twice daily.   methotrexate 2.5 MG tablet Commonly known as:  RHEUMATREX Take 15 mg by mouth once a week. Caution:Chemotherapy. Protect from light. Every Monday   nitroGLYCERIN 0.4 MG SL tablet Commonly known as:  NITROSTAT Place 1 tablet (0.4 mg total) under the tongue every 5 (five) minutes as needed for chest pain.   omeprazole 40 MG capsule Commonly known as:  PRILOSEC Take 1 capsule (40 mg total) by mouth daily.   onetouch ultrasoft lancets Check blood sugar no more than twice daily.   oxyCODONE-acetaminophen 5-325 MG tablet Commonly known as:  PERCOCET/ROXICET Take 1 tablet by mouth 2 (two) times daily as needed for severe pain.   oxyCODONE-acetaminophen 5-325 MG tablet Commonly known as:  PERCOCET/ROXICET Take 1 tablet by mouth 2 (two) times daily as needed for severe pain.   polyethylene glycol packet Commonly known as:  MIRALAX / GLYCOLAX Take 17 g by mouth as needed for mild constipation or moderate constipation.   predniSONE 5 MG tablet Commonly known as:  DELTASONE Take 2.5 mg by mouth daily.   PROBIOTIC-10 PO Take 1 tablet by mouth daily.   rivaroxaban 20 MG Tabs tablet Commonly known as:  XARELTO Take 1 tablet (20 mg total) by mouth daily with supper.   sucralfate 1 g tablet Commonly  known as:  CARAFATE Take 1 tablet (1 g total) by mouth 4 (four) times daily -  with meals and at bedtime.   ULORIC 40 MG tablet Generic drug:  febuxostat Take 40 mg by mouth daily.  Objective:   Physical Exam BP 132/60 (BP Location: Left Arm, Patient Position: Sitting, Cuff Size: Small)   Pulse 68   Temp 97.7 F (36.5 C) (Oral)   Resp 14   Ht 5\' 8"  (1.727 m)   Wt 157 lb 8 oz (71.4 kg)   SpO2 97%   BMI 23.95 kg/m  General:   Well developed, well nourished . NAD.  HEENT:  Normocephalic . Face symmetric, atraumatic Neck: No thyromegaly Lungs:  dry crackles, both bases. Normal respiratory effort, no intercostal retractions, no accessory muscle use. Heart: RRR,  no murmur.  Left calf is larger in circumference by 1 inch. He does have some pretibial, soft edema. No TTP. Abdomen:  Not distended, soft, non-tender. No rebound or rigidity.  Skin: Not pale. Not jaundice Neurologic:  alert & oriented X3.  Speech normal, gait appropriate for age and unassisted Psych--  Cognition and judgment appear intact.  Cooperative with normal attention span and concentration.  Behavior appropriate. No anxious or depressed appearing.    Assessment & Plan:   Assessment  DM with neuropathy-CRI HTN Hyperlipidemia CRI congenital solitary kidney CAD MI 1987 AAA s/p repair Pulmonary fibrosis, noted in a CT, no previous eval by pulm, Chronic pulmonary crackles at bases. MSK: Dr Amil Amen  ---PSORIATIC arthritis on MTX ---DJD ---GOUT --diskitis 2010, on prednisone ---Pain mngmt: on oxycodone d/t neuropathy, UDS 10-2014 low risk  -- DEXA 08-2013 wnl OSA Limited CPAP tolerance Pneumonia, R side at age 76 , pleurisy, chest tube, rib surgery Anisocoria  (R pupil larger) L LEG DVT and small PE 01-18-2016 Left calf  larger by 1 inch since DVT 01-[facility]  PLAN:  Diabetes: Diet control, check A1c HTN: Seems well-controlled, continue Lasix. CRI: Last creatinine close  to baseline. Mild anemia: Check a CBC, iron and ferritin CAD: Continue controlling CV RF. Restart aspirin by May 2018 once he stopped Xarelto Left leg DVT 01/18/2016: Patient tolerates anticoagulation well. He just got a supply of Xarelto from the [place]. Continue Xarelto till 07/17/2016 then stop.  Pulmonary fibrosis: Asx FTT, weight loss: Resolving RTC 4 months

## 2016-05-22 NOTE — Progress Notes (Signed)
Pre visit review using our clinic review tool, if applicable. No additional management support is needed unless otherwise documented below in the visit note. 

## 2016-05-22 NOTE — Assessment & Plan Note (Signed)
Diabetes: Diet control, check A1c HTN: Seems well-controlled, continue Lasix. CRI: Last creatinine close to baseline. Mild anemia: Check a CBC, iron and ferritin CAD: Continue controlling CV RF. Restart aspirin by May 2018 once he stopped Xarelto Left leg DVT 01/18/2016: Patient tolerates anticoagulation well. He just got a supply of Xarelto from the [place]. Continue Xarelto till 07/17/2016 then stop.  Pulmonary fibrosis: Asx FTT, weight loss: Resolving RTC 4 months

## 2016-05-22 NOTE — Patient Instructions (Signed)
GO TO THE LAB : Get the blood work     GO TO THE FRONT DESK Schedule your next appointment for a  routine checkup in 4 months  Please see one of our RNs for Medicare wellness exam  Take Xarelto until 07/17/2016, then stop. The next day restart aspirin 81 mg daily

## 2016-07-06 ENCOUNTER — Telehealth: Payer: Self-pay | Admitting: Internal Medicine

## 2016-07-06 MED ORDER — OXYCODONE-ACETAMINOPHEN 5-325 MG PO TABS: 1.0000 | ORAL_TABLET | Freq: Two times a day (BID) | ORAL | 0 refills | Status: DC | PRN

## 2016-07-06 NOTE — Telephone Encounter (Signed)
Pt is requesting refill on Oxycodone.  Last OV: 05/22/2016 Last Fill: 05/08/2016 #60 and 0RF (For March and April 2018) UDS: 09/22/2015 Low risk  Please advise.

## 2016-07-06 NOTE — Telephone Encounter (Signed)
Okay #2 prescriptions

## 2016-07-06 NOTE — Telephone Encounter (Signed)
Please inform Pt that Rx has been placed at front desk for pick up at his convenience. Thank you.  

## 2016-07-06 NOTE — Telephone Encounter (Signed)
Rx's for May and June 2018 printed, awaiting MD signature.

## 2016-07-06 NOTE — Telephone Encounter (Signed)
Caller name: Relationship to patient: Self Can be reached: [phone]  Pharmacy:  Reason for call: Refill oxyCODONE-acetaminophen (PERCOCET/ROXICET) 5-325 MG tablet [[id number]

## 2016-07-07 NOTE — Telephone Encounter (Signed)
Patient informed. 

## 2016-07-11 DIAGNOSIS — Z85828 Personal history of other malignant neoplasm of skin: Secondary | ICD-10-CM | POA: Diagnosis not present

## 2016-07-11 DIAGNOSIS — L570 Actinic keratosis: Secondary | ICD-10-CM | POA: Diagnosis not present

## 2016-07-11 DIAGNOSIS — L814 Other melanin hyperpigmentation: Secondary | ICD-10-CM | POA: Diagnosis not present

## 2016-07-11 DIAGNOSIS — L821 Other seborrheic keratosis: Secondary | ICD-10-CM | POA: Diagnosis not present

## 2016-07-11 DIAGNOSIS — D225 Melanocytic nevi of trunk: Secondary | ICD-10-CM | POA: Diagnosis not present

## 2016-07-13 DIAGNOSIS — M2550 Pain in unspecified joint: Secondary | ICD-10-CM | POA: Diagnosis not present

## 2016-07-13 DIAGNOSIS — M150 Primary generalized (osteo)arthritis: Secondary | ICD-10-CM | POA: Diagnosis not present

## 2016-07-13 DIAGNOSIS — Z79899 Other long term (current) drug therapy: Secondary | ICD-10-CM | POA: Diagnosis not present

## 2016-07-13 DIAGNOSIS — Z6824 Body mass index (BMI) 24.0-24.9, adult: Secondary | ICD-10-CM | POA: Diagnosis not present

## 2016-07-13 DIAGNOSIS — L4059 Other psoriatic arthropathy: Secondary | ICD-10-CM | POA: Diagnosis not present

## 2016-07-13 DIAGNOSIS — N183 Chronic kidney disease, stage 3 (moderate): Secondary | ICD-10-CM | POA: Diagnosis not present

## 2016-07-13 DIAGNOSIS — M5136 Other intervertebral disc degeneration, lumbar region: Secondary | ICD-10-CM | POA: Diagnosis not present

## 2016-07-13 DIAGNOSIS — M1A09X1 Idiopathic chronic gout, multiple sites, with tophus (tophi): Secondary | ICD-10-CM | POA: Diagnosis not present

## 2016-07-26 ENCOUNTER — Telehealth: Payer: Self-pay | Admitting: Internal Medicine

## 2016-07-26 NOTE — Telephone Encounter (Signed)
Spoke with patient. He is not interested in scheduling awv at this time. Pt stated he will call office back when he is ready to schedule.

## 2016-08-10 DIAGNOSIS — M47817 Spondylosis without myelopathy or radiculopathy, lumbosacral region: Secondary | ICD-10-CM | POA: Diagnosis not present

## 2016-08-10 DIAGNOSIS — M47816 Spondylosis without myelopathy or radiculopathy, lumbar region: Secondary | ICD-10-CM | POA: Diagnosis not present

## 2016-08-23 DIAGNOSIS — M47816 Spondylosis without myelopathy or radiculopathy, lumbar region: Secondary | ICD-10-CM | POA: Diagnosis not present

## 2016-08-23 DIAGNOSIS — M25519 Pain in unspecified shoulder: Secondary | ICD-10-CM | POA: Diagnosis not present

## 2016-08-23 DIAGNOSIS — R030 Elevated blood-pressure reading, without diagnosis of hypertension: Secondary | ICD-10-CM | POA: Diagnosis not present

## 2016-08-23 DIAGNOSIS — M47817 Spondylosis without myelopathy or radiculopathy, lumbosacral region: Secondary | ICD-10-CM | POA: Diagnosis not present

## 2016-08-25 ENCOUNTER — Other Ambulatory Visit: Payer: Self-pay | Admitting: Internal Medicine

## 2016-08-29 ENCOUNTER — Ambulatory Visit (INDEPENDENT_AMBULATORY_CARE_PROVIDER_SITE_OTHER): Payer: PPO | Admitting: Internal Medicine

## 2016-08-29 ENCOUNTER — Ambulatory Visit (HOSPITAL_BASED_OUTPATIENT_CLINIC_OR_DEPARTMENT_OTHER)
Admission: RE | Admit: 2016-08-29 | Discharge: 2016-08-29 | Disposition: A | Discharge status: Home / Self Care | Payer: PPO | Source: Ambulatory Visit | Attending: Internal Medicine | Admitting: Internal Medicine

## 2016-08-29 ENCOUNTER — Encounter: Payer: Self-pay | Admitting: Internal Medicine

## 2016-08-29 VITALS — BP 126/62 | HR 72 | Temp 98.0°F | Resp 14 | Ht 68.0 in | Wt 157.1 lb

## 2016-08-29 DIAGNOSIS — R5383 Other fatigue: Secondary | ICD-10-CM

## 2016-08-29 DIAGNOSIS — R937 Abnormal findings on diagnostic imaging of other parts of musculoskeletal system: Secondary | ICD-10-CM | POA: Diagnosis not present

## 2016-08-29 DIAGNOSIS — M546 Pain in thoracic spine: Secondary | ICD-10-CM | POA: Diagnosis not present

## 2016-08-29 DIAGNOSIS — M549 Dorsalgia, unspecified: Secondary | ICD-10-CM | POA: Diagnosis not present

## 2016-08-29 LAB — COMPREHENSIVE METABOLIC PANEL
ALBUMIN: 3.5 g/dL (ref 3.5–5.2)
ALT: 32 U/L (ref 0–53)
AST: 45 U/L — AB (ref 0–37)
Alkaline Phosphatase: 69 U/L (ref 39–117)
BILIRUBIN TOTAL: 0.8 mg/dL (ref 0.2–1.2)
BUN: 37 mg/dL — AB (ref 6–23)
CO2: 29 meq/L (ref 19–32)
CREATININE: 1.42 mg/dL (ref 0.40–1.50)
Calcium: 10 mg/dL (ref 8.4–10.5)
Chloride: 95 mEq/L — ABNORMAL LOW (ref 96–112)
GFR: 50.08 mL/min — ABNORMAL LOW (ref >60.00)
Glucose, Bld: 264 mg/dL — ABNORMAL HIGH (ref 70–99)
Potassium: 4.5 mEq/L (ref 3.5–5.1)
SODIUM: 132 meq/L — AB (ref 135–145)
Total Protein: 6.4 g/dL (ref 6.0–8.3)

## 2016-08-29 LAB — CBC WITH DIFFERENTIAL/PLATELET
BASOS ABS: 0 10*3/uL (ref 0.0–0.1)
BASOS PCT: 0.2 % (ref 0.0–3.0)
EOS ABS: 0 10*3/uL (ref 0.0–0.7)
Eosinophils Relative: 0.1 % (ref 0.0–5.0)
HEMATOCRIT: 37.3 % — AB (ref 39.0–52.0)
Hemoglobin: 12.6 g/dL — ABNORMAL LOW (ref 13.0–17.0)
LYMPHS PCT: 6.9 % — AB (ref 12.0–46.0)
Lymphs Abs: 0.8 10*3/uL (ref 0.7–4.0)
MCHC: 33.8 g/dL (ref 30.0–36.0)
MCV: 104.6 fl — ABNORMAL HIGH (ref 78.0–100.0)
MONO ABS: 0.8 10*3/uL (ref 0.1–1.0)
Monocytes Relative: 7.3 % (ref 3.0–12.0)
NEUTROS ABS: 9.4 10*3/uL — AB (ref 1.4–7.7)
NEUTROS PCT: 85.5 % — AB (ref 43.0–77.0)
PLATELETS: 141 10*3/uL — AB (ref 150.0–400.0)
RBC: 3.57 Mil/uL — ABNORMAL LOW (ref 4.22–5.81)
RDW: 15.8 % — AB (ref 11.5–15.5)
WBC: 11 10*3/uL — AB (ref 4.0–10.5)

## 2016-08-29 LAB — TSH: TSH: 1.36 u[IU]/mL (ref 0.35–4.50)

## 2016-08-29 LAB — HEMOGLOBIN A1C: HEMOGLOBIN A1C: 6.7 % — AB (ref 4.6–6.5)

## 2016-08-29 NOTE — Progress Notes (Signed)
Subjective:    Patient ID: Derrick Fry, male    DOB: 14-Aug-1927, 83 y.o.   MRN: [id number]  DOS:  08/29/2016 Type of visit - description :  Acute visit Interval history: His main concern today is weakness, described as simply generalized lack of energy. This is going on for about a week. At the same time, he has developed pain between the shoulder blades, increase w/  deep breaths, no change by moving his torso. Also more noticeable at night. Denies any fall, injury. No neck pain. He has chronic shoulder pain bilaterally. At baseline.   Wt Readings from Last 3 Encounters:  08/29/16 157 lb 2 oz (71.3 kg)  05/22/16 157 lb 8 oz (71.4 kg)  03/27/16 154 lb 6 oz (70 kg)     Review of Systems Denies chest pain per se, no DOE. No palpitations. No fever chills or weight loss No cough or sputum production No nausea, vomiting, diarrhea  Past Medical History:  Diagnosis Date  . AAA (abdominal aortic aneurysm) ([place])   . Anemia   . Arthritis    "all over" (02/10/2016)  . Basal cell carcinoma of face    "burned off" (02/10/2016)  . CAD (coronary artery disease)    MI 08-04-85  . Chronic lower back pain   . Diabetes mellitus with neuropathy ([place])    "borderline" (02/10/2016)  . DVT (deep venous thrombosis) ([place]) 02/2016   "left thigh"  . Gait abnormality    chronic imbalance  . Gout    diskitis 10/2008, Dr Ouida Sills  . Heart murmur dx'd 02/2016  . Hyperlipidemia   . Hypertension   . Myocardial infarction ([place]) 1987   "before OHS"  . OSA (obstructive sleep apnea)    Limited CPAP tolerance (02/10/2016)  . Osteoarthritis   . Pneumonia 1938   had right pneumonia pleurisy requiring resection of ribs and chest tube drainage at age 42  . Pulmonary embolism ([place]) 02/2016   "left"  . Renal insufficiency    chronic w/ solitary kidney, congenital  . Small bowel obstruction ([place]) 04/01/2012    Past Surgical History:  Procedure Laterality Date  . ABDOMINAL AORTIC ANEURYSM REPAIR  ~ 1996     w/ iliac aneurysm repair i  . APPENDECTOMY    . CARDIAC CATHETERIZATION  1987   "before OHS"  . CARDIOVASCULAR STRESS TEST  10/13/2009   EF 57%  . CATARACT EXTRACTION W/ INTRAOCULAR LENS  IMPLANT, BILATERAL  09/1999,04/2003   right,left  . COLONOSCOPY    . CORONARY ARTERY BYPASS GRAFT  01/04/1986   CABG X5  . ESOPHAGOGASTRODUODENOSCOPY (EGD) WITH PROPOFOL N/A 02/22/2016   Procedure: ESOPHAGOGASTRODUODENOSCOPY (EGD) WITH PROPOFOL;  Surgeon: Milus Banister, MD;  Location: [facility];  Service: Endoscopy;  Laterality: N/A;  . INGUINAL HERNIA REPAIR Left 03/07/1983  . JOINT REPLACEMENT    . Knuckles replaced  05/2005   left hand  . NEPHRECTOMY Left 1996  . TONSILLECTOMY    . TOTAL KNEE ARTHROPLASTY Left 05/04/1989  . US ECHOCARDIOGRAPHY  01/14/2007   EF 55-60%    Social History   Social History  . Marital status: Married    Spouse name: N/A  . Number of children: 2  . Years of education: N/A   Occupational History  . retired Retired   Social History Main Topics  . Smoking status: Former Smoker    Packs/day: 1.00    Years: 30.00    Types: Cigarettes    Quit date: 04/02/1972  . Smokeless tobacco:  Never Used  . Alcohol use No     Comment: former heavy alcohol use  . Drug use: No  . Sexual activity: No   Other Topics Concern  . Not on file   Social History Narrative   Lost a son    Lives at home with wife    Still drives       Allergies as of 08/29/2016      Reactions   Colchicine    Unknown       Medication List       Accurate as of 08/29/16 11:59 PM. Always use your most recent med list.          atorvastatin 80 MG tablet Commonly known as:  LIPITOR Take 1 tablet (80 mg total) by mouth daily.   CENTRUM SILVER tablet Take 1 tablet by mouth daily.   cyanocobalamin 100 MCG tablet Take 100 mcg by mouth daily.   fish oil-omega-3 fatty acids 1000 MG capsule Take 2 g by mouth daily.   Flaxseed Oil 1000 MG Caps Take 1,000 mg by mouth 2 (two) times  daily.   folic acid 1 MG tablet Commonly known as:  FOLVITE Take 1 mg by mouth daily.   furosemide 40 MG tablet Commonly known as:  LASIX Take 1 tablet (40 mg total) by mouth daily.   gabapentin 400 MG capsule Commonly known as:  NEURONTIN Take 2 capsules (800 mg total) by mouth 3 (three) times daily.   glucosamine-chondroitin 500-400 MG tablet Take 1 tablet by mouth 3 (three) times daily.   glucose blood test strip Commonly known as:  ONE TOUCH ULTRA TEST Check blood sugar no more than twice daily.   methotrexate 2.5 MG tablet Commonly known as:  RHEUMATREX Take 15 mg by mouth once a week. Caution:Chemotherapy. Protect from light. Every Monday   nitroGLYCERIN 0.4 MG SL tablet Commonly known as:  NITROSTAT Place 1 tablet (0.4 mg total) under the tongue every 5 (five) minutes as needed for chest pain.   omeprazole 40 MG capsule Commonly known as:  PRILOSEC Take 1 capsule (40 mg total) by mouth daily.   onetouch ultrasoft lancets Check blood sugar no more than twice daily.   oxyCODONE-acetaminophen 5-325 MG tablet Commonly known as:  PERCOCET/ROXICET Take 1 tablet by mouth 2 (two) times daily as needed for severe pain.   oxyCODONE-acetaminophen 5-325 MG tablet Commonly known as:  PERCOCET/ROXICET Take 1 tablet by mouth 2 (two) times daily as needed for severe pain.   polyethylene glycol packet Commonly known as:  MIRALAX / GLYCOLAX Take 17 g by mouth as needed for mild constipation or moderate constipation.   predniSONE 5 MG tablet Commonly known as:  DELTASONE Take 2.5 mg by mouth daily.   PROBIOTIC-10 PO Take 1 tablet by mouth daily.   rivaroxaban 20 MG Tabs tablet Commonly known as:  XARELTO Take 1 tablet (20 mg total) by mouth daily with supper.   sucralfate 1 g tablet Commonly known as:  CARAFATE Take 1 tablet (1 g total) by mouth 4 (four) times daily -  with meals and at bedtime.   ULORIC 40 MG tablet Generic drug:  febuxostat Take 40 mg by mouth  daily.          Objective:   Physical Exam BP 126/62 (BP Location: Left Arm, Patient Position: Sitting, Cuff Size: Small)   Pulse 72   Temp 98 F (36.7 C) (Oral)   Resp 14   Ht 5\' 8"  (1.727 m)  Wt 157 lb 2 oz (71.3 kg)   SpO2 90%   BMI 23.89 kg/m  General:   Well developed, well nourished . NAD.  HEENT:  Normocephalic . Face symmetric, atraumatic. Neck: No TTP at the cervical spine Lungs:  Dry crackles at bases otherwise clear Normal respiratory effort, no intercostal retractions, no accessory muscle use. Heart: Regular?,  no murmur.  no pretibial edema bilaterally  Abdomen:  Not distended, soft, non-tender. No rebound or rigidity.   Skin: Not pale. Not jaundice Neurologic:  alert & oriented X3.  Speech normal, gait appropriate for age and unassisted Psych--  Cognition and judgment appear intact.  Cooperative with normal attention span and concentration.  Behavior appropriate. No anxious or depressed appearing.     Assessment & Plan:   Assessment  DM with neuropathy-CRI HTN Hyperlipidemia CRI congenital solitary kidney CAD MI 1987 AAA s/p repair Pulmonary fibrosis, noted in a CT, no previous eval by pulm, Chronic pulmonary crackles at bases. MSK: Dr Amil Amen  ---PSORIATIC arthritis on MTX ---DJD ---GOUT --diskitis 2010, on prednisone ---Pain mngmt: on oxycodone d/t neuropathy, UDS 10-2014 low risk  -- DEXA 08-2013 wnl OSA Limited CPAP tolerance Pneumonia, R side at age 77 , pleurisy, chest tube, rib surgery Anisocoria  (R pupil larger) L LEG DVT and small PE 01-18-2016 Left calf  larger by 1 inch since DVT 01-[facility]  PLAN:  Weakness: Etiology not completely clear, no headaches, fever, chills or weight loss. Recent B12 wnl. Will do general labs: CMP, CBC, TSH and reassess in ~3  Weeks (already has an appointment) EKG seems at baseline  Upper thoracic pain: No recent fall or injury, no respiratory symptoms. Check a x-ray of the  T-spine.

## 2016-08-29 NOTE — Patient Instructions (Addendum)
GO TO THE LAB : Get the blood work     STOP BY THE FIRST FLOOR:  get the XR    Continue with the same medications, call if the  symptoms get worse.  We'll see you in few weeks as scheduled

## 2016-08-29 NOTE — Progress Notes (Signed)
Pre visit review using our clinic review tool, if applicable. No additional management support is needed unless otherwise documented below in the visit note. 

## 2016-08-30 ENCOUNTER — Telehealth: Payer: Self-pay | Admitting: Internal Medicine

## 2016-08-30 NOTE — Telephone Encounter (Signed)
°  Relation to CH:EKBT Call back number:[phone]  Reason for call:  Patient inquiring about lab and imaging results,please advise

## 2016-08-31 NOTE — Telephone Encounter (Signed)
Notes recorded by Damita Dunnings, CMA on 08/31/2016 at 9:50 AM EDT Spoke w/ Pt, informed him of lab results and recommendations. Pt verbalized understanding. ------  Notes recorded by Colon Branch, MD on 08/31/2016 at 9:27 AM EDT Mild hyponatremia, slightly elevated white count, other labs okay. Advise patient: Diabetes is well controlled, other labs and x-ray okay. Suggest observation, he is to come back in 3 weeks for a follow-up, in the meantime if he feels worse, fever chills, headaches >>> needs to call the office.

## 2016-08-31 NOTE — Telephone Encounter (Signed)
Please advise 

## 2016-08-31 NOTE — Telephone Encounter (Signed)
See reports.  

## 2016-09-01 ENCOUNTER — Telehealth: Payer: Self-pay | Admitting: Internal Medicine

## 2016-09-01 MED ORDER — OXYCODONE-ACETAMINOPHEN 5-325 MG PO TABS: 1.0000 | ORAL_TABLET | Freq: Four times a day (QID) | ORAL | 0 refills | Status: DC | PRN

## 2016-09-01 NOTE — Telephone Encounter (Signed)
Please advise 

## 2016-09-01 NOTE — Telephone Encounter (Signed)
Caller name:BECKY Relation to VK:FMMCRF Call back [phone] Pharmacy:  Reason for call: Pt wife would like to make dr. Larose Kells aware that the pt is no better and she does not know what to do. States pt is still in a lot of pain and states he can not go the weekend like this. Please call and advise what she should do. States dr. Larose Kells had informed them on the last visit to let him know if he was no better

## 2016-09-01 NOTE — Telephone Encounter (Signed)
Spoke w/ Pt, informed him that Dr. Amil Amen out of office, and recommendations to go to ED if worse over weekend. Pt verbalized understanding.

## 2016-09-01 NOTE — Telephone Encounter (Signed)
Patient continue with severe thoracic pain. --CT chest 6 months ago with no aneurysm of the aorta --History of pulmonary emboli, he is anticoagulated. --This patient has a history of gout discitis, this could be another episodes of discitis if the pain is that severe Plan: Increase prednisone from 2.5 mg daily to: 10 mg daily 2, 7.5 mg daily 2, 5 mg daily 2, then go back to 2.5 daily. Increase oxycodone to 4 times a day when necessary. Watch for excessive somnolence X line I will reach Dr. Amil Amen Let the patient know.

## 2016-09-01 NOTE — Telephone Encounter (Signed)
Okay, rheumatology not available, advised patient ER if symptoms severe. Also ask for a call back next week to let me know how he is doing

## 2016-09-01 NOTE — Telephone Encounter (Signed)
Spoke w/ Jacqlyn Larsen, informed of recommendations. Needing refill of Oxycodone Rx. Will print Rx and place at front desk. Tried calling Dr. Amil Amen at [phone], he is out of the office on vacation for the week.

## 2016-09-03 ENCOUNTER — Emergency Department (HOSPITAL_COMMUNITY): Payer: PPO

## 2016-09-03 ENCOUNTER — Inpatient Hospital Stay (HOSPITAL_COMMUNITY)
Admission: EM | Admit: 2016-09-03 | Discharge: 2016-09-11 | DRG: 300 | Disposition: A | Discharge status: Home / Self Care | Payer: PPO | Attending: Internal Medicine | Admitting: Internal Medicine

## 2016-09-03 ENCOUNTER — Encounter (HOSPITAL_COMMUNITY): Payer: Self-pay

## 2016-09-03 DIAGNOSIS — M109 Gout, unspecified: Secondary | ICD-10-CM | POA: Diagnosis present

## 2016-09-03 DIAGNOSIS — G8929 Other chronic pain: Secondary | ICD-10-CM | POA: Diagnosis not present

## 2016-09-03 DIAGNOSIS — Z800 Family history of malignant neoplasm of digestive organs: Secondary | ICD-10-CM

## 2016-09-03 DIAGNOSIS — Z833 Family history of diabetes mellitus: Secondary | ICD-10-CM

## 2016-09-03 DIAGNOSIS — J9811 Atelectasis: Secondary | ICD-10-CM | POA: Diagnosis not present

## 2016-09-03 DIAGNOSIS — Z86718 Personal history of other venous thrombosis and embolism: Secondary | ICD-10-CM

## 2016-09-03 DIAGNOSIS — L4050 Arthropathic psoriasis, unspecified: Secondary | ICD-10-CM | POA: Diagnosis present

## 2016-09-03 DIAGNOSIS — Z888 Allergy status to other drugs, medicaments and biological substances status: Secondary | ICD-10-CM | POA: Diagnosis not present

## 2016-09-03 DIAGNOSIS — Z86711 Personal history of pulmonary embolism: Secondary | ICD-10-CM

## 2016-09-03 DIAGNOSIS — Z961 Presence of intraocular lens: Secondary | ICD-10-CM | POA: Diagnosis not present

## 2016-09-03 DIAGNOSIS — R931 Abnormal findings on diagnostic imaging of heart and coronary circulation: Secondary | ICD-10-CM | POA: Diagnosis not present

## 2016-09-03 DIAGNOSIS — Z85828 Personal history of other malignant neoplasm of skin: Secondary | ICD-10-CM

## 2016-09-03 DIAGNOSIS — M549 Dorsalgia, unspecified: Secondary | ICD-10-CM | POA: Diagnosis not present

## 2016-09-03 DIAGNOSIS — Z8249 Family history of ischemic heart disease and other diseases of the circulatory system: Secondary | ICD-10-CM

## 2016-09-03 DIAGNOSIS — Z951 Presence of aortocoronary bypass graft: Secondary | ICD-10-CM

## 2016-09-03 DIAGNOSIS — I350 Nonrheumatic aortic (valve) stenosis: Secondary | ICD-10-CM | POA: Diagnosis not present

## 2016-09-03 DIAGNOSIS — M6281 Muscle weakness (generalized): Secondary | ICD-10-CM | POA: Diagnosis not present

## 2016-09-03 DIAGNOSIS — I169 Hypertensive crisis, unspecified: Secondary | ICD-10-CM | POA: Diagnosis not present

## 2016-09-03 DIAGNOSIS — I71019 Dissection of thoracic aorta, unspecified: Secondary | ICD-10-CM

## 2016-09-03 DIAGNOSIS — Z66 Do not resuscitate: Secondary | ICD-10-CM | POA: Diagnosis present

## 2016-09-03 DIAGNOSIS — E871 Hypo-osmolality and hyponatremia: Secondary | ICD-10-CM | POA: Diagnosis not present

## 2016-09-03 DIAGNOSIS — Z96652 Presence of left artificial knee joint: Secondary | ICD-10-CM | POA: Diagnosis present

## 2016-09-03 DIAGNOSIS — Z9842 Cataract extraction status, left eye: Secondary | ICD-10-CM | POA: Diagnosis not present

## 2016-09-03 DIAGNOSIS — Z7952 Long term (current) use of systemic steroids: Secondary | ICD-10-CM

## 2016-09-03 DIAGNOSIS — M25561 Pain in right knee: Secondary | ICD-10-CM | POA: Diagnosis present

## 2016-09-03 DIAGNOSIS — Z9841 Cataract extraction status, right eye: Secondary | ICD-10-CM

## 2016-09-03 DIAGNOSIS — E1140 Type 2 diabetes mellitus with diabetic neuropathy, unspecified: Secondary | ICD-10-CM | POA: Diagnosis not present

## 2016-09-03 DIAGNOSIS — M546 Pain in thoracic spine: Secondary | ICD-10-CM | POA: Insufficient documentation

## 2016-09-03 DIAGNOSIS — M545 Low back pain: Secondary | ICD-10-CM | POA: Diagnosis present

## 2016-09-03 DIAGNOSIS — E875 Hyperkalemia: Secondary | ICD-10-CM | POA: Diagnosis present

## 2016-09-03 DIAGNOSIS — Z807 Family history of other malignant neoplasms of lymphoid, hematopoietic and related tissues: Secondary | ICD-10-CM

## 2016-09-03 DIAGNOSIS — G4733 Obstructive sleep apnea (adult) (pediatric): Secondary | ICD-10-CM | POA: Diagnosis not present

## 2016-09-03 DIAGNOSIS — Z87891 Personal history of nicotine dependence: Secondary | ICD-10-CM | POA: Diagnosis not present

## 2016-09-03 DIAGNOSIS — I701 Atherosclerosis of renal artery: Secondary | ICD-10-CM | POA: Diagnosis not present

## 2016-09-03 DIAGNOSIS — I119 Hypertensive heart disease without heart failure: Secondary | ICD-10-CM | POA: Diagnosis present

## 2016-09-03 DIAGNOSIS — Z801 Family history of malignant neoplasm of trachea, bronchus and lung: Secondary | ICD-10-CM

## 2016-09-03 DIAGNOSIS — I7101 Dissection of thoracic aorta: Secondary | ICD-10-CM

## 2016-09-03 DIAGNOSIS — Z823 Family history of stroke: Secondary | ICD-10-CM

## 2016-09-03 DIAGNOSIS — Z8679 Personal history of other diseases of the circulatory system: Secondary | ICD-10-CM

## 2016-09-03 DIAGNOSIS — N179 Acute kidney failure, unspecified: Secondary | ICD-10-CM | POA: Diagnosis present

## 2016-09-03 DIAGNOSIS — I10 Essential (primary) hypertension: Secondary | ICD-10-CM | POA: Diagnosis not present

## 2016-09-03 DIAGNOSIS — I429 Cardiomyopathy, unspecified: Secondary | ICD-10-CM | POA: Diagnosis present

## 2016-09-03 DIAGNOSIS — R2681 Unsteadiness on feet: Secondary | ICD-10-CM | POA: Diagnosis not present

## 2016-09-03 DIAGNOSIS — I7100 Dissection of unspecified site of aorta: Principal | ICD-10-CM | POA: Diagnosis present

## 2016-09-03 DIAGNOSIS — Z79899 Other long term (current) drug therapy: Secondary | ICD-10-CM

## 2016-09-03 DIAGNOSIS — R488 Other symbolic dysfunctions: Secondary | ICD-10-CM | POA: Diagnosis not present

## 2016-09-03 DIAGNOSIS — I359 Nonrheumatic aortic valve disorder, unspecified: Secondary | ICD-10-CM | POA: Diagnosis not present

## 2016-09-03 DIAGNOSIS — R9430 Abnormal result of cardiovascular function study, unspecified: Secondary | ICD-10-CM | POA: Diagnosis not present

## 2016-09-03 DIAGNOSIS — I252 Old myocardial infarction: Secondary | ICD-10-CM

## 2016-09-03 DIAGNOSIS — Z905 Acquired absence of kidney: Secondary | ICD-10-CM | POA: Diagnosis not present

## 2016-09-03 DIAGNOSIS — E1165 Type 2 diabetes mellitus with hyperglycemia: Secondary | ICD-10-CM | POA: Diagnosis present

## 2016-09-03 DIAGNOSIS — R918 Other nonspecific abnormal finding of lung field: Secondary | ICD-10-CM | POA: Diagnosis not present

## 2016-09-03 DIAGNOSIS — I493 Ventricular premature depolarization: Secondary | ICD-10-CM | POA: Diagnosis present

## 2016-09-03 DIAGNOSIS — I7103 Dissection of thoracoabdominal aorta: Secondary | ICD-10-CM | POA: Diagnosis not present

## 2016-09-03 DIAGNOSIS — R278 Other lack of coordination: Secondary | ICD-10-CM | POA: Diagnosis not present

## 2016-09-03 HISTORY — DX: Dissection of unspecified site of aorta: I71.00

## 2016-09-03 LAB — COMPREHENSIVE METABOLIC PANEL
ALBUMIN: 2.3 g/dL — AB (ref 3.5–5.0)
ALT: 117 U/L — ABNORMAL HIGH (ref 17–63)
ANION GAP: 8 (ref 5–15)
AST: 138 U/L — ABNORMAL HIGH (ref 15–41)
Alkaline Phosphatase: 88 U/L (ref 38–126)
BUN: 42 mg/dL — ABNORMAL HIGH (ref 6–20)
CHLORIDE: 98 mmol/L — AB (ref 101–111)
CO2: 26 mmol/L (ref 22–32)
Calcium: 9 mg/dL (ref 8.9–10.3)
Creatinine, Ser: 1.49 mg/dL — ABNORMAL HIGH (ref 0.61–1.24)
GFR calc Af Amer: 47 mL/min — ABNORMAL LOW (ref >60)
GFR, EST NON AFRICAN AMERICAN: 40 mL/min — AB (ref >60)
Glucose, Bld: 130 mg/dL — ABNORMAL HIGH (ref 65–99)
Potassium: 4.1 mmol/L (ref 3.5–5.1)
Sodium: 132 mmol/L — ABNORMAL LOW (ref 135–145)
TOTAL PROTEIN: 6.1 g/dL — AB (ref 6.5–8.1)
Total Bilirubin: 0.7 mg/dL (ref 0.3–1.2)

## 2016-09-03 LAB — GLUCOSE, CAPILLARY
GLUCOSE-CAPILLARY: 100 mg/dL — AB (ref 65–99)
Glucose-Capillary: 113 mg/dL — ABNORMAL HIGH (ref 65–99)

## 2016-09-03 LAB — CBC WITH DIFFERENTIAL/PLATELET
BASOS ABS: 0 10*3/uL (ref 0.0–0.1)
Basophils Relative: 0 %
Eosinophils Absolute: 0 10*3/uL (ref 0.0–0.7)
Eosinophils Relative: 0 %
HEMATOCRIT: 31.8 % — AB (ref 39.0–52.0)
HEMOGLOBIN: 10.9 g/dL — AB (ref 13.0–17.0)
LYMPHS PCT: 7 %
Lymphs Abs: 0.5 10*3/uL — ABNORMAL LOW (ref 0.7–4.0)
MCH: 34.3 pg — ABNORMAL HIGH (ref 26.0–34.0)
MCHC: 34.3 g/dL (ref 30.0–36.0)
MCV: 100 fL (ref 78.0–100.0)
Monocytes Absolute: 0.4 10*3/uL (ref 0.1–1.0)
Monocytes Relative: 6 %
NEUTROS ABS: 5.7 10*3/uL (ref 1.7–7.7)
Neutrophils Relative %: 88 %
Platelets: 122 10*3/uL — ABNORMAL LOW (ref 150–400)
RBC: 3.18 MIL/uL — AB (ref 4.22–5.81)
RDW: 15.9 % — ABNORMAL HIGH (ref 11.5–15.5)
WBC: 6.6 10*3/uL (ref 4.0–10.5)

## 2016-09-03 LAB — I-STAT TROPONIN, ED: Troponin i, poc: 0.12 ng/mL (ref 0.00–0.08)

## 2016-09-03 LAB — CBG MONITORING, ED: Glucose-Capillary: 90 mg/dL (ref 65–99)

## 2016-09-03 LAB — D-DIMER, QUANTITATIVE: D-Dimer, Quant: >20 ug/mL-FEU — ABNORMAL HIGH (ref 0.00–0.50)

## 2016-09-03 MED ORDER — ACETAMINOPHEN 325 MG PO TABS: 650.0000 mg | ORAL_TABLET | ORAL | Status: DC | PRN

## 2016-09-03 MED ORDER — SODIUM CHLORIDE 0.9 % IV SOLN: 250.0000 mL | INTRAVENOUS | Status: DC | PRN

## 2016-09-03 MED ORDER — INSULIN ASPART 100 UNIT/ML ~~LOC~~ SOLN
2.0000 [IU] | SUBCUTANEOUS | Status: DC
  Administered 2016-09-04 (×3): 6 [IU] via SUBCUTANEOUS
  Administered 2016-09-05: 2 [IU] via SUBCUTANEOUS
  Administered 2016-09-05: 4 [IU] via SUBCUTANEOUS
  Administered 2016-09-05 (×2): 6 [IU] via SUBCUTANEOUS

## 2016-09-03 MED ORDER — ESMOLOL HCL-SODIUM CHLORIDE 2000 MG/100ML IV SOLN
25.0000 ug/kg/min | Freq: Once | INTRAVENOUS | Status: AC
  Filled 2016-09-03: qty 100

## 2016-09-03 MED ORDER — DOCUSATE SODIUM 50 MG/5ML PO LIQD
100.0000 mg | Freq: Two times a day (BID) | ORAL | Status: DC | PRN
  Filled 2016-09-03: qty 10

## 2016-09-03 MED ORDER — FENTANYL CITRATE (PF) 100 MCG/2ML IJ SOLN
50.0000 ug | Freq: Once | INTRAMUSCULAR | Status: AC
  Administered 2016-09-03: 50 ug via INTRAVENOUS
  Filled 2016-09-03: qty 2

## 2016-09-03 MED ORDER — FAMOTIDINE IN NACL 20-0.9 MG/50ML-% IV SOLN
20.0000 mg | Freq: Two times a day (BID) | INTRAVENOUS | Status: DC
  Administered 2016-09-03 – 2016-09-04 (×3): 20 mg via INTRAVENOUS
  Filled 2016-09-03 (×3): qty 50

## 2016-09-03 MED ORDER — MORPHINE SULFATE (PF) 2 MG/ML IV SOLN
2.0000 mg | INTRAVENOUS | Status: DC | PRN
  Administered 2016-09-03 – 2016-09-06 (×12): 2 mg via INTRAVENOUS
  Administered 2016-09-08: 4 mg via INTRAVENOUS
  Filled 2016-09-03 (×5): qty 1
  Filled 2016-09-03: qty 2
  Filled 2016-09-03 (×3): qty 1
  Filled 2016-09-03: qty 2
  Filled 2016-09-03 (×3): qty 1

## 2016-09-03 MED ORDER — OXYCODONE-ACETAMINOPHEN 5-325 MG PO TABS
1.0000 | ORAL_TABLET | Freq: Four times a day (QID) | ORAL | Status: DC | PRN
  Administered 2016-09-06 – 2016-09-11 (×9): 1 via ORAL
  Filled 2016-09-03 (×10): qty 1

## 2016-09-03 MED ORDER — METHOTREXATE 2.5 MG PO TABS: 15.0000 mg | ORAL_TABLET | ORAL | Status: DC

## 2016-09-03 MED ORDER — ONDANSETRON HCL 4 MG/2ML IJ SOLN: 4.0000 mg | Freq: Four times a day (QID) | INTRAMUSCULAR | Status: DC | PRN

## 2016-09-03 MED ORDER — PREDNISONE 5 MG PO TABS
2.5000 mg | ORAL_TABLET | Freq: Every day | ORAL | Status: DC
  Administered 2016-09-04 – 2016-09-11 (×8): 2.5 mg via ORAL
  Filled 2016-09-03 (×8): qty 1

## 2016-09-03 MED ORDER — FENTANYL CITRATE (PF) 100 MCG/2ML IJ SOLN: 50.0000 ug | INTRAMUSCULAR | Status: DC | PRN

## 2016-09-03 MED ORDER — GABAPENTIN 100 MG PO CAPS
200.0000 mg | ORAL_CAPSULE | Freq: Three times a day (TID) | ORAL | Status: DC
  Administered 2016-09-03 – 2016-09-11 (×23): 200 mg via ORAL
  Filled 2016-09-03 (×24): qty 2

## 2016-09-03 MED ORDER — FENTANYL CITRATE (PF) 100 MCG/2ML IJ SOLN
50.0000 ug | INTRAMUSCULAR | Status: DC | PRN
Start: 2016-09-03 — End: 2016-09-04
  Administered 2016-09-04: 50 ug via INTRAVENOUS
  Filled 2016-09-03 (×2): qty 2

## 2016-09-03 MED ORDER — IOPAMIDOL (ISOVUE-370) INJECTION 76%
INTRAVENOUS | Status: AC
  Administered 2016-09-03: 80 mL via INTRAVENOUS
  Filled 2016-09-03: qty 100

## 2016-09-03 MED ORDER — NITROPRUSSIDE SODIUM 25 MG/ML IV SOLN
0.0000 ug/kg/min | INTRAVENOUS | Status: DC
  Administered 2016-09-03: 1 ug/kg/min via INTRAVENOUS
  Administered 2016-09-03: 0.6 ug/kg/min via INTRAVENOUS
  Filled 2016-09-03: qty 2

## 2016-09-03 MED ORDER — ESMOLOL HCL-SODIUM CHLORIDE 2000 MG/100ML IV SOLN
25.0000 ug/kg/min | Freq: Once | INTRAVENOUS | Status: AC
  Administered 2016-09-03: 100 ug/kg/min via INTRAVENOUS
  Filled 2016-09-03 (×2): qty 100

## 2016-09-03 NOTE — ED Notes (Signed)
Bed: LK95 Expected date:  Expected time:  Means of arrival:  Comments: EMS- 83 yo back pain

## 2016-09-03 NOTE — ED Provider Notes (Signed)
[facility] DEPT Provider Note   CSN: [id number] Arrival date & time: 09/03/16  1045     History   Chief Complaint Chief Complaint  Patient presents with  . Back Pain    HPI Derrick Fry is a 83 y.o. male.  HPI Patient presents with back pain. It is in his thoracic back and his had it for a week. Has been seen by primary care doctor for it. Had x-ray that was reportedly reassuring. He is on chronic prednisone and methotrexate for his psoriatic arthritis. Previously had low back discitis from gout also previous owner embolisms. States this feels more like the gout in his back. He does however states it hurts more to take breath. Does not feel short of breath. Pain is improved by his oxycodone. Sees Dr. Larose Kells. States he feels weak all over and not localizing. No headache. No fevers. Past Medical History:  Diagnosis Date  . AAA (abdominal aortic aneurysm) ([place])   . Anemia   . Arthritis    "all over" (02/10/2016)  . Basal cell carcinoma of face    "burned off" (02/10/2016)  . CAD (coronary artery disease)    MI [birth year]  . Chronic lower back pain   . Diabetes mellitus with neuropathy ([place])    "borderline" (02/10/2016)  . DVT (deep venous thrombosis) ([place]) 02/2016   "left thigh"  . Gait abnormality    chronic imbalance  . Gout    diskitis 10/2008, Dr Ouida Sills  . Heart murmur dx'd 02/2016  . Hyperlipidemia   . Hypertension   . Myocardial infarction ([place]) 1987   "before OHS"  . OSA (obstructive sleep apnea)    Limited CPAP tolerance (02/10/2016)  . Osteoarthritis   . Pneumonia 1938   had right pneumonia pleurisy requiring resection of ribs and chest tube drainage at age 54  . Pulmonary embolism ([place]) 02/2016   "left"  . Renal insufficiency    chronic w/ solitary kidney, congenital  . Small bowel obstruction ([place]) 04/01/2012    Patient Active Problem List   Diagnosis Date Noted  . Abdominal pain, chronic, epigastric   . Gastritis and gastroduodenitis   . Diarrhea   .  Pulmonary embolus ([place])   . Pulmonary embolism ([place]) 02/09/2016  . History of DVT (deep vein thrombosis) 02/09/2016  . Nausea, vomiting and diarrhea 02/09/2016  . Failure to thrive in adult 02/09/2016  . Hyponatremia 02/09/2016  . Loss of weight 02/09/2016  . Protein calorie malnutrition ([place]) 02/09/2016  . Arrhythmia 02/09/2016  . PCP NOTES >>>>> 11/20/2014  . Buzzing in ear 06/04/2013  . Pulmonary fibrosis ([place]) 01/29/2013  . Annual physical exam 07/11/2010  . AAA (abdominal aortic aneurysm) ([place]) 07/11/2010  . High cholesterol 03/23/2009  . Gout 08/22/2007  . GAIT DISTURBANCE 08/22/2007  . Osteoarthritis  04/23/2007  . OSA (obstructive sleep apnea) 01/03/2007  . DM II (diabetes mellitus, type II), controlled ([place]) 09/05/2006  .  peripheral neuropathy --UDS--pain mngmt  09/05/2006  . Essential hypertension 09/05/2006  . CAD (coronary artery disease) 09/05/2006  . RENAL INSUFFICIENCY, CHRONIC 09/05/2006  . SOLITARY KIDNEY, CONGENITAL 09/05/2006  . SLEEP APNEA 09/05/2006    Past Surgical History:  Procedure Laterality Date  . ABDOMINAL AORTIC ANEURYSM REPAIR  ~ 1996   w/ iliac aneurysm repair i  . APPENDECTOMY    . CARDIAC CATHETERIZATION  1987   "before OHS"  . CARDIOVASCULAR STRESS TEST  10/13/2009   EF 57%  . CATARACT EXTRACTION W/ INTRAOCULAR LENS  IMPLANT, BILATERAL  09/1999,04/2003  right,left  . COLONOSCOPY    . CORONARY ARTERY BYPASS GRAFT  01/04/1986   CABG X5  . ESOPHAGOGASTRODUODENOSCOPY (EGD) WITH PROPOFOL N/A 02/22/2016   Procedure: ESOPHAGOGASTRODUODENOSCOPY (EGD) WITH PROPOFOL;  Surgeon: Milus Banister, MD;  Location: [facility];  Service: Endoscopy;  Laterality: N/A;  . INGUINAL HERNIA REPAIR Left 03/07/1983  . JOINT REPLACEMENT    . Knuckles replaced  05/2005   left hand  . NEPHRECTOMY Left 1996  . TONSILLECTOMY    . TOTAL KNEE ARTHROPLASTY Left 05/04/1989  . US ECHOCARDIOGRAPHY  01/14/2007   EF 55-60%       Home Medications    Prior to  Admission medications   Medication Sig Start Date End Date Taking? Authorizing Provider  atorvastatin (LIPITOR) 80 MG tablet Take 1 tablet (80 mg total) by mouth daily. 08/25/16  Yes Paz, Alda Berthold, MD  cyanocobalamin 100 MCG tablet Take 100 mcg by mouth daily.    Yes [provider]  febuxostat (ULORIC) 40 MG tablet Take 40 mg by mouth daily.    Yes [provider]  fish oil-omega-3 fatty acids 1000 MG capsule Take 2 g by mouth daily.    Yes [provider]  Flaxseed, Linseed, (FLAXSEED OIL) 1000 MG CAPS Take 1,000 mg by mouth 2 (two) times daily.    Yes [provider]  folic acid (FOLVITE) 1 MG tablet Take 1 mg by mouth daily.   Yes [provider]  furosemide (LASIX) 40 MG tablet Take 1 tablet (40 mg total) by mouth daily. 05/15/16  Yes Paz, Alda Berthold, MD  gabapentin (NEURONTIN) 400 MG capsule Take 2 capsules (800 mg total) by mouth 3 (three) times daily. 02/29/16  Yes Paz, Alda Berthold, MD  glucosamine-chondroitin 500-400 MG tablet Take 1 tablet by mouth daily.    Yes [provider]  methotrexate (RHEUMATREX) 2.5 MG tablet Take 15 mg by mouth once a week. Caution:Chemotherapy. Protect from light. Every Monday   Yes [provider]  Multiple Vitamins-Minerals (CENTRUM SILVER) tablet Take 1 tablet by mouth daily.    Yes [provider]  omeprazole (PRILOSEC) 40 MG capsule Take 1 capsule (40 mg total) by mouth daily. 02/15/16  Yes Milus Banister, MD  oxyCODONE-acetaminophen (PERCOCET/ROXICET) 5-325 MG tablet Take 1 tablet by mouth 4 (four) times daily as needed for severe pain. 09/01/16  Yes Paz, Alda Berthold, MD  polyethylene glycol [facility] / Floria Raveling) packet Take 17 g by mouth as needed for mild constipation or moderate constipation.    Yes [provider]  predniSONE (DELTASONE) 5 MG tablet Take 2.5 mg by mouth daily.    Yes [provider]  Probiotic Product (PROBIOTIC-10 PO) Take 1 tablet by mouth daily.   Yes [provider]  glucose blood (ONE TOUCH ULTRA TEST) test strip Check blood sugar no more than twice daily. Patient not taking: Reported on 03/15/2016 07/20/14   Colon Branch, MD  Lancets [facility] ULTRASOFT) lancets Check blood sugar no more than twice daily. Patient not taking: Reported on 03/15/2016 07/20/14   Colon Branch, MD  nitroGLYCERIN (NITROSTAT) 0.4 MG SL tablet Place 1 tablet (0.4 mg total) under the tongue every 5 (five) minutes as needed for chest pain. Patient not taking: Reported on 03/27/2016 08/05/15   Nahser, Wonda Cheng, MD  rivaroxaban (XARELTO) 20 MG TABS tablet Take 1 tablet (20 mg total) by mouth daily with supper. Patient not taking: Reported on 09/03/2016 05/19/16   Colon Branch, MD  sucralfate ([place])  1 g tablet Take 1 tablet (1 g total) by mouth 4 (four) times daily -  with meals and at bedtime. Patient not taking: Reported on 09/03/2016 02/18/16   Blanchie Dessert, MD    Family History Family History  Problem Relation Age of Onset  . Heart disease Father   . Lymphoma Sister   . Liver cancer Brother   . Lung cancer Brother   . Ulcerative colitis Brother   . Heart disease Brother   . Diabetes Brother   . Colon cancer Neg Hx   . Prostate cancer Neg Hx   . Stomach cancer Neg Hx   . Esophageal cancer Neg Hx   . Rectal cancer Neg Hx     Social History Social History  Substance Use Topics  . Smoking status: Former Smoker    Packs/day: 1.00    Years: 30.00    Types: Cigarettes    Quit date: 04/02/1972  . Smokeless tobacco: Never Used  . Alcohol use No     Comment: former heavy alcohol use     Allergies   Colchicine   Review of Systems Review of Systems  Constitutional: Negative for appetite change and diaphoresis.  HENT: Negative for congestion.   Respiratory: Positive for shortness of breath.   Gastrointestinal: Negative for abdominal distention.  Genitourinary: Negative for flank pain.  Musculoskeletal: Positive for back pain.  Neurological: Negative  for numbness.  Hematological: Negative for adenopathy.  Psychiatric/Behavioral: Negative for confusion.     Physical Exam Updated Vital Signs BP 137/63 (BP Location: Right Arm)   Pulse 87   Temp 98.7 F (37.1 C) (Oral)   Resp 16   SpO2 95%   Physical Exam  Constitutional: He appears well-developed.  HENT:  Head: Atraumatic.  Eyes: EOM are normal.  Neck: Neck supple.  Cardiovascular: Normal rate.   Pulmonary/Chest: Effort normal. He has no wheezes. He has no rales.  Musculoskeletal: He exhibits tenderness. He exhibits no edema.  Tenderness in the left mid thoracic spine in the paraspinal area. No rash. No swelling.  Neurological:  Sensation intact in bilateral lower extremities. Strength grossly intact in bilateral lower and upper extremities.  Skin: Skin is warm. Capillary refill takes less than 2 seconds.     ED Treatments / Results  Labs (all labs ordered are listed, but only abnormal results are displayed) Labs Reviewed  COMPREHENSIVE METABOLIC PANEL - Abnormal; Notable for the following:       Result Value   Sodium 132 (*)    Chloride 98 (*)    Glucose, Bld 130 (*)    BUN 42 (*)    Creatinine, Ser 1.49 (*)    Total Protein 6.1 (*)    Albumin 2.3 (*)    AST 138 (*)    ALT 117 (*)    GFR calc non Af Amer 40 (*)    GFR calc Af Amer 47 (*)    All other components within normal limits  CBC WITH DIFFERENTIAL/PLATELET - Abnormal; Notable for the following:    RBC 3.18 (*)    Hemoglobin 10.9 (*)    HCT 31.8 (*)    MCH 34.3 (*)    RDW 15.9 (*)    Platelets 122 (*)    Lymphs Abs 0.5 (*)    All other components within normal limits  D-DIMER, QUANTITATIVE (NOT AT [facility]) - Abnormal; Notable for the following:    D-Dimer, Quant >20.00 (*)    All other components within normal limits  I-STAT TROPOININ,  ED - Abnormal; Notable for the following:    Troponin i, poc 0.12 (*)    All other components within normal limits    EKG  EKG  Interpretation  Date/Time:  Sunday September 03 2016 11:30:56 EDT Ventricular Rate:  85 PR Interval:    QRS Duration: 121 QT Interval:  346 QTC Calculation: 412 R Axis:   -64 Text Interpretation:  Sinus tachycardia Atrial premature complexes LVH with IVCD, LAD and secondary repol abnrm Confirmed by Alvino Chapel  MD, Johnathan Heskett [phone]) on 09/03/2016 11:47:16 AM       Radiology Dg Chest 2 View  Result Date: 09/03/2016 CLINICAL DATA:  Back pain EXAM: CHEST  2 VIEW COMPARISON:  08/29/2016 FINDINGS: Prior CABG. Heart is borderline in size. Mild peribronchial thickening and interstitial prominence. Scarring in the left base. No acute opacities or effusions. No acute bony abnormality. IMPRESSION: Chronic bronchitic changes. Left basilar scarring. No active disease. Electronically Signed   By: Rolm Baptise M.D.   On: 09/03/2016 12:02   Ct Angio Chest Pe W And/or Wo Contrast  Result Date: 09/03/2016 CLINICAL DATA:  Upper back pain. Clinical concern for pulmonary embolus versus dissection. EXAM: CT ANGIOGRAPHY CHEST WITH CONTRAST TECHNIQUE: Multidetector CT imaging of the chest was performed using the standard protocol during bolus administration of intravenous contrast. Multiplanar CT image reconstructions and MIPs were obtained to evaluate the vascular anatomy. CONTRAST:  80 cc Isovue 370 intravenously. COMPARISON:  None. FINDINGS: Cardiovascular: Satisfactory opacification of the pulmonary arteries to the segmental level. No evidence of pulmonary embolism. Enlarged heart. No pericardial effusion. Heavy calcific atherosclerotic disease of the coronary arteries. Postsurgical changes from CABG. The thoracic aorta is very tortuous, with an ectatic portion of the descending thoracic aorta measuring 3.8 cm in greatest diameter. Heavy calcified and noncalcified plaque is seen along the aorta with asymmetric noncalcified plaque versus blood clot along the posterior wall of the descending aorta. There is a small amount of high  density material along the medial wall of the descending aorta, image 73/sequence 10 extending inferiorly to the level of the mid thorax. Low fluid density material is seen along the posterior wall of the aorta in the distal thorax. Mediastinum/Nodes: No enlarged mediastinal, hilar, or axillary lymph nodes. Thyroid gland, trachea, and esophagus demonstrate no significant findings. Lungs/Pleura: Low lung volumes with mild interstitial lung changes and hypoventilatory changes in the lower lobes. Upper Abdomen: No acute abnormality. Musculoskeletal: No chest wall abnormality. No acute or significant osseous findings. Review of the MIP images confirms the above findings. IMPRESSION: Ectatic aorta with eccentric calcified and noncalcified plaque versus intramural blood clot, especially along the posterior wall of the descending aorta. High density material seen along the medial wall of the descending aorta, concerning for acute aortic syndrome, possibly small area of dissection or penetrating ulcer, which is not well visualized on this study, targeting better visualization of the pulmonary arteries. Surgical consultation is recommended. CT angiogram with and without contrast to better visualize the thoracic aorta may be considered if found clinically feasible. Aortic Atherosclerosis (ICD10-I70.0). No evidence of pulmonary embolus. Enlarged heart with heavily calcified coronary arteries. These results were called by telephone at the time of interpretation on 09/03/2016 at 2:16 pm to Dr. Davonna Belling , who verbally acknowledged these results. Electronically Signed   By: Fidela Salisbury M.D.   On: 09/03/2016 14:22    Procedures Procedures (including critical care time)  Medications Ordered in ED Medications  esmolol (BREVIBLOC) 2000 mg / 100 mL (20 mg/mL) infusion (not  administered)  iopamidol (ISOVUE-370) 76 % injection (80 mLs Intravenous Contrast Given 09/03/16 1331)     Initial Impression / Assessment  and Plan / ED Course  I have reviewed the triage vital signs and the nursing notes.  Pertinent labs & imaging results that were available during my care of the patient were reviewed by me and considered in my medical decision making (see chart for details).      Patient presents with back pain. Has had for last few days worse today. Troponin mildly elevated. EKG has PACs otherwise reassuring. Does not appear ischemic. D-dimer elevated. CTA  With PE protocol done and showed  Descending  Aortic dissection. Patient had been on Xarelto. Had told me was on but apparently it stopped around 2 weeks ago. Should no longer be anticoagulated. Discussed with Dr. Caffie Pinto from cardiothoracic surgery. He was reviewing the images. Blood pressure control. Reversal of anticoagulation if he had still been on it. Admit to ICU [facility] at [facility]. Blood pressure mildly elevated.  CRITICAL CARE Performed by: Mackie Pai Total critical care time: 40 minutes Critical care time was exclusive of separately billable procedures and treating other patients. Critical care was necessary to treat or prevent imminent or life-threatening deterioration. Critical care was time spent personally by me on the following activities: development of treatment plan with patient and/or surrogate as well as nursing, discussions with consultants, evaluation of patient's response to treatment, examination of patient, obtaining history from patient or surrogate, ordering and performing treatments and interventions, ordering and review of laboratory studies, ordering and review of radiographic studies, pulse oximetry and re-evaluation of patient's condition.   Final Clinical Impressions(s) / ED Diagnoses   Final diagnoses:  Aortic dissection distal to left subclavian [facility])    New Prescriptions New Prescriptions   No medications on file     Davonna Belling, MD 09/03/16 [phone]

## 2016-09-03 NOTE — Assessment & Plan Note (Addendum)
Weakness: Etiology not completely clear, no headaches, fever, chills or weight loss. Recent B12 wnl. Will do general labs: CMP, CBC, TSH and reassess in ~3  Weeks (already has an appointment) EKG seems at baseline Upper thoracic pain: No recent fall or injury, no respiratory symptoms. Check a x-ray of the T-spine.

## 2016-09-03 NOTE — H&P (Signed)
PULMONARY / CRITICAL CARE MEDICINE   Name: Derrick Fry MRN: [id number] DOB: 1927-09-21    ADMISSION DATE:  09/03/2016  REFERRING MD:  Dr. Alvino Chapel  CHIEF COMPLAINT:  Back pain  HISTORY OF PRESENT ILLNESS:    This is a 83 -year-old male with known past medical history of abdominal aortic aneurysm, presents with 1 week of back pain radiating to her shoulder blades and between the shoulder blades. Patient has been taking pain medicine since last week and the pain subsides under the effect of pain medicine stays. He described it as 9 out of 10 and sharp in nature. Since it did not get better over a week patient came to ER today; patient called Dr. Bunnie Philips few days ago [place] and was asked to go up on the prednisone  He denies fever chills nausea vomiting shortness of breath abdominal pain or urinary or bowel complaints. No pedal edema no loss of sensation no weakness no rash no vision problems  He quit smoking in 1978 before that he smoked 1 pack a day for about 35 years. No illicit drug abuse use. He worked as a Psychologist, sport and exercise.  Family history: One son died of ischemic stroke 11 years ago.  PAST MEDICAL HISTORY :  He  has a past medical history of AAA (abdominal aortic aneurysm) ([place]); Anemia; Arthritis; Basal cell carcinoma of face; CAD (coronary artery disease); Chronic lower back pain; Diabetes mellitus with neuropathy ([place]); DVT (deep venous thrombosis) ([place]) (02/2016); Gait abnormality; Gout; Heart murmur (dx'd 02/2016); Hyperlipidemia; Hypertension; Myocardial infarction ([place]) (1987); OSA (obstructive sleep apnea); Osteoarthritis; Pneumonia (1938); Pulmonary embolism ([place]) (02/2016); Renal insufficiency; and Small bowel obstruction ([place]) (04/01/2012).  PAST SURGICAL HISTORY: He  has a past surgical history that includes Total knee arthroplasty (Left, 05/04/1989); Knuckles replaced (05/2005); Abdominal aortic aneurysm repair (~ 1996); Nephrectomy (Left, 1996); US ECHOCARDIOGRAPHY  (01/14/2007); Cardiovascular stress test (10/13/2009); Joint replacement; Appendectomy; Tonsillectomy; Inguinal hernia repair (Left, 03/07/1983); Cataract extraction w/ intraocular lens  implant, bilateral (09/1999,04/2003); Coronary artery bypass graft (01/04/1986); Cardiac catheterization (2671); Colonoscopy; and Esophagogastroduodenoscopy (egd) with propofol (N/A, 02/22/2016).  Allergies  Allergen Reactions  . Colchicine     Unknown     No current facility-administered medications on file prior to encounter.    Current Outpatient Prescriptions on File Prior to Encounter  Medication Sig  . atorvastatin (LIPITOR) 80 MG tablet Take 1 tablet (80 mg total) by mouth daily.  . cyanocobalamin 100 MCG tablet Take 100 mcg by mouth daily.   . febuxostat (ULORIC) 40 MG tablet Take 40 mg by mouth daily.   . fish oil-omega-3 fatty acids 1000 MG capsule Take 2 g by mouth daily.   . Flaxseed, Linseed, (FLAXSEED OIL) 1000 MG CAPS Take 1,000 mg by mouth 2 (two) times daily.   . folic acid (FOLVITE) 1 MG tablet Take 1 mg by mouth daily.  . furosemide (LASIX) 40 MG tablet Take 1 tablet (40 mg total) by mouth daily.  Marland Kitchen gabapentin (NEURONTIN) 400 MG capsule Take 2 capsules (800 mg total) by mouth 3 (three) times daily.  Marland Kitchen glucosamine-chondroitin 500-400 MG tablet Take 1 tablet by mouth daily.   . methotrexate (RHEUMATREX) 2.5 MG tablet Take 15 mg by mouth once a week. Caution:Chemotherapy. Protect from light. Every Monday  . Multiple Vitamins-Minerals (CENTRUM SILVER) tablet Take 1 tablet by mouth daily.   Marland Kitchen omeprazole (PRILOSEC) 40 MG capsule Take 1 capsule (40 mg total) by mouth daily.  Marland Kitchen oxyCODONE-acetaminophen (PERCOCET/ROXICET) 5-325 MG tablet Take 1  tablet by mouth 4 (four) times daily as needed for severe pain.  . polyethylene glycol (MIRALAX / GLYCOLAX) packet Take 17 g by mouth as needed for mild constipation or moderate constipation.   . predniSONE (DELTASONE) 5 MG tablet Take 2.5 mg by mouth daily.    . Probiotic Product (PROBIOTIC-10 PO) Take 1 tablet by mouth daily.  Marland Kitchen glucose blood (ONE TOUCH ULTRA TEST) test strip Check blood sugar no more than twice daily. (Patient not taking: Reported on 03/15/2016)  . Lancets (ONETOUCH ULTRASOFT) lancets Check blood sugar no more than twice daily. (Patient not taking: Reported on 03/15/2016)  . nitroGLYCERIN (NITROSTAT) 0.4 MG SL tablet Place 1 tablet (0.4 mg total) under the tongue every 5 (five) minutes as needed for chest pain. (Patient not taking: Reported on 03/27/2016)  . rivaroxaban (XARELTO) 20 MG TABS tablet Take 1 tablet (20 mg total) by mouth daily with supper. (Patient not taking: Reported on 09/03/2016)  . sucralfate (CARAFATE) 1 g tablet Take 1 tablet (1 g total) by mouth 4 (four) times daily -  with meals and at bedtime. (Patient not taking: Reported on 09/03/2016)    FAMILY HISTORY:  His indicated that his mother is deceased. He indicated that his father is deceased. He indicated that the status of his sister is unknown. He indicated that only one of his three brothers is alive. He indicated that the status of his neg hx is unknown.    SOCIAL HISTORY: He  reports that he quit smoking about 44 years ago. His smoking use included Cigarettes. He has a 30.00 pack-year smoking history. He has never used smokeless tobacco. He reports that he does not drink alcohol or use drugs.  REVIEW OF SYSTEMS:   13 point review of system was done and is negative except mentioned in history of present illness  VITAL SIGNS: BP (!) 156/65 (BP Location: Right Arm)   Pulse 100   Temp 98.7 F (37.1 C) (Oral)   Resp 18   SpO2 95%    INTAKE / OUTPUT: No intake/output data recorded.  PHYSICAL EXAMINATION: General:  Alert awake oriented 3, no apparent distress Neuro:  Nonfocal cranial nerve II through XII intact HEENT:  Atraumatic normocephalic PERRLA EOMI Cardiovascular:  S3-M1 positive, systolic murmur present Lungs:  Clear to auscultation  bilaterally Abdomen:  Benign Musculoskeletal:  No cyanosis or clubbing or edema Skin:  No rash  LABS:  BMET  Recent Labs Lab 08/29/16 1100 09/03/16 1126  NA 132* 132*  K 4.5 4.1  CL 95* 98*  CO2 29 26  BUN 37* 42*  CREATININE 1.42 1.49*  GLUCOSE 264* 130*    Electrolytes  Recent Labs Lab 08/29/16 1100 09/03/16 1126  CALCIUM 10.0 9.0    CBC  Recent Labs Lab 08/29/16 1100 09/03/16 1126  WBC 11.0* 6.6  HGB 12.6* 10.9*  HCT 37.3* 31.8*  PLT 141.0* 122*    Coag's No results for input(s): APTT, INR in the last 168 hours.  Sepsis Markers No results for input(s): LATICACIDVEN, PROCALCITON, O2SATVEN in the last 168 hours.  ABG No results for input(s): PHART, PCO2ART, PO2ART in the last 168 hours.  Liver Enzymes  Recent Labs Lab 08/29/16 1100 09/03/16 1126  AST 45* 138*  ALT 32 117*  ALKPHOS 69 88  BILITOT 0.8 0.7  ALBUMIN 3.5 2.3*    Cardiac Enzymes No results for input(s): TROPONINI, PROBNP in the last 168 hours.  Glucose No results for input(s): GLUCAP in the last 168 hours.  Imaging Dg  Chest 2 View  Result Date: 09/03/2016 CLINICAL DATA:  Back pain EXAM: CHEST  2 VIEW COMPARISON:  08/29/2016 FINDINGS: Prior CABG. Heart is borderline in size. Mild peribronchial thickening and interstitial prominence. Scarring in the left base. No acute opacities or effusions. No acute bony abnormality. IMPRESSION: Chronic bronchitic changes. Left basilar scarring. No active disease. Electronically Signed   By: Rolm Baptise M.D.   On: 09/03/2016 12:02   Ct Angio Chest Pe W And/or Wo Contrast  Result Date: 09/03/2016 CLINICAL DATA:  Upper back pain. Clinical concern for pulmonary embolus versus dissection. EXAM: CT ANGIOGRAPHY CHEST WITH CONTRAST TECHNIQUE: Multidetector CT imaging of the chest was performed using the standard protocol during bolus administration of intravenous contrast. Multiplanar CT image reconstructions and MIPs were obtained to evaluate the  vascular anatomy. CONTRAST:  80 cc Isovue 370 intravenously. COMPARISON:  None. FINDINGS: Cardiovascular: Satisfactory opacification of the pulmonary arteries to the segmental level. No evidence of pulmonary embolism. Enlarged heart. No pericardial effusion. Heavy calcific atherosclerotic disease of the coronary arteries. Postsurgical changes from CABG. The thoracic aorta is very tortuous, with an ectatic portion of the descending thoracic aorta measuring 3.8 cm in greatest diameter. Heavy calcified and noncalcified plaque is seen along the aorta with asymmetric noncalcified plaque versus blood clot along the posterior wall of the descending aorta. There is a small amount of high density material along the medial wall of the descending aorta, image 73/sequence 10 extending inferiorly to the level of the mid thorax. Low fluid density material is seen along the posterior wall of the aorta in the distal thorax. Mediastinum/Nodes: No enlarged mediastinal, hilar, or axillary lymph nodes. Thyroid gland, trachea, and esophagus demonstrate no significant findings. Lungs/Pleura: Low lung volumes with mild interstitial lung changes and hypoventilatory changes in the lower lobes. Upper Abdomen: No acute abnormality. Musculoskeletal: No chest wall abnormality. No acute or significant osseous findings. Review of the MIP images confirms the above findings. IMPRESSION: Ectatic aorta with eccentric calcified and noncalcified plaque versus intramural blood clot, especially along the posterior wall of the descending aorta. High density material seen along the medial wall of the descending aorta, concerning for acute aortic syndrome, possibly small area of dissection or penetrating ulcer, which is not well visualized on this study, targeting better visualization of the pulmonary arteries. Surgical consultation is recommended. CT angiogram with and without contrast to better visualize the thoracic aorta may be considered if found  clinically feasible. Aortic Atherosclerosis (ICD10-I70.0). No evidence of pulmonary embolus. Enlarged heart with heavily calcified coronary arteries. These results were called by telephone at the time of interpretation on 09/03/2016 at 2:16 pm to Dr. Davonna Belling , who verbally acknowledged these results. Electronically Signed   By: Fidela Salisbury M.D.   On: 09/03/2016 14:22     STUDIES:  CT chest PE protocol negative for pulmonary embolism but showing noncalcified plaque versus intramural blood clot especially along the posterior wall of descending aorta. Concern for acute aortic syndrome possibly small area of dissection or penetrating ulcer.  CULTURES:  ANTIBIOTICS:  SIGNIFICANT EVENTS:  LINES/TUBES:   ASSESSMENT / PLAN:  PULMONARY Saturating 98% on room air   CARDIOVASCULAR A:  Aortic dissection  P:  Dr. Pamalee Leyden was called by ER physician and he reviewed the images. It was considered that patient is not an operative candidate. Plan was for medical management with reversal of anticoagulation,  blood pressure and heart rate control and pain management.  Will stop aspirin. He had stopped his xarelto more  than 2 weeks ago so he does not need Kcentra  Continue esmolol along to titrate heart rate between 16-38 and systolic blood pressure 466-599. Once heart rate is achieved below 60, if systolic blood pressure continues to remain more than 120 then add nitroprusside drip.  Check Echo.  RENAL A:   AKi, ? Due to involvement of renal artery  P:   Will order CT aorta with and without for tomorrow morning as recommended to better visualize the area. Will also order renal usg with dopplers   GASTROINTESTINAL GI PPX  HEMATOLOGIC No anticoagulation due to concern for dissection   ENDOCRINE Insulin sliding scale   NEUROLOGIC No active issues  FAMILY  - Updates: Wife and pastor at bedside. Both updated patient's grave condition  STAFF NOTE: I, Sharia Reeve, MD  have personally reviewed patient's available data, including medical history, events of note, physical examination and test results as part of my evaluation.   The patient is critically ill with multiple organ systems failure and requires high complexity decision making for assessment and support, frequent evaluation and titration of therapies, application of advanced monitoring technologies and extensive interpretation of multiple databases.   Critical Care Time devoted to patient care services described in this note is 45  Minutes. This time reflects time of care of this signee: Sharia Reeve, MD.   Pulmonary and [place] Pager: [phone]  09/03/2016, 4:36 PM

## 2016-09-03 NOTE — ED Triage Notes (Signed)
Patient coming from home via EMS with c/o upper  back pain. Pt state this morning he has problem getting up.pt  Use walker at home. Pt state he can not raise his arm above his shoulder. Pt have hx Afib and CABG.

## 2016-09-04 ENCOUNTER — Telehealth: Payer: Self-pay | Admitting: Internal Medicine

## 2016-09-04 ENCOUNTER — Inpatient Hospital Stay (HOSPITAL_COMMUNITY): Payer: PPO

## 2016-09-04 DIAGNOSIS — R931 Abnormal findings on diagnostic imaging of heart and coronary circulation: Secondary | ICD-10-CM

## 2016-09-04 DIAGNOSIS — N179 Acute kidney failure, unspecified: Secondary | ICD-10-CM

## 2016-09-04 DIAGNOSIS — I7103 Dissection of thoracoabdominal aorta: Secondary | ICD-10-CM

## 2016-09-04 DIAGNOSIS — I701 Atherosclerosis of renal artery: Secondary | ICD-10-CM

## 2016-09-04 DIAGNOSIS — I350 Nonrheumatic aortic (valve) stenosis: Secondary | ICD-10-CM

## 2016-09-04 HISTORY — DX: Abnormal findings on diagnostic imaging of heart and coronary circulation: R93.1

## 2016-09-04 LAB — ECHOCARDIOGRAM COMPLETE
Height: 67 in
WEIGHTICAEL: 2476.21 [oz_av]

## 2016-09-04 LAB — CBC
HEMATOCRIT: 31.5 % — AB (ref 39.0–52.0)
Hemoglobin: 10.9 g/dL — ABNORMAL LOW (ref 13.0–17.0)
MCH: 34.2 pg — ABNORMAL HIGH (ref 26.0–34.0)
MCHC: 34.6 g/dL (ref 30.0–36.0)
MCV: 98.7 fL (ref 78.0–100.0)
PLATELETS: 124 10*3/uL — AB (ref 150–400)
RBC: 3.19 MIL/uL — AB (ref 4.22–5.81)
RDW: 16.1 % — AB (ref 11.5–15.5)
WBC: 7.6 10*3/uL (ref 4.0–10.5)

## 2016-09-04 LAB — GLUCOSE, CAPILLARY
GLUCOSE-CAPILLARY: 96 mg/dL (ref 65–99)
GLUCOSE-CAPILLARY: 119 mg/dL — AB (ref 65–99)
GLUCOSE-CAPILLARY: 228 mg/dL — AB (ref 65–99)
Glucose-Capillary: 121 mg/dL — ABNORMAL HIGH (ref 65–99)
Glucose-Capillary: 211 mg/dL — ABNORMAL HIGH (ref 65–99)
Glucose-Capillary: 214 mg/dL — ABNORMAL HIGH (ref 65–99)

## 2016-09-04 LAB — BASIC METABOLIC PANEL
ANION GAP: 9 (ref 5–15)
BUN: 42 mg/dL — ABNORMAL HIGH (ref 6–20)
CALCIUM: 8.9 mg/dL (ref 8.9–10.3)
CO2: 25 mmol/L (ref 22–32)
Chloride: 100 mmol/L — ABNORMAL LOW (ref 101–111)
Creatinine, Ser: 1.54 mg/dL — ABNORMAL HIGH (ref 0.61–1.24)
GFR, EST AFRICAN AMERICAN: 45 mL/min — AB (ref >60)
GFR, EST NON AFRICAN AMERICAN: 39 mL/min — AB (ref >60)
GLUCOSE: 117 mg/dL — AB (ref 65–99)
POTASSIUM: 4.7 mmol/L (ref 3.5–5.1)
Sodium: 134 mmol/L — ABNORMAL LOW (ref 135–145)

## 2016-09-04 LAB — MAGNESIUM: Magnesium: 2.1 mg/dL (ref 1.7–2.4)

## 2016-09-04 LAB — PHOSPHORUS: PHOSPHORUS: 4.2 mg/dL (ref 2.5–4.6)

## 2016-09-04 MED ORDER — ESMOLOL HCL-SODIUM CHLORIDE 2000 MG/100ML IV SOLN
25.0000 ug/kg/min | Freq: Once | INTRAVENOUS | Status: AC
  Administered 2016-09-04: 50 ug/kg/min via INTRAVENOUS
  Filled 2016-09-04: qty 100

## 2016-09-04 MED ORDER — SODIUM CHLORIDE 0.9 % IV SOLN: 250.0000 mL | INTRAVENOUS | Status: DC | PRN

## 2016-09-04 MED ORDER — LABETALOL HCL 5 MG/ML IV SOLN
10.0000 mg | INTRAVENOUS | Status: DC | PRN
  Administered 2016-09-04 – 2016-09-05 (×2): 20 mg via INTRAVENOUS
  Administered 2016-09-05: 10 mg via INTRAVENOUS
  Administered 2016-09-06: 20 mg via INTRAVENOUS
  Administered 2016-09-07: 10 mg via INTRAVENOUS
  Filled 2016-09-04 (×6): qty 4

## 2016-09-04 MED ORDER — HYDRALAZINE HCL 20 MG/ML IJ SOLN
10.0000 mg | INTRAMUSCULAR | Status: DC | PRN
  Administered 2016-09-04 – 2016-09-06 (×4): 20 mg via INTRAVENOUS
  Administered 2016-09-06: 10 mg via INTRAVENOUS
  Administered 2016-09-06 – 2016-09-08 (×4): 20 mg via INTRAVENOUS
  Filled 2016-09-04 (×3): qty 1
  Filled 2016-09-04: qty 2
  Filled 2016-09-04 (×8): qty 1

## 2016-09-04 MED ORDER — METOPROLOL TARTRATE 12.5 MG HALF TABLET
12.5000 mg | ORAL_TABLET | Freq: Two times a day (BID) | ORAL | Status: DC
  Administered 2016-09-04 (×2): 12.5 mg via ORAL
  Filled 2016-09-04 (×2): qty 1

## 2016-09-04 NOTE — Telephone Encounter (Signed)
LMOM, asked for a call back 

## 2016-09-04 NOTE — Telephone Encounter (Signed)
Pt's spouse called in to make PCP aware that pt is at [facility] in ICU

## 2016-09-04 NOTE — H&P (Signed)
PULMONARY / CRITICAL CARE MEDICINE   Name: Derrick Fry MRN: [id number] DOB: Mar 03, 1928    ADMISSION DATE:  09/03/2016  REFERRING MD:  Dr. Alvino Chapel  CHIEF COMPLAINT:  Back pain  HISTORY OF PRESENT ILLNESS:   83 -year-old male with known past medical history of abdominal aortic aneurysm, presents with 1 week of back pain radiating to her shoulder blades and between the shoulder blades.  PMH of gout on prednisone, LLE DVT & PE - on xarelto  SUBJ -  Denies dyspnea Continues to have some back pain 4/10 - received morphine x 2 overnight  VITAL SIGNS: BP 132/60   Pulse 72   Temp 99 F (37.2 C) (Oral)   Resp 17   Ht 5\' 7"  (1.702 m)   Wt 154 lb 12.2 oz (70.2 kg)   SpO2 100%   BMI 24.24 kg/m    INTAKE / OUTPUT: I/O last 3 completed shifts: In: -  Out: 250 [Urine:250]  PHYSICAL EXAMINATION: General: elderly supine in bed, no apparent distress Neuro:  A/O x 3, non foal HEENT:  Atraumatic normocephalic PERRLA EOMI Cardiovascular:  H6-W7 positive, systolic murmur present Lungs:  Clear to auscultation bilaterally Abdomen:  Benign Musculoskeletal:  No cyanosis or clubbing or edema Skin:  No rash  LABS:  BMET  Recent Labs Lab 08/29/16 1100 09/03/16 1126 09/04/16 0350  NA 132* 132* 134*  K 4.5 4.1 4.7  CL 95* 98* 100*  CO2 29 26 25   BUN 37* 42* 42*  CREATININE 1.42 1.49* 1.54*  GLUCOSE 264* 130* 117*    Electrolytes  Recent Labs Lab 08/29/16 1100 09/03/16 1126 09/04/16 0350  CALCIUM 10.0 9.0 8.9  MG  --   --  2.1  PHOS  --   --  4.2    CBC  Recent Labs Lab 08/29/16 1100 09/03/16 1126 09/04/16 0350  WBC 11.0* 6.6 7.6  HGB 12.6* 10.9* 10.9*  HCT 37.3* 31.8* 31.5*  PLT 141.0* 122* 124*    Coag's No results for input(s): APTT, INR in the last 168 hours.  Sepsis Markers No results for input(s): LATICACIDVEN, PROCALCITON, O2SATVEN in the last 168 hours.  ABG No results for input(s): PHART, PCO2ART, PO2ART in the last 168 hours.  Liver  Enzymes  Recent Labs Lab 08/29/16 1100 09/03/16 1126  AST 45* 138*  ALT 32 117*  ALKPHOS 69 88  BILITOT 0.8 0.7  ALBUMIN 3.5 2.3*    Cardiac Enzymes No results for input(s): TROPONINI, PROBNP in the last 168 hours.  Glucose  Recent Labs Lab 09/03/16 1837 09/03/16 1952 09/03/16 2322 09/04/16 0353 09/04/16 0810  GLUCAP 90 100* 113* 121* 96    Imaging Dg Chest 2 View  Result Date: 09/03/2016 CLINICAL DATA:  Back pain EXAM: CHEST  2 VIEW COMPARISON:  08/29/2016 FINDINGS: Prior CABG. Heart is borderline in size. Mild peribronchial thickening and interstitial prominence. Scarring in the left base. No acute opacities or effusions. No acute bony abnormality. IMPRESSION: Chronic bronchitic changes. Left basilar scarring. No active disease. Electronically Signed   By: Rolm Baptise M.D.   On: 09/03/2016 12:02   Ct Angio Chest Pe W And/or Wo Contrast  Result Date: 09/03/2016 CLINICAL DATA:  Upper back pain. Clinical concern for pulmonary embolus versus dissection. EXAM: CT ANGIOGRAPHY CHEST WITH CONTRAST TECHNIQUE: Multidetector CT imaging of the chest was performed using the standard protocol during bolus administration of intravenous contrast. Multiplanar CT image reconstructions and MIPs were obtained to evaluate the vascular anatomy. CONTRAST:  80 cc Isovue 370  intravenously. COMPARISON:  None. FINDINGS: Cardiovascular: Satisfactory opacification of the pulmonary arteries to the segmental level. No evidence of pulmonary embolism. Enlarged heart. No pericardial effusion. Heavy calcific atherosclerotic disease of the coronary arteries. Postsurgical changes from CABG. The thoracic aorta is very tortuous, with an ectatic portion of the descending thoracic aorta measuring 3.8 cm in greatest diameter. Heavy calcified and noncalcified plaque is seen along the aorta with asymmetric noncalcified plaque versus blood clot along the posterior wall of the descending aorta. There is a small amount of high  density material along the medial wall of the descending aorta, image 73/sequence 10 extending inferiorly to the level of the mid thorax. Low fluid density material is seen along the posterior wall of the aorta in the distal thorax. Mediastinum/Nodes: No enlarged mediastinal, hilar, or axillary lymph nodes. Thyroid gland, trachea, and esophagus demonstrate no significant findings. Lungs/Pleura: Low lung volumes with mild interstitial lung changes and hypoventilatory changes in the lower lobes. Upper Abdomen: No acute abnormality. Musculoskeletal: No chest wall abnormality. No acute or significant osseous findings. Review of the MIP images confirms the above findings. IMPRESSION: Ectatic aorta with eccentric calcified and noncalcified plaque versus intramural blood clot, especially along the posterior wall of the descending aorta. High density material seen along the medial wall of the descending aorta, concerning for acute aortic syndrome, possibly small area of dissection or penetrating ulcer, which is not well visualized on this study, targeting better visualization of the pulmonary arteries. Surgical consultation is recommended. CT angiogram with and without contrast to better visualize the thoracic aorta may be considered if found clinically feasible. Aortic Atherosclerosis (ICD10-I70.0). No evidence of pulmonary embolus. Enlarged heart with heavily calcified coronary arteries. These results were called by telephone at the time of interpretation on 09/03/2016 at 2:16 pm to Dr. Davonna Belling , who verbally acknowledged these results. Electronically Signed   By: Fidela Salisbury M.D.   On: 09/03/2016 14:22   Dg Chest Port 1 View  Result Date: 09/04/2016 CLINICAL DATA:  Aortic dissection. EXAM: PORTABLE CHEST 1 VIEW COMPARISON:  CT 09/03/2016.  Chest x-ray 09/03/2016. FINDINGS: Prior CABG. Heart size stable. Tortuous thoracic aorta again noted, reference made to prior CT report 09/03/2016. Heart size stable.  Low lung volumes with basilar atelectasis. No pleural effusion or pneumothorax . IMPRESSION: 1. Prior CABG. Heart size stable. Tortuous thoracic aorta again noted, reference made to prior CT report 09/03/2016. 2.  Bibasilar subsegmental atelectasis. Electronically Signed   By: Marcello Moores  Register   On: 09/04/2016 06:48     STUDIES:  CT chest PE protocol negative for pulmonary embolism but showing noncalcified plaque versus intramural blood clot especially along the posterior wall of descending aorta.   CULTURES:  ANTIBIOTICS:  SIGNIFICANT EVENTS:  LINES/TUBES:   ASSESSMENT / PLAN:  Concern for acute aortic dissection  possibly small area of dissection or penetrating ulcer. TCTS consulted >>  not an operative candidate He had stopped his xarelto more than 2 weeks ago  CARDIOVASCULAR A:  Aortic dissection  P:  Aggressive BP control , aim for SBP <140 Use labetolol prn, start metoprolol 12.5 bid & dc esmolol gtt If does not come off nitroprusside drip, use hydrallazine prn & consider cardene instead Await  Echo. Use morphine prn for pain, resume home oxycodone  RENAL A:   AKi, ? Due to involvement of renal artery  P:   Defer CT aortogram x 24h to avoid more contrast injury -plan for 7/3 once renal function improves Hydrate with NS  LLE DVT/ PE 01/2016 No anticoagulation   Gout- resume home dose of prednisone    FAMILY  - Updates: Wife and pastor at bedside.   My cc time x 77m  Kara Mead MD. FCCP. [facility] Pager [phone] If no response call [phone]     09/04/2016, 9:34 AM

## 2016-09-04 NOTE — Telephone Encounter (Signed)
FYI

## 2016-09-04 NOTE — Progress Notes (Signed)
eLink Physician-Brief Progress Note Patient Name: TIMMIE DUGUE DOB: Jun 24, 1927 MRN: [id number]   Date of Service  09/04/2016  HPI/Events of Note  Esmolol order expired  eICU Interventions  Re-ordered     Intervention Category Minor Interventions: Routine modifications to care plan (e.g. PRN medications for pain, fever)  Simonne Maffucci 09/04/2016, 4:27 AM

## 2016-09-04 NOTE — Progress Notes (Signed)
  Echocardiogram 2D Echocardiogram has been performed.  Derrick Fry 09/04/2016, 9:19 AM

## 2016-09-04 NOTE — Progress Notes (Signed)
**  Preliminary report by tech**  Renal artery duplex complete. No evidence of renal artery stenosis noted regarding the right kidney.  The left kidney is surgically absent. The patient had a left nephrectomy in 1996. Abnormal intrarenal resistive index on right.   09/04/16 10:51 AM Derrick Fry RVT

## 2016-09-05 DIAGNOSIS — I169 Hypertensive crisis, unspecified: Secondary | ICD-10-CM

## 2016-09-05 DIAGNOSIS — I7100 Dissection of unspecified site of aorta: Principal | ICD-10-CM

## 2016-09-05 DIAGNOSIS — R9430 Abnormal result of cardiovascular function study, unspecified: Secondary | ICD-10-CM

## 2016-09-05 LAB — MRSA PCR SCREENING: MRSA BY PCR: NEGATIVE

## 2016-09-05 LAB — BASIC METABOLIC PANEL
Anion gap: 9 (ref 5–15)
BUN: 55 mg/dL — AB (ref 6–20)
CALCIUM: 8.8 mg/dL — AB (ref 8.9–10.3)
CO2: 24 mmol/L (ref 22–32)
Chloride: 97 mmol/L — ABNORMAL LOW (ref 101–111)
Creatinine, Ser: 1.78 mg/dL — ABNORMAL HIGH (ref 0.61–1.24)
GFR calc Af Amer: 38 mL/min — ABNORMAL LOW (ref >60)
GFR, EST NON AFRICAN AMERICAN: 33 mL/min — AB (ref >60)
GLUCOSE: 173 mg/dL — AB (ref 65–99)
POTASSIUM: 4.3 mmol/L (ref 3.5–5.1)
Sodium: 130 mmol/L — ABNORMAL LOW (ref 135–145)

## 2016-09-05 LAB — CBC
HCT: 29.2 % — ABNORMAL LOW (ref 39.0–52.0)
Hemoglobin: 10 g/dL — ABNORMAL LOW (ref 13.0–17.0)
MCH: 33.4 pg (ref 26.0–34.0)
MCHC: 34.2 g/dL (ref 30.0–36.0)
MCV: 97.7 fL (ref 78.0–100.0)
Platelets: 136 10*3/uL — ABNORMAL LOW (ref 150–400)
RBC: 2.99 MIL/uL — ABNORMAL LOW (ref 4.22–5.81)
RDW: 16.2 % — AB (ref 11.5–15.5)
WBC: 7.4 10*3/uL (ref 4.0–10.5)

## 2016-09-05 LAB — GLUCOSE, CAPILLARY
GLUCOSE-CAPILLARY: 150 mg/dL — AB (ref 65–99)
GLUCOSE-CAPILLARY: 271 mg/dL — AB (ref 65–99)
GLUCOSE-CAPILLARY: 227 mg/dL — AB (ref 65–99)
Glucose-Capillary: 173 mg/dL — ABNORMAL HIGH (ref 65–99)
Glucose-Capillary: 232 mg/dL — ABNORMAL HIGH (ref 65–99)
Glucose-Capillary: 259 mg/dL — ABNORMAL HIGH (ref 65–99)

## 2016-09-05 LAB — PHOSPHORUS: Phosphorus: 3.9 mg/dL (ref 2.5–4.6)

## 2016-09-05 LAB — MAGNESIUM: Magnesium: 2.3 mg/dL (ref 1.7–2.4)

## 2016-09-05 MED ORDER — METOPROLOL TARTRATE 25 MG PO TABS
25.0000 mg | ORAL_TABLET | Freq: Two times a day (BID) | ORAL | Status: DC
  Administered 2016-09-05 – 2016-09-07 (×6): 25 mg via ORAL
  Filled 2016-09-05 (×7): qty 1

## 2016-09-05 MED ORDER — INSULIN ASPART 100 UNIT/ML ~~LOC~~ SOLN
8.0000 [IU] | Freq: Once | SUBCUTANEOUS | Status: AC
  Administered 2016-09-05: 8 [IU] via SUBCUTANEOUS

## 2016-09-05 MED ORDER — INSULIN ASPART 100 UNIT/ML ~~LOC~~ SOLN
0.0000 [IU] | Freq: Three times a day (TID) | SUBCUTANEOUS | Status: DC
  Administered 2016-09-06: 5 [IU] via SUBCUTANEOUS
  Administered 2016-09-06 (×2): 3 [IU] via SUBCUTANEOUS
  Administered 2016-09-07: 5 [IU] via SUBCUTANEOUS
  Administered 2016-09-07: 3 [IU] via SUBCUTANEOUS
  Administered 2016-09-07: 5 [IU] via SUBCUTANEOUS
  Administered 2016-09-08: 3 [IU] via SUBCUTANEOUS
  Administered 2016-09-08: 8 [IU] via SUBCUTANEOUS
  Administered 2016-09-09: 3 [IU] via SUBCUTANEOUS
  Administered 2016-09-09: 5 [IU] via SUBCUTANEOUS
  Administered 2016-09-10 – 2016-09-11 (×3): 2 [IU] via SUBCUTANEOUS

## 2016-09-05 MED ORDER — INSULIN ASPART 100 UNIT/ML ~~LOC~~ SOLN
0.0000 [IU] | Freq: Every day | SUBCUTANEOUS | Status: DC
  Administered 2016-09-05: 2 [IU] via SUBCUTANEOUS
  Administered 2016-09-07: 4 [IU] via SUBCUTANEOUS

## 2016-09-05 MED ORDER — FAMOTIDINE 20 MG PO TABS
20.0000 mg | ORAL_TABLET | Freq: Every day | ORAL | Status: DC
  Administered 2016-09-05 – 2016-09-11 (×7): 20 mg via ORAL
  Filled 2016-09-05 (×7): qty 1

## 2016-09-05 MED ORDER — HYDRALAZINE HCL 10 MG PO TABS
10.0000 mg | ORAL_TABLET | Freq: Three times a day (TID) | ORAL | Status: DC
  Administered 2016-09-05 – 2016-09-06 (×3): 10 mg via ORAL
  Filled 2016-09-05 (×3): qty 1

## 2016-09-05 NOTE — Progress Notes (Signed)
eLink Physician-Brief Progress Note Patient Name: Derrick Fry DOB: 1927-10-17 MRN: [id number]   Date of Service  09/05/2016  HPI/Events of Note  Blood glucose = 271. Patient on Q 4 hour standard Novolog insulin coverage.   eICU Interventions  Will order: 1. D/C Q 4 hour standard Novolog SSI.  2. AC/HS moderate Novolog SSI per recommendation of diabetic coordinator.      Intervention Category Major Interventions: Hyperglycemia - active titration of insulin therapy  Sommer,Steven Eugene 09/05/2016, 8:15 PM

## 2016-09-05 NOTE — Progress Notes (Signed)
PHARMACIST - PHYSICIAN COMMUNICATION  DR:  Elsworth Soho  CONCERNING: IV to Oral Route Change Policy  RECOMMENDATION: This patient is receiving famotdine by the intravenous route.  Based on criteria approved by the Pharmacy and Therapeutics Committee, the intravenous medication(s) is/are being converted to the equivalent oral dose form(s).   DESCRIPTION: These criteria include:  The patient is eating (either orally or via tube) and/or has been taking other orally administered medications for a least 24 hours  The patient has no evidence of active gastrointestinal bleeding or impaired GI absorption (gastrectomy, short bowel, patient on TNA or NPO).  Renally adjust famotidine to 20mg  daily  If you have questions about this conversion, please contact the Pharmacy Department  []   [phone] )  [facility] []   [phone] )  [facility] []   [phone] )  [facility] []   [phone] )  [facility] [x]   [phone] )  Wise, [place], [facility] 09/05/2016 10:14 AM

## 2016-09-05 NOTE — Progress Notes (Signed)
83 -year-old male with known past medical history of abdominal aortic aneurysm, presents with 1 week of back pain radiating to her shoulder blades and between the shoulder blades.  PMH of gout on prednisone, LLE DVT & PE - on xarelto   Left scapular pain persists No dyspnea or chest pain Off drips   Vitals:   09/05/16 0652 09/05/16 0700  BP: 123/60 126/74  Pulse: 81 92  Resp: 13 16  Temp:      Gen. Pleasant, well-nourished,elderly, in no distress, normal affect ENT - no lesions, no post nasal drip Neck: No JVD, no thyromegaly, no carotid bruits Lungs: no use of accessory muscles, no dullness to percussion, clear without rales or rhonchi  Cardiovascular: Rhythm regular, heart sounds  normal, no murmurs or gallops, no peripheral edema Abdomen: soft and non-tender, no hepatosplenomegaly, BS normal. Musculoskeletal: No deformities, no cyanosis or clubbing Neuro:  alert, non focal   Labs - cr rising, Na drop to 130   A/P   Concern for acute aortic dissection  possibly small area of dissection or penetrating ulcer. TCTS consulted >>  not an operative candidate He had stopped his xarelto more than 2 weeks ago  CARDIOVASCULAR A:  Aortic dissection  Cardiomyopathy - EF 35% P:  Aggressive BP control , aim for SBP <140 Use labetolol prn, Increase metoprolol 25 bid  Dc  nitroprusside drip, use hydrallazine prn  Use morphine prn for pain, resume home oxycodone Cards consult for LV dysfunction  RENAL A:   AKI , p-contrast, s/p L nephrectomy 1996 , no evidence of renal a stenosis on duplex  P:   Defer CT aortogram  to avoid more contrast injury -can do once renal function improves Hydrate with NS 50/h   LLE DVT/ PE 01/2016 No anticoagulation   Gout- ct home dose of prednisone  Change to SDU status, cam transfer to tele in 24h & to [facility] 7/4  Kara Mead MD. [facility].  Pulmonary & Critical care Pager [phone] If no response call [phone]   09/05/2016
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
## 2016-09-05 NOTE — Telephone Encounter (Signed)
Called, no answer.

## 2016-09-05 NOTE — Progress Notes (Signed)
Inpatient Diabetes Program Recommendations  AACE/ADA: New Consensus Statement on Inpatient Glycemic Control (2015)  Target Ranges:  Prepandial:   less than 140 mg/dL      Peak postprandial:   less than 180 mg/dL (1-2 hours)      Critically ill patients:  140 - 180 mg/dL   Results for QUINNTIN, MALTER (MRN [id number]) as of 09/05/2016 09:02  Ref. Range 09/04/2016 23:13 09/05/2016 03:24 09/05/2016 08:10  Glucose-Capillary Latest Ref Range: 65 - 99 mg/dL 228 (H) 173 (H) 150 (H)    Admit with: Aortic Dissection  History: DM  Home DM Meds: None (diet controlled)  Current Insulin Orders: Novolog 2-4-6 Q4 hours per ICU Glycemic Control Protocol      MD- Note patient allowed solid PO diet.  Does not meet the criteria for the ICU Glycemic Control Protocol since he is allowed to eat.  Please consider the following:  1. D/c the Glycemic Control Protocol and Stop the current Novolog SSI regimen Q4 hours  2. Start Novolog Moderate Correction Scale/ SSI (0-15 units) TID AC + HS per the Glycemic Control Order set     --Will follow patient during hospitalization--  Wyn Quaker RN, MSN, CDE Diabetes Coordinator Inpatient Glycemic Control Team Team Pager: [phone] (8a-5p)

## 2016-09-06 ENCOUNTER — Inpatient Hospital Stay (HOSPITAL_COMMUNITY): Payer: PPO

## 2016-09-06 DIAGNOSIS — Z951 Presence of aortocoronary bypass graft: Secondary | ICD-10-CM

## 2016-09-06 LAB — CBC
HCT: 30.6 % — ABNORMAL LOW (ref 39.0–52.0)
Hemoglobin: 10.6 g/dL — ABNORMAL LOW (ref 13.0–17.0)
MCH: 34.1 pg — AB (ref 26.0–34.0)
MCHC: 34.6 g/dL (ref 30.0–36.0)
MCV: 98.4 fL (ref 78.0–100.0)
PLATELETS: 172 10*3/uL (ref 150–400)
RBC: 3.11 MIL/uL — AB (ref 4.22–5.81)
RDW: 16.4 % — ABNORMAL HIGH (ref 11.5–15.5)
WBC: 9 10*3/uL (ref 4.0–10.5)

## 2016-09-06 LAB — BASIC METABOLIC PANEL
Anion gap: 9 (ref 5–15)
BUN: 61 mg/dL — AB (ref 6–20)
CO2: 25 mmol/L (ref 22–32)
CREATININE: 1.81 mg/dL — AB (ref 0.61–1.24)
Calcium: 9.3 mg/dL (ref 8.9–10.3)
Chloride: 95 mmol/L — ABNORMAL LOW (ref 101–111)
GFR calc Af Amer: 37 mL/min — ABNORMAL LOW (ref >60)
GFR, EST NON AFRICAN AMERICAN: 32 mL/min — AB (ref >60)
GLUCOSE: 189 mg/dL — AB (ref 65–99)
POTASSIUM: 5.2 mmol/L — AB (ref 3.5–5.1)
Sodium: 129 mmol/L — ABNORMAL LOW (ref 135–145)

## 2016-09-06 LAB — GLUCOSE, CAPILLARY
GLUCOSE-CAPILLARY: 219 mg/dL — AB (ref 65–99)
GLUCOSE-CAPILLARY: 195 mg/dL — AB (ref 65–99)
Glucose-Capillary: 198 mg/dL — ABNORMAL HIGH (ref 65–99)
Glucose-Capillary: 184 mg/dL — ABNORMAL HIGH (ref 65–99)

## 2016-09-06 MED ORDER — HYDRALAZINE HCL 25 MG PO TABS: 25.0000 mg | ORAL_TABLET | Freq: Three times a day (TID) | ORAL | Status: DC

## 2016-09-06 MED ORDER — LACTULOSE 10 GM/15ML PO SOLN
20.0000 g | Freq: Two times a day (BID) | ORAL | Status: AC
  Administered 2016-09-06: 20 g via ORAL
  Filled 2016-09-06: qty 30

## 2016-09-06 MED ORDER — INSULIN GLARGINE 100 UNIT/ML ~~LOC~~ SOLN
6.0000 [IU] | Freq: Every day | SUBCUTANEOUS | Status: DC
  Administered 2016-09-06 – 2016-09-08 (×3): 6 [IU] via SUBCUTANEOUS
  Filled 2016-09-06 (×3): qty 0.06

## 2016-09-06 MED ORDER — HYDRALAZINE HCL 25 MG PO TABS
25.0000 mg | ORAL_TABLET | Freq: Three times a day (TID) | ORAL | Status: DC
  Administered 2016-09-06 – 2016-09-07 (×2): 25 mg via ORAL
  Filled 2016-09-06 (×2): qty 1

## 2016-09-06 MED ORDER — PREDNISONE 20 MG PO TABS
20.0000 mg | ORAL_TABLET | Freq: Once | ORAL | Status: AC
  Administered 2016-09-06: 20 mg via ORAL
  Filled 2016-09-06: qty 1

## 2016-09-06 MED ORDER — HYDRALAZINE HCL 10 MG PO TABS
20.0000 mg | ORAL_TABLET | Freq: Three times a day (TID) | ORAL | Status: DC
  Administered 2016-09-06: 20 mg via ORAL
  Filled 2016-09-06: qty 2

## 2016-09-06 MED ORDER — SODIUM CHLORIDE 0.9 % IV SOLN
INTRAVENOUS | Status: DC
  Administered 2016-09-06: 09:00:00 via INTRAVENOUS

## 2016-09-06 NOTE — Progress Notes (Signed)
PROGRESS NOTE    Derrick Fry  OXB:[id number] DOB: 28-Apr-1927 DOA: 09/03/2016 PCP: Colon Branch, MD   Brief Narrative: 83 -year-old male with known past medical history of abdominal aortic aneurysm, presents with 1 week of back pain radiating to her shoulder blades and between the shoulder blades. PMH of gout on prednisone, LLE DVT & PE - on xarelto. CT angio: High density material seen along the medial wall of the descending aorta, concerning for acute aortic syndrome, possibly small area of dissection or penetrating ulcer, which is not well visualized on this study, targeting better visualization of the pulmonary arteries. CVTS was consulted and patient was deen not candidate for surgery.    Assessment & Plan:   Active Problems:   Aortic dissection (HCC)  1-Aortic dissection;  Plan for SBP less than 140.  Continue with metoprolol.  Hydralazine PRN. Would avoid further contrast for now due to worsening renal function. Could do ct when renal function improves.  Will increase hydralazine to 20 mg TID.   Cardiomyopathy;  EF 35 % by ECHO.  Cardiology consulted and following.  No ACE due to AKI.   DM; will start low dose lantus. Continue with SSI.   AKI;  p-contrast, s/p L nephrectomy 1996 , no evidence of renal a stenosis on duplex Start IV fluids. Cr increase to 1.8.  Change diet to renal diet.   Hyperkalemia; will order laxative. Change diet to renal diet.    Gout- on chronic prednisone.  Right knee pain, worsening pain. Will give one time dose of prednisone.   LLE DVT/PE 01-2016. Off anticoagulation.     DVT prophylaxis: SCD Code Status: DNR Family Communication: Wife at bedside.  Disposition Plan: remain in the step down unit.   Consultants:   Cardiology  CVTS  CCM admitted patient.    Procedures: \  ECHO Ef 35 %  Renal US>    Antimicrobials:   none   Subjective: He report some improvement of back pain.  Complaining of right knee pain, he can  not put weight on it.    Objective: Vitals:   09/06/16 0400 09/06/16 0500 09/06/16 0600 09/06/16 0617  BP: (!) 120/47 (!) 124/46 (!) 121/47 (!) 121/47  Pulse: 79 68 74   Resp: 12 14 (!) 21   Temp:      TempSrc:      SpO2: 96% 98% 97%   Weight:      Height:        Intake/Output Summary (Last 24 hours) at 09/06/16 0716 Last data filed at 09/05/16 2200  Gross per 24 hour  Intake              480 ml  Output              300 ml  Net              180 ml   Filed Weights   09/04/16 1909 09/05/16 0332 09/06/16 0300  Weight: 70.2 kg (154 lb 12.2 oz) 70.2 kg (154 lb 12.2 oz) 73.4 kg (161 lb 13.1 oz)    Examination:  General exam: Appears calm and comfortable  Respiratory system: Clear to auscultation. Respiratory effort normal. Cardiovascular system: S1 & S2 heard, RRR. No JVD, murmurs, rubs, gallops or clicks. No pedal edema. Gastrointestinal system: Abdomen is nondistended, soft and nontender. No organomegaly or masses felt. Normal bowel sounds heard. Central nervous system: Alert and oriented. No focal neurological deficits. Extremities: Symmetric 5 x 5 power. Right knee  with swelling edema. No redness.  Skin: No rashes, lesions or ulcers Psychiatry: Judgement and insight appear normal. Mood & affect appropriate.     Data Reviewed: I have personally reviewed following labs and imaging studies  CBC:  Recent Labs Lab 09/03/16 1126 09/04/16 0350 09/05/16 0319 09/06/16 0255  WBC 6.6 7.6 7.4 9.0  NEUTROABS 5.7  --   --   --   HGB 10.9* 10.9* 10.0* 10.6*  HCT 31.8* 31.5* 29.2* 30.6*  MCV 100.0 98.7 97.7 98.4  PLT 122* 124* 136* 016   Basic Metabolic Panel:  Recent Labs Lab 09/03/16 1126 09/04/16 0350 09/05/16 0319 09/06/16 0255  NA 132* 134* 130* 129*  K 4.1 4.7 4.3 5.2*  CL 98* 100* 97* 95*  CO2 26 25 24 25   GLUCOSE 130* 117* 173* 189*  BUN 42* 42* 55* 61*  CREATININE 1.49* 1.54* 1.78* 1.81*  CALCIUM 9.0 8.9 8.8* 9.3  MG  --  2.1 2.3  --   PHOS  --  4.2  3.9  --    GFR: Estimated Creatinine Clearance: 26.9 mL/min (A) (by C-G formula based on SCr of 1.81 mg/dL (H)). Liver Function Tests:  Recent Labs Lab 09/03/16 1126  AST 138*  ALT 117*  ALKPHOS 88  BILITOT 0.7  PROT 6.1*  ALBUMIN 2.3*   No results for input(s): LIPASE, AMYLASE in the last 168 hours. No results for input(s): AMMONIA in the last 168 hours. Coagulation Profile: No results for input(s): INR, PROTIME in the last 168 hours. Cardiac Enzymes: No results for input(s): CKTOTAL, CKMB, CKMBINDEX, TROPONINI in the last 168 hours. BNP (last 3 results) No results for input(s): PROBNP in the last 8760 hours. HbA1C: No results for input(s): HGBA1C in the last 72 hours. CBG:  Recent Labs Lab 09/05/16 0810 09/05/16 1235 09/05/16 1646 09/05/16 1916 09/05/16 2258  GLUCAP 150* 232* 259* 271* 227*   Lipid Profile: No results for input(s): CHOL, HDL, LDLCALC, TRIG, CHOLHDL, LDLDIRECT in the last 72 hours. Thyroid Function Tests: No results for input(s): TSH, T4TOTAL, FREET4, T3FREE, THYROIDAB in the last 72 hours. Anemia Panel: No results for input(s): VITAMINB12, FOLATE, FERRITIN, TIBC, IRON, RETICCTPCT in the last 72 hours. Sepsis Labs: No results for input(s): PROCALCITON, LATICACIDVEN in the last 168 hours.  Recent Results (from the past 240 hour(s))  MRSA PCR Screening     Status: None   Collection Time: 09/05/16 10:12 AM  Result Value Ref Range Status   MRSA by PCR NEGATIVE NEGATIVE Final    Comment:        The GeneXpert MRSA Assay (FDA approved for NASAL specimens only), is one component of a comprehensive MRSA colonization surveillance program. It is not intended to diagnose MRSA infection nor to guide or monitor treatment for MRSA infections.          Radiology Studies: No results found.      Scheduled Meds: . famotidine  20 mg Oral Daily  . gabapentin  200 mg Oral TID  . hydrALAZINE  10 mg Oral Q8H  . insulin aspart  0-15 Units  Subcutaneous TID WC  . insulin aspart  0-5 Units Subcutaneous QHS  . metoprolol tartrate  25 mg Oral BID  . predniSONE  2.5 mg Oral QAC breakfast   Continuous Infusions: . sodium chloride       LOS: 3 days    Time spent: 35 minutes.     Elmarie Shiley, MD [facility] Pager [phone]  If 7PM-7AM, please contact night-coverage [email or web address]  Password [email or web address] 09/06/2016, 7:16 AM

## 2016-09-06 NOTE — Progress Notes (Signed)
Progress Note  Patient Name: Derrick Fry Date of Encounter: 09/06/2016  Primary Cardiologist: Nahser  Subjective   Less back pain  Inpatient Medications    Scheduled Meds: . famotidine  20 mg Oral Daily  . gabapentin  200 mg Oral TID  . hydrALAZINE  10 mg Oral Q8H  . insulin aspart  0-15 Units Subcutaneous TID WC  . insulin aspart  0-5 Units Subcutaneous QHS  . metoprolol tartrate  25 mg Oral BID  . predniSONE  2.5 mg Oral QAC breakfast   Continuous Infusions: . sodium chloride     PRN Meds: sodium chloride, acetaminophen, docusate, hydrALAZINE, labetalol, morphine injection, ondansetron (ZOFRAN) IV, oxyCODONE-acetaminophen   Vital Signs    Vitals:   09/06/16 0400 09/06/16 0500 09/06/16 0600 09/06/16 0617  BP: (!) 120/47 (!) 124/46 (!) 121/47 (!) 121/47  Pulse: 79 68 74   Resp: 12 14 (!) 21   Temp:      TempSrc:      SpO2: 96% 98% 97%   Weight:      Height:        Intake/Output Summary (Last 24 hours) at 09/06/16 0713 Last data filed at 09/05/16 2200  Gross per 24 hour  Intake              480 ml  Output              300 ml  Net              180 ml   Filed Weights   09/04/16 1909 09/05/16 0332 09/06/16 0300  Weight: 154 lb 12.2 oz (70.2 kg) 154 lb 12.2 oz (70.2 kg) 161 lb 13.1 oz (73.4 kg)    Telemetry    NSR PVC;s PAC;s  - Personally Reviewed  ECG    NSR no acute ST changes  - Personally Reviewed  Physical Exam   GEN: No acute distress.   Neck: No JVD Cardiac: RRR, no murmurs, rubs, or gallops.  Respiratory: Clear to auscultation bilaterally. GI: Soft, nontender, non-distended  MS: No edema; No deformity. Neuro:  Nonfocal  Psych: Normal affect   Labs    Chemistry Recent Labs Lab 09/03/16 1126 09/04/16 0350 09/05/16 0319 09/06/16 0255  NA 132* 134* 130* 129*  K 4.1 4.7 4.3 5.2*  CL 98* 100* 97* 95*  CO2 26 25 24 25   GLUCOSE 130* 117* 173* 189*  BUN 42* 42* 55* 61*  CREATININE 1.49* 1.54* 1.78* 1.81*  CALCIUM 9.0 8.9 8.8* 9.3    PROT 6.1*  --   --   --   ALBUMIN 2.3*  --   --   --   AST 138*  --   --   --   ALT 117*  --   --   --   ALKPHOS 88  --   --   --   BILITOT 0.7  --   --   --   GFRNONAA 40* 39* 33* 32*  GFRAA 47* 45* 38* 37*  ANIONGAP 8 9 9 9      Hematology Recent Labs Lab 09/04/16 0350 09/05/16 0319 09/06/16 0255  WBC 7.6 7.4 9.0  RBC 3.19* 2.99* 3.11*  HGB 10.9* 10.0* 10.6*  HCT 31.5* 29.2* 30.6*  MCV 98.7 97.7 98.4  MCH 34.2* 33.4 34.1*  MCHC 34.6 34.2 34.6  RDW 16.1* 16.2* 16.4*  PLT 124* 136* 172    Cardiac EnzymesNo results for input(s): TROPONINI in the last 168 hours.  Recent Labs Lab  09/03/16 1135  TROPIPOC 0.12*     BNPNo results for input(s): BNP, PROBNP in the last 168 hours.   DDimer  Recent Labs Lab 09/03/16 1126  DDIMER >20.00*     Radiology    No results found.  Cardiac Studies   Echo :  09/04/16  EF 35-40% mild AR mild MR  Patient Profile     83 y.o. male admitted with back pain. ? Descending thoracic dissection vs penetrating ulcer. Distant history of CABG 1997. Solitary kidney with baseline Cr around 1.6  Assessment & Plan    1) Decreased EF:  No chest pain no acute coronary syndrome On beta blocker and hydralazine will start nitrates before hospital d/c No signs of CHF 2) CAD/CABG:  Distant no chest pain stable 3) PVCS: chronic asymptomatic 4) Aortic Disease.  Cr 1.8 today primary service hesitant to repeat CT but I think he would be fine Alternative would be MRI/Aortic MRA He is not claustrophobic   Signed, Jenkins Rouge, MD  09/06/2016, 7:13 AM

## 2016-09-06 NOTE — Evaluation (Signed)
Physical Therapy Evaluation Patient Details Name: Derrick Fry MRN: [id number] DOB: 01-07-1928 Today's Date: 09/06/2016   History of Present Illness  Pt admitted through ED with c/o upper back pain and hx of Psoriatic Arthritis, AAA, CAD, DM with neuropathy, MI, CABG, L TKR, L nephrectomy, and L hand knuckle replacement  Clinical Impression  Pt admitted as above and presenting with functional mobility limitations 2* generalized weakness, upper back pain and severe R knee pain with WB 2* gout.  Dependent on pain control, pt should progress to dc home with family assist.    Follow Up Recommendations Home health PT    Equipment Recommendations  None recommended by PT    Recommendations for Other Services OT consult     Precautions / Restrictions Precautions Precautions: Fall;Other (comment) (R knee pain 2* gout limiting WB tolerance) Restrictions Weight Bearing Restrictions: No      Mobility  Bed Mobility Overal bed mobility: Needs Assistance Bed Mobility: Supine to Sit     Supine to sit: Min guard;HOB elevated     General bed mobility comments: Increased time with HOB elevated  Transfers Overall transfer level: Needs assistance Equipment used: Rolling walker (2 wheeled) Transfers: Sit to/from Stand Sit to Stand: Min assist;+2 physical assistance;+2 safety/equipment;From elevated surface         General transfer comment: cues for LE management and use of UEs to self assist  Ambulation/Gait Ambulation/Gait assistance: Mod assist;+2 physical assistance;+2 safety/equipment Ambulation Distance (Feet): 2 Feet Assistive device: Rolling walker (2 wheeled) Gait Pattern/deviations: Step-to pattern;Decreased step length - right;Decreased step length - left;Shuffle;Trunk flexed Gait velocity: decr Gait velocity interpretation: Below normal speed for age/gender General Gait Details: cues for sequence, posture, position from RW.  Pt tolerating min WB on R LE 2* knee pain and  demonstrating difficulty WB sufficiently on UEs to compensate  Stairs            Wheelchair Mobility    Modified Rankin (Stroke Patients Only)       Balance Overall balance assessment: Needs assistance Sitting-balance support: Feet supported;No upper extremity supported Sitting balance-Leahy Scale: Good     Standing balance support: Bilateral upper extremity supported Standing balance-Leahy Scale: Poor                               Pertinent Vitals/Pain Pain Assessment: 0-10 Pain Score: 10-Worst pain ever Pain Location: 10/10 R knee pain with WB; 0/10 knee pain at rest.  7/10 upper back pain Pain Descriptors / Indicators: Aching;Grimacing;Sharp Pain Intervention(s): Limited activity within patient's tolerance;Monitored during session;Premedicated before session    Home Living Family/patient expects to be discharged to:: Private residence Living Arrangements: Spouse/significant other Available Help at Discharge: Family Type of Home: House Home Access: Stairs to enter Entrance Stairs-Rails: None Entrance Stairs-Number of Steps: 2 Home Layout: One level Home Equipment: Cane - single point;Walker - 2 wheels      Prior Function Level of Independence: Independent with assistive device(s)         Comments: uses cane outside, drives, likes to mow with riding mower     Hand Dominance   Dominant Hand: Right    Extremity/Trunk Assessment   Upper Extremity Assessment Upper Extremity Assessment: Generalized weakness    Lower Extremity Assessment Lower Extremity Assessment: Generalized weakness    Cervical / Trunk Assessment Cervical / Trunk Assessment: Normal  Communication   Communication: No difficulties  Cognition Arousal/Alertness: Awake/alert Behavior During Therapy: WFL for tasks  assessed/performed Overall Cognitive Status: Within Functional Limits for tasks assessed                                        General  Comments      Exercises     Assessment/Plan    PT Assessment Patient needs continued PT services  PT Problem List Decreased strength;Decreased activity tolerance;Decreased balance;Decreased mobility;Decreased knowledge of use of DME;Pain       PT Treatment Interventions DME instruction;Gait training;Stair training;Functional mobility training;Therapeutic activities;Therapeutic exercise;Patient/family education    PT Goals (Current goals can be found in the Care Plan section)  Acute Rehab PT Goals Patient Stated Goal: Regain IND PT Goal Formulation: With patient Time For Goal Achievement: 09/20/16 Potential to Achieve Goals: Good    Frequency Min 3X/week   Barriers to discharge        Co-evaluation               AM-PAC PT "6 Clicks" Daily Activity  Outcome Measure Difficulty turning over in bed (including adjusting bedclothes, sheets and blankets)?: A Lot Difficulty moving from lying on back to sitting on the side of the bed? : A Lot Difficulty sitting down on and standing up from a chair with arms (e.g., wheelchair, bedside commode, etc,.)?: Total Help needed moving to and from a bed to chair (including a wheelchair)?: A Lot Help needed walking in hospital room?: A Lot Help needed climbing 3-5 steps with a railing? : Total 6 Click Score: 10    End of Session Equipment Utilized During Treatment: Gait belt Activity Tolerance: Patient limited by pain Patient left: in chair;with call bell/phone within reach;with family/visitor present Nurse Communication: Mobility status PT Visit Diagnosis: Muscle weakness (generalized) (M62.81);Difficulty in walking, not elsewhere classified (R26.2);Pain Pain - Right/Left: Right Pain - part of body: Knee    Time: 3893-7342 PT Time Calculation (min) (ACUTE ONLY): 22 min   Charges:   PT Evaluation $PT Eval Low Complexity: 1 Procedure     PT G Codes:        Pg [phone]   Teauna Dubach 09/06/2016, 1:26 PM

## 2016-09-07 DIAGNOSIS — R931 Abnormal findings on diagnostic imaging of heart and coronary circulation: Secondary | ICD-10-CM

## 2016-09-07 LAB — CBC
HCT: 30.7 % — ABNORMAL LOW (ref 39.0–52.0)
Hemoglobin: 10.8 g/dL — ABNORMAL LOW (ref 13.0–17.0)
MCH: 34.5 pg — AB (ref 26.0–34.0)
MCHC: 35.2 g/dL (ref 30.0–36.0)
MCV: 98.1 fL (ref 78.0–100.0)
PLATELETS: 196 10*3/uL (ref 150–400)
RBC: 3.13 MIL/uL — ABNORMAL LOW (ref 4.22–5.81)
RDW: 16.4 % — ABNORMAL HIGH (ref 11.5–15.5)
WBC: 10.4 10*3/uL (ref 4.0–10.5)

## 2016-09-07 LAB — GLUCOSE, CAPILLARY
GLUCOSE-CAPILLARY: 198 mg/dL — AB (ref 65–99)
GLUCOSE-CAPILLARY: 218 mg/dL — AB (ref 65–99)
Glucose-Capillary: 219 mg/dL — ABNORMAL HIGH (ref 65–99)
Glucose-Capillary: 321 mg/dL — ABNORMAL HIGH (ref 65–99)

## 2016-09-07 LAB — BASIC METABOLIC PANEL
Anion gap: 9 (ref 5–15)
BUN: 57 mg/dL — AB (ref 6–20)
CHLORIDE: 96 mmol/L — AB (ref 101–111)
CO2: 22 mmol/L (ref 22–32)
CREATININE: 1.48 mg/dL — AB (ref 0.61–1.24)
Calcium: 9.2 mg/dL (ref 8.9–10.3)
GFR calc Af Amer: 47 mL/min — ABNORMAL LOW (ref >60)
GFR, EST NON AFRICAN AMERICAN: 41 mL/min — AB (ref >60)
Glucose, Bld: 208 mg/dL — ABNORMAL HIGH (ref 65–99)
Potassium: 4.6 mmol/L (ref 3.5–5.1)
SODIUM: 127 mmol/L — AB (ref 135–145)

## 2016-09-07 LAB — SODIUM, URINE, RANDOM: Sodium, Ur: 10 mmol/L

## 2016-09-07 LAB — OSMOLALITY, URINE: OSMOLALITY UR: 393 mosm/kg (ref 300–900)

## 2016-09-07 MED ORDER — PREDNISONE 20 MG PO TABS
20.0000 mg | ORAL_TABLET | Freq: Once | ORAL | Status: AC
Start: 2016-09-07 — End: 2016-09-07
  Administered 2016-09-07: 20 mg via ORAL
  Filled 2016-09-07: qty 1

## 2016-09-07 MED ORDER — HYDRALAZINE HCL 50 MG PO TABS
50.0000 mg | ORAL_TABLET | Freq: Three times a day (TID) | ORAL | Status: DC
  Administered 2016-09-07 – 2016-09-11 (×12): 50 mg via ORAL
  Filled 2016-09-07 (×12): qty 1

## 2016-09-07 MED ORDER — ISOSORBIDE DINITRATE 10 MG PO TABS
10.0000 mg | ORAL_TABLET | Freq: Three times a day (TID) | ORAL | Status: DC
  Administered 2016-09-07 – 2016-09-11 (×13): 10 mg via ORAL
  Filled 2016-09-07 (×15): qty 1

## 2016-09-07 NOTE — Progress Notes (Signed)
PROGRESS NOTE    Derrick Fry  VOZ:[id number] DOB: [birth year] DOA: 09/03/2016 PCP: Colon Branch, MD   Brief Narrative: 83 -year-old male with known past medical history of abdominal aortic aneurysm, presents with 1 week of back pain radiating to her shoulder blades and between the shoulder blades. PMH of gout on prednisone, LLE DVT & PE - on xarelto. CT angio: High density material seen along the medial wall of the descending aorta, concerning for acute aortic syndrome, possibly small area of dissection or penetrating ulcer, which is not well visualized on this study, targeting better visualization of the pulmonary arteries. CVTS was consulted and patient was deen not candidate for surgery.    Assessment & Plan:   Active Problems:   Aortic dissection (HCC)  1-Aortic dissection;  Plan for SBP less than 140.  Continue with metoprolol.  Hydralazine PRN. Would avoid further contrast for now due to worsening renal function.  Might need to increased  hydralazine if BP continue to be elevated.  MRA; possible dissection thrombus stable. Discussed MRA results with Dr Lerry Paterson, no stent, or Sx indicated. BP controlled.   Cardiomyopathy;  EF 35 % by ECHO.  Cardiology consulted and following.  No ACE due to AKI.   DM; Continue with lantus. Continue with SSI.   AKI; Baseline 1.4 p-contrast, s/p L nephrectomy 1996 , no evidence of renal a stenosis on duplex Cr has decreased to 1.4. Stable.   Hyponatremia; stop IV fluids.  Repeat labs in am.   Hyperkalemia; resolved.     Gout- on chronic prednisone.  Right knee pain,some improvement with increased dose of prednisone. Will give another dose of prednisone.   LLE DVT/PE 01-2016. Off anticoagulation.     DVT prophylaxis: SCD Code Status: DNR Family Communication: Wife at bedside.  Disposition Plan: remain in the step down unit.   Consultants:   Cardiology  CVTS  CCM admitted patient.    Procedures: \  ECHO Ef 35  %  Renal US>    Antimicrobials:   none   Subjective: He report feeling ok, back pain is better.  Right knee pain is a little better   Objective: Vitals:   09/07/16 0400 09/07/16 0500 09/07/16 0600 09/07/16 0750  BP: (!) 135/51 (!) 139/53 (!) 146/54 (!) 161/56  Pulse: (!) 36 (!) 58 (!) 57   Resp: 17 11 11    Temp:      TempSrc:      SpO2: 100% 98% 98%   Weight:  74.1 kg (163 lb 5.8 oz)    Height:        Intake/Output Summary (Last 24 hours) at 09/07/16 0804 Last data filed at 09/07/16 0600  Gross per 24 hour  Intake           1462.5 ml  Output              625 ml  Net            837.5 ml   Filed Weights   09/05/16 0332 09/06/16 0300 09/07/16 0500  Weight: 70.2 kg (154 lb 12.2 oz) 73.4 kg (161 lb 13.1 oz) 74.1 kg (163 lb 5.8 oz)    Examination:  General exam: NAD Respiratory system: Respiratory effort normal,  Cardiovascular system: S 1, S 2 RRR Gastrointestinal system: Abdomen is non distended, soft, nt Central nervous system: non focal.  Extremities: no edema Skin: no rash      Data Reviewed: I have personally reviewed following labs and imaging studies  CBC:  Recent Labs Lab 09/03/16 1126 09/04/16 0350 09/05/16 0319 09/06/16 0255 09/07/16 0302  WBC 6.6 7.6 7.4 9.0 10.4  NEUTROABS 5.7  --   --   --   --   HGB 10.9* 10.9* 10.0* 10.6* 10.8*  HCT 31.8* 31.5* 29.2* 30.6* 30.7*  MCV 100.0 98.7 97.7 98.4 98.1  PLT 122* 124* 136* 172 177   Basic Metabolic Panel:  Recent Labs Lab 09/03/16 1126 09/04/16 0350 09/05/16 0319 09/06/16 0255 09/07/16 0302  NA 132* 134* 130* 129* 127*  K 4.1 4.7 4.3 5.2* 4.6  CL 98* 100* 97* 95* 96*  CO2 26 25 24 25 22   GLUCOSE 130* 117* 173* 189* 208*  BUN 42* 42* 55* 61* 57*  CREATININE 1.49* 1.54* 1.78* 1.81* 1.48*  CALCIUM 9.0 8.9 8.8* 9.3 9.2  MG  --  2.1 2.3  --   --   PHOS  --  4.2 3.9  --   --    GFR: Estimated Creatinine Clearance: 32.9 mL/min (A) (by C-G formula based on SCr of 1.48 mg/dL  (H)). Liver Function Tests:  Recent Labs Lab 09/03/16 1126  AST 138*  ALT 117*  ALKPHOS 88  BILITOT 0.7  PROT 6.1*  ALBUMIN 2.3*   No results for input(s): LIPASE, AMYLASE in the last 168 hours. No results for input(s): AMMONIA in the last 168 hours. Coagulation Profile: No results for input(s): INR, PROTIME in the last 168 hours. Cardiac Enzymes: No results for input(s): CKTOTAL, CKMB, CKMBINDEX, TROPONINI in the last 168 hours. BNP (last 3 results) No results for input(s): PROBNP in the last 8760 hours. HbA1C: No results for input(s): HGBA1C in the last 72 hours. CBG:  Recent Labs Lab 09/06/16 0758 09/06/16 1300 09/06/16 1612 09/06/16 2124 09/07/16 0727  GLUCAP 198* 184* 219* 195* 198*   Lipid Profile: No results for input(s): CHOL, HDL, LDLCALC, TRIG, CHOLHDL, LDLDIRECT in the last 72 hours. Thyroid Function Tests: No results for input(s): TSH, T4TOTAL, FREET4, T3FREE, THYROIDAB in the last 72 hours. Anemia Panel: No results for input(s): VITAMINB12, FOLATE, FERRITIN, TIBC, IRON, RETICCTPCT in the last 72 hours. Sepsis Labs: No results for input(s): PROCALCITON, LATICACIDVEN in the last 168 hours.  Recent Results (from the past 240 hour(s))  MRSA PCR Screening     Status: None   Collection Time: 09/05/16 10:12 AM  Result Value Ref Range Status   MRSA by PCR NEGATIVE NEGATIVE Final    Comment:        The GeneXpert MRSA Assay (FDA approved for NASAL specimens only), is one component of a comprehensive MRSA colonization surveillance program. It is not intended to diagnose MRSA infection nor to guide or monitor treatment for MRSA infections.          Radiology Studies: Mr Jodene Nam Chest Wo Contrast  Result Date: 09/06/2016 CLINICAL DATA:  83 year old with mid back pain. Evaluate for aortic dissection. EXAM: MRA CHEST WITH OR WITHOUT CONTRAST TECHNIQUE: Angiographic images of the chest were obtained using MRA technique without contrast. CONTRAST:  None  COMPARISON:  Chest CT 09/03/2016 and CT from 02/09/2016 FINDINGS: VASCULAR Aorta: Mid ascending thoracic aorta is ectatic measuring 3.8 cm but no evidence for a dissection involving the ascending thoracic aorta or aortic arch. No gross abnormality involving the great vessels. Again noted is atherosclerotic plaque involving the descending thoracic aorta. In addition, there is abnormal signal along the medial aspect of the proximal descending thoracic aorta and this corresponds with the recent CTA findings. The configuration is concerning  for an intramural hematoma or limited dissection at this area. The proximal descending thoracic aorta measures up to 4.4 cm and similar to the recent CTA. Again noted is a small amount of fluid posterior to the mid descending thoracic aorta but this may actually represent pleural fluid and similar to the recent CTA. This dissection or intramural hematoma does not extend into the descending thoracic aorta or the aortic arch. Heart: Heart size is within normal limits.  Post CABG changes. Pulmonary Arteries: Stable appearance of the main pulmonary arteries. Other: Again noted are parenchymal densities in the lower lungs but limited evaluation on this MR examination. NON-VASCULAR Musculoskeletal: No gross abnormality to the spinal canal or bones. Upper abdomen: Evidence for a solitary right kidney with an extrarenal pelvis. Extensive motion artifact. Limited evaluation of the upper abdominal structures. IMPRESSION: Abnormal appearance of the proximal descending thoracic aorta. Findings remain compatible with an intramural hematoma or limited aortic dissection in this area. This aortic process is stable since 09/03/2016. Please note that the exam has technical limitations due to motion artifact and the lack of intravenous contrast. Ideally, this aortic process could be followed with a dedicated CTA dissection protocol, with and without contrast, if the patient's renal function can  tolerate it. Again noted is a small amount of fluid just posterior to the mid descending thoracic aorta which may actually be in the pleural space and similar to the recent CTA. 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 Findings are similar to the prior abdominal CT on 02/09/2016. Electronically Signed   By: Markus Daft M.D.   On: 09/06/2016 12:24   US Renal  Result Date: 09/06/2016 CLINICAL DATA:  Acute kidney injury.  Left nephrectomy 1996. EXAM: RENAL / URINARY TRACT ULTRASOUND COMPLETE COMPARISON:  07/22/2012 and CT 01/10/2013 FINDINGS: Right Kidney: Length: 12.5 cm. Echogenicity within normal limits. No mass or hydronephrosis visualized. Cysts versus prominent renal pelvis measuring 3.4 cm (prominent extrarenal pelvis noted on previous CT). Left Kidney: Surgically absent. Bladder: Appears normal for degree of bladder distention. IMPRESSION: Normal size right kidney without hydronephrosis. Mildly prominent right renal pelvis versus 3.4 cm cyst. Surgical absence of the left kidney. Electronically Signed   By: Marin Olp M.D.   On: 09/06/2016 12:40        Scheduled Meds: . famotidine  20 mg Oral Daily  . gabapentin  200 mg Oral TID  . hydrALAZINE  25 mg Oral Q8H  . insulin aspart  0-15 Units Subcutaneous TID WC  . insulin aspart  0-5 Units Subcutaneous QHS  . insulin glargine  6 Units Subcutaneous Daily  . lactulose  20 g Oral BID  . metoprolol tartrate  25 mg Oral BID  . predniSONE  2.5 mg Oral QAC breakfast  . predniSONE  20 mg Oral Once   Continuous Infusions:    LOS: 4 days    Time spent: 35 minutes.     Elmarie Shiley, MD [facility] Pager [phone]  If 7PM-7AM, please contact night-coverage [email or web address] Password [email or web address] 09/07/2016, 8:04 AM

## 2016-09-07 NOTE — Progress Notes (Signed)
Physical Therapy Treatment Patient Details Name: Derrick Fry MRN: [id number] DOB: Oct 22, 1927 Today's Date: 09/07/2016    History of Present Illness Pt admitted through ED with c/o upper back pain and hx of Psoriatic Arthritis, AAA, CAD, DM with neuropathy, MI, CABG, L TKR, L nephrectomy, and L hand knuckle replacement    PT Comments    Improvement in activity tolerance with pt able to walk across room.  Pt continues ltd by R knee pain.   Follow Up Recommendations  Home health PT     Equipment Recommendations  None recommended by PT    Recommendations for Other Services OT consult     Precautions / Restrictions Precautions Precautions: Fall;Other (comment) Restrictions Weight Bearing Restrictions: No RLE Weight Bearing: Weight bearing as tolerated    Mobility  Bed Mobility Overal bed mobility: Needs Assistance Bed Mobility: Supine to Sit     Supine to sit: Min guard;HOB elevated     General bed mobility comments: Increased time with HOB elevated  Transfers Overall transfer level: Needs assistance Equipment used: Rolling walker (2 wheeled) Transfers: Sit to/from Stand Sit to Stand: Min assist;+2 safety/equipment;From elevated surface         General transfer comment: cues for LE management and use of UEs to self assist  Ambulation/Gait Ambulation/Gait assistance: Min assist;Mod assist;+2 physical assistance;+2 safety/equipment Ambulation Distance (Feet): 11 Feet Assistive device: Rolling walker (2 wheeled) Gait Pattern/deviations: Step-to pattern;Decreased step length - right;Decreased step length - left;Shuffle;Trunk flexed Gait velocity: decr Gait velocity interpretation: Below normal speed for age/gender General Gait Details: Step-by-step cues for sequence, posture, position from RW and increased UE WB.  Pt tolerating min WB on R LE 2* pain   Stairs            Wheelchair Mobility    Modified Rankin (Stroke Patients Only)       Balance  Overall balance assessment: Needs assistance Sitting-balance support: Feet supported;No upper extremity supported Sitting balance-Leahy Scale: Good     Standing balance support: Bilateral upper extremity supported Standing balance-Leahy Scale: Poor                              Cognition Arousal/Alertness: Awake/alert Behavior During Therapy: WFL for tasks assessed/performed Overall Cognitive Status: Within Functional Limits for tasks assessed                                        Exercises      General Comments        Pertinent Vitals/Pain Pain Assessment: 0-10 Pain Score: 9  Pain Location: 9/10 R knee pain with WB; 0/10 knee pain at rest.  7/10 upper back pain Pain Descriptors / Indicators: Aching;Grimacing;Sharp Pain Intervention(s): Limited activity within patient's tolerance;Monitored during session    Home Living                      Prior Function            PT Goals (current goals can now be found in the care plan section) Acute Rehab PT Goals Patient Stated Goal: Regain IND PT Goal Formulation: With patient Time For Goal Achievement: 09/20/16 Potential to Achieve Goals: Good Progress towards PT goals: Progressing toward goals    Frequency    Min 3X/week      PT Plan Current plan remains appropriate  Co-evaluation              AM-PAC PT "6 Clicks" Daily Activity  Outcome Measure  Difficulty turning over in bed (including adjusting bedclothes, sheets and blankets)?: A Little Difficulty moving from lying on back to sitting on the side of the bed? : A Little Difficulty sitting down on and standing up from a chair with arms (e.g., wheelchair, bedside commode, etc,.)?: Total Help needed moving to and from a bed to chair (including a wheelchair)?: A Lot Help needed walking in hospital room?: A Lot Help needed climbing 3-5 steps with a railing? : Total 6 Click Score: 12    End of Session Equipment  Utilized During Treatment: Gait belt Activity Tolerance: Patient limited by pain Patient left: Other (comment) [facility]) Nurse Communication: Mobility status PT Visit Diagnosis: Muscle weakness (generalized) (M62.81);Difficulty in walking, not elsewhere classified (R26.2);Pain Pain - Right/Left: Right Pain - part of body: Knee     Time: 2355-7322 PT Time Calculation (min) (ACUTE ONLY): 17 min  Charges:  $Gait Training: 8-22 mins                    G Codes:       Pg [phone]    Serinity Ware 09/07/2016, 12:17 PM

## 2016-09-07 NOTE — Progress Notes (Signed)
Progress Note  Patient Name: Derrick Fry Date of Encounter: 09/07/2016  Primary Cardiologist: Dr. Acie Fredrickson  Subjective   No chest pain or dyspnea. Pt having trouble with gout pain in right knee. His back pain seemed to be better over night.   Inpatient Medications    Scheduled Meds: . famotidine  20 mg Oral Daily  . gabapentin  200 mg Oral TID  . hydrALAZINE  25 mg Oral Q8H  . insulin aspart  0-15 Units Subcutaneous TID WC  . insulin aspart  0-5 Units Subcutaneous QHS  . insulin glargine  6 Units Subcutaneous Daily  . lactulose  20 g Oral BID  . metoprolol tartrate  25 mg Oral BID  . predniSONE  2.5 mg Oral QAC breakfast   Continuous Infusions:  PRN Meds: acetaminophen, docusate, hydrALAZINE, labetalol, morphine injection, ondansetron (ZOFRAN) IV, oxyCODONE-acetaminophen   Vital Signs    Vitals:   09/07/16 0323 09/07/16 0400 09/07/16 0500 09/07/16 0600  BP:  (!) 135/51 (!) 139/53 (!) 146/54  Pulse:  (!) 36 (!) 58 (!) 57  Resp:  17 11 11   Temp: 97.7 F (36.5 C)     TempSrc: Oral     SpO2:  100% 98% 98%  Weight:   163 lb 5.8 oz (74.1 kg)   Height:        Intake/Output Summary (Last 24 hours) at 09/07/16 0739 Last data filed at 09/07/16 0600  Gross per 24 hour  Intake           1462.5 ml  Output              925 ml  Net            537.5 ml   Filed Weights   09/05/16 0332 09/06/16 0300 09/07/16 0500  Weight: 154 lb 12.2 oz (70.2 kg) 161 lb 13.1 oz (73.4 kg) 163 lb 5.8 oz (74.1 kg)    Telemetry    Sinus rhythm with frequent PVCs and occ PAC's. Rates in the 60's. - Personally Reviewed  ECG    No new tracings.  Physical Exam   GEN: No acute distress.   Neck: No JVD Cardiac: RRR, no murmurs, rubs, or gallops.  Respiratory: Clear to auscultation bilaterally. GI: Soft, nontender, non-distended  MS: Right knee with swelling and tenderness. Otherwise not peripheral edema Neuro:  Nonfocal  Psych: Normal affect   Labs    Chemistry Recent Labs Lab  09/03/16 1126  09/05/16 0319 09/06/16 0255 09/07/16 0302  NA 132*  < > 130* 129* 127*  K 4.1  < > 4.3 5.2* 4.6  CL 98*  < > 97* 95* 96*  CO2 26  < > 24 25 22   GLUCOSE 130*  < > 173* 189* 208*  BUN 42*  < > 55* 61* 57*  CREATININE 1.49*  < > 1.78* 1.81* 1.48*  CALCIUM 9.0  < > 8.8* 9.3 9.2  PROT 6.1*  --   --   --   --   ALBUMIN 2.3*  --   --   --   --   AST 138*  --   --   --   --   ALT 117*  --   --   --   --   ALKPHOS 88  --   --   --   --   BILITOT 0.7  --   --   --   --   GFRNONAA 40*  < > 33* 32* 41*  GFRAA 47*  < > 38* 37* 47*  ANIONGAP 8  < > 9 9 9   < > = values in this interval not displayed.   Hematology Recent Labs Lab 09/05/16 0319 09/06/16 0255 09/07/16 0302  WBC 7.4 9.0 10.4  RBC 2.99* 3.11* 3.13*  HGB 10.0* 10.6* 10.8*  HCT 29.2* 30.6* 30.7*  MCV 97.7 98.4 98.1  MCH 33.4 34.1* 34.5*  MCHC 34.2 34.6 35.2  RDW 16.2* 16.4* 16.4*  PLT 136* 172 196    Cardiac EnzymesNo results for input(s): TROPONINI in the last 168 hours.  Recent Labs Lab 09/03/16 1135  TROPIPOC 0.12*     BNPNo results for input(s): BNP, PROBNP in the last 168 hours.   DDimer  Recent Labs Lab 09/03/16 1126  DDIMER >20.00*     Radiology    Mr Jodene Nam Chest Wo Contrast  Result Date: 09/06/2016 CLINICAL DATA:  83 year old with mid back pain. Evaluate for aortic dissection. EXAM: MRA CHEST WITH OR WITHOUT CONTRAST TECHNIQUE: Angiographic images of the chest were obtained using MRA technique without contrast. CONTRAST:  None COMPARISON:  Chest CT 09/03/2016 and CT from 02/09/2016 FINDINGS: VASCULAR Aorta: Mid ascending thoracic aorta is ectatic measuring 3.8 cm but no evidence for a dissection involving the ascending thoracic aorta or aortic arch. No gross abnormality involving the great vessels. Again noted is atherosclerotic plaque involving the descending thoracic aorta. In addition, there is abnormal signal along the medial aspect of the proximal descending thoracic aorta and this  corresponds with the recent CTA findings. The configuration is concerning for an intramural hematoma or limited dissection at this area. The proximal descending thoracic aorta measures up to 4.4 cm and similar to the recent CTA. Again noted is a small amount of fluid posterior to the mid descending thoracic aorta but this may actually represent pleural fluid and similar to the recent CTA. This dissection or intramural hematoma does not extend into the descending thoracic aorta or the aortic arch. Heart: Heart size is within normal limits.  Post CABG changes. Pulmonary Arteries: Stable appearance of the main pulmonary arteries. Other: Again noted are parenchymal densities in the lower lungs but limited evaluation on this MR examination. NON-VASCULAR Musculoskeletal: No gross abnormality to the spinal canal or bones. Upper abdomen: Evidence for a solitary right kidney with an extrarenal pelvis. Extensive motion artifact. Limited evaluation of the upper abdominal structures. IMPRESSION: Abnormal appearance of the proximal descending thoracic aorta. Findings remain compatible with an intramural hematoma or limited aortic dissection in this area. This aortic process is stable since 09/03/2016. Please note that the exam has technical limitations due to motion artifact and the lack of intravenous contrast. Ideally, this aortic process could be followed with a dedicated CTA dissection protocol, with and without contrast, if the patient's renal function can tolerate it. Again noted is a small amount of fluid just posterior to the mid descending thoracic aorta which may actually be in the pleural space and similar to the recent CTA. 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 Findings are similar to the prior abdominal CT on 02/09/2016. Electronically Signed   By: Markus Daft M.D.   On: 09/06/2016 12:24   US Renal  Result Date: 09/06/2016 CLINICAL DATA:  Acute kidney injury.  Left nephrectomy 1996. EXAM: RENAL /  URINARY TRACT ULTRASOUND COMPLETE COMPARISON:  07/22/2012 and CT 01/10/2013 FINDINGS: Right Kidney: Length: 12.5 cm. Echogenicity within normal limits. No mass or hydronephrosis visualized. Cysts versus prominent renal pelvis measuring 3.4 cm (prominent extrarenal pelvis  noted on previous CT). Left Kidney: Surgically absent. Bladder: Appears normal for degree of bladder distention. IMPRESSION: Normal size right kidney without hydronephrosis. Mildly prominent right renal pelvis versus 3.4 cm cyst. Surgical absence of the left kidney. Electronically Signed   By: Marin Olp M.D.   On: 09/06/2016 12:40    Cardiac Studies   Echo 09/04/16  Study Conclusions - Left ventricle: The cavity size was normal. Wall thickness was   increased in a pattern of mild LVH. Systolic function was   moderately reduced. The estimated ejection fraction was in the   range of 35% to 40%. - Aortic valve: There was mild regurgitation. - Mitral valve: Calcified annulus. Mildly thickened leaflets .   There was mild regurgitation. - Right ventricle: Systolic function was mildly reduced.  MRA chest 09/06/16 IMPRESSION: Abnormal appearance of the proximal descending thoracic aorta. Findings remain compatible with an intramural hematoma or limited aortic dissection in this area. This aortic process is stable since 09/03/2016. Please note that the exam has technical limitations due to motion artifact and the lack of intravenous contrast. Ideally, this aortic process could be followed with a dedicated CTA dissection protocol, with and without contrast, if the patient's renal function can tolerate it.  Again noted is a small amount of fluid just posterior to the mid descending thoracic aorta which may actually be in the pleural space and similar to the recent CTA.  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 Findings are similar to the prior abdominal CT on 02/09/2016.  Patient Profile     83 y.o. male admitted  with back pain. ? Descending thoracic dissection vs penetrating ulcer. Distant history of CABG 1997. Solitary kidney with baseline Cr around 1.6  Assessment & Plan    1. Cardiomoypathy: EF 35-40%. No signs of CHF. No chest pain or ACS.  2. CAD: hx of remote CABG. No chest pain.  3. PVC's: chronic and pt is asymptomatic 4. Aortic disease: MRA of the chest revealed mid ascending thoracic aorta that is ectatic measuring 3.8 cm with no evidence of a dissection involving the ascending thoracic aorta or aortic arch. Abnormal appearance of the proximal descending thoracic aorta. Findings remain compatible with an intramural hematoma or limited aortic dissection in this area. This aortic process is stable since 09/03/2016. Study to be reviewed by Dr. Johnsie Cancel.  5. Hypertension: Pt will need good BP control in setting of aortic disease. Hydralazine was increased to 25 mg every 8 hrs yesterday . Continue to monitor.   Signed, Daune Perch, NP  09/07/2016, 7:39 AM    Patient examined chart reviewed. Not very ambulatory yet but has been OOB. Reviewed MRA and no dissection May be small penetrating ulcer in descending thoracic aorta distal to the isthmus. Increase hydralazine and add nitrates for BP.  Ok to d/c home next 24-48 hours when more ambulatory will arrange outpatient f/u with Dr Acie Fredrickson  Exam with clear lungs and soft SEM no edema post sternotomy  Jenkins Rouge

## 2016-09-08 LAB — BASIC METABOLIC PANEL
ANION GAP: 7 (ref 5–15)
BUN: 57 mg/dL — ABNORMAL HIGH (ref 6–20)
CHLORIDE: 100 mmol/L — AB (ref 101–111)
CO2: 23 mmol/L (ref 22–32)
Calcium: 9.4 mg/dL (ref 8.9–10.3)
Creatinine, Ser: 1.36 mg/dL — ABNORMAL HIGH (ref 0.61–1.24)
GFR calc Af Amer: 52 mL/min — ABNORMAL LOW (ref >60)
GFR, EST NON AFRICAN AMERICAN: 45 mL/min — AB (ref >60)
Glucose, Bld: 208 mg/dL — ABNORMAL HIGH (ref 65–99)
POTASSIUM: 4.7 mmol/L (ref 3.5–5.1)
SODIUM: 130 mmol/L — AB (ref 135–145)

## 2016-09-08 LAB — GLUCOSE, CAPILLARY
GLUCOSE-CAPILLARY: 159 mg/dL — AB (ref 65–99)
GLUCOSE-CAPILLARY: 286 mg/dL — AB (ref 65–99)
GLUCOSE-CAPILLARY: 161 mg/dL — AB (ref 65–99)
Glucose-Capillary: 115 mg/dL — ABNORMAL HIGH (ref 65–99)

## 2016-09-08 SURGERY — Surgical Case
Anesthesia: *Unknown

## 2016-09-08 MED ORDER — MORPHINE SULFATE (PF) 10 MG/ML IV SOLN
2.0000 mg | INTRAVENOUS | Status: DC | PRN
  Administered 2016-09-08: 2 mg via INTRAVENOUS
  Filled 2016-09-08: qty 1

## 2016-09-08 MED ORDER — INSULIN GLARGINE 100 UNIT/ML ~~LOC~~ SOLN
8.0000 [IU] | Freq: Every day | SUBCUTANEOUS | Status: DC
  Filled 2016-09-08: qty 0.08

## 2016-09-08 MED ORDER — ALPRAZOLAM 0.25 MG PO TABS
0.2500 mg | ORAL_TABLET | Freq: Two times a day (BID) | ORAL | Status: DC | PRN
  Administered 2016-09-09: 0.25 mg via ORAL
  Filled 2016-09-08: qty 1

## 2016-09-08 MED ORDER — PREDNISONE 20 MG PO TABS
20.0000 mg | ORAL_TABLET | Freq: Once | ORAL | Status: AC
  Administered 2016-09-08: 20 mg via ORAL
  Filled 2016-09-08: qty 1

## 2016-09-08 MED ORDER — METOPROLOL TARTRATE 50 MG PO TABS
50.0000 mg | ORAL_TABLET | Freq: Two times a day (BID) | ORAL | Status: DC
  Administered 2016-09-08 – 2016-09-11 (×7): 50 mg via ORAL
  Filled 2016-09-08 (×4): qty 1
  Filled 2016-09-08: qty 2
  Filled 2016-09-08 (×2): qty 1

## 2016-09-08 NOTE — Progress Notes (Signed)
Progress Note  Patient Name: Derrick Fry Date of Encounter: 09/08/2016  Primary Cardiologist: Dr. Acie Fredrickson  Subjective   Seen up in chair. Has some edema of the arms and hands. None in the lower extremities. No chest pain or dyspnea. Still having nagging pain between his shoulder blades.   Inpatient Medications    Scheduled Meds: . famotidine  20 mg Oral Daily  . gabapentin  200 mg Oral TID  . hydrALAZINE  50 mg Oral Q8H  . insulin aspart  0-15 Units Subcutaneous TID WC  . insulin aspart  0-5 Units Subcutaneous QHS  . insulin glargine  6 Units Subcutaneous Daily  . isosorbide dinitrate  10 mg Oral TID  . metoprolol tartrate  25 mg Oral BID  . predniSONE  2.5 mg Oral QAC breakfast   Continuous Infusions:  PRN Meds: acetaminophen, docusate, hydrALAZINE, labetalol, morphine injection, ondansetron (ZOFRAN) IV, oxyCODONE-acetaminophen   Vital Signs    Vitals:   09/08/16 0424 09/08/16 0500 09/08/16 0600 09/08/16 0700  BP:  (!) 155/64 (!) 159/68 (!) 153/56  Pulse:  (!) 57 68 65  Resp:  12 11 15   Temp: 97.6 F (36.4 C)     TempSrc: Oral     SpO2:  96% 96% 96%  Weight: 165 lb 9.1 oz (75.1 kg)     Height:        Intake/Output Summary (Last 24 hours) at 09/08/16 0808 Last data filed at 09/08/16 0700  Gross per 24 hour  Intake             1040 ml  Output              800 ml  Net              240 ml   Filed Weights   09/06/16 0300 09/07/16 0500 09/08/16 0424  Weight: 161 lb 13.1 oz (73.4 kg) 163 lb 5.8 oz (74.1 kg) 165 lb 9.1 oz (75.1 kg)    Telemetry    Sinus rhythm with frequent PVCs and occ PAC's. Rates in the 60's. - Personally Reviewed  ECG    No new tracings.  Physical Exam   GEN: No acute distress.   Neck: No JVD Cardiac: RRR, no murmurs, rubs, or gallops.  Respiratory: Clear to auscultation bilaterally. GI: Soft, nontender, non-distended  MS: dependent edema of hands and arms. No LE edema Neuro:  Nonfocal  Psych: Normal affect   Labs      Chemistry Recent Labs Lab 09/03/16 1126  09/06/16 0255 09/07/16 0302 09/08/16 0313  NA 132*  < > 129* 127* 130*  K 4.1  < > 5.2* 4.6 4.7  CL 98*  < > 95* 96* 100*  CO2 26  < > 25 22 23   GLUCOSE 130*  < > 189* 208* 208*  BUN 42*  < > 61* 57* 57*  CREATININE 1.49*  < > 1.81* 1.48* 1.36*  CALCIUM 9.0  < > 9.3 9.2 9.4  PROT 6.1*  --   --   --   --   ALBUMIN 2.3*  --   --   --   --   AST 138*  --   --   --   --   ALT 117*  --   --   --   --   ALKPHOS 88  --   --   --   --   BILITOT 0.7  --   --   --   --  GFRNONAA 40*  < > 32* 41* 45*  GFRAA 47*  < > 37* 47* 52*  ANIONGAP 8  < > 9 9 7   < > = values in this interval not displayed.   Hematology  Recent Labs Lab 09/05/16 0319 09/06/16 0255 09/07/16 0302  WBC 7.4 9.0 10.4  RBC 2.99* 3.11* 3.13*  HGB 10.0* 10.6* 10.8*  HCT 29.2* 30.6* 30.7*  MCV 97.7 98.4 98.1  MCH 33.4 34.1* 34.5*  MCHC 34.2 34.6 35.2  RDW 16.2* 16.4* 16.4*  PLT 136* 172 196    Cardiac EnzymesNo results for input(s): TROPONINI in the last 168 hours.   Recent Labs Lab 09/03/16 1135  TROPIPOC 0.12*     BNPNo results for input(s): BNP, PROBNP in the last 168 hours.   DDimer   Recent Labs Lab 09/03/16 1126  DDIMER >20.00*     Radiology    Mr Jodene Nam Chest Wo Contrast  Result Date: 09/06/2016 CLINICAL DATA:  83 year old with mid back pain. Evaluate for aortic dissection. EXAM: MRA CHEST WITH OR WITHOUT CONTRAST TECHNIQUE: Angiographic images of the chest were obtained using MRA technique without contrast. CONTRAST:  None COMPARISON:  Chest CT 09/03/2016 and CT from 02/09/2016 FINDINGS: VASCULAR Aorta: Mid ascending thoracic aorta is ectatic measuring 3.8 cm but no evidence for a dissection involving the ascending thoracic aorta or aortic arch. No gross abnormality involving the great vessels. Again noted is atherosclerotic plaque involving the descending thoracic aorta. In addition, there is abnormal signal along the medial aspect of the proximal  descending thoracic aorta and this corresponds with the recent CTA findings. The configuration is concerning for an intramural hematoma or limited dissection at this area. The proximal descending thoracic aorta measures up to 4.4 cm and similar to the recent CTA. Again noted is a small amount of fluid posterior to the mid descending thoracic aorta but this may actually represent pleural fluid and similar to the recent CTA. This dissection or intramural hematoma does not extend into the descending thoracic aorta or the aortic arch. Heart: Heart size is within normal limits.  Post CABG changes. Pulmonary Arteries: Stable appearance of the main pulmonary arteries. Other: Again noted are parenchymal densities in the lower lungs but limited evaluation on this MR examination. NON-VASCULAR Musculoskeletal: No gross abnormality to the spinal canal or bones. Upper abdomen: Evidence for a solitary right kidney with an extrarenal pelvis. Extensive motion artifact. Limited evaluation of the upper abdominal structures. IMPRESSION: Abnormal appearance of the proximal descending thoracic aorta. Findings remain compatible with an intramural hematoma or limited aortic dissection in this area. This aortic process is stable since 09/03/2016. Please note that the exam has technical limitations due to motion artifact and the lack of intravenous contrast. Ideally, this aortic process could be followed with a dedicated CTA dissection protocol, with and without contrast, if the patient's renal function can tolerate it. Again noted is a small amount of fluid just posterior to the mid descending thoracic aorta which may actually be in the pleural space and similar to the recent CTA. 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 Findings are similar to the prior abdominal CT on 02/09/2016. Electronically Signed   By: Markus Daft M.D.   On: 09/06/2016 12:24   US Renal  Result Date: 09/06/2016 CLINICAL DATA:  Acute kidney injury.  Left  nephrectomy 1996. EXAM: RENAL / URINARY TRACT ULTRASOUND COMPLETE COMPARISON:  07/22/2012 and CT 01/10/2013 FINDINGS: Right Kidney: Length: 12.5 cm. Echogenicity within normal limits. No mass or hydronephrosis  visualized. Cysts versus prominent renal pelvis measuring 3.4 cm (prominent extrarenal pelvis noted on previous CT). Left Kidney: Surgically absent. Bladder: Appears normal for degree of bladder distention. IMPRESSION: Normal size right kidney without hydronephrosis. Mildly prominent right renal pelvis versus 3.4 cm cyst. Surgical absence of the left kidney. Electronically Signed   By: Marin Olp M.D.   On: 09/06/2016 12:40    Cardiac Studies   Echo 09/04/16  Study Conclusions - Left ventricle: The cavity size was normal. Wall thickness was   increased in a pattern of mild LVH. Systolic function was   moderately reduced. The estimated ejection fraction was in the   range of 35% to 40%. - Aortic valve: There was mild regurgitation. - Mitral valve: Calcified annulus. Mildly thickened leaflets .   There was mild regurgitation. - Right ventricle: Systolic function was mildly reduced.  MRA chest 09/06/16 IMPRESSION: Abnormal appearance of the proximal descending thoracic aorta. Findings remain compatible with an intramural hematoma or limited aortic dissection in this area. This aortic process is stable since 09/03/2016. Please note that the exam has technical limitations due to motion artifact and the lack of intravenous contrast. Ideally, this aortic process could be followed with a dedicated CTA dissection protocol, with and without contrast, if the patient's renal function can tolerate it.  Again noted is a small amount of fluid just posterior to the mid descending thoracic aorta which may actually be in the pleural space and similar to the recent CTA.  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 Findings are similar to the prior abdominal CT on 02/09/2016.  Patient  Profile     83 y.o. male admitted with back pain. ? Descending thoracic dissection vs penetrating ulcer. Distant history of CABG 1997. Solitary kidney with baseline Cr around 1.6  Assessment & Plan    1. Cardiomoypathy: EF 35-40%. No signs of CHF. No chest pain or ACS.  2. CAD: hx of remote CABG. No chest pain.  3. PVC's: chronic and pt is asymptomatic 4. Aortic disease: MRA of the chest reviewed by Dr. Johnsie Cancel: "no dissection May be small penetrating ulcer in descending thoracic aorta distal to the isthmus.  5. Hypertension: Pt will need good BP control in setting of aortic disease. Hydralazine was increased to 50 mg every 8 hrs yesterday . Isordil was added. BP still on the high side. Continue to monitor.   Signed, Daune Perch, NP  09/08/2016, 8:08 AM    Patient examined chart reviewed Functional status is poor needs PT/OT no real pain in back will increase beta blocker as HR in 70-80 range with PVCls suspect that will be all he needs for BP med adjustment OK to d/c from cardiology perspective   Jenkins Rouge

## 2016-09-08 NOTE — Progress Notes (Signed)
NUTRITION NOTE  Consult received for diet education. Pt currently on Renal, Carb Modified diet. Pt with AKI and hyperkalemia from earlier in admission now resolved.    Lab Results  Component Value Date   HGBA1C 6.7 (H) 08/29/2016    RD provided "Carbohydrate Counting for People with Diabetes" handout from the Academy of [employer]. Discussed different food groups and their effects on blood sugar, emphasizing carbohydrate-containing foods. Provided list of carbohydrates and recommended serving sizes of common foods.  Discussed importance of controlled and consistent carbohydrate intake throughout the day. Provided examples of ways to balance meals/snacks and encouraged intake of high-fiber, whole grain complex carbohydrates. Encouraged 60 grams of carb maximum for meals and 30 grams of carbs maximum for snacks. Pt reports that he usually only eats breakfast and dinner and does not snack in between but he does like to eat ice cream often.   Also provided "Lower Potassium Foods," "Higher Potassium Foods," and "Potassium Content of Foods" handouts from the Academy of [employer] and Dietetics. Encouraged pt not to consume more than 2000 mg (2 grams) of potassium/day.   Reviewed all handouts in depth. No questions or concerns after handouts had been reviewed. Wife is planning to buy a set of measuring cups that she can use specifically for portion control and helping to keep track of K and carb intakes throughout the day.   Teach back method used. Expect good compliance.  Body mass index is 25.93 kg/m. Pt meets criteria for overweight based on current BMI, appropriate for advanced age.  Current diet order is Renal, Carb Modified, patient is consuming approximately 50% of meals at this time. Labs and medications reviewed. No further nutrition interventions warranted at this time. RD contact information provided. If additional nutrition issues arise, please re-consult RD.    Jarome Matin, MS, RD, LDN, [facility] Inpatient Clinical Dietitian Pager # [phone] After hours/weekend pager # [phone]

## 2016-09-08 NOTE — Progress Notes (Signed)
Results for RAMESSES, CRAMPTON (MRN [id number]) as of 09/08/2016 09:09  Ref. Range 09/06/2016 21:24 09/07/2016 07:27 09/07/2016 11:54 09/07/2016 15:45 09/07/2016 22:04  Glucose-Capillary Latest Ref Range: 65 - 99 mg/dL 195 (H) 198 (H) 219 (H) 218 (H) 321 (H)  Noted that blood sugars have been greater than 180 mg/dl. Recommend increasing Lantus to 12 units daily if blood sugars continue to be elevated.  Will continue to monitor blood sugars while in the hospital.   Harvel Ricks RN BSN CDE Diabetes Coordinator Pager: [phone]  8am-5pm

## 2016-09-08 NOTE — Progress Notes (Signed)
CSW consulted for SNF placement. PN reviewed. PT is recommending HHPT at d/c. RNCM will assist with d/c planning needs.  Stylianos Stradling LCSW [phone] 

## 2016-09-08 NOTE — Progress Notes (Signed)
Physical Therapy Treatment Patient Details Name: Derrick Fry MRN: [id number] DOB: Feb 06, 1928 Today's Date: 09/08/2016    History of Present Illness Pt admitted through ED with c/o upper back pain and hx of Psoriatic Arthritis, AAA, CAD, DM with neuropathy, MI, CABG, L TKR, L nephrectomy, and L hand knuckle replacement    PT Comments    Pt progressing with mobility with noted improvement in activity tolerance with decreased pain R knee.  However, pt continues to struggle with transfers and with wife's ltd ability to assist, could benefit from follow up rehab at SNF level to maximize IND and safety prior to return home.   Follow Up Recommendations  Home health PT;SNF     Equipment Recommendations  None recommended by PT    Recommendations for Other Services OT consult     Precautions / Restrictions Precautions Precautions: Fall;Other (comment) Restrictions Weight Bearing Restrictions: No RLE Weight Bearing: Weight bearing as tolerated    Mobility  Bed Mobility               General bed mobility comments: NT - OOB with nursing  Transfers Overall transfer level: Needs assistance Equipment used: Rolling walker (2 wheeled) Transfers: Sit to/from Stand Sit to Stand: Min assist         General transfer comment: cues for transition position LE management and use of UEs to self assist.  Pt able to use R LE more in transfer than on previous sessions  Ambulation/Gait Ambulation/Gait assistance: Min assist;+2 safety/equipment Ambulation Distance (Feet): 48 Feet Assistive device: Rolling walker (2 wheeled) Gait Pattern/deviations: Step-to pattern;Decreased step length - right;Decreased step length - left;Shuffle;Trunk flexed Gait velocity: decr Gait velocity interpretation: Below normal speed for age/gender General Gait Details: cues for sequence, posture, position from RW and increased UE WB.  Pt tolerating increased WB on R LE    Stairs            Wheelchair  Mobility    Modified Rankin (Stroke Patients Only)       Balance Overall balance assessment: Needs assistance Sitting-balance support: Feet supported;No upper extremity supported Sitting balance-Leahy Scale: Good     Standing balance support: Bilateral upper extremity supported Standing balance-Leahy Scale: Poor                              Cognition Arousal/Alertness: Awake/alert Behavior During Therapy: WFL for tasks assessed/performed Overall Cognitive Status: Within Functional Limits for tasks assessed                                        Exercises      General Comments        Pertinent Vitals/Pain Pain Assessment: 0-10 Pain Score: 5  Pain Location: 5/10 R knee pain with WB; 0/10 knee pain at rest.  7/10 upper back pain Pain Descriptors / Indicators: Aching;Sore Pain Intervention(s): Limited activity within patient's tolerance;Monitored during session;Premedicated before session    Home Living                      Prior Function            PT Goals (current goals can now be found in the care plan section) Acute Rehab PT Goals Patient Stated Goal: Regain IND PT Goal Formulation: With patient Time For Goal Achievement: 09/20/16 Potential to Achieve Goals: Good  Progress towards PT goals: Progressing toward goals    Frequency    Min 3X/week      PT Plan Discharge plan needs to be updated    Co-evaluation              AM-PAC PT "6 Clicks" Daily Activity  Outcome Measure  Difficulty turning over in bed (including adjusting bedclothes, sheets and blankets)?: A Little Difficulty moving from lying on back to sitting on the side of the bed? : A Little Difficulty sitting down on and standing up from a chair with arms (e.g., wheelchair, bedside commode, etc,.)?: Total Help needed moving to and from a bed to chair (including a wheelchair)?: A Lot Help needed walking in hospital room?: A Lot Help needed  climbing 3-5 steps with a railing? : A Lot 6 Click Score: 13    End of Session Equipment Utilized During Treatment: Gait belt Activity Tolerance: Patient tolerated treatment well;Patient limited by fatigue Patient left: in chair;with call bell/phone within reach;with nursing/sitter in room Nurse Communication: Mobility status PT Visit Diagnosis: Muscle weakness (generalized) (M62.81);Difficulty in walking, not elsewhere classified (R26.2);Pain Pain - Right/Left: Right Pain - part of body: Knee     Time: 1583-0940 PT Time Calculation (min) (ACUTE ONLY): 22 min  Charges:  $Gait Training: 8-22 mins                    G Codes:       Pg [phone]    Etheridge Geil 09/08/2016, 12:47 PM

## 2016-09-08 NOTE — Progress Notes (Signed)
PROGRESS NOTE    Derrick Fry  YOV:[id number] DOB: 04-28-1927 DOA: 09/03/2016 PCP: Colon Branch, MD   Brief Narrative: 83 -year-old male with known past medical history of abdominal aortic aneurysm, presents with 1 week of back pain radiating to her shoulder blades and between the shoulder blades. PMH of gout on prednisone, LLE DVT & PE - on xarelto. CT angio: High density material seen along the medial wall of the descending aorta, concerning for acute aortic syndrome, possibly small area of dissection or penetrating ulcer, which is not well visualized on this study, targeting better visualization of the pulmonary arteries. CVTS was consulted and patient was deen not candidate for surgery.    Assessment & Plan:   Active Problems:   Aortic dissection (HCC)   Decreased cardiac ejection fraction  1-Aortic dissection;  Plan for SBP less than 140.  Continue with metoprolol.  Hydralazine PRN. Would avoid further contrast for now due to worsening renal function.  Might need to increased  hydralazine if BP continue to be elevated.  MRA; possible dissection thrombus stable. Discussed MRA results with Dr Lerry Paterson, no stent, or Sx indicated. BP controlled.   HTN; Continue with hydralazine, Imdur.  Continue with metoprolol, increase dose.   Cardiomyopathy;  EF 35 % by ECHO.  Cardiology consulted and following.  No ACE due to AKI.   DM; Continue with lantus. Continue with SSI.   AKI; Baseline 1.4 p-contrast, s/p L nephrectomy 1996 , no evidence of renal a stenosis on duplex Cr has decreased to 1.4. Stable.   Hyponatremia; stop IV fluids.  Improved.   Hyperkalemia; resolved.     Gout- on chronic prednisone.  Right knee pain,some improvement with increased dose of prednisone. Will give another dose of prednisone.   LLE DVT/PE 01-2016. Off anticoagulation.     DVT prophylaxis: SCD Code Status: DNR Family Communication: Wife at bedside.  Disposition Plan: transfer to  telemetry.   Consultants:   Cardiology  CVTS  CCM admitted patient.    Procedures: \  ECHO Ef 35 %  Renal US>    Antimicrobials:   none   Subjective: He is doing ok, denies chest pain.  Back pain stable.  Knee pain better.    Objective: Vitals:   09/08/16 0800 09/08/16 0827 09/08/16 0900 09/08/16 1000  BP:  (!) 148/49 (!) 136/37 (!) 158/57  Pulse:  72    Resp:  15 12 12   Temp: (!) 97.5 F (36.4 C)     TempSrc: Oral     SpO2:  98% 98% 98%  Weight:      Height:        Intake/Output Summary (Last 24 hours) at 09/08/16 1022 Last data filed at 09/08/16 0800  Gross per 24 hour  Intake              400 ml  Output             1050 ml  Net             -650 ml   Filed Weights   09/06/16 0300 09/07/16 0500 09/08/16 0424  Weight: 73.4 kg (161 lb 13.1 oz) 74.1 kg (163 lb 5.8 oz) 75.1 kg (165 lb 9.1 oz)    Examination:  General exam: NAD Respiratory system: CTA Cardiovascular system:  S 1, S 2 RRR Gastrointestinal system: BS present, soft, nt Central nervous system: Non focal.  Extremities: no edema Skin: no rash      Data Reviewed: I have personally  reviewed following labs and imaging studies  CBC:  Recent Labs Lab 09/03/16 1126 09/04/16 0350 09/05/16 0319 09/06/16 0255 09/07/16 0302  WBC 6.6 7.6 7.4 9.0 10.4  NEUTROABS 5.7  --   --   --   --   HGB 10.9* 10.9* 10.0* 10.6* 10.8*  HCT 31.8* 31.5* 29.2* 30.6* 30.7*  MCV 100.0 98.7 97.7 98.4 98.1  PLT 122* 124* 136* 172 242   Basic Metabolic Panel:  Recent Labs Lab 09/04/16 0350 09/05/16 0319 09/06/16 0255 09/07/16 0302 09/08/16 0313  NA 134* 130* 129* 127* 130*  K 4.7 4.3 5.2* 4.6 4.7  CL 100* 97* 95* 96* 100*  CO2 25 24 25 22 23   GLUCOSE 117* 173* 189* 208* 208*  BUN 42* 55* 61* 57* 57*  CREATININE 1.54* 1.78* 1.81* 1.48* 1.36*  CALCIUM 8.9 8.8* 9.3 9.2 9.4  MG 2.1 2.3  --   --   --   PHOS 4.2 3.9  --   --   --    GFR: Estimated Creatinine Clearance: 35.8 mL/min (A) (by C-G  formula based on SCr of 1.36 mg/dL (H)). Liver Function Tests:  Recent Labs Lab 09/03/16 1126  AST 138*  ALT 117*  ALKPHOS 88  BILITOT 0.7  PROT 6.1*  ALBUMIN 2.3*   No results for input(s): LIPASE, AMYLASE in the last 168 hours. No results for input(s): AMMONIA in the last 168 hours. Coagulation Profile: No results for input(s): INR, PROTIME in the last 168 hours. Cardiac Enzymes: No results for input(s): CKTOTAL, CKMB, CKMBINDEX, TROPONINI in the last 168 hours. BNP (last 3 results) No results for input(s): PROBNP in the last 8760 hours. HbA1C: No results for input(s): HGBA1C in the last 72 hours. CBG:  Recent Labs Lab 09/07/16 0727 09/07/16 1154 09/07/16 1545 09/07/16 2204 09/08/16 0756  GLUCAP 198* 219* 218* 321* 159*   Lipid Profile: No results for input(s): CHOL, HDL, LDLCALC, TRIG, CHOLHDL, LDLDIRECT in the last 72 hours. Thyroid Function Tests: No results for input(s): TSH, T4TOTAL, FREET4, T3FREE, THYROIDAB in the last 72 hours. Anemia Panel: No results for input(s): VITAMINB12, FOLATE, FERRITIN, TIBC, IRON, RETICCTPCT in the last 72 hours. Sepsis Labs: No results for input(s): PROCALCITON, LATICACIDVEN in the last 168 hours.  Recent Results (from the past 240 hour(s))  MRSA PCR Screening     Status: None   Collection Time: 09/05/16 10:12 AM  Result Value Ref Range Status   MRSA by PCR NEGATIVE NEGATIVE Final    Comment:        The GeneXpert MRSA Assay (FDA approved for NASAL specimens only), is one component of a comprehensive MRSA colonization surveillance program. It is not intended to diagnose MRSA infection nor to guide or monitor treatment for MRSA infections.          Radiology Studies: Mr Jodene Nam Chest Wo Contrast  Result Date: 09/06/2016 CLINICAL DATA:  83 year old with mid back pain. Evaluate for aortic dissection. EXAM: MRA CHEST WITH OR WITHOUT CONTRAST TECHNIQUE: Angiographic images of the chest were obtained using MRA technique  without contrast. CONTRAST:  None COMPARISON:  Chest CT 09/03/2016 and CT from 02/09/2016 FINDINGS: VASCULAR Aorta: Mid ascending thoracic aorta is ectatic measuring 3.8 cm but no evidence for a dissection involving the ascending thoracic aorta or aortic arch. No gross abnormality involving the great vessels. Again noted is atherosclerotic plaque involving the descending thoracic aorta. In addition, there is abnormal signal along the medial aspect of the proximal descending thoracic aorta and this corresponds with the  recent CTA findings. The configuration is concerning for an intramural hematoma or limited dissection at this area. The proximal descending thoracic aorta measures up to 4.4 cm and similar to the recent CTA. Again noted is a small amount of fluid posterior to the mid descending thoracic aorta but this may actually represent pleural fluid and similar to the recent CTA. This dissection or intramural hematoma does not extend into the descending thoracic aorta or the aortic arch. Heart: Heart size is within normal limits.  Post CABG changes. Pulmonary Arteries: Stable appearance of the main pulmonary arteries. Other: Again noted are parenchymal densities in the lower lungs but limited evaluation on this MR examination. NON-VASCULAR Musculoskeletal: No gross abnormality to the spinal canal or bones. Upper abdomen: Evidence for a solitary right kidney with an extrarenal pelvis. Extensive motion artifact. Limited evaluation of the upper abdominal structures. IMPRESSION: Abnormal appearance of the proximal descending thoracic aorta. Findings remain compatible with an intramural hematoma or limited aortic dissection in this area. This aortic process is stable since 09/03/2016. Please note that the exam has technical limitations due to motion artifact and the lack of intravenous contrast. Ideally, this aortic process could be followed with a dedicated CTA dissection protocol, with and without contrast, if the  patient's renal function can tolerate it. Again noted is a small amount of fluid just posterior to the mid descending thoracic aorta which may actually be in the pleural space and similar to the recent CTA. 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 Findings are similar to the prior abdominal CT on 02/09/2016. Electronically Signed   By: Markus Daft M.D.   On: 09/06/2016 12:24   US Renal  Result Date: 09/06/2016 CLINICAL DATA:  Acute kidney injury.  Left nephrectomy 1996. EXAM: RENAL / URINARY TRACT ULTRASOUND COMPLETE COMPARISON:  07/22/2012 and CT 01/10/2013 FINDINGS: Right Kidney: Length: 12.5 cm. Echogenicity within normal limits. No mass or hydronephrosis visualized. Cysts versus prominent renal pelvis measuring 3.4 cm (prominent extrarenal pelvis noted on previous CT). Left Kidney: Surgically absent. Bladder: Appears normal for degree of bladder distention. IMPRESSION: Normal size right kidney without hydronephrosis. Mildly prominent right renal pelvis versus 3.4 cm cyst. Surgical absence of the left kidney. Electronically Signed   By: Marin Olp M.D.   On: 09/06/2016 12:40        Scheduled Meds: . famotidine  20 mg Oral Daily  . gabapentin  200 mg Oral TID  . hydrALAZINE  50 mg Oral Q8H  . insulin aspart  0-15 Units Subcutaneous TID WC  . insulin aspart  0-5 Units Subcutaneous QHS  . insulin glargine  6 Units Subcutaneous Daily  . isosorbide dinitrate  10 mg Oral TID  . metoprolol tartrate  50 mg Oral BID  . predniSONE  2.5 mg Oral QAC breakfast  . predniSONE  20 mg Oral Once   Continuous Infusions:    LOS: 5 days    Time spent: 35 minutes.     Elmarie Shiley, MD [facility] Pager [phone]  If 7PM-7AM, please contact night-coverage [email or web address] Password [email or web address] 09/08/2016, 10:22 AM

## 2016-09-09 LAB — GLUCOSE, CAPILLARY
GLUCOSE-CAPILLARY: 220 mg/dL — AB (ref 65–99)
GLUCOSE-CAPILLARY: 182 mg/dL — AB (ref 65–99)
GLUCOSE-CAPILLARY: 125 mg/dL — AB (ref 65–99)
Glucose-Capillary: 109 mg/dL — ABNORMAL HIGH (ref 65–99)

## 2016-09-09 MED ORDER — INSULIN GLARGINE 100 UNIT/ML ~~LOC~~ SOLN
6.0000 [IU] | Freq: Every day | SUBCUTANEOUS | Status: DC
  Administered 2016-09-09 – 2016-09-11 (×3): 6 [IU] via SUBCUTANEOUS
  Filled 2016-09-09 (×3): qty 0.06

## 2016-09-09 MED ORDER — PREDNISONE 20 MG PO TABS
20.0000 mg | ORAL_TABLET | Freq: Once | ORAL | Status: AC
  Administered 2016-09-09: 20 mg via ORAL
  Filled 2016-09-09: qty 1

## 2016-09-09 MED ORDER — INSULIN GLARGINE 100 UNIT/ML ~~LOC~~ SOLN: 7.0000 [IU] | Freq: Every day | SUBCUTANEOUS | Status: DC

## 2016-09-09 NOTE — Clinical Social Work Note (Signed)
Clinical Social Work Assessment  Patient Details  Name: Derrick Fry MRN: [id number] Date of Birth: 1927/06/18  Date of referral:  09/09/16               Reason for consult:  Facility Placement                Permission sought to share information with:  Facility Sport and exercise psychologist, Family Supports Permission granted to share information::  Yes, Verbal Permission Granted  Name::     [place] SNF's, Wife/family       Housing/Transportation Living arrangements for the past 2 months:  [place] of Information:  Patient, Spouse Patient Interpreter Needed:  None Criminal Activity/Legal Involvement Pertinent to Current Situation/Hospitalization:  No - Comment as needed Significant Relationships:  Adult Children, Other Family Members, Spouse Lives with:  Spouse Do you feel safe going back to the place where you live?  No (After rehab) Need for family participation in patient care:  Yes (Comment) (Pt is alert and oriented but wants family involved)  Care giving concerns:  Lives at home with wife who states that her health would not allow her to be able to lift or help patient with his care needs at present. Agrees to short term SNF.     Social Worker assessment / plan: PT had originally recommended HHPT but then changed recommendation to [facility] or SNF.  MD feels SNF is very appropriate for patient.  Met with patient, wife, several grandchildren and sone in room today to discuss bed search process vs HH for PT.  They feel that at this time SNF would be best option.  Patient/family prefer [facility] or [facility] if possible. SW left messages for admissions directors at both facilities without return calls.  Fl2 completed and active bed search in place for [facility].  Per MD- patient will be medically stable for d/c once bed is located and insurance auth in place.  Auth can be obtained on the weekend.  Employment status:  Retired Office manager PT Recommendations:  [place] / Referral to community resources:  [place]  Patient/Family's Response to care: Pt and wife feel he is doing better and receiving good care.  Feel he is stronger today than yesterday.  Patient/Family's Understanding of and Emotional Response to Diagnosis, Current Treatment, and Prognosis:  Patient was awakened for SW to discuss SNF but he was cooperative and very pleasant.  His wife is noted to be very involved and is aware of his current medical needs. She was hoping to take him home but feels realistically that she cannot manage his care needs at this time.  Emotional Assessment Appearance:  Appears stated age Attitude/Demeanor/Rapport:   (Appropriate for age and situation) Affect (typically observed):  Pleasant, Accepting, Calm (appropriate for age and situation.  ) Orientation:  Oriented to Self, Oriented to Place, Oriented to  Time, Oriented to Situation Alcohol / Substance use:  Other (quit smoking over 44 years ago) Psych involvement (Current and /or in the community):  No (Comment)  Discharge Needs  Concerns to be addressed:  Care Coordination Readmission within the last 30 days:  No Current discharge risk:  None Barriers to Discharge:  Continued Medical Work up [employer] requires preauthorization)   Estill Bakes 09/09/2016, 4:05 PM

## 2016-09-09 NOTE — NC FL2 (Signed)
[place] LEVEL OF CARE SCREENING TOOL     IDENTIFICATION  Patient Name: Derrick Fry Birthdate: Oct 17, 1927 Sex: male Admission Date (Current Location): 09/03/2016  [facility] and [place] Number:  Herbalist and Address:  [facility],  [place] [address] [place]      Provider Number: [id number]  Attending Physician Name and Address:  Elmarie Shiley, MD  Relative Name and Phone Number:       Current Level of Care: Hospital Recommended Level of Care: [place] Prior Approval Number:    Date Approved/Denied:   PASRR Number: [id number]  Discharge Plan: SNF    Current Diagnoses: Patient Active Problem List   Diagnosis Date Noted  . Decreased cardiac ejection fraction   . Aortic dissection ([place]) 09/03/2016  . Acute bilateral thoracic back pain   . Abdominal pain, chronic, epigastric   . Gastritis and gastroduodenitis   . Diarrhea   . Pulmonary embolus ([place])   . Pulmonary embolism ([place]) 02/09/2016  . History of DVT (deep vein thrombosis) 02/09/2016  . Nausea, vomiting and diarrhea 02/09/2016  . Failure to thrive in adult 02/09/2016  . Hyponatremia 02/09/2016  . Loss of weight 02/09/2016  . Protein calorie malnutrition ([place]) 02/09/2016  . Arrhythmia 02/09/2016  . PCP NOTES >>>>> 11/20/2014  . Buzzing in ear 06/04/2013  . Pulmonary fibrosis ([place]) 01/29/2013  . Annual physical exam 07/11/2010  . AAA (abdominal aortic aneurysm) ([place]) 07/11/2010  . High cholesterol 03/23/2009  . Gout 08/22/2007  . GAIT DISTURBANCE 08/22/2007  . Osteoarthritis  04/23/2007  . OSA (obstructive sleep apnea) 01/03/2007  . DM II (diabetes mellitus, type II), controlled ([place]) 09/05/2006  .  peripheral neuropathy --UDS--pain mngmt  09/05/2006  . Essential hypertension 09/05/2006  . CAD (coronary artery disease) 09/05/2006  . RENAL INSUFFICIENCY, CHRONIC 09/05/2006  . SOLITARY KIDNEY, CONGENITAL 09/05/2006  . SLEEP APNEA 09/05/2006     Orientation RESPIRATION BLADDER Height & Weight     Self, Time, Situation, Place  Normal Continent Weight: 156 lb 1.4 oz (70.8 kg) Height:  5\' 7"  (170.2 cm)  BEHAVIORAL SYMPTOMS/MOOD NEUROLOGICAL BOWEL NUTRITION STATUS      Continent Diet (Renal, carb modified)  AMBULATORY STATUS COMMUNICATION OF NEEDS Skin   Extensive Assist Verbally  (Blister on back- some serous drainage)                       Personal Care Assistance Level of Assistance  Bathing, Dressing Bathing Assistance: Limited assistance   Dressing Assistance: Limited assistance     Functional Limitations Info             SPECIAL CARE FACTORS FREQUENCY  PT (By licensed PT), OT (By licensed OT)     PT Frequency: 5 OT Frequency: 5            Contractures Contractures Info: Not present    Additional Factors Info  Code Status, Allergies Code Status Info: DNR Allergies Info: Colchicine           Current Medications (09/09/2016):  This is the current hospital active medication list Current Facility-Administered Medications  Medication Dose Route Frequency Provider Last Rate Last Dose  . acetaminophen (TYLENOL) tablet 650 mg  650 mg Oral Q4H PRN Sharia Reeve, MD      . ALPRAZolam Duanne Moron) tablet 0.25 mg  0.25 mg Oral BID PRN Regalado, Belkys A, MD   0.25 mg at 09/09/16 1012  . docusate (COLACE) 50 MG/5ML liquid  100 mg  100 mg Per Tube BID PRN Sharia Reeve, MD      . famotidine (PEPCID) tablet 20 mg  20 mg Oral Daily Berton Mount, RPH   20 mg at 09/09/16 1012  . gabapentin (NEURONTIN) capsule 200 mg  200 mg Oral TID Sharia Reeve, MD   200 mg at 09/09/16 1012  . hydrALAZINE (APRESOLINE) injection 10-40 mg  10-40 mg Intravenous Q4H PRN Rigoberto Noel, MD   20 mg at 09/08/16 0736  . hydrALAZINE (APRESOLINE) tablet 50 mg  50 mg Oral Q8H Josue Hector, MD   50 mg at 09/09/16 0554  . insulin aspart (novoLOG) injection 0-15 Units  0-15 Units Subcutaneous TID WC Anders Simmonds, MD   5 Units at  09/09/16 1259  . insulin aspart (novoLOG) injection 0-5 Units  0-5 Units Subcutaneous QHS Anders Simmonds, MD   4 Units at 09/07/16 2223  . insulin glargine (LANTUS) injection 6 Units  6 Units Subcutaneous Daily Regalado, Belkys A, MD   6 Units at 09/09/16 1122  . isosorbide dinitrate (ISORDIL) tablet 10 mg  10 mg Oral TID Josue Hector, MD   10 mg at 09/09/16 1011  . labetalol (NORMODYNE,TRANDATE) injection 10-20 mg  10-20 mg Intravenous Q2H PRN Rigoberto Noel, MD   10 mg at 09/07/16 1609  . metoprolol tartrate (LOPRESSOR) tablet 50 mg  50 mg Oral BID Josue Hector, MD   50 mg at 09/09/16 1012  . Morphine Sulfate (PF) SOLN 2-4 mg  2-4 mg Intravenous Q1H PRN Regalado, Belkys A, MD   2 mg at 09/08/16 1407  . ondansetron (ZOFRAN) injection 4 mg  4 mg Intravenous Q6H PRN Sharia Reeve, MD      . oxyCODONE-acetaminophen (PERCOCET/ROXICET) 5-325 MG per tablet 1 tablet  1 tablet Oral QID PRN Sharia Reeve, MD   1 tablet at 09/09/16 0554  . predniSONE (DELTASONE) tablet 2.5 mg  2.5 mg Oral QAC breakfast Regalado, Belkys A, MD   2.5 mg at 09/09/16 8546     Discharge Medications: Please see discharge summary for a list of discharge medications.  Relevant Imaging Results:  Relevant Lab Results:   Additional Information SSN:  [id number]  Williemae Area, LCSW

## 2016-09-09 NOTE — Progress Notes (Signed)
PROGRESS NOTE    Derrick Fry  KDT:[id number] DOB: 22-Apr-1927 DOA: 09/03/2016 PCP: Colon Branch, MD   Brief Narrative: 83 -year-old male with known past medical history of abdominal aortic aneurysm, presents with 1 week of back pain radiating to her shoulder blades and between the shoulder blades. PMH of gout on prednisone, LLE DVT & PE - on xarelto. CT angio: High density material seen along the medial wall of the descending aorta, concerning for acute aortic syndrome, possibly small area of dissection or penetrating ulcer, which is not well visualized on this study, targeting better visualization of the pulmonary arteries. CVTS was consulted and patient was deen not candidate for surgery.    Assessment & Plan:   Active Problems:   Aortic dissection (HCC)   Decreased cardiac ejection fraction  1-Aortic dissection;  Plan for SBP less than 140.  Continue with metoprolol.  Hydralazine PRN. Would avoid further contrast for now due to worsening renal function.  Might need to increased  hydralazine if BP continue to be elevated.  MRA; possible dissection thrombus stable. Discussed MRA results with Dr Lerry Paterson, no stent, or Sx indicated. BP controlled.   HTN; Continue with hydralazine, Imdur.  Continue with metoprolol, increase dose.  Stable.   Cardiomyopathy;  EF 35 % by ECHO.  Cardiology consulted and following.  No ACE due to AKI.   DM; Continue with lantus. Continue with SSI.   AKI; Baseline 1.4 p-contrast, s/p L nephrectomy 1996 , no evidence of renal a stenosis on duplex Cr has decreased to 1.4. Stable.   Hyponatremia; stop IV fluids.  Improved.   Hyperkalemia; resolved.     Gout- on chronic prednisone.  Right knee pain,some improvement with increased dose of prednisone. Prednisone 20 mg   LLE DVT/PE 01-2016. Off anticoagulation.     DVT prophylaxis: SCD Code Status: DNR Family Communication: Wife at bedside.  Disposition Plan: await bed SNF  Consultants:    Cardiology  CVTS  CCM admitted patient.    Procedures: \  ECHO Ef 35 %  Renal US>    Antimicrobials:   none   Subjective: He needs something for anxiety. Feeling ok. Back pain better    Objective: Vitals:   09/08/16 2152 09/08/16 2257 09/09/16 0551 09/09/16 1011  BP: (!) 143/77 125/69 133/68 (!) 144/58  Pulse: 69  68 77  Resp: 18  16   Temp: 98.4 F (36.9 C)  97.7 F (36.5 C)   TempSrc: Oral  Oral   SpO2: 98%  98%   Weight:   70.8 kg (156 lb 1.4 oz)   Height:        Intake/Output Summary (Last 24 hours) at 09/09/16 1410 Last data filed at 09/09/16 0900  Gross per 24 hour  Intake              360 ml  Output             1100 ml  Net             -740 ml   Filed Weights   09/08/16 0424 09/08/16 1250 09/09/16 0551  Weight: 75.1 kg (165 lb 9.1 oz) 71.7 kg (158 lb 1.1 oz) 70.8 kg (156 lb 1.4 oz)    Examination:  General exam: NAD Respiratory system: CTA Cardiovascular system:  S 1, S 2 RRR Gastrointestinal system: BS present, soft, nt Central nervous system: non focal.  Extremities: no edema Skin: no rash      Data Reviewed: I have personally reviewed  following labs and imaging studies  CBC:  Recent Labs Lab 09/03/16 1126 09/04/16 0350 09/05/16 0319 09/06/16 0255 09/07/16 0302  WBC 6.6 7.6 7.4 9.0 10.4  NEUTROABS 5.7  --   --   --   --   HGB 10.9* 10.9* 10.0* 10.6* 10.8*  HCT 31.8* 31.5* 29.2* 30.6* 30.7*  MCV 100.0 98.7 97.7 98.4 98.1  PLT 122* 124* 136* 172 355   Basic Metabolic Panel:  Recent Labs Lab 09/04/16 0350 09/05/16 0319 09/06/16 0255 09/07/16 0302 09/08/16 0313  NA 134* 130* 129* 127* 130*  K 4.7 4.3 5.2* 4.6 4.7  CL 100* 97* 95* 96* 100*  CO2 25 24 25 22 23   GLUCOSE 117* 173* 189* 208* 208*  BUN 42* 55* 61* 57* 57*  CREATININE 1.54* 1.78* 1.81* 1.48* 1.36*  CALCIUM 8.9 8.8* 9.3 9.2 9.4  MG 2.1 2.3  --   --   --   PHOS 4.2 3.9  --   --   --    GFR: Estimated Creatinine Clearance: 35.8 mL/min (A) (by C-G  formula based on SCr of 1.36 mg/dL (H)). Liver Function Tests:  Recent Labs Lab 09/03/16 1126  AST 138*  ALT 117*  ALKPHOS 88  BILITOT 0.7  PROT 6.1*  ALBUMIN 2.3*   No results for input(s): LIPASE, AMYLASE in the last 168 hours. No results for input(s): AMMONIA in the last 168 hours. Coagulation Profile: No results for input(s): INR, PROTIME in the last 168 hours. Cardiac Enzymes: No results for input(s): CKTOTAL, CKMB, CKMBINDEX, TROPONINI in the last 168 hours. BNP (last 3 results) No results for input(s): PROBNP in the last 8760 hours. HbA1C: No results for input(s): HGBA1C in the last 72 hours. CBG:  Recent Labs Lab 09/08/16 1150 09/08/16 1724 09/08/16 2157 09/09/16 0740 09/09/16 1148  GLUCAP 286* 115* 161* 109* 220*   Lipid Profile: No results for input(s): CHOL, HDL, LDLCALC, TRIG, CHOLHDL, LDLDIRECT in the last 72 hours. Thyroid Function Tests: No results for input(s): TSH, T4TOTAL, FREET4, T3FREE, THYROIDAB in the last 72 hours. Anemia Panel: No results for input(s): VITAMINB12, FOLATE, FERRITIN, TIBC, IRON, RETICCTPCT in the last 72 hours. Sepsis Labs: No results for input(s): PROCALCITON, LATICACIDVEN in the last 168 hours.  Recent Results (from the past 240 hour(s))  MRSA PCR Screening     Status: None   Collection Time: 09/05/16 10:12 AM  Result Value Ref Range Status   MRSA by PCR NEGATIVE NEGATIVE Final    Comment:        The GeneXpert MRSA Assay (FDA approved for NASAL specimens only), is one component of a comprehensive MRSA colonization surveillance program. It is not intended to diagnose MRSA infection nor to guide or monitor treatment for MRSA infections.          Radiology Studies: No results found.      Scheduled Meds: . famotidine  20 mg Oral Daily  . gabapentin  200 mg Oral TID  . hydrALAZINE  50 mg Oral Q8H  . insulin aspart  0-15 Units Subcutaneous TID WC  . insulin aspart  0-5 Units Subcutaneous QHS  . insulin  glargine  6 Units Subcutaneous Daily  . isosorbide dinitrate  10 mg Oral TID  . metoprolol tartrate  50 mg Oral BID  . predniSONE  2.5 mg Oral QAC breakfast   Continuous Infusions:    LOS: 6 days    Time spent: 35 minutes.     Elmarie Shiley, MD [facility] Pager [phone]  If 7PM-7AM, please contact night-coverage [email or web address] Password [email or web address] 09/09/2016, 2:10 PM

## 2016-09-09 NOTE — Clinical Social Work Placement (Signed)
   CLINICAL SOCIAL WORK PLACEMENT  NOTE  Date:  09/09/2016  Patient Details  Name: Derrick Fry MRN: [id number] Date of Birth: December 01, 1927  Clinical Social Work is seeking post-discharge placement for this patient at the [place] level of care (*CSW will initial, date and re-position this form in  chart as items are completed):  Yes   Patient/family provided with [place] Work Department's list of facilities offering this level of care within the geographic area requested by the patient (or if unable, by the patient's family).  Yes   Patient/family informed of their freedom to choose among providers that offer the needed level of care, that participate in Medicare, Medicaid or managed care program needed by the patient, have an available bed and are willing to accept the patient.  Yes   Patient/family informed of [facility]'s ownership interest in [facility] and [facility], as well as of the fact that they are under no obligation to receive care at these facilities.  PASRR submitted to EDS on 09/09/16     PASRR number received on 09/09/16     Existing PASRR number confirmed on       FL2 transmitted to all facilities in geographic area requested by pt/family on 09/09/16     FL2 transmitted to all facilities within larger geographic area on       Patient informed that his/her managed care company has contracts with or will negotiate with certain facilities, including the following:   ([employer])         Patient/family informed of bed offers received.  Patient chooses bed at       Physician recommends and patient chooses bed at      Patient to be transferred to   on  .  Patient to be transferred to facility by Ambulance [facility])     Patient family notified on   of transfer.  Name of family member notified:        PHYSICIAN Please prepare priority discharge summary, including medications, Please sign DNR, Please prepare  prescriptions, Please sign FL2     Additional Comment:    _______________________________________________ Williemae Area, LCSW 09/09/2016, 4:27 PM

## 2016-09-09 NOTE — Progress Notes (Signed)
Physical Therapy Treatment Patient Details Name: Derrick Fry MRN: [id number] DOB: [birth year] Today's Date: 09/09/2016    History of Present Illness Pt admitted through ED with c/o upper back pain and hx of Psoriatic Arthritis, AAA, CAD, DM with neuropathy, MI, CABG, L TKR, L nephrectomy, and L hand knuckle replacement    PT Comments    Pt continues very cooperative and with marked improvement in activity tolerance this pm.     Follow Up Recommendations  Home health PT;SNF     Equipment Recommendations  None recommended by PT    Recommendations for Other Services OT consult     Precautions / Restrictions Precautions Precautions: Fall;Other (comment) Restrictions Weight Bearing Restrictions: No RLE Weight Bearing: Weight bearing as tolerated    Mobility  Bed Mobility Overal bed mobility: Needs Assistance Bed Mobility: Sit to Supine       Sit to supine: Min assist   General bed mobility comments: min assist for LEs   Transfers Overall transfer level: Needs assistance Equipment used: Rolling walker (2 wheeled) Transfers: Sit to/from Stand Sit to Stand: Min assist         General transfer comment: cues for transition position LE management and use of UEs to self assist.  Pt able to use R LE more in transfer than on previous sessions  Ambulation/Gait Ambulation/Gait assistance: Min assist Ambulation Distance (Feet): 90 Feet (twice) Assistive device: Rolling walker (2 wheeled) Gait Pattern/deviations: Step-to pattern;Step-through pattern;Decreased step length - right;Decreased step length - left;Shuffle;Trunk flexed Gait velocity: decr Gait velocity interpretation: Below normal speed for age/gender General Gait Details: cues for sequence, posture, position from RW and increased UE WB.  Pt tolerating increased WB on R LE    Stairs            Wheelchair Mobility    Modified Rankin (Stroke Patients Only)       Balance Overall balance assessment: Needs  assistance Sitting-balance support: Feet supported;No upper extremity supported Sitting balance-Leahy Scale: Good     Standing balance support: No upper extremity supported Standing balance-Leahy Scale: Fair                              Cognition Arousal/Alertness: Awake/alert Behavior During Therapy: WFL for tasks assessed/performed Overall Cognitive Status: Within Functional Limits for tasks assessed                                        Exercises General Exercises - Lower Extremity Ankle Circles/Pumps: AROM;Both;20 reps;Supine    General Comments        Pertinent Vitals/Pain Pain Assessment: 0-10 Pain Score: 6  Pain Location: 6/10 R knee pain with WB; 0/10 knee pain at rest.  3/10 upper back pain Pain Descriptors / Indicators: Aching;Sore Pain Intervention(s): Limited activity within patient's tolerance;Monitored during session    Home Living                      Prior Function            PT Goals (current goals can now be found in the care plan section) Acute Rehab PT Goals Patient Stated Goal: Regain IND PT Goal Formulation: With patient Time For Goal Achievement: 09/20/16 Potential to Achieve Goals: Good Progress towards PT goals: Progressing toward goals    Frequency    Min 3X/week  PT Plan Current plan remains appropriate    Co-evaluation              AM-PAC PT "6 Clicks" Daily Activity  Outcome Measure  Difficulty turning over in bed (including adjusting bedclothes, sheets and blankets)?: A Little Difficulty moving from lying on back to sitting on the side of the bed? : A Little Difficulty sitting down on and standing up from a chair with arms (e.g., wheelchair, bedside commode, etc,.)?: Total Help needed moving to and from a bed to chair (including a wheelchair)?: A Little Help needed walking in hospital room?: A Little Help needed climbing 3-5 steps with a railing? : A Lot 6 Click Score:  15    End of Session Equipment Utilized During Treatment: Gait belt Activity Tolerance: Patient tolerated treatment well Patient left: in bed;with call bell/phone within reach;with family/visitor present Nurse Communication: Mobility status PT Visit Diagnosis: Muscle weakness (generalized) (M62.81);Difficulty in walking, not elsewhere classified (R26.2);Pain Pain - Right/Left: Right Pain - part of body: Knee     Time: 2122-4825 PT Time Calculation (min) (ACUTE ONLY): 19 min  Charges:  $Gait Training: 8-22 mins                    G Codes:       Pg [phone]    Jaidee Stipe 09/09/2016, 5:06 PM

## 2016-09-09 NOTE — Progress Notes (Signed)
Subjective:  He says his back pain is improved.  No complaints of shortness of breath.  Still very weak.  Blood pressure better controlled  Objective:  Vital Signs in the last 24 hours: BP 133/68 (BP Location: Left Arm)   Pulse 68   Temp 97.7 F (36.5 C) (Oral)   Resp 16   Ht 5\' 7"  (1.702 m)   Wt 70.8 kg (156 lb 1.4 oz)   SpO2 98%   BMI 24.45 kg/m   Physical Exam: Pleasant male in no acute distress appears younger than stated age quietly sitting in bed Lungs:  Clear Cardiac:  Regular rhythm, normal S1 and S2, no S3 Extremities:  No edema present  Intake/Output from previous day: 07/06 0701 - 07/07 0700 In: 720 [P.O.:720] Out: 1150 [Urine:1150]  Weight Filed Weights   09/08/16 0424 09/08/16 1250 09/09/16 0551  Weight: 75.1 kg (165 lb 9.1 oz) 71.7 kg (158 lb 1.1 oz) 70.8 kg (156 lb 1.4 oz)    Lab Results: Basic Metabolic Panel:  Recent Labs  09/07/16 0302 09/08/16 0313  NA 127* 130*  K 4.6 4.7  CL 96* 100*  CO2 22 23  GLUCOSE 208* 208*  BUN 57* 57*  CREATININE 1.48* 1.36*   CBC:  Recent Labs  09/07/16 0302  WBC 10.4  HGB 10.8*  HCT 30.7*  MCV 98.1  PLT 196   Telemetry: Personally Reviewed  Normal sinus rhythm with occasional PVCs  Assessment/Plan:  1.  Cardiomyopathy clinically stable with no overt CHF 2.  CAD with previous bypass grafting 3.  Aortic disease with no dissection but possible small penetrating ulcer in descending thoracic aorta 4.  Hypertensive heart disease  Recommendations:  Awaiting PT and OT.  No real change in cardiovascular status and appears clinically stable.  Not operative candidate evidently.     Kerry Hough  MD [facility]  09/09/2016, 8:37 AM

## 2016-09-10 LAB — BASIC METABOLIC PANEL
ANION GAP: 5 (ref 5–15)
BUN: 43 mg/dL — ABNORMAL HIGH (ref 6–20)
CHLORIDE: 101 mmol/L (ref 101–111)
CO2: 26 mmol/L (ref 22–32)
CREATININE: 1.23 mg/dL (ref 0.61–1.24)
Calcium: 9.7 mg/dL (ref 8.9–10.3)
GFR calc non Af Amer: 51 mL/min — ABNORMAL LOW (ref >60)
GFR, EST AFRICAN AMERICAN: 59 mL/min — AB (ref >60)
Glucose, Bld: 105 mg/dL — ABNORMAL HIGH (ref 65–99)
Potassium: 4.7 mmol/L (ref 3.5–5.1)
SODIUM: 132 mmol/L — AB (ref 135–145)

## 2016-09-10 LAB — GLUCOSE, CAPILLARY
GLUCOSE-CAPILLARY: 94 mg/dL (ref 65–99)
GLUCOSE-CAPILLARY: 137 mg/dL — AB (ref 65–99)
Glucose-Capillary: 123 mg/dL — ABNORMAL HIGH (ref 65–99)
Glucose-Capillary: 137 mg/dL — ABNORMAL HIGH (ref 65–99)

## 2016-09-10 MED ORDER — PREDNISONE 20 MG PO TABS
20.0000 mg | ORAL_TABLET | Freq: Once | ORAL | Status: AC
  Administered 2016-09-10: 20 mg via ORAL
  Filled 2016-09-10: qty 1

## 2016-09-10 NOTE — Progress Notes (Signed)
Subjective:  Did not sleep well the early part of the night but ended well.  He still has some mild upper back pain this morning.  No shortness of breath.  Objective:  Vital Signs in the last 24 hours: BP (!) 120/48 (BP Location: Left Arm)   Pulse 62   Temp 98.2 F (36.8 C) (Oral)   Resp 16   Ht 5\' 7"  (1.702 m)   Wt 72 kg (158 lb 11.7 oz)   SpO2 98%   BMI 24.86 kg/m   Physical Exam: Pleasant male in no acute distress appears younger than stated age quietly sitting in bed Lungs:  Clear Cardiac:  Regular rhythm, normal S1 and S2, no S3 Extremities:  No edema present  Intake/Output from previous day: 07/07 0701 - 07/08 0700 In: 360 [P.O.:360] Out: 500 [Urine:500]  Weight Filed Weights   09/08/16 1250 09/09/16 0551 09/10/16 0603  Weight: 71.7 kg (158 lb 1.1 oz) 70.8 kg (156 lb 1.4 oz) 72 kg (158 lb 11.7 oz)    Lab Results: Basic Metabolic Panel:  Recent Labs  09/08/16 0313  NA 130*  K 4.7  CL 100*  CO2 23  GLUCOSE 208*  BUN 57*  CREATININE 1.36*   Telemetry: Personally Reviewed  Normal sinus rhythm with occasional PVCs  Assessment/Plan:  1.  Cardiomyopathy clinically stable with no overt CHF 2.  CAD with previous bypass grafting 3.  Aortic disease with no dissection but possible small penetrating ulcer in descending thoracic aorta 4.  Hypertensive heart disease  Recommendations:  Would recheck renal function in the morning.  Blood pressure is fairly well controlled and awaiting arrangements for rehabilitation.     Kerry Hough  MD [facility]  09/10/2016, 8:31 AM

## 2016-09-10 NOTE — Progress Notes (Signed)
PROGRESS NOTE    Derrick Fry  QIW:[id number] DOB: 1927-12-17 DOA: 09/03/2016 PCP: Colon Branch, MD   Brief Narrative: 83 -year-old male with known past medical history of abdominal aortic aneurysm, presents with 1 week of back pain radiating to her shoulder blades and between the shoulder blades. PMH of gout on prednisone, LLE DVT & PE - on xarelto. CT angio: High density material seen along the medial wall of the descending aorta, concerning for acute aortic syndrome, possibly small area of dissection or penetrating ulcer, which is not well visualized on this study, targeting better visualization of the pulmonary arteries. CVTS was consulted and patient was deen not candidate for surgery.    Assessment & Plan:   Active Problems:   Aortic dissection (HCC)   Decreased cardiac ejection fraction  1-Aortic dissection;  Plan for SBP less than 140.  Continue with metoprolol.  Hydralazine PRN. Would avoid further contrast for now due to worsening renal function.  Might need to increased  hydralazine if BP continue to be elevated.  MRA; possible dissection thrombus stable. Discussed MRA results with Dr Lerry Paterson, no stent, or Sx indicated. BP controlled.  BP well controlled.   HTN; Continue with hydralazine, Imdur.  Continue with metoprolol, increase dose.  Stable.   Cardiomyopathy;  EF 35 % by ECHO.  Cardiology consulted and following.  No ACE due to AKI.   DM; Continue with lantus. Continue with SSI.   AKI; Baseline 1.4 p-contrast, s/p L nephrectomy 1996 , no evidence of renal a stenosis on duplex Cr has decreased to 1.4. Stable.  Repeat labs today   Hyponatremia; stop IV fluids.  Improved.   Hyperkalemia; resolved.     Gout- on chronic prednisone.  Right knee pain,some improvement with increased dose of prednisone. Prednisone 20 mg   LLE DVT/PE 01-2016. Off anticoagulation.     DVT prophylaxis: SCD Code Status: DNR Family Communication: Wife at bedside.    Disposition Plan: await bed SNF  Consultants:   Cardiology  CVTS  CCM admitted patient.    Procedures: \  ECHO Ef 35 %  Renal US>    Antimicrobials:   none   Subjective: Knee pain better, denies dyspnea.    Objective: Vitals:   09/10/16 0010 09/10/16 0603 09/10/16 0854 09/10/16 1400  BP: (!) 107/43 (!) 120/48  (!) 161/73  Pulse:  62 84 66  Resp:  16  16  Temp:  98.2 F (36.8 C)  98.2 F (36.8 C)  TempSrc:  Oral  Oral  SpO2:  98%  100%  Weight:  72 kg (158 lb 11.7 oz)    Height:        Intake/Output Summary (Last 24 hours) at 09/10/16 1542 Last data filed at 09/10/16 1500  Gross per 24 hour  Intake              360 ml  Output                0 ml  Net              360 ml   Filed Weights   09/08/16 1250 09/09/16 0551 09/10/16 0603  Weight: 71.7 kg (158 lb 1.1 oz) 70.8 kg (156 lb 1.4 oz) 72 kg (158 lb 11.7 oz)    Examination:  General exam: NAD Respiratory system: CTA Cardiovascular system:  S 1, S 2 RRR Gastrointestinal system: BS present, soft, nt Central nervous system: non focal.  Extremities: right knee with less edema Skin: no  rash      Data Reviewed: I have personally reviewed following labs and imaging studies  CBC:  Recent Labs Lab 09/04/16 0350 09/05/16 0319 09/06/16 0255 09/07/16 0302  WBC 7.6 7.4 9.0 10.4  HGB 10.9* 10.0* 10.6* 10.8*  HCT 31.5* 29.2* 30.6* 30.7*  MCV 98.7 97.7 98.4 98.1  PLT 124* 136* 172 300   Basic Metabolic Panel:  Recent Labs Lab 09/04/16 0350 09/05/16 0319 09/06/16 0255 09/07/16 0302 09/08/16 0313  NA 134* 130* 129* 127* 130*  K 4.7 4.3 5.2* 4.6 4.7  CL 100* 97* 95* 96* 100*  CO2 25 24 25 22 23   GLUCOSE 117* 173* 189* 208* 208*  BUN 42* 55* 61* 57* 57*  CREATININE 1.54* 1.78* 1.81* 1.48* 1.36*  CALCIUM 8.9 8.8* 9.3 9.2 9.4  MG 2.1 2.3  --   --   --   PHOS 4.2 3.9  --   --   --    GFR: Estimated Creatinine Clearance: 35.8 mL/min (A) (by C-G formula based on SCr of 1.36 mg/dL  (H)). Liver Function Tests: No results for input(s): AST, ALT, ALKPHOS, BILITOT, PROT, ALBUMIN in the last 168 hours. No results for input(s): LIPASE, AMYLASE in the last 168 hours. No results for input(s): AMMONIA in the last 168 hours. Coagulation Profile: No results for input(s): INR, PROTIME in the last 168 hours. Cardiac Enzymes: No results for input(s): CKTOTAL, CKMB, CKMBINDEX, TROPONINI in the last 168 hours. BNP (last 3 results) No results for input(s): PROBNP in the last 8760 hours. HbA1C: No results for input(s): HGBA1C in the last 72 hours. CBG:  Recent Labs Lab 09/09/16 1148 09/09/16 1717 09/09/16 2211 09/10/16 0800 09/10/16 1149  GLUCAP 220* 182* 125* 123* 137*   Lipid Profile: No results for input(s): CHOL, HDL, LDLCALC, TRIG, CHOLHDL, LDLDIRECT in the last 72 hours. Thyroid Function Tests: No results for input(s): TSH, T4TOTAL, FREET4, T3FREE, THYROIDAB in the last 72 hours. Anemia Panel: No results for input(s): VITAMINB12, FOLATE, FERRITIN, TIBC, IRON, RETICCTPCT in the last 72 hours. Sepsis Labs: No results for input(s): PROCALCITON, LATICACIDVEN in the last 168 hours.  Recent Results (from the past 240 hour(s))  MRSA PCR Screening     Status: None   Collection Time: 09/05/16 10:12 AM  Result Value Ref Range Status   MRSA by PCR NEGATIVE NEGATIVE Final    Comment:        The GeneXpert MRSA Assay (FDA approved for NASAL specimens only), is one component of a comprehensive MRSA colonization surveillance program. It is not intended to diagnose MRSA infection nor to guide or monitor treatment for MRSA infections.          Radiology Studies: No results found.      Scheduled Meds: . famotidine  20 mg Oral Daily  . gabapentin  200 mg Oral TID  . hydrALAZINE  50 mg Oral Q8H  . insulin aspart  0-15 Units Subcutaneous TID WC  . insulin aspart  0-5 Units Subcutaneous QHS  . insulin glargine  6 Units Subcutaneous Daily  . isosorbide  dinitrate  10 mg Oral TID  . metoprolol tartrate  50 mg Oral BID  . predniSONE  2.5 mg Oral QAC breakfast   Continuous Infusions:    LOS: 7 days    Time spent: 35 minutes.     Elmarie Shiley, MD [facility] Pager [phone]  If 7PM-7AM, please contact night-coverage [email or web address] Password [facility] 09/10/2016, 3:42 PM

## 2016-09-10 NOTE — Progress Notes (Addendum)
CSW following for disposition/ DC planning.  Pt's family has selected [facility] for [employer]. CSW made contact with facility to confirm and initiated [facility] insurance pre-authorization request. Updated attending MD and family (spouse Wells Guiles)  Will continue following assist with transfer to SNF at DC.   Sharren Bridge, MSW, LCSW Clinical Social Work 09/10/2016 [phone]  [employer] Medicare authorization for SNF received: #[id number], 7 days. Updated facility

## 2016-09-11 DIAGNOSIS — Z86711 Personal history of pulmonary embolism: Secondary | ICD-10-CM | POA: Diagnosis not present

## 2016-09-11 DIAGNOSIS — Z833 Family history of diabetes mellitus: Secondary | ICD-10-CM | POA: Diagnosis not present

## 2016-09-11 DIAGNOSIS — I714 Abdominal aortic aneurysm, without rupture: Secondary | ICD-10-CM | POA: Diagnosis not present

## 2016-09-11 DIAGNOSIS — I252 Old myocardial infarction: Secondary | ICD-10-CM | POA: Diagnosis not present

## 2016-09-11 DIAGNOSIS — R488 Other symbolic dysfunctions: Secondary | ICD-10-CM | POA: Diagnosis not present

## 2016-09-11 DIAGNOSIS — G9341 Metabolic encephalopathy: Secondary | ICD-10-CM | POA: Diagnosis not present

## 2016-09-11 DIAGNOSIS — I429 Cardiomyopathy, unspecified: Secondary | ICD-10-CM | POA: Diagnosis not present

## 2016-09-11 DIAGNOSIS — B4489 Other forms of aspergillosis: Secondary | ICD-10-CM | POA: Diagnosis not present

## 2016-09-11 DIAGNOSIS — I7102 Dissection of abdominal aorta: Secondary | ICD-10-CM | POA: Diagnosis not present

## 2016-09-11 DIAGNOSIS — R4182 Altered mental status, unspecified: Secondary | ICD-10-CM | POA: Diagnosis present

## 2016-09-11 DIAGNOSIS — I2510 Atherosclerotic heart disease of native coronary artery without angina pectoris: Secondary | ICD-10-CM | POA: Diagnosis not present

## 2016-09-11 DIAGNOSIS — G060 Intracranial abscess and granuloma: Secondary | ICD-10-CM | POA: Diagnosis not present

## 2016-09-11 DIAGNOSIS — Z801 Family history of malignant neoplasm of trachea, bronchus and lung: Secondary | ICD-10-CM | POA: Diagnosis not present

## 2016-09-11 DIAGNOSIS — N3000 Acute cystitis without hematuria: Secondary | ICD-10-CM | POA: Diagnosis not present

## 2016-09-11 DIAGNOSIS — Z86718 Personal history of other venous thrombosis and embolism: Secondary | ICD-10-CM | POA: Diagnosis not present

## 2016-09-11 DIAGNOSIS — I110 Hypertensive heart disease with heart failure: Secondary | ICD-10-CM | POA: Diagnosis not present

## 2016-09-11 DIAGNOSIS — Z8249 Family history of ischemic heart disease and other diseases of the circulatory system: Secondary | ICD-10-CM | POA: Diagnosis not present

## 2016-09-11 DIAGNOSIS — I130 Hypertensive heart and chronic kidney disease with heart failure and stage 1 through stage 4 chronic kidney disease, or unspecified chronic kidney disease: Secondary | ICD-10-CM | POA: Diagnosis not present

## 2016-09-11 DIAGNOSIS — E875 Hyperkalemia: Secondary | ICD-10-CM | POA: Diagnosis not present

## 2016-09-11 DIAGNOSIS — M545 Low back pain: Secondary | ICD-10-CM | POA: Diagnosis present

## 2016-09-11 DIAGNOSIS — R509 Fever, unspecified: Secondary | ICD-10-CM | POA: Diagnosis not present

## 2016-09-11 DIAGNOSIS — B488 Other specified mycoses: Secondary | ICD-10-CM | POA: Diagnosis not present

## 2016-09-11 DIAGNOSIS — N183 Chronic kidney disease, stage 3 (moderate): Secondary | ICD-10-CM | POA: Diagnosis not present

## 2016-09-11 DIAGNOSIS — E872 Acidosis: Secondary | ICD-10-CM | POA: Diagnosis not present

## 2016-09-11 DIAGNOSIS — I5043 Acute on chronic combined systolic (congestive) and diastolic (congestive) heart failure: Secondary | ICD-10-CM | POA: Diagnosis not present

## 2016-09-11 DIAGNOSIS — I10 Essential (primary) hypertension: Secondary | ICD-10-CM | POA: Diagnosis not present

## 2016-09-11 DIAGNOSIS — N179 Acute kidney failure, unspecified: Secondary | ICD-10-CM | POA: Diagnosis not present

## 2016-09-11 DIAGNOSIS — R278 Other lack of coordination: Secondary | ICD-10-CM | POA: Diagnosis not present

## 2016-09-11 DIAGNOSIS — I7101 Dissection of thoracic aorta: Secondary | ICD-10-CM | POA: Diagnosis not present

## 2016-09-11 DIAGNOSIS — G9340 Encephalopathy, unspecified: Secondary | ICD-10-CM | POA: Diagnosis not present

## 2016-09-11 DIAGNOSIS — I7100 Dissection of unspecified site of aorta: Secondary | ICD-10-CM | POA: Diagnosis not present

## 2016-09-11 DIAGNOSIS — R2681 Unsteadiness on feet: Secondary | ICD-10-CM | POA: Diagnosis not present

## 2016-09-11 DIAGNOSIS — R4701 Aphasia: Secondary | ICD-10-CM | POA: Diagnosis not present

## 2016-09-11 DIAGNOSIS — D649 Anemia, unspecified: Secondary | ICD-10-CM | POA: Diagnosis not present

## 2016-09-11 DIAGNOSIS — Z79899 Other long term (current) drug therapy: Secondary | ICD-10-CM | POA: Diagnosis not present

## 2016-09-11 DIAGNOSIS — A819 Atypical virus infection of central nervous system, unspecified: Secondary | ICD-10-CM | POA: Diagnosis not present

## 2016-09-11 DIAGNOSIS — G92 Toxic encephalopathy: Secondary | ICD-10-CM | POA: Diagnosis not present

## 2016-09-11 DIAGNOSIS — Z800 Family history of malignant neoplasm of digestive organs: Secondary | ICD-10-CM | POA: Diagnosis not present

## 2016-09-11 DIAGNOSIS — I255 Ischemic cardiomyopathy: Secondary | ICD-10-CM | POA: Diagnosis not present

## 2016-09-11 DIAGNOSIS — B370 Candidal stomatitis: Secondary | ICD-10-CM | POA: Diagnosis not present

## 2016-09-11 DIAGNOSIS — M6281 Muscle weakness (generalized): Secondary | ICD-10-CM | POA: Diagnosis not present

## 2016-09-11 DIAGNOSIS — A4189 Other specified sepsis: Secondary | ICD-10-CM | POA: Diagnosis not present

## 2016-09-11 DIAGNOSIS — J329 Chronic sinusitis, unspecified: Secondary | ICD-10-CM | POA: Diagnosis not present

## 2016-09-11 DIAGNOSIS — Z888 Allergy status to other drugs, medicaments and biological substances status: Secondary | ICD-10-CM | POA: Diagnosis not present

## 2016-09-11 DIAGNOSIS — E46 Unspecified protein-calorie malnutrition: Secondary | ICD-10-CM | POA: Diagnosis not present

## 2016-09-11 DIAGNOSIS — M1990 Unspecified osteoarthritis, unspecified site: Secondary | ICD-10-CM | POA: Diagnosis not present

## 2016-09-11 DIAGNOSIS — Z66 Do not resuscitate: Secondary | ICD-10-CM | POA: Diagnosis not present

## 2016-09-11 DIAGNOSIS — R05 Cough: Secondary | ICD-10-CM | POA: Diagnosis not present

## 2016-09-11 DIAGNOSIS — M1A49X0 Other secondary chronic gout, multiple sites, without tophus (tophi): Secondary | ICD-10-CM | POA: Diagnosis not present

## 2016-09-11 DIAGNOSIS — N390 Urinary tract infection, site not specified: Secondary | ICD-10-CM | POA: Diagnosis not present

## 2016-09-11 DIAGNOSIS — F0390 Unspecified dementia without behavioral disturbance: Secondary | ICD-10-CM | POA: Diagnosis not present

## 2016-09-11 DIAGNOSIS — E785 Hyperlipidemia, unspecified: Secondary | ICD-10-CM | POA: Diagnosis not present

## 2016-09-11 DIAGNOSIS — Z951 Presence of aortocoronary bypass graft: Secondary | ICD-10-CM | POA: Diagnosis not present

## 2016-09-11 DIAGNOSIS — E1140 Type 2 diabetes mellitus with diabetic neuropathy, unspecified: Secondary | ICD-10-CM | POA: Diagnosis present

## 2016-09-11 DIAGNOSIS — B49 Unspecified mycosis: Secondary | ICD-10-CM | POA: Diagnosis not present

## 2016-09-11 DIAGNOSIS — E871 Hypo-osmolality and hyponatremia: Secondary | ICD-10-CM | POA: Diagnosis not present

## 2016-09-11 DIAGNOSIS — G8929 Other chronic pain: Secondary | ICD-10-CM | POA: Diagnosis present

## 2016-09-11 DIAGNOSIS — E1122 Type 2 diabetes mellitus with diabetic chronic kidney disease: Secondary | ICD-10-CM | POA: Diagnosis not present

## 2016-09-11 LAB — BASIC METABOLIC PANEL
ANION GAP: 4 — AB (ref 5–15)
BUN: 45 mg/dL — ABNORMAL HIGH (ref 6–20)
CALCIUM: 9.2 mg/dL (ref 8.9–10.3)
CHLORIDE: 102 mmol/L (ref 101–111)
CO2: 26 mmol/L (ref 22–32)
CREATININE: 1.25 mg/dL — AB (ref 0.61–1.24)
GFR calc non Af Amer: 50 mL/min — ABNORMAL LOW (ref >60)
GFR, EST AFRICAN AMERICAN: 58 mL/min — AB (ref >60)
Glucose, Bld: 109 mg/dL — ABNORMAL HIGH (ref 65–99)
Potassium: 4.8 mmol/L (ref 3.5–5.1)
SODIUM: 132 mmol/L — AB (ref 135–145)

## 2016-09-11 LAB — GLUCOSE, CAPILLARY
Glucose-Capillary: 94 mg/dL (ref 65–99)
Glucose-Capillary: 130 mg/dL — ABNORMAL HIGH (ref 65–99)

## 2016-09-11 MED ORDER — OXYCODONE-ACETAMINOPHEN 5-325 MG PO TABS: 1.0000 | ORAL_TABLET | Freq: Four times a day (QID) | ORAL | 0 refills | Status: DC | PRN

## 2016-09-11 MED ORDER — ALPRAZOLAM 0.25 MG PO TABS: 0.2500 mg | ORAL_TABLET | Freq: Two times a day (BID) | ORAL | 0 refills | Status: DC | PRN

## 2016-09-11 MED ORDER — ISOSORBIDE DINITRATE 10 MG PO TABS: 10.0000 mg | ORAL_TABLET | Freq: Three times a day (TID) | ORAL | 0 refills | Status: DC

## 2016-09-11 MED ORDER — METOPROLOL TARTRATE 50 MG PO TABS: 50.0000 mg | ORAL_TABLET | Freq: Two times a day (BID) | ORAL | 0 refills | Status: DC

## 2016-09-11 MED ORDER — HYDRALAZINE HCL 50 MG PO TABS: 50.0000 mg | ORAL_TABLET | Freq: Three times a day (TID) | ORAL | 0 refills | Status: DC

## 2016-09-11 NOTE — Care Management Important Message (Signed)
Important Message  Patient Details  Name: Derrick Fry MRN: [id number] Date of Birth: 1927-06-10   Medicare Important Message Given:  Yes    Kerin Salen 09/11/2016, 10:22 AMImportant Message  Patient Details  Name: Derrick Fry MRN: [id number] Date of Birth: 06/26/1927   Medicare Important Message Given:  Yes    Kerin Salen 09/11/2016, 10:22 AM

## 2016-09-11 NOTE — Progress Notes (Addendum)
Report given to Dutch Quint, Therapist, sports at [facility] . Pt will be transported by family.

## 2016-09-11 NOTE — Progress Notes (Signed)
Physical Therapy Treatment Patient Details Name: Derrick Fry MRN: [id number] DOB: 1927-04-22 Today's Date: 09/11/2016    History of Present Illness Pt admitted through ED with c/o upper back pain and hx of Psoriatic Arthritis, AAA, CAD, DM with neuropathy, MI, CABG, L TKR, L nephrectomy, and L hand knuckle replacement    PT Comments    Pt reports pain much better today and tolerated increased distance of ambulation.  Pt presents with left lower leg slightly more edema compared to right so encouraged pillow under LEs and ankle pumps.  Pt reports plan to d/c to SNF later today.   Follow Up Recommendations  SNF     Equipment Recommendations  None recommended by PT    Recommendations for Other Services       Precautions / Restrictions Precautions Precautions: Fall Restrictions Weight Bearing Restrictions: No RLE Weight Bearing: Weight bearing as tolerated    Mobility  Bed Mobility               General bed mobility comments: pt up in recliner on arrival  Transfers Overall transfer level: Needs assistance Equipment used: Rolling walker (2 wheeled) Transfers: Sit to/from Stand Sit to Stand: Min guard         General transfer comment: min/guard for safety, able to recall safe hand placement  Ambulation/Gait Ambulation/Gait assistance: Min guard Ambulation Distance (Feet): 400 Feet Assistive device: Rolling walker (2 wheeled) Gait Pattern/deviations: Step-through pattern;Decreased stance time - right;Antalgic Gait velocity: decr   General Gait Details: slightly antalgic gait, verbal cues for posture and RW positioning   Stairs            Wheelchair Mobility    Modified Rankin (Stroke Patients Only)       Balance                                            Cognition Arousal/Alertness: Awake/alert Behavior During Therapy: WFL for tasks assessed/performed Overall Cognitive Status: Within Functional Limits for tasks assessed                                        Exercises      General Comments        Pertinent Vitals/Pain Pain Assessment: 0-10 Pain Score: 4  Pain Location: 4/10 R knee pain with mobility, 0/10 at rest Pain Descriptors / Indicators: Aching;Sore Pain Intervention(s): Limited activity within patient's tolerance;Monitored during session;Repositioned    Home Living                      Prior Function            PT Goals (current goals can now be found in the care plan section) Progress towards PT goals: Progressing toward goals    Frequency    Min 3X/week      PT Plan Current plan remains appropriate    Co-evaluation              AM-PAC PT "6 Clicks" Daily Activity  Outcome Measure  Difficulty turning over in bed (including adjusting bedclothes, sheets and blankets)?: A Little Difficulty moving from lying on back to sitting on the side of the bed? : A Little Difficulty sitting down on and standing up from a chair with arms (e.g., wheelchair,  bedside commode, etc,.)?: A Little Help needed moving to and from a bed to chair (including a wheelchair)?: A Little Help needed walking in hospital room?: A Little Help needed climbing 3-5 steps with a railing? : A Little 6 Click Score: 18    End of Session Equipment Utilized During Treatment: Gait belt Activity Tolerance: Patient tolerated treatment well Patient left: in chair;with call bell/phone within reach;with family/visitor present;with chair alarm set   PT Visit Diagnosis: Difficulty in walking, not elsewhere classified (R26.2)     Time: 1610-9604 PT Time Calculation (min) (ACUTE ONLY): 13 min  Charges:  $Gait Training: 8-22 mins                    G Codes:      Carmelia Bake, PT, DPT 09/11/2016 Pager: [phone]   [place] Ram E 09/11/2016, 12:17 PM

## 2016-09-11 NOTE — Clinical Social Work Placement (Signed)
Pt discharging today to transfer to [facility]. All information sent to facility via the [facility]. Family notified- wife Derrick Fry has opted to transfer pt via private vehicle. Son also aware Report number for RN [phone]  See below for placement details   CLINICAL SOCIAL WORK PLACEMENT  NOTE  Date:  09/11/2016  Patient Details  Name: Derrick Fry MRN: [id number] Date of Birth: 1927-08-01  Clinical Social Work is seeking post-discharge placement for this patient at the [place] level of care (*CSW will initial, date and re-position this form in  chart as items are completed):  Yes   Patient/family provided with [facility] Work Department's list of facilities offering this level of care within the geographic area requested by the patient (or if unable, by the patient's family).  Yes   Patient/family informed of their freedom to choose among providers that offer the needed level of care, that participate in Medicare, Medicaid or managed care program needed by the patient, have an available bed and are willing to accept the patient.  Yes   Patient/family informed of [facility]'s ownership interest in [facility] and [facility], as well as of the fact that they are under no obligation to receive care at these facilities.  PASRR submitted to EDS on 09/09/16     PASRR number received on 09/09/16     Existing PASRR number confirmed on       FL2 transmitted to all facilities in geographic area requested by pt/family on 09/09/16     FL2 transmitted to all facilities within larger geographic area on       Patient informed that his/her managed care company has contracts with or will negotiate with certain facilities, including the following:   ([employer])         Patient/family informed of bed offers received.  Patient chooses bed at    [facility]   Physician recommends and patient chooses bed at [facility]      Patient to be  transferred to   [facility] on  .09/11/16  Patient to be transferred to facility by Family  Patient family notified on 09/11/16   of transfer.  Name of family member notified:      Wife [name]  PHYSICIAN   Additional Comment:    _______________________________________________ Nila Nephew, LCSW 09/11/2016, 10:45 AM  [phone]

## 2016-09-11 NOTE — Discharge Summary (Signed)
Physician Discharge Summary  Derrick Fry WNU:[id number] DOB: 11/03/1927 DOA: 09/03/2016  PCP: Colon Branch, MD  Admit date: 09/03/2016 Discharge date: 09/11/2016  Admitted From: Home  Disposition:  SNF  Recommendations for Outpatient Follow-up:  1. Follow up with PCP in 1-2 weeks 2. Please obtain BMP/CBC in one week 3. Monitor CBG, consider insulin as needed.  4. Follow up with cardiologist for Cardiomyopathy    Discharge Condition: Stable.  CODE STATUS: DNR Diet recommendation: Heart Healthy  Brief/Interim Summary: 83 -year-old male with known past medical history of abdominal aortic aneurysm, presents with 1 week of back pain radiating to her shoulder blades and between the shoulder blades. PMH of gout on prednisone, LLE DVT & PE - on xarelto. CT angio: High density material seen along the medial wall of the descending aorta, concerning for acute aortic syndrome, possibly small area of dissection or penetrating ulcer, which is not well visualized on this study, targeting better visualization of the pulmonary arteries. CVTS was consulted and patient was deen not candidate for surgery.    Assessment & Plan:   Active Problems:   Aortic dissection (HCC)   Decreased cardiac ejection fraction  1-Aortic dissection;  Plan for SBP less than 140.  Continue with metoprolol.  Hydralazine PRN. Would avoid further contrast for now due to worsening renal function.  Might need to increased  hydralazine if BP continue to be elevated.  MRA; possible dissection thrombus stable. Discussed MRA results with Dr Lerry Paterson, no stent, or Sx indicated. BP controlled.  BP well controlled.   HTN; Continue with hydralazine, Imdur.  Continue with metoprolol.  Stable.   Cardiomyopathy;  EF 35 % by ECHO.  Cardiology consulted and following.  No ACE due to AKI. Follow with cardiology. Consider ACE when renal function stable.   DM; CBg in the 100. Will hold lantus. Monitor cbg.   AKI; Baseline  1.4 p-contrast, s/p L nephrectomy 1996 , no evidence of renal a stenosis on duplex Cr has decreased to 1.4. Stable.  Cr stable.   Hyponatremia; stop IV fluids.  Improved.   Hyperkalemia; resolved.     Gout- on chronic prednisone.  Right knee pain,some improvement with increased dose of prednisone.  Resume home dose.   LLE DVT/PE 01-2016. Off anticoagulation.   Discharge Diagnoses:  Active Problems:   Aortic dissection (HCC)   Decreased cardiac ejection fraction    Discharge Instructions  Discharge Instructions    Diet - low sodium heart healthy    Complete by:  As directed    Increase activity slowly    Complete by:  As directed      Allergies as of 09/11/2016      Reactions   Colchicine    Unknown       Medication List    STOP taking these medications   furosemide 40 MG tablet Commonly known as:  LASIX   rivaroxaban 20 MG Tabs tablet Commonly known as:  XARELTO   sucralfate 1 g tablet Commonly known as:  CARAFATE     TAKE these medications   ALPRAZolam 0.25 MG tablet Commonly known as:  XANAX Take 1 tablet (0.25 mg total) by mouth 2 (two) times daily as needed for anxiety.   atorvastatin 80 MG tablet Commonly known as:  LIPITOR Take 1 tablet (80 mg total) by mouth daily.   CENTRUM SILVER tablet Take 1 tablet by mouth daily.   cyanocobalamin 100 MCG tablet Take 100 mcg by mouth daily.   fish oil-omega-3 fatty acids  1000 MG capsule Take 2 g by mouth daily.   Flaxseed Oil 1000 MG Caps Take 1,000 mg by mouth 2 (two) times daily.   folic acid 1 MG tablet Commonly known as:  FOLVITE Take 1 mg by mouth daily.   gabapentin 400 MG capsule Commonly known as:  NEURONTIN Take 2 capsules (800 mg total) by mouth 3 (three) times daily.   glucosamine-chondroitin 500-400 MG tablet Take 1 tablet by mouth daily.   glucose blood test strip Commonly known as:  ONE TOUCH ULTRA TEST Check blood sugar no more than twice daily.   hydrALAZINE 50 MG  tablet Commonly known as:  APRESOLINE Take 1 tablet (50 mg total) by mouth every 8 (eight) hours.   isosorbide dinitrate 10 MG tablet Commonly known as:  ISORDIL Take 1 tablet (10 mg total) by mouth 3 (three) times daily.   methotrexate 2.5 MG tablet Commonly known as:  RHEUMATREX Take 15 mg by mouth once a week. Caution:Chemotherapy. Protect from light. Every Monday   metoprolol tartrate 50 MG tablet Commonly known as:  LOPRESSOR Take 1 tablet (50 mg total) by mouth 2 (two) times daily.   nitroGLYCERIN 0.4 MG SL tablet Commonly known as:  NITROSTAT Place 1 tablet (0.4 mg total) under the tongue every 5 (five) minutes as needed for chest pain.   omeprazole 40 MG capsule Commonly known as:  PRILOSEC Take 1 capsule (40 mg total) by mouth daily.   onetouch ultrasoft lancets Check blood sugar no more than twice daily.   oxyCODONE-acetaminophen 5-325 MG tablet Commonly known as:  PERCOCET/ROXICET Take 1 tablet by mouth 4 (four) times daily as needed for severe pain.   polyethylene glycol packet Commonly known as:  MIRALAX / GLYCOLAX Take 17 g by mouth as needed for mild constipation or moderate constipation.   predniSONE 5 MG tablet Commonly known as:  DELTASONE Take 2.5 mg by mouth daily.   PROBIOTIC-10 PO Take 1 tablet by mouth daily.   ULORIC 40 MG tablet Generic drug:  febuxostat Take 40 mg by mouth daily.       Contact information for follow-up providers    [place], [place], [place] Follow up.   Specialty:  Cardiology Why:  on August 1st at 9:30 for cardiology hospital follow up.  Contact information: [address] [place] [address] [phone]            Contact information for after-discharge care    Destination    [facility] .   Specialty:  [place] information: [place] [phone]                 Allergies  Allergen Reactions  .  Colchicine     Unknown     Consultations:  Cardiology    Procedures/Studies: Dg Chest 2 View  Result Date: 09/03/2016 CLINICAL DATA:  Back pain EXAM: CHEST  2 VIEW COMPARISON:  08/29/2016 FINDINGS: Prior CABG. Heart is borderline in size. Mild peribronchial thickening and interstitial prominence. Scarring in the left base. No acute opacities or effusions. No acute bony abnormality. IMPRESSION: Chronic bronchitic changes. Left basilar scarring. No active disease. Electronically Signed   By: Rolm Baptise M.D.   On: 09/03/2016 12:02   Dg Thoracic Spine W/swimmers  Result Date: 08/29/2016 CLINICAL DATA:  Upper back pain, no known injury, initial encounter EXAM: THORACIC SPINE - 3 VIEWS COMPARISON:  02/09/2016 FINDINGS: Mild degenerative changes are noted. A prior  mild compression deformity is again seen at T6 and stable from the prior exam. No paraspinal mass lesion is seen. No new compression deformity is noted. IMPRESSION: Chronic compression deformity at C6.  No acute abnormality noted. Electronically Signed   By: Inez Catalina M.D.   On: 08/29/2016 11:42   Ct Angio Chest Pe W And/or Wo Contrast  Result Date: 09/03/2016 CLINICAL DATA:  Upper back pain. Clinical concern for pulmonary embolus versus dissection. EXAM: CT ANGIOGRAPHY CHEST WITH CONTRAST TECHNIQUE: Multidetector CT imaging of the chest was performed using the standard protocol during bolus administration of intravenous contrast. Multiplanar CT image reconstructions and MIPs were obtained to evaluate the vascular anatomy. CONTRAST:  80 cc Isovue 370 intravenously. COMPARISON:  None. FINDINGS: Cardiovascular: Satisfactory opacification of the pulmonary arteries to the segmental level. No evidence of pulmonary embolism. Enlarged heart. No pericardial effusion. Heavy calcific atherosclerotic disease of the coronary arteries. Postsurgical changes from CABG. The thoracic aorta is very tortuous, with an ectatic portion of the descending thoracic  aorta measuring 3.8 cm in greatest diameter. Heavy calcified and noncalcified plaque is seen along the aorta with asymmetric noncalcified plaque versus blood clot along the posterior wall of the descending aorta. There is a small amount of high density material along the medial wall of the descending aorta, image 73/sequence 10 extending inferiorly to the level of the mid thorax. Low fluid density material is seen along the posterior wall of the aorta in the distal thorax. Mediastinum/Nodes: No enlarged mediastinal, hilar, or axillary lymph nodes. Thyroid gland, trachea, and esophagus demonstrate no significant findings. Lungs/Pleura: Low lung volumes with mild interstitial lung changes and hypoventilatory changes in the lower lobes. Upper Abdomen: No acute abnormality. Musculoskeletal: No chest wall abnormality. No acute or significant osseous findings. Review of the MIP images confirms the above findings. IMPRESSION: Ectatic aorta with eccentric calcified and noncalcified plaque versus intramural blood clot, especially along the posterior wall of the descending aorta. High density material seen along the medial wall of the descending aorta, concerning for acute aortic syndrome, possibly small area of dissection or penetrating ulcer, which is not well visualized on this study, targeting better visualization of the pulmonary arteries. Surgical consultation is recommended. CT angiogram with and without contrast to better visualize the thoracic aorta may be considered if found clinically feasible. Aortic Atherosclerosis (ICD10-I70.0). No evidence of pulmonary embolus. Enlarged heart with heavily calcified coronary arteries. These results were called by telephone at the time of interpretation on 09/03/2016 at 2:16 pm to Dr. Davonna Belling , who verbally acknowledged these results. Electronically Signed   By: Fidela Salisbury M.D.   On: 09/03/2016 14:22   Mr Jodene Nam Chest Wo Contrast  Result Date: 09/06/2016 CLINICAL  DATA:  83 year old with mid back pain. Evaluate for aortic dissection. EXAM: MRA CHEST WITH OR WITHOUT CONTRAST TECHNIQUE: Angiographic images of the chest were obtained using MRA technique without contrast. CONTRAST:  None COMPARISON:  Chest CT 09/03/2016 and CT from 02/09/2016 FINDINGS: VASCULAR Aorta: Mid ascending thoracic aorta is ectatic measuring 3.8 cm but no evidence for a dissection involving the ascending thoracic aorta or aortic arch. No gross abnormality involving the great vessels. Again noted is atherosclerotic plaque involving the descending thoracic aorta. In addition, there is abnormal signal along the medial aspect of the proximal descending thoracic aorta and this corresponds with the recent CTA findings. The configuration is concerning for an intramural hematoma or limited dissection at this area. The proximal descending thoracic aorta measures up to 4.4 cm and  similar to the recent CTA. Again noted is a small amount of fluid posterior to the mid descending thoracic aorta but this may actually represent pleural fluid and similar to the recent CTA. This dissection or intramural hematoma does not extend into the descending thoracic aorta or the aortic arch. Heart: Heart size is within normal limits.  Post CABG changes. Pulmonary Arteries: Stable appearance of the main pulmonary arteries. Other: Again noted are parenchymal densities in the lower lungs but limited evaluation on this MR examination. NON-VASCULAR Musculoskeletal: No gross abnormality to the spinal canal or bones. Upper abdomen: Evidence for a solitary right kidney with an extrarenal pelvis. Extensive motion artifact. Limited evaluation of the upper abdominal structures. IMPRESSION: Abnormal appearance of the proximal descending thoracic aorta. Findings remain compatible with an intramural hematoma or limited aortic dissection in this area. This aortic process is stable since 09/03/2016. Please note that the exam has technical  limitations due to motion artifact and the lack of intravenous contrast. Ideally, this aortic process could be followed with a dedicated CTA dissection protocol, with and without contrast, if the patient's renal function can tolerate it. Again noted is a small amount of fluid just posterior to the mid descending thoracic aorta which may actually be in the pleural space and similar to the recent CTA. 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 Findings are similar to the prior abdominal CT on 02/09/2016. Electronically Signed   By: Markus Daft M.D.   On: 09/06/2016 12:24   US Renal  Result Date: 09/06/2016 CLINICAL DATA:  Acute kidney injury.  Left nephrectomy 1996. EXAM: RENAL / URINARY TRACT ULTRASOUND COMPLETE COMPARISON:  07/22/2012 and CT 01/10/2013 FINDINGS: Right Kidney: Length: 12.5 cm. Echogenicity within normal limits. No mass or hydronephrosis visualized. Cysts versus prominent renal pelvis measuring 3.4 cm (prominent extrarenal pelvis noted on previous CT). Left Kidney: Surgically absent. Bladder: Appears normal for degree of bladder distention. IMPRESSION: Normal size right kidney without hydronephrosis. Mildly prominent right renal pelvis versus 3.4 cm cyst. Surgical absence of the left kidney. Electronically Signed   By: Marin Olp M.D.   On: 09/06/2016 12:40   Dg Chest Port 1 View  Result Date: 09/04/2016 CLINICAL DATA:  Aortic dissection. EXAM: PORTABLE CHEST 1 VIEW COMPARISON:  CT 09/03/2016.  Chest x-ray 09/03/2016. FINDINGS: Prior CABG. Heart size stable. Tortuous thoracic aorta again noted, reference made to prior CT report 09/03/2016. Heart size stable. Low lung volumes with basilar atelectasis. No pleural effusion or pneumothorax . IMPRESSION: 1. Prior CABG. Heart size stable. Tortuous thoracic aorta again noted, reference made to prior CT report 09/03/2016. 2.  Bibasilar subsegmental atelectasis. Electronically Signed   By: Marcello Moores  Register   On: 09/04/2016 06:48        Subjective: He is feeling well, knee pain better.  Back pain better   Discharge Exam: Vitals:   09/11/16 0425 09/11/16 0819  BP: 120/67   Pulse: (!) 56 80  Resp: 18   Temp: 98.7 F (37.1 C)    Vitals:   09/10/16 2129 09/10/16 2346 09/11/16 0425 09/11/16 0819  BP: (!) 142/62 129/63 120/67   Pulse: 70  (!) 56 80  Resp: 18  18   Temp: 99.4 F (37.4 C)  98.7 F (37.1 C)   TempSrc: Oral  Oral   SpO2: 97%  99%   Weight:   72.8 kg (160 lb 8 oz)   Height:        General: Pt is alert, awake, not in acute distress Cardiovascular:  RRR, S1/S2 +, no rubs, no gallops Respiratory: CTA bilaterally, no wheezing, no rhonchi Abdominal: Soft, NT, ND, bowel sounds + Extremities: no edema, no cyanosis    The results of significant diagnostics from this hospitalization (including imaging, microbiology, ancillary and laboratory) are listed below for reference.     Microbiology: Recent Results (from the past 240 hour(s))  MRSA PCR Screening     Status: None   Collection Time: 09/05/16 10:12 AM  Result Value Ref Range Status   MRSA by PCR NEGATIVE NEGATIVE Final    Comment:        The GeneXpert MRSA Assay (FDA approved for NASAL specimens only), is one component of a comprehensive MRSA colonization surveillance program. It is not intended to diagnose MRSA infection nor to guide or monitor treatment for MRSA infections.      Labs: BNP (last 3 results) No results for input(s): BNP in the last 8760 hours. Basic Metabolic Panel:  Recent Labs Lab 09/05/16 0319 09/06/16 0255 09/07/16 0302 09/08/16 0313 09/10/16 1558 09/11/16 0409  NA 130* 129* 127* 130* 132* 132*  K 4.3 5.2* 4.6 4.7 4.7 4.8  CL 97* 95* 96* 100* 101 102  CO2 24 25 22 23 26 26   GLUCOSE 173* 189* 208* 208* 105* 109*  BUN 55* 61* 57* 57* 43* 45*  CREATININE 1.78* 1.81* 1.48* 1.36* 1.23 1.25*  CALCIUM 8.8* 9.3 9.2 9.4 9.7 9.2  MG 2.3  --   --   --   --   --   PHOS 3.9  --   --   --   --   --     Liver Function Tests: No results for input(s): AST, ALT, ALKPHOS, BILITOT, PROT, ALBUMIN in the last 168 hours. No results for input(s): LIPASE, AMYLASE in the last 168 hours. No results for input(s): AMMONIA in the last 168 hours. CBC:  Recent Labs Lab 09/05/16 0319 09/06/16 0255 09/07/16 0302  WBC 7.4 9.0 10.4  HGB 10.0* 10.6* 10.8*  HCT 29.2* 30.6* 30.7*  MCV 97.7 98.4 98.1  PLT 136* 172 196   Cardiac Enzymes: No results for input(s): CKTOTAL, CKMB, CKMBINDEX, TROPONINI in the last 168 hours. BNP: Invalid input(s): POCBNP CBG:  Recent Labs Lab 09/10/16 0800 09/10/16 1149 09/10/16 1721 09/10/16 2127 09/11/16 0751  GLUCAP 123* 137* 94 137* 94   D-Dimer No results for input(s): DDIMER in the last 72 hours. Hgb A1c No results for input(s): HGBA1C in the last 72 hours. Lipid Profile No results for input(s): CHOL, HDL, LDLCALC, TRIG, CHOLHDL, LDLDIRECT in the last 72 hours. Thyroid function studies No results for input(s): TSH, T4TOTAL, T3FREE, THYROIDAB in the last 72 hours.  Invalid input(s): FREET3 Anemia work up No results for input(s): VITAMINB12, FOLATE, FERRITIN, TIBC, IRON, RETICCTPCT in the last 72 hours. Urinalysis    Component Value Date/Time   COLORURINE YELLOW 02/18/2016 [place] 02/18/2016 1733   LABSPEC 1.005 02/18/2016 1733   PHURINE 6.0 02/18/2016 1733   GLUCOSEU NEGATIVE 02/18/2016 1733   GLUCOSEU NEGATIVE 10/21/2015 0903   HGBUR NEGATIVE 02/18/2016 1733   BILIRUBINUR NEGATIVE 02/18/2016 1733   KETONESUR 5 (A) 02/18/2016 1733   PROTEINUR NEGATIVE 02/18/2016 1733   UROBILINOGEN 0.2 10/21/2015 0903   NITRITE NEGATIVE 02/18/2016 1733   LEUKOCYTESUR NEGATIVE 02/18/2016 1733   Sepsis Labs Invalid input(s): PROCALCITONIN,  WBC,  LACTICIDVEN Microbiology Recent Results (from the past 240 hour(s))  MRSA PCR Screening     Status: None   Collection Time: 09/05/16 10:12  AM  Result Value Ref Range Status   MRSA by PCR  NEGATIVE NEGATIVE Final    Comment:        The GeneXpert MRSA Assay (FDA approved for NASAL specimens only), is one component of a comprehensive MRSA colonization surveillance program. It is not intended to diagnose MRSA infection nor to guide or monitor treatment for MRSA infections.      Time coordinating discharge: Over 30 minutes  SIGNED:   Elmarie Shiley, MD  [facility] 09/11/2016, 10:16 AM Pager [phone]  If 7PM-7AM, please contact night-coverage [email or web address] Password [email or web address]

## 2016-09-11 NOTE — Care Management Note (Signed)
Case Management Note  Patient Details  Name: Derrick Fry MRN: [id number] Date of Birth: 1927/06/01  Subjective/Objective:    Pt admitted with Aortic dissection                 Action/Plan: Plan to discharge to SNF, CSW following.    Expected Discharge Date:  09/11/16               Expected Discharge Plan:  Skilled Nursing Facility  In-House Referral:  Clinical Social Work  Discharge planning Services  CM Consult  Post Acute Care Choice:    Choice offered to:     DME Arranged:    DME Agency:     HH Arranged:    [place] Agency:     Status of Service:  Completed, signed off  If discussed at [employer], dates discussed:    Additional CommentsPurcell Mouton, RN 09/11/2016, 12:14 PM

## 2016-09-12 ENCOUNTER — Non-Acute Institutional Stay (SKILLED_NURSING_FACILITY): Payer: PPO | Admitting: Internal Medicine

## 2016-09-12 ENCOUNTER — Encounter: Payer: Self-pay | Admitting: Internal Medicine

## 2016-09-12 DIAGNOSIS — I110 Hypertensive heart disease with heart failure: Secondary | ICD-10-CM

## 2016-09-12 DIAGNOSIS — Z86718 Personal history of other venous thrombosis and embolism: Secondary | ICD-10-CM | POA: Diagnosis not present

## 2016-09-12 DIAGNOSIS — N183 Chronic kidney disease, stage 3 (moderate): Secondary | ICD-10-CM | POA: Diagnosis not present

## 2016-09-12 DIAGNOSIS — E871 Hypo-osmolality and hyponatremia: Secondary | ICD-10-CM | POA: Diagnosis not present

## 2016-09-12 DIAGNOSIS — I255 Ischemic cardiomyopathy: Secondary | ICD-10-CM

## 2016-09-12 DIAGNOSIS — E785 Hyperlipidemia, unspecified: Secondary | ICD-10-CM | POA: Diagnosis not present

## 2016-09-12 DIAGNOSIS — I714 Abdominal aortic aneurysm, without rupture, unspecified: Secondary | ICD-10-CM

## 2016-09-12 DIAGNOSIS — N179 Acute kidney failure, unspecified: Secondary | ICD-10-CM

## 2016-09-12 DIAGNOSIS — M1A49X0 Other secondary chronic gout, multiple sites, without tophus (tophi): Secondary | ICD-10-CM | POA: Diagnosis not present

## 2016-09-12 DIAGNOSIS — I7102 Dissection of abdominal aorta: Secondary | ICD-10-CM | POA: Diagnosis not present

## 2016-09-12 DIAGNOSIS — E1122 Type 2 diabetes mellitus with diabetic chronic kidney disease: Secondary | ICD-10-CM

## 2016-09-12 DIAGNOSIS — E875 Hyperkalemia: Secondary | ICD-10-CM | POA: Diagnosis not present

## 2016-09-12 NOTE — Progress Notes (Signed)
: Provider:  Noah Delaine. Sheppard Coil, MD Location:  [facility] Room Number: [place]:  SNF ([phone])  PCP: Colon Branch, MD Patient Care Team: Colon Branch, MD as PCP - General Nahser, Wonda Cheng, MD as Consulting Physician (Cardiology) Druscilla Brownie, MD as Consulting Physician (Dermatology) Hennie Duos, MD as Consulting Physician (Rheumatology) Prudencio Pair as Physician Assistant (Rheumatology) Rutherford Guys, MD as Consulting Physician (Ophthalmology) Milus Banister, MD as Attending Physician (Gastroenterology)  Extended Emergency Contact Information Primary Emergency Contact: Holleman,Rebecca Address: [place], [place] Phone: [phone] Mobile Phone: [phone] Relation: Spouse Secondary Emergency Contact: Lewis,Cheryl  [place] Mobile Phone: [phone] Relation: Daughter     Allergies: Colchicine  Chief Complaint  Patient presents with  . New Admit To SNF    following hospitalization 09/03/16 to 09/11/16 aortic dissection, decreased cardiac ejection fraction    HPI: Patient is 83 y.o. male with With history of gout, left lower extremity DVT and PE, on xarelto, and known abdominal aortic aneurysm who presented to the emergency department with 1 week of back pain radiating to his shoulder blades and between his shoulder blades. Patient had been taking pain medicine for a week, the pain gets better with the medication and worse when it wears off. Patient denies fever, chills, nausea, vomiting, shortness of breath, abdominal pain, urinary or bowel symptoms, pedal edema, loss of sensation or weakness or rash or visual problems. Patient had a CT angiogram which was concerning for a possible small area of dissection or penetrating ulcer. Patient was admitted to [facility] ROM 7/1-9 for acute aortic dissection. The dissection did not progress, patient was  not felt to be a surgical candidate, and best treatment would be control of blood pressure. Hospital course was complicated by acute kidney injury, hyponatremia, and hyperkalemia. Patient is admitted to skilled nursing facility with generalized weakness for OT/PT. While at skilled nursing facility patient will be followed for hypertension treated with hydralazine and Isordil, metoprolol, hyperlipidemia treated with Lipitor and omega-3 fatty acids, and gout of knee treated with chronic prednisone and Uloric.  Past Medical History:  Diagnosis Date  .  peripheral neuropathy --UDS--pain mngmt  09/05/2006   Qualifier: Diagnosis of  By: Larose Kells MD, [place] AAA (abdominal aortic aneurysm) ([place])   . Anemia   . Aortic dissection ([place]) 09/03/2016  . Arrhythmia 02/09/2016  . Arthritis    "all over" (02/10/2016)  . Basal cell carcinoma of face    "burned off" (02/10/2016)  . CAD (coronary artery disease)    MI 08-04-85  . Chronic lower back pain   . Decreased cardiac ejection fraction 09/04/2016   EF 35%  . Diabetes mellitus with neuropathy ([place])    "borderline" (02/10/2016)  . DM II (diabetes mellitus, type II), controlled ([place]) 09/05/2006   Qualifier: Diagnosis of  By: Larose Kells MD, [place] DVT (deep venous thrombosis) ([place]) 02/2016   "left thigh"  . Essential hypertension 09/05/2006   Qualifier: Diagnosis of  By: Larose Kells MD, [place] Gait abnormality    chronic imbalance  . GAIT DISTURBANCE 08/22/2007   Qualifier: Diagnosis of  By: Larose Kells MD, [place] Gout    diskitis 10/2008, Dr Ouida Sills  . Gout 08/22/2007   Gout diagnosed 10-2008. he had a  L4 L5-S1 gout diskitis  DEXA neg 3-09 and 08-2009   . Heart murmur dx'd 02/2016  . High cholesterol 03/23/2009   Qualifier: Diagnosis of  By: Larose Kells MD, [place] History of DVT (deep vein thrombosis) 02/09/2016  . Hyperlipidemia   . Hypertension   . Myocardial infarction ([place]) 1987   "before OHS"  . OSA (obstructive sleep apnea)    Limited CPAP tolerance (02/10/2016)    . Osteoarthritis   . Pneumonia 1938   had right pneumonia pleurisy requiring resection of ribs and chest tube drainage at age 48  . Pulmonary embolism ([place]) 02/2016   "left"  . Pulmonary fibrosis ([place]) 01/29/2013  . Renal insufficiency    chronic w/ solitary kidney, congenital  . RENAL INSUFFICIENCY, CHRONIC 09/05/2006   Qualifier: Diagnosis of  By: Larose Kells MD, [place] SLEEP APNEA 09/05/2006   History of a sleep study in 2006 by Dr. Annamaria Boots    . Small bowel obstruction ([place]) 04/01/2012  . SOLITARY KIDNEY, CONGENITAL 09/05/2006   Qualifier: Diagnosis of  By: Larose Kells MD, [place]     Past Surgical History:  Procedure Laterality Date  . ABDOMINAL AORTIC ANEURYSM REPAIR  ~ 1996   w/ iliac aneurysm repair i  . APPENDECTOMY    . CARDIAC CATHETERIZATION  1987   "before OHS"  . CARDIOVASCULAR STRESS TEST  10/13/2009   EF 57%  . CATARACT EXTRACTION W/ INTRAOCULAR LENS  IMPLANT, BILATERAL  09/1999,04/2003   right,left  . COLONOSCOPY    . CORONARY ARTERY BYPASS GRAFT  01/04/1986   CABG X5  . ESOPHAGOGASTRODUODENOSCOPY (EGD) WITH PROPOFOL N/A 02/22/2016   Procedure: ESOPHAGOGASTRODUODENOSCOPY (EGD) WITH PROPOFOL;  Surgeon: Milus Banister, MD;  Location: [facility];  Service: Endoscopy;  Laterality: N/A;  . INGUINAL HERNIA REPAIR Left 03/07/1983  . JOINT REPLACEMENT    . Knuckles replaced  05/2005   left hand  . NEPHRECTOMY Left 1996  . TONSILLECTOMY    . TOTAL KNEE ARTHROPLASTY Left 05/04/1989  . US ECHOCARDIOGRAPHY  01/14/2007   EF 55-60%    Allergies as of 09/12/2016      Reactions   Colchicine    Unknown       Medication List       Accurate as of 09/12/16 10:38 AM. Always use your most recent med list.          ALPRAZolam 0.25 MG tablet Commonly known as:  XANAX Take 1 tablet (0.25 mg total) by mouth 2 (two) times daily as needed for anxiety.   atorvastatin 80 MG tablet Commonly known as:  LIPITOR Take 1 tablet (80 mg total) by mouth daily.   CENTRUM SILVER tablet Take 1  tablet by mouth daily.   cyanocobalamin 100 MCG tablet Take 100 mcg by mouth daily.   fish oil-omega-3 fatty acids 1000 MG capsule Take 2 g by mouth daily.   Flaxseed Oil 1000 MG Caps Take 1,000 mg by mouth 2 (two) times daily.   folic acid 1 MG tablet Commonly known as:  FOLVITE Take 1 mg by mouth daily.   gabapentin 400 MG capsule Commonly known as:  NEURONTIN Take 2 capsules (800 mg total) by mouth 3 (three) times daily.   glucosamine-chondroitin 500-400 MG tablet Take 1 tablet by mouth daily.   glucose blood test strip Commonly known as:  ONE TOUCH ULTRA TEST Check blood sugar no more than twice daily.   hydrALAZINE 50 MG tablet Commonly known as:  APRESOLINE Take 1 tablet (50 mg total)  by mouth every 8 (eight) hours.   isosorbide dinitrate 10 MG tablet Commonly known as:  ISORDIL Take 1 tablet (10 mg total) by mouth 3 (three) times daily.   methotrexate 2.5 MG tablet Commonly known as:  RHEUMATREX Take 15 mg by mouth once a week. Caution:Chemotherapy. Protect from light. Every Monday   metoprolol tartrate 50 MG tablet Commonly known as:  LOPRESSOR Take 1 tablet (50 mg total) by mouth 2 (two) times daily.   nitroGLYCERIN 0.4 MG SL tablet Commonly known as:  NITROSTAT Place 1 tablet (0.4 mg total) under the tongue every 5 (five) minutes as needed for chest pain.   omeprazole 40 MG capsule Commonly known as:  PRILOSEC Take 1 capsule (40 mg total) by mouth daily.   onetouch ultrasoft lancets Check blood sugar no more than twice daily.   oxyCODONE-acetaminophen 5-325 MG tablet Commonly known as:  PERCOCET/ROXICET Take 1 tablet by mouth 4 (four) times daily as needed for severe pain.   polyethylene glycol packet Commonly known as:  MIRALAX / GLYCOLAX Take 17 g by mouth as needed for mild constipation or moderate constipation.   predniSONE 5 MG tablet Commonly known as:  DELTASONE Take 2.5 mg by mouth daily.   PROBIOTIC-10 PO Take 1 tablet by mouth  daily.   ULORIC 40 MG tablet Generic drug:  febuxostat Take 40 mg by mouth daily.       No orders of the defined types were placed in this encounter.   Immunization History  Administered Date(s) Administered  . Influenza Split 11/27/2013, 10/23/2014  . Influenza Whole 12/25/2006, 12/09/2007, 12/21/2008, 11/09/2009  . Influenza-Unspecified 01/01/2012, 12/28/2012, 12/02/2015  . Pneumococcal Conjugate-13 02/17/2014  . Pneumococcal Polysaccharide-23 03/06/2005  . Td 03/06/1996, 04/16/2008  . Zoster 01/29/2010    Social History  Substance Use Topics  . Smoking status: Former Smoker    Packs/day: 1.00    Years: 30.00    Types: Cigarettes    Quit date: 04/02/1972  . Smokeless tobacco: Never Used  . Alcohol use No     Comment: former heavy alcohol use    Family history is   Family History  Problem Relation Age of Onset  . Heart disease Father   . Lymphoma Sister   . Liver cancer Brother   . Lung cancer Brother   . Ulcerative colitis Brother   . Heart disease Brother   . Diabetes Brother   . Colon cancer Neg Hx   . Prostate cancer Neg Hx   . Stomach cancer Neg Hx   . Esophageal cancer Neg Hx   . Rectal cancer Neg Hx       Review of Systems  DATA OBTAINED: from patient, nurse GENERAL:  no fevers, fatigue, appetite changes SKIN: No itching, or rash EYES: No eye pain, redness, discharge EARS: No earache, tinnitus, change in hearing NOSE: No congestion, drainage or bleeding  MOUTH/THROAT: No mouth or tooth pain, No sore throat RESPIRATORY: No cough, wheezing, SOB CARDIAC: No chest pain, palpitations, lower extremity edema  GI: No abdominal pain, No N/V/D or constipation, No heartburn or reflux  GU: No dysuria, frequency or urgency, or incontinence  MUSCULOSKELETAL: No unrelieved bone/joint pain NEUROLOGIC: No headache, dizziness or focal weakness PSYCHIATRIC: No c/o anxiety or sadness   Vitals:   09/12/16 1029  BP: (!) 170/76  Pulse: 70  Resp: 15  Temp:  98.5 F (36.9 C)    SpO2 Readings from Last 1 Encounters:  09/12/16 97%   Body mass index is 24.43 kg/m.  Physical Exam  GENERAL APPEARANCE: Alert, conversant,  No acute distress.  SKIN: No diaphoresis rash HEAD: Normocephalic, atraumatic  EYES: Conjunctiva/lids clear. Pupils round, reactive. EOMs intact.  EARS: External exam WNL, canals clear. Hearing grossly normal.  NOSE: No deformity or discharge.  MOUTH/THROAT: Lips w/o lesions  RESPIRATORY: Breathing is even, unlabored. Lung sounds are clear   CARDIOVASCULAR: Heart RRR no murmurs, rubs or gallops. No peripheral edema.   GASTROINTESTINAL: Abdomen is soft, non-tender, not distended w/ normal bowel sounds. GENITOURINARY: Bladder non tender, not distended  MUSCULOSKELETAL: No abnormal joints or musculature NEUROLOGIC:  Cranial nerves 2-12 grossly intact. Moves all extremities  PSYCHIATRIC: Mood and affect appropriate to situation, no behavioral issues  Patient Active Problem List   Diagnosis Date Noted  . Decreased cardiac ejection fraction   . Aortic dissection ([place]) 09/03/2016  . Acute bilateral thoracic back pain   . Abdominal pain, chronic, epigastric   . Gastritis and gastroduodenitis   . Diarrhea   . Pulmonary embolus ([place])   . Pulmonary embolism ([place]) 02/09/2016  . History of DVT (deep vein thrombosis) 02/09/2016  . Nausea, vomiting and diarrhea 02/09/2016  . Failure to thrive in adult 02/09/2016  . Hyponatremia 02/09/2016  . Loss of weight 02/09/2016  . Protein calorie malnutrition ([place]) 02/09/2016  . Arrhythmia 02/09/2016  . PCP NOTES >>>>> 11/20/2014  . Buzzing in ear 06/04/2013  . Pulmonary fibrosis ([place]) 01/29/2013  . Annual physical exam 07/11/2010  . AAA (abdominal aortic aneurysm) ([place]) 07/11/2010  . High cholesterol 03/23/2009  . Gout 08/22/2007  . GAIT DISTURBANCE 08/22/2007  . Osteoarthritis  04/23/2007  . OSA (obstructive sleep apnea) 01/03/2007  . DM II (diabetes mellitus, type II),  controlled ([place]) 09/05/2006  .  peripheral neuropathy --UDS--pain mngmt  09/05/2006  . Essential hypertension 09/05/2006  . CAD (coronary artery disease) 09/05/2006  . RENAL INSUFFICIENCY, CHRONIC 09/05/2006  . SOLITARY KIDNEY, CONGENITAL 09/05/2006  . SLEEP APNEA 09/05/2006      Labs reviewed: Basic Metabolic Panel:    Component Value Date/Time   NA 132 (L) 09/11/2016 0409   NA 136 (A) 02/02/2016   K 4.8 09/11/2016 0409   CL 102 09/11/2016 0409   CO2 26 09/11/2016 0409   GLUCOSE 109 (H) 09/11/2016 0409   GLUCOSE 113 02/22/2009   BUN 45 (H) 09/11/2016 0409   BUN 23 (A) 02/02/2016   CREATININE 1.25 (H) 09/11/2016 0409   CREATININE 1.47 (H) 08/03/2015 0835   CALCIUM 9.2 09/11/2016 0409   PROT 6.1 (L) 09/03/2016 1126   ALBUMIN 2.3 (L) 09/03/2016 1126   AST 138 (H) 09/03/2016 1126   ALT 117 (H) 09/03/2016 1126   ALKPHOS 88 09/03/2016 1126   BILITOT 0.7 09/03/2016 1126   GFRNONAA 50 (L) 09/11/2016 0409   GFRAA 58 (L) 09/11/2016 0409     Recent Labs  02/18/16 1740  09/04/16 0350 09/05/16 0319  09/08/16 0313 09/10/16 1558 09/11/16 0409  NA 133*  < > 134* 130*  < > 130* 132* 132*  K 3.6  < > 4.7 4.3  < > 4.7 4.7 4.8  CL 99*  < > 100* 97*  < > 100* 101 102  CO2 25  < > 25 24  < > 23 26 26   GLUCOSE 116*  < > 117* 173*  < > 208* 105* 109*  BUN 30*  < > 42* 55*  < > 57* 43* 45*  CREATININE 1.48*  < > 1.54* 1.78*  < > 1.36* 1.23 1.25*  CALCIUM 8.7*  < >  8.9 8.8*  < > 9.4 9.7 9.2  MG 1.9  --  2.1 2.3  --   --   --   --   PHOS  --   --  4.2 3.9  --   --   --   --   < > = values in this interval not displayed. Liver Function Tests:  Recent Labs  02/18/16 1740 08/29/16 1100 09/03/16 1126  AST 55* 45* 138*  ALT 34 32 117*  ALKPHOS 61 69 88  BILITOT 1.0 0.8 0.7  PROT 6.0* 6.4 6.1*  ALBUMIN 2.8* 3.5 2.3*    Recent Labs  02/09/16 1211 02/18/16 1740  LIPASE 23 41   No results for input(s): AMMONIA in the last 8760 hours. CBC:  Recent Labs  05/22/16 1356  08/29/16 1100 09/03/16 1126  09/05/16 0319 09/06/16 0255 09/07/16 0302  WBC 8.4 11.0* 6.6  < > 7.4 9.0 10.4  NEUTROABS 5.9 9.4* 5.7  --   --   --   --   HGB 11.7* 12.6* 10.9*  < > 10.0* 10.6* 10.8*  HCT 35.0* 37.3* 31.8*  < > 29.2* 30.6* 30.7*  MCV 100.8* 104.6* 100.0  < > 97.7 98.4 98.1  PLT 162.0 141.0* 122*  < > 136* 172 196  < > = values in this interval not displayed. Lipid No results for input(s): CHOL, HDL, LDLCALC, TRIG in the last 8760 hours.  Cardiac Enzymes: No results for input(s): CKTOTAL, CKMB, CKMBINDEX, TROPONINI in the last 8760 hours. BNP: No results for input(s): BNP in the last 8760 hours. Lab Results  Component Value Date   MICROALBUR 0.7 04/22/2015   Lab Results  Component Value Date   HGBA1C 6.7 (H) 08/29/2016   Lab Results  Component Value Date   TSH 1.36 08/29/2016   Lab Results  Component Value Date   EUMPNTIR44 315 (H) 02/09/2016   Lab Results  Component Value Date   FOLATE >23.4 02/07/2016   Lab Results  Component Value Date   IRON 46 05/22/2016   FERRITIN 202.0 05/22/2016    Imaging and Procedures obtained prior to SNF admission: Dg Chest 2 View  Result Date: 09/03/2016 CLINICAL DATA:  Back pain EXAM: CHEST  2 VIEW COMPARISON:  08/29/2016 FINDINGS: Prior CABG. Heart is borderline in size. Mild peribronchial thickening and interstitial prominence. Scarring in the left base. No acute opacities or effusions. No acute bony abnormality. IMPRESSION: Chronic bronchitic changes. Left basilar scarring. No active disease. Electronically Signed   By: Rolm Baptise M.D.   On: 09/03/2016 12:02   Ct Angio Chest Pe W And/or Wo Contrast  Result Date: 09/03/2016 CLINICAL DATA:  Upper back pain. Clinical concern for pulmonary embolus versus dissection. EXAM: CT ANGIOGRAPHY CHEST WITH CONTRAST TECHNIQUE: Multidetector CT imaging of the chest was performed using the standard protocol during bolus administration of intravenous contrast. Multiplanar CT image  reconstructions and MIPs were obtained to evaluate the vascular anatomy. CONTRAST:  80 cc Isovue 370 intravenously. COMPARISON:  None. FINDINGS: Cardiovascular: Satisfactory opacification of the pulmonary arteries to the segmental level. No evidence of pulmonary embolism. Enlarged heart. No pericardial effusion. Heavy calcific atherosclerotic disease of the coronary arteries. Postsurgical changes from CABG. The thoracic aorta is very tortuous, with an ectatic portion of the descending thoracic aorta measuring 3.8 cm in greatest diameter. Heavy calcified and noncalcified plaque is seen along the aorta with asymmetric noncalcified plaque versus blood clot along the posterior wall of the descending aorta. There is a small amount of  high density material along the medial wall of the descending aorta, image 73/sequence 10 extending inferiorly to the level of the mid thorax. Low fluid density material is seen along the posterior wall of the aorta in the distal thorax. Mediastinum/Nodes: No enlarged mediastinal, hilar, or axillary lymph nodes. Thyroid gland, trachea, and esophagus demonstrate no significant findings. Lungs/Pleura: Low lung volumes with mild interstitial lung changes and hypoventilatory changes in the lower lobes. Upper Abdomen: No acute abnormality. Musculoskeletal: No chest wall abnormality. No acute or significant osseous findings. Review of the MIP images confirms the above findings. IMPRESSION: Ectatic aorta with eccentric calcified and noncalcified plaque versus intramural blood clot, especially along the posterior wall of the descending aorta. High density material seen along the medial wall of the descending aorta, concerning for acute aortic syndrome, possibly small area of dissection or penetrating ulcer, which is not well visualized on this study, targeting better visualization of the pulmonary arteries. Surgical consultation is recommended. CT angiogram with and without contrast to better  visualize the thoracic aorta may be considered if found clinically feasible. Aortic Atherosclerosis (ICD10-I70.0). No evidence of pulmonary embolus. Enlarged heart with heavily calcified coronary arteries. These results were called by telephone at the time of interpretation on 09/03/2016 at 2:16 pm to Dr. Davonna Belling , who verbally acknowledged these results. Electronically Signed   By: Fidela Salisbury M.D.   On: 09/03/2016 14:22   Dg Chest Port 1 View  Result Date: 09/04/2016 CLINICAL DATA:  Aortic dissection. EXAM: PORTABLE CHEST 1 VIEW COMPARISON:  CT 09/03/2016.  Chest x-ray 09/03/2016. FINDINGS: Prior CABG. Heart size stable. Tortuous thoracic aorta again noted, reference made to prior CT report 09/03/2016. Heart size stable. Low lung volumes with basilar atelectasis. No pleural effusion or pneumothorax . IMPRESSION: 1. Prior CABG. Heart size stable. Tortuous thoracic aorta again noted, reference made to prior CT report 09/03/2016. 2.  Bibasilar subsegmental atelectasis. Electronically Signed   By: Marcello Moores  Register   On: 09/04/2016 06:48     Not all labs, radiology exams or other studies done during hospitalization come through on my EPIC note; however they are reviewed by me.    Assessment and Plan  ABDOMINAL AORTIC DISSECTION-MRA-possible dissection thrombus stable spoke with cardiothoracic thoracic surgery-no stent; control would be blood pressure control SNF - continue metoprolol 50 mg twice a day; patient was EC on hydralazine 50 mg every 8 but his blood pressure is elevated here so will increase hydralazine to 10 mg every 8 hours  HYPERTENSION SNF - now well controlled continue metoprolol 50 twice a day Imdur or 10 mg 3 times a day and will increase hydralazine 50 every 8-100 mg every 8 hours  CARDIOMYOPATHY/AKI-ejection fraction 35% by echo; cardiology following; no ACE due to AKI; may wish to consider a ACE 1 renal function is stable SNF -continue metoprolol 50 mg twice a day;  considerACE if improved renal function; will follow-up BMP  Hyperlipidemia SNF - not stated as uncontrolled continue Lipitor 80 mg daily and omega-3 1 g daily  DM 2-patient was on Lantus prior but in the hospital his CBGs were 100 in the 100s SNF -we'll follow CBGs and restart Lantus if needed  GOUT-on chronic prednisone SNF - continue prednisone 2.5 mg daily  HYPONATREMIA/HYPERKALEMIA-resolved SNF -follow-up BMP  HISTORY of DVT AND PE SNF -off xarelto now   Time spent > 45 min;> 50% of time with patient was spent reviewing records, labs, tests and studies, counseling and developing plan of care  Webb Silversmith D. Sheppard Coil,  MD

## 2016-09-13 ENCOUNTER — Encounter: Payer: Self-pay | Admitting: Internal Medicine

## 2016-09-13 DIAGNOSIS — N179 Acute kidney failure, unspecified: Secondary | ICD-10-CM | POA: Insufficient documentation

## 2016-09-13 DIAGNOSIS — I429 Cardiomyopathy, unspecified: Secondary | ICD-10-CM | POA: Insufficient documentation

## 2016-09-13 DIAGNOSIS — E875 Hyperkalemia: Secondary | ICD-10-CM | POA: Insufficient documentation

## 2016-09-17 ENCOUNTER — Inpatient Hospital Stay (HOSPITAL_COMMUNITY)
Admission: EM | Admit: 2016-09-17 | Discharge: 2016-09-28 | DRG: 853 | Disposition: A | Discharge status: Skilled Nursing Facility | Payer: PPO | Attending: Internal Medicine | Admitting: Internal Medicine

## 2016-09-17 ENCOUNTER — Emergency Department (HOSPITAL_COMMUNITY): Payer: PPO

## 2016-09-17 ENCOUNTER — Encounter (HOSPITAL_COMMUNITY): Payer: Self-pay | Admitting: Emergency Medicine

## 2016-09-17 DIAGNOSIS — E785 Hyperlipidemia, unspecified: Secondary | ICD-10-CM | POA: Diagnosis not present

## 2016-09-17 DIAGNOSIS — G9341 Metabolic encephalopathy: Secondary | ICD-10-CM

## 2016-09-17 DIAGNOSIS — E1165 Type 2 diabetes mellitus with hyperglycemia: Secondary | ICD-10-CM | POA: Diagnosis present

## 2016-09-17 DIAGNOSIS — Z86718 Personal history of other venous thrombosis and embolism: Secondary | ICD-10-CM

## 2016-09-17 DIAGNOSIS — Z951 Presence of aortocoronary bypass graft: Secondary | ICD-10-CM | POA: Diagnosis not present

## 2016-09-17 DIAGNOSIS — E871 Hypo-osmolality and hyponatremia: Secondary | ICD-10-CM | POA: Diagnosis not present

## 2016-09-17 DIAGNOSIS — E1151 Type 2 diabetes mellitus with diabetic peripheral angiopathy without gangrene: Secondary | ICD-10-CM | POA: Diagnosis present

## 2016-09-17 DIAGNOSIS — N183 Chronic kidney disease, stage 3 unspecified: Secondary | ICD-10-CM | POA: Diagnosis present

## 2016-09-17 DIAGNOSIS — J329 Chronic sinusitis, unspecified: Secondary | ICD-10-CM | POA: Diagnosis not present

## 2016-09-17 DIAGNOSIS — I5022 Chronic systolic (congestive) heart failure: Secondary | ICD-10-CM | POA: Diagnosis present

## 2016-09-17 DIAGNOSIS — N390 Urinary tract infection, site not specified: Secondary | ICD-10-CM | POA: Diagnosis not present

## 2016-09-17 DIAGNOSIS — B488 Other specified mycoses: Secondary | ICD-10-CM | POA: Diagnosis not present

## 2016-09-17 DIAGNOSIS — R4701 Aphasia: Secondary | ICD-10-CM | POA: Diagnosis not present

## 2016-09-17 DIAGNOSIS — G92 Toxic encephalopathy: Secondary | ICD-10-CM | POA: Diagnosis not present

## 2016-09-17 DIAGNOSIS — Z7952 Long term (current) use of systemic steroids: Secondary | ICD-10-CM | POA: Diagnosis not present

## 2016-09-17 DIAGNOSIS — E875 Hyperkalemia: Secondary | ICD-10-CM | POA: Diagnosis present

## 2016-09-17 DIAGNOSIS — R509 Fever, unspecified: Secondary | ICD-10-CM | POA: Diagnosis not present

## 2016-09-17 DIAGNOSIS — Z8679 Personal history of other diseases of the circulatory system: Secondary | ICD-10-CM

## 2016-09-17 DIAGNOSIS — E7800 Pure hypercholesterolemia, unspecified: Secondary | ICD-10-CM | POA: Diagnosis present

## 2016-09-17 DIAGNOSIS — T380X5A Adverse effect of glucocorticoids and synthetic analogues, initial encounter: Secondary | ICD-10-CM | POA: Diagnosis present

## 2016-09-17 DIAGNOSIS — R011 Cardiac murmur, unspecified: Secondary | ICD-10-CM | POA: Diagnosis present

## 2016-09-17 DIAGNOSIS — N179 Acute kidney failure, unspecified: Secondary | ICD-10-CM | POA: Diagnosis not present

## 2016-09-17 DIAGNOSIS — A819 Atypical virus infection of central nervous system, unspecified: Secondary | ICD-10-CM | POA: Diagnosis not present

## 2016-09-17 DIAGNOSIS — Z905 Acquired absence of kidney: Secondary | ICD-10-CM

## 2016-09-17 DIAGNOSIS — E1140 Type 2 diabetes mellitus with diabetic neuropathy, unspecified: Secondary | ICD-10-CM | POA: Diagnosis present

## 2016-09-17 DIAGNOSIS — D649 Anemia, unspecified: Secondary | ICD-10-CM | POA: Diagnosis not present

## 2016-09-17 DIAGNOSIS — Z452 Encounter for adjustment and management of vascular access device: Secondary | ICD-10-CM | POA: Diagnosis not present

## 2016-09-17 DIAGNOSIS — I714 Abdominal aortic aneurysm, without rupture, unspecified: Secondary | ICD-10-CM | POA: Diagnosis present

## 2016-09-17 DIAGNOSIS — E1122 Type 2 diabetes mellitus with diabetic chronic kidney disease: Secondary | ICD-10-CM | POA: Diagnosis not present

## 2016-09-17 DIAGNOSIS — R05 Cough: Secondary | ICD-10-CM | POA: Diagnosis not present

## 2016-09-17 DIAGNOSIS — I2510 Atherosclerotic heart disease of native coronary artery without angina pectoris: Secondary | ICD-10-CM | POA: Diagnosis not present

## 2016-09-17 DIAGNOSIS — R609 Edema, unspecified: Secondary | ICD-10-CM | POA: Diagnosis not present

## 2016-09-17 DIAGNOSIS — Z6824 Body mass index (BMI) 24.0-24.9, adult: Secondary | ICD-10-CM

## 2016-09-17 DIAGNOSIS — M1990 Unspecified osteoarthritis, unspecified site: Secondary | ICD-10-CM | POA: Diagnosis present

## 2016-09-17 DIAGNOSIS — Z8249 Family history of ischemic heart disease and other diseases of the circulatory system: Secondary | ICD-10-CM

## 2016-09-17 DIAGNOSIS — R4182 Altered mental status, unspecified: Secondary | ICD-10-CM | POA: Diagnosis not present

## 2016-09-17 DIAGNOSIS — Z79899 Other long term (current) drug therapy: Secondary | ICD-10-CM | POA: Diagnosis not present

## 2016-09-17 DIAGNOSIS — F0390 Unspecified dementia without behavioral disturbance: Secondary | ICD-10-CM | POA: Diagnosis present

## 2016-09-17 DIAGNOSIS — I130 Hypertensive heart and chronic kidney disease with heart failure and stage 1 through stage 4 chronic kidney disease, or unspecified chronic kidney disease: Secondary | ICD-10-CM | POA: Diagnosis not present

## 2016-09-17 DIAGNOSIS — Z833 Family history of diabetes mellitus: Secondary | ICD-10-CM | POA: Diagnosis not present

## 2016-09-17 DIAGNOSIS — E872 Acidosis: Secondary | ICD-10-CM | POA: Diagnosis present

## 2016-09-17 DIAGNOSIS — Z87891 Personal history of nicotine dependence: Secondary | ICD-10-CM

## 2016-09-17 DIAGNOSIS — J8410 Pulmonary fibrosis, unspecified: Secondary | ICD-10-CM | POA: Diagnosis present

## 2016-09-17 DIAGNOSIS — F319 Bipolar disorder, unspecified: Secondary | ICD-10-CM | POA: Diagnosis present

## 2016-09-17 DIAGNOSIS — T82898D Other specified complication of vascular prosthetic devices, implants and grafts, subsequent encounter: Secondary | ICD-10-CM

## 2016-09-17 DIAGNOSIS — Z86711 Personal history of pulmonary embolism: Secondary | ICD-10-CM | POA: Diagnosis not present

## 2016-09-17 DIAGNOSIS — L4050 Arthropathic psoriasis, unspecified: Secondary | ICD-10-CM | POA: Diagnosis present

## 2016-09-17 DIAGNOSIS — Z801 Family history of malignant neoplasm of trachea, bronchus and lung: Secondary | ICD-10-CM | POA: Diagnosis not present

## 2016-09-17 DIAGNOSIS — B4489 Other forms of aspergillosis: Secondary | ICD-10-CM | POA: Diagnosis present

## 2016-09-17 DIAGNOSIS — B370 Candidal stomatitis: Secondary | ICD-10-CM | POA: Diagnosis present

## 2016-09-17 DIAGNOSIS — Z888 Allergy status to other drugs, medicaments and biological substances status: Secondary | ICD-10-CM

## 2016-09-17 DIAGNOSIS — N3000 Acute cystitis without hematuria: Secondary | ICD-10-CM | POA: Diagnosis not present

## 2016-09-17 DIAGNOSIS — B999 Unspecified infectious disease: Secondary | ICD-10-CM | POA: Diagnosis not present

## 2016-09-17 DIAGNOSIS — B49 Unspecified mycosis: Secondary | ICD-10-CM | POA: Diagnosis not present

## 2016-09-17 DIAGNOSIS — G8929 Other chronic pain: Secondary | ICD-10-CM | POA: Diagnosis present

## 2016-09-17 DIAGNOSIS — G4733 Obstructive sleep apnea (adult) (pediatric): Secondary | ICD-10-CM | POA: Diagnosis present

## 2016-09-17 DIAGNOSIS — J320 Chronic maxillary sinusitis: Secondary | ICD-10-CM | POA: Diagnosis present

## 2016-09-17 DIAGNOSIS — I252 Old myocardial infarction: Secondary | ICD-10-CM

## 2016-09-17 DIAGNOSIS — Z800 Family history of malignant neoplasm of digestive organs: Secondary | ICD-10-CM | POA: Diagnosis not present

## 2016-09-17 DIAGNOSIS — M6281 Muscle weakness (generalized): Secondary | ICD-10-CM | POA: Diagnosis not present

## 2016-09-17 DIAGNOSIS — G060 Intracranial abscess and granuloma: Secondary | ICD-10-CM

## 2016-09-17 DIAGNOSIS — E876 Hypokalemia: Secondary | ICD-10-CM | POA: Diagnosis not present

## 2016-09-17 DIAGNOSIS — G9340 Encephalopathy, unspecified: Secondary | ICD-10-CM | POA: Diagnosis not present

## 2016-09-17 DIAGNOSIS — Z96652 Presence of left artificial knee joint: Secondary | ICD-10-CM | POA: Diagnosis present

## 2016-09-17 DIAGNOSIS — M109 Gout, unspecified: Secondary | ICD-10-CM | POA: Diagnosis present

## 2016-09-17 DIAGNOSIS — I255 Ischemic cardiomyopathy: Secondary | ICD-10-CM | POA: Diagnosis present

## 2016-09-17 DIAGNOSIS — M549 Dorsalgia, unspecified: Secondary | ICD-10-CM | POA: Diagnosis not present

## 2016-09-17 DIAGNOSIS — E46 Unspecified protein-calorie malnutrition: Secondary | ICD-10-CM | POA: Diagnosis present

## 2016-09-17 DIAGNOSIS — M545 Low back pain: Secondary | ICD-10-CM | POA: Diagnosis present

## 2016-09-17 DIAGNOSIS — E119 Type 2 diabetes mellitus without complications: Secondary | ICD-10-CM

## 2016-09-17 DIAGNOSIS — Z807 Family history of other malignant neoplasms of lymphoid, hematopoietic and related tissues: Secondary | ICD-10-CM

## 2016-09-17 DIAGNOSIS — D696 Thrombocytopenia, unspecified: Secondary | ICD-10-CM | POA: Diagnosis present

## 2016-09-17 DIAGNOSIS — Z9841 Cataract extraction status, right eye: Secondary | ICD-10-CM

## 2016-09-17 DIAGNOSIS — R1312 Dysphagia, oropharyngeal phase: Secondary | ICD-10-CM | POA: Diagnosis not present

## 2016-09-17 DIAGNOSIS — I5043 Acute on chronic combined systolic (congestive) and diastolic (congestive) heart failure: Secondary | ICD-10-CM | POA: Diagnosis present

## 2016-09-17 DIAGNOSIS — I5023 Acute on chronic systolic (congestive) heart failure: Secondary | ICD-10-CM | POA: Diagnosis not present

## 2016-09-17 DIAGNOSIS — Z961 Presence of intraocular lens: Secondary | ICD-10-CM | POA: Diagnosis present

## 2016-09-17 DIAGNOSIS — Z66 Do not resuscitate: Secondary | ICD-10-CM | POA: Diagnosis present

## 2016-09-17 DIAGNOSIS — A4189 Other specified sepsis: Principal | ICD-10-CM | POA: Diagnosis present

## 2016-09-17 DIAGNOSIS — R269 Unspecified abnormalities of gait and mobility: Secondary | ICD-10-CM | POA: Diagnosis present

## 2016-09-17 DIAGNOSIS — Z9842 Cataract extraction status, left eye: Secondary | ICD-10-CM

## 2016-09-17 DIAGNOSIS — J322 Chronic ethmoidal sinusitis: Secondary | ICD-10-CM | POA: Diagnosis not present

## 2016-09-17 LAB — CBC WITH DIFFERENTIAL/PLATELET
BASOS ABS: 0 10*3/uL (ref 0.0–0.1)
Basophils Relative: 0 %
EOS ABS: 0 10*3/uL (ref 0.0–0.7)
EOS PCT: 0 %
HCT: 29.2 % — ABNORMAL LOW (ref 39.0–52.0)
Hemoglobin: 9.8 g/dL — ABNORMAL LOW (ref 13.0–17.0)
LYMPHS ABS: 1.1 10*3/uL (ref 0.7–4.0)
Lymphocytes Relative: 7 %
MCH: 32.8 pg (ref 26.0–34.0)
MCHC: 33.6 g/dL (ref 30.0–36.0)
MCV: 97.7 fL (ref 78.0–100.0)
MONO ABS: 1 10*3/uL (ref 0.1–1.0)
Monocytes Relative: 7 %
Neutro Abs: 13.8 10*3/uL — ABNORMAL HIGH (ref 1.7–7.7)
Neutrophils Relative %: 86 %
PLATELETS: 176 10*3/uL (ref 150–400)
RBC: 2.99 MIL/uL — ABNORMAL LOW (ref 4.22–5.81)
RDW: 16.2 % — AB (ref 11.5–15.5)
WBC: 15.9 10*3/uL — AB (ref 4.0–10.5)

## 2016-09-17 LAB — URINALYSIS, ROUTINE W REFLEX MICROSCOPIC
BILIRUBIN URINE: NEGATIVE
Glucose, UA: NEGATIVE mg/dL
Hgb urine dipstick: NEGATIVE
Ketones, ur: 20 mg/dL — AB
Nitrite: NEGATIVE
PH: 6 (ref 5.0–8.0)
Protein, ur: 30 mg/dL — AB
SPECIFIC GRAVITY, URINE: 1.026 (ref 1.005–1.030)

## 2016-09-17 LAB — COMPREHENSIVE METABOLIC PANEL
ALT: 34 U/L (ref 17–63)
AST: 38 U/L (ref 15–41)
Albumin: 2.1 g/dL — ABNORMAL LOW (ref 3.5–5.0)
Alkaline Phosphatase: 67 U/L (ref 38–126)
Anion gap: 9 (ref 5–15)
BILIRUBIN TOTAL: 0.8 mg/dL (ref 0.3–1.2)
BUN: 24 mg/dL — AB (ref 6–20)
CO2: 21 mmol/L — ABNORMAL LOW (ref 22–32)
CREATININE: 1.18 mg/dL (ref 0.61–1.24)
Calcium: 9.1 mg/dL (ref 8.9–10.3)
Chloride: 103 mmol/L (ref 101–111)
GFR calc Af Amer: >60 mL/min (ref >60)
GFR, EST NON AFRICAN AMERICAN: 54 mL/min — AB (ref >60)
Glucose, Bld: 135 mg/dL — ABNORMAL HIGH (ref 65–99)
Potassium: 4.6 mmol/L (ref 3.5–5.1)
Sodium: 133 mmol/L — ABNORMAL LOW (ref 135–145)
TOTAL PROTEIN: 5.6 g/dL — AB (ref 6.5–8.1)

## 2016-09-17 LAB — I-STAT CG4 LACTIC ACID, ED: Lactic Acid, Venous: 0.82 mmol/L (ref 0.5–1.9)

## 2016-09-17 MED ORDER — DEXTROSE 5 % IV SOLN
2.0000 g | Freq: Once | INTRAVENOUS | Status: AC
  Administered 2016-09-17: 2 g via INTRAVENOUS
  Filled 2016-09-17: qty 2

## 2016-09-17 MED ORDER — POLYETHYLENE GLYCOL 3350 17 G PO PACK: 17.0000 g | PACK | Freq: Every day | ORAL | Status: DC | PRN

## 2016-09-17 MED ORDER — SODIUM CHLORIDE 0.9% FLUSH: 3.0000 mL | INTRAVENOUS | Status: DC | PRN

## 2016-09-17 MED ORDER — SODIUM CHLORIDE 0.9 % IV BOLUS (SEPSIS)
1000.0000 mL | Freq: Once | INTRAVENOUS | Status: AC
  Administered 2016-09-17: 1000 mL via INTRAVENOUS

## 2016-09-17 MED ORDER — SODIUM CHLORIDE 0.9 % IV BOLUS (SEPSIS)
250.0000 mL | Freq: Once | INTRAVENOUS | Status: AC
  Administered 2016-09-17: 250 mL via INTRAVENOUS

## 2016-09-17 MED ORDER — ACETAMINOPHEN 650 MG RE SUPP: 650.0000 mg | Freq: Four times a day (QID) | RECTAL | Status: DC | PRN

## 2016-09-17 MED ORDER — VANCOMYCIN HCL IN DEXTROSE 1-5 GM/200ML-% IV SOLN
1000.0000 mg | Freq: Once | INTRAVENOUS | Status: AC
  Administered 2016-09-17: 1000 mg via INTRAVENOUS
  Filled 2016-09-17: qty 200

## 2016-09-17 MED ORDER — METOPROLOL TARTRATE 50 MG PO TABS
50.0000 mg | ORAL_TABLET | Freq: Two times a day (BID) | ORAL | Status: DC
  Administered 2016-09-17: 50 mg via ORAL
  Filled 2016-09-17 (×2): qty 1

## 2016-09-17 MED ORDER — ACETAMINOPHEN 325 MG PO TABS
650.0000 mg | ORAL_TABLET | Freq: Four times a day (QID) | ORAL | Status: DC | PRN
  Administered 2016-09-17: 650 mg via ORAL
  Filled 2016-09-17: qty 2

## 2016-09-17 MED ORDER — ADULT MULTIVITAMIN W/MINERALS CH: 1.0000 | ORAL_TABLET | Freq: Every day | ORAL | Status: DC

## 2016-09-17 MED ORDER — GABAPENTIN 400 MG PO CAPS
800.0000 mg | ORAL_CAPSULE | Freq: Three times a day (TID) | ORAL | Status: DC
  Administered 2016-09-17: 800 mg via ORAL
  Filled 2016-09-17 (×2): qty 2

## 2016-09-17 MED ORDER — FOLIC ACID 1 MG PO TABS
1.0000 mg | ORAL_TABLET | Freq: Every day | ORAL | Status: DC
  Filled 2016-09-17: qty 1

## 2016-09-17 MED ORDER — ISOSORBIDE DINITRATE 10 MG PO TABS
10.0000 mg | ORAL_TABLET | Freq: Three times a day (TID) | ORAL | Status: DC
  Filled 2016-09-17 (×4): qty 1

## 2016-09-17 MED ORDER — FEBUXOSTAT 40 MG PO TABS
40.0000 mg | ORAL_TABLET | Freq: Every day | ORAL | Status: DC
  Filled 2016-09-17: qty 1

## 2016-09-17 MED ORDER — VANCOMYCIN HCL IN DEXTROSE 750-5 MG/150ML-% IV SOLN
750.0000 mg | Freq: Two times a day (BID) | INTRAVENOUS | Status: DC
  Filled 2016-09-17: qty 150

## 2016-09-17 MED ORDER — IPRATROPIUM-ALBUTEROL 0.5-2.5 (3) MG/3ML IN SOLN: 3.0000 mL | Freq: Four times a day (QID) | RESPIRATORY_TRACT | Status: DC | PRN

## 2016-09-17 MED ORDER — DEXTROSE 5 % IV SOLN
1.0000 g | Freq: Two times a day (BID) | INTRAVENOUS | Status: DC
  Filled 2016-09-17: qty 1

## 2016-09-17 MED ORDER — ATORVASTATIN CALCIUM 80 MG PO TABS
80.0000 mg | ORAL_TABLET | Freq: Every day | ORAL | Status: DC
  Filled 2016-09-17: qty 1

## 2016-09-17 MED ORDER — VITAMIN B-12 100 MCG PO TABS
100.0000 ug | ORAL_TABLET | Freq: Every day | ORAL | Status: DC
  Administered 2016-09-22 – 2016-09-28 (×7): 100 ug via ORAL
  Filled 2016-09-17 (×11): qty 1

## 2016-09-17 MED ORDER — PREDNISONE 2.5 MG PO TABS
2.5000 mg | ORAL_TABLET | Freq: Every day | ORAL | Status: DC
  Administered 2016-09-18: 2.5 mg via ORAL
  Filled 2016-09-17: qty 1

## 2016-09-17 MED ORDER — DEXTROSE 5 % IV SOLN
1.0000 g | Freq: Two times a day (BID) | INTRAVENOUS | Status: DC
  Administered 2016-09-18 (×2): 1 g via INTRAVENOUS
  Filled 2016-09-17 (×3): qty 1

## 2016-09-17 MED ORDER — OMEGA-3-ACID ETHYL ESTERS 1 G PO CAPS: 2.0000 g | ORAL_CAPSULE | Freq: Every day | ORAL | Status: DC

## 2016-09-17 MED ORDER — SODIUM CHLORIDE 0.9% FLUSH
3.0000 mL | Freq: Two times a day (BID) | INTRAVENOUS | Status: DC
  Administered 2016-09-17 – 2016-09-27 (×8): 3 mL via INTRAVENOUS

## 2016-09-17 MED ORDER — PANTOPRAZOLE SODIUM 40 MG PO TBEC: 40.0000 mg | DELAYED_RELEASE_TABLET | Freq: Every day | ORAL | Status: DC

## 2016-09-17 MED ORDER — ONDANSETRON HCL 4 MG/2ML IJ SOLN
4.0000 mg | Freq: Four times a day (QID) | INTRAMUSCULAR | Status: DC | PRN
Start: 2016-09-17 — End: 2016-09-28

## 2016-09-17 MED ORDER — ALPRAZOLAM 0.25 MG PO TABS: 0.2500 mg | ORAL_TABLET | Freq: Two times a day (BID) | ORAL | Status: DC | PRN

## 2016-09-17 MED ORDER — SACCHAROMYCES BOULARDII 250 MG PO CAPS: 250.0000 mg | ORAL_CAPSULE | Freq: Every day | ORAL | Status: DC

## 2016-09-17 MED ORDER — SODIUM CHLORIDE 0.9 % IV SOLN
1000.0000 mL | INTRAVENOUS | Status: DC
  Administered 2016-09-17: 1000 mL via INTRAVENOUS

## 2016-09-17 MED ORDER — ONDANSETRON HCL 4 MG PO TABS: 4.0000 mg | ORAL_TABLET | Freq: Four times a day (QID) | ORAL | Status: DC | PRN

## 2016-09-17 MED ORDER — SODIUM CHLORIDE 0.9 % IV SOLN: 250.0000 mL | INTRAVENOUS | Status: DC | PRN

## 2016-09-17 MED ORDER — HYDRALAZINE HCL 50 MG PO TABS
100.0000 mg | ORAL_TABLET | Freq: Three times a day (TID) | ORAL | Status: DC
  Administered 2016-09-17: 100 mg via ORAL
  Filled 2016-09-17 (×2): qty 2

## 2016-09-17 MED ORDER — LABETALOL HCL 5 MG/ML IV SOLN
5.0000 mg | INTRAVENOUS | Status: DC | PRN
  Filled 2016-09-17: qty 4

## 2016-09-17 MED ORDER — OXYCODONE-ACETAMINOPHEN 5-325 MG PO TABS: 1.0000 | ORAL_TABLET | Freq: Four times a day (QID) | ORAL | Status: DC | PRN

## 2016-09-17 NOTE — ED Triage Notes (Signed)
Per [facility] pt from [facility] facility for fever for past 2 days. Given 1g tylenol at 1700. Chest xray showed left lower lobe infiltrates.

## 2016-09-17 NOTE — H&P (Signed)
History and Physical    Derrick Fry XHB:[id number] DOB: 10-Feb-1928 DOA: 09/17/2016  PCP: Colon Branch, MD   Patient coming from: Nursing home  Chief Complaint: Fevers, increased confusion  HPI: Derrick Fry is a 83 y.o. male with medical history significant for hypertension, coronary artery disease, AAA status post repair, chronic diastolic CHF, diabetes now diet controlled, congenital solitary kidney with chronic kidney disease stage III, and recent concern for possible descending thoracic aortic ulcer, now presenting from his nursing home for evaluation of fevers and increased confusion. Patient was recently admitted to the hospital with back pain and CTA chest concerning for a possible dissection in the thoracic aorta. He was evaluated by cardiology and CTS, felt not to be a surgical candidate, and ultimately suspected of having an ulcer in his thick aortic plaque, but no dissection, and was discharged in much improved and stable condition on 09/11/2016. He was doing well back at the nursing home until yesterday when he began to develop fevers and increased confusion. He has not voiced any specific complaints. There has not been any cough or apparent dyspnea. No vomiting or diarrhea reported. Patient is unable to contribute to the history secondary to his dementia with increased confusion. History is obtained through wife at the bedside, reported nursing home personnel, discussion with ED personnel, and review of the EMR. Patient reportedly had a chest x-ray at the nursing home that was concerning for a left lower lobe pneumonia. He was treated with acetaminophen, started on Levaquin, and sent to the ED for further evaluation.  ED Course: Upon arrival to the ED, patient is found to be febrile to 39.3 C, saturating adequately on room air, and with vitals otherwise stable. EKG features a sinus rhythm with PVCs and chest x-ray is notable for stable mild cardiomegaly without overt edema, as well as  chronic bibasilar reticular opacities likely represent scarring or atelectasis. Chemistry panels notable for sodium of 133, bicarbonate of 21, and serum creatinine 1.18, better than recent priors. CBC is notable for a leukocytosis to 15,900 and a normocytic anemia with hemoglobin of 9.8, down from 10.8 ten days earlier. Lactic acid is reassuring at 0.82. Urinalysis is suggestive of infection. Blood cultures were obtained, 30 cc/kg bolus of normal saline was given, and the patient was started on empiric vancomycin and cefepime. He remained hemodynamically stable in the ED and in no apparent respiratory distress, and he will be admitted to medical/surgical unit for ongoing evaluation and management of fevers and acute encephalopathy suspected secondary to acute UTI.  Review of Systems:  Unable to obtain secondary to the patient's clinical condition with dementia and acute encephalopathy.  Past Medical History:  Diagnosis Date  .  peripheral neuropathy --UDS--pain mngmt  09/05/2006   Qualifier: Diagnosis of  By: Larose Kells MD, [place] AAA (abdominal aortic aneurysm) ([place])   . Anemia   . Aortic dissection ([place]) 09/03/2016  . Arrhythmia 02/09/2016  . Arthritis    "all over" (02/10/2016)  . Basal cell carcinoma of face    "burned off" (02/10/2016)  . CAD (coronary artery disease)    MI 08-04-85  . Chronic lower back pain   . Decreased cardiac ejection fraction 09/04/2016   EF 35%  . Diabetes mellitus with neuropathy ([place])    "borderline" (02/10/2016)  . DM II (diabetes mellitus, type II), controlled ([place]) 09/05/2006   Qualifier: Diagnosis of  By: Larose Kells MD, [place] DVT (deep venous thrombosis) ([place]) 02/2016   "  left thigh"  . Essential hypertension 09/05/2006   Qualifier: Diagnosis of  By: Larose Kells MD, [place] Gait abnormality    chronic imbalance  . GAIT DISTURBANCE 08/22/2007   Qualifier: Diagnosis of  By: Larose Kells MD, [place] Gout    diskitis 10/2008, Dr Ouida Sills  . Gout 08/22/2007   Gout diagnosed  10-2008. he had a  L4 L5-S1 gout diskitis DEXA neg 3-09 and 08-2009   . Heart murmur dx'd 02/2016  . High cholesterol 03/23/2009   Qualifier: Diagnosis of  By: Larose Kells MD, [place] History of DVT (deep vein thrombosis) 02/09/2016  . Hyperlipidemia   . Hypertension   . Myocardial infarction ([place]) 1987   "before OHS"  . OSA (obstructive sleep apnea)    Limited CPAP tolerance (02/10/2016)  . Osteoarthritis   . Pneumonia 1938   had right pneumonia pleurisy requiring resection of ribs and chest tube drainage at age 57  . Pulmonary embolism ([place]) 02/2016   "left"  . Pulmonary fibrosis ([place]) 01/29/2013  . Renal insufficiency    chronic w/ solitary kidney, congenital  . RENAL INSUFFICIENCY, CHRONIC 09/05/2006   Qualifier: Diagnosis of  By: Larose Kells MD, [place] SLEEP APNEA 09/05/2006   History of a sleep study in 2006 by Dr. Annamaria Boots    . Small bowel obstruction ([place]) 04/01/2012  . SOLITARY KIDNEY, CONGENITAL 09/05/2006   Qualifier: Diagnosis of  By: Larose Kells MD, [place]     Past Surgical History:  Procedure Laterality Date  . ABDOMINAL AORTIC ANEURYSM REPAIR  ~ 1996   w/ iliac aneurysm repair i  . APPENDECTOMY    . CARDIAC CATHETERIZATION  1987   "before OHS"  . CARDIOVASCULAR STRESS TEST  10/13/2009   EF 57%  . CATARACT EXTRACTION W/ INTRAOCULAR LENS  IMPLANT, BILATERAL  09/1999,04/2003   right,left  . COLONOSCOPY    . CORONARY ARTERY BYPASS GRAFT  01/04/1986   CABG X5  . ESOPHAGOGASTRODUODENOSCOPY (EGD) WITH PROPOFOL N/A 02/22/2016   Procedure: ESOPHAGOGASTRODUODENOSCOPY (EGD) WITH PROPOFOL;  Surgeon: Milus Banister, MD;  Location: [facility];  Service: Endoscopy;  Laterality: N/A;  . INGUINAL HERNIA REPAIR Left 03/07/1983  . JOINT REPLACEMENT    . Knuckles replaced  05/2005   left hand  . NEPHRECTOMY Left 1996  . TONSILLECTOMY    . TOTAL KNEE ARTHROPLASTY Left 05/04/1989  . US ECHOCARDIOGRAPHY  01/14/2007   EF 55-60%     reports that he quit smoking about 44 years ago. His smoking use  included Cigarettes. He has a 30.00 pack-year smoking history. He has never used smokeless tobacco. He reports that he does not drink alcohol or use drugs.  Allergies  Allergen Reactions  . Colchicine Other (See Comments)    Reaction:  Unknown     Family History  Problem Relation Age of Onset  . Heart disease Father   . Lymphoma Sister   . Liver cancer Brother   . Lung cancer Brother   . Ulcerative colitis Brother   . Heart disease Brother   . Diabetes Brother   . Colon cancer Neg Hx   . Prostate cancer Neg Hx   . Stomach cancer Neg Hx   . Esophageal cancer Neg Hx   . Rectal cancer Neg Hx      Prior to Admission medications   Medication Sig Start Date End Date Taking? Authorizing Provider  ALPRAZolam (XANAX) 0.25 MG tablet Take 1 tablet (0.25 mg  total) by mouth 2 (two) times daily as needed for anxiety. 09/11/16  Yes Regalado, Belkys A, MD  atorvastatin (LIPITOR) 80 MG tablet Take 1 tablet (80 mg total) by mouth daily. 08/25/16  Yes Paz, Alda Berthold, MD  cyanocobalamin 100 MCG tablet Take 100 mcg by mouth daily.    Yes [provider]  febuxostat (ULORIC) 40 MG tablet Take 40 mg by mouth daily.    Yes [provider]  Flaxseed, Linseed, (FLAXSEED OIL) 1000 MG CAPS Take 1,000 mg by mouth 2 (two) times daily.    Yes [provider]  folic acid (FOLVITE) 1 MG tablet Take 1 mg by mouth daily.   Yes [provider]  gabapentin (NEURONTIN) 400 MG capsule Take 2 capsules (800 mg total) by mouth 3 (three) times daily. 02/29/16  Yes Paz, Alda Berthold, MD  glucosamine-chondroitin 500-400 MG tablet Take 1 tablet by mouth daily.    Yes [provider]  hydrALAZINE (APRESOLINE) 100 MG tablet Take 100 mg by mouth every 8 (eight) hours.   Yes [provider]  ipratropium-albuterol (DUONEB) 0.5-2.5 (3) MG/3ML SOLN Take 3 mLs by nebulization every 6 (six) hours as needed (for wheezing/shortness of breath).   Yes [provider]  isosorbide  dinitrate (ISORDIL) 10 MG tablet Take 1 tablet (10 mg total) by mouth 3 (three) times daily. 09/11/16  Yes Regalado, Belkys A, MD  methotrexate (RHEUMATREX) 2.5 MG tablet Take 15 mg by mouth every Monday. Caution:Chemotherapy. Protect from light.   Yes [provider]  metoprolol tartrate (LOPRESSOR) 50 MG tablet Take 1 tablet (50 mg total) by mouth 2 (two) times daily. 09/11/16  Yes Regalado, Belkys A, MD  Multiple Vitamins-Minerals (CENTRUM SILVER) tablet Take 1 tablet by mouth daily.    Yes [provider]  nitroGLYCERIN (NITROSTAT) 0.4 MG SL tablet Place 1 tablet (0.4 mg total) under the tongue every 5 (five) minutes as needed for chest pain. 08/05/15  Yes Nahser, Wonda Cheng, MD  omega-3 acid ethyl esters (LOVAZA) 1 g capsule Take 2 g by mouth daily.   Yes [provider]  omeprazole (PRILOSEC) 40 MG capsule Take 1 capsule (40 mg total) by mouth daily. Patient taking differently: Take 40 mg by mouth daily before breakfast.  02/15/16  Yes Milus Banister, MD  oxyCODONE-acetaminophen (PERCOCET/ROXICET) 5-325 MG tablet Take 1 tablet by mouth 4 (four) times daily as needed for severe pain. 09/11/16  Yes Regalado, Belkys A, MD  polyethylene glycol (MIRALAX / GLYCOLAX) packet Take 17 g by mouth daily as needed for mild constipation or moderate constipation.    Yes [provider]  predniSONE (DELTASONE) 2.5 MG tablet Take 2.5 mg by mouth daily with breakfast.   Yes [provider]  saccharomyces boulardii (FLORASTOR) 250 MG capsule Take 250 mg by mouth daily.   Yes [provider]    Physical Exam: Vitals:   09/17/16 1830 09/17/16 1900 09/17/16 1930 09/17/16 2000  BP: 127/62 (!) 128/59 138/68 (!) 147/76  Pulse: 74 71 77 (!) 32  Resp: 14 16 12 14   Temp:   99.8 F (37.7 C)   TempSrc:   Rectal   SpO2: 96% 96% 96% 97%  Weight:      Height:          Constitutional: No respiratory distress. In apparent discomfort. No pallor, no diaphoresis.  Eyes:  PERTLA, lids and conjunctivae normal ENMT: Mucous membranes are moist. Posterior pharynx clear of any exudate or lesions.   Neck: normal, supple,  no masses, no thyromegaly Respiratory: clear to auscultation bilaterally, no wheezing, no crackles. Normal respiratory effort.   Cardiovascular: Rate ~80 and regular with widely split S2 and grade 3 holosystolic murmur at RUSB. No extremity edema. No significant JVD. Abdomen: No distension, no tenderness, no masses palpated. Bowel sounds normal.  Musculoskeletal: no clubbing / cyanosis. No joint deformity upper and lower extremities.   Skin: no significant rashes, lesions, ulcers. Warm, dry, well-perfused. Neurologic: No gross facial asymmetry, moving all extremities spontaneously. Patellar DTR's wnl. PERRL.   Psychiatric: Alert and oriented to person only. Calm and cooperative.     Labs on Admission: I have personally reviewed following labs and imaging studies  CBC:  Recent Labs Lab 09/17/16 1805  WBC 15.9*  NEUTROABS 13.8*  HGB 9.8*  HCT 29.2*  MCV 97.7  PLT 485   Basic Metabolic Panel:  Recent Labs Lab 09/11/16 0409 09/17/16 1805  NA 132* 133*  K 4.8 4.6  CL 102 103  CO2 26 21*  GLUCOSE 109* 135*  BUN 45* 24*  CREATININE 1.25* 1.18  CALCIUM 9.2 9.1   GFR: Estimated Creatinine Clearance: 41.2 mL/min (by C-G formula based on SCr of 1.18 mg/dL). Liver Function Tests:  Recent Labs Lab 09/17/16 1805  AST 38  ALT 34  ALKPHOS 67  BILITOT 0.8  PROT 5.6*  ALBUMIN 2.1*   No results for input(s): LIPASE, AMYLASE in the last 168 hours. No results for input(s): AMMONIA in the last 168 hours. Coagulation Profile: No results for input(s): INR, PROTIME in the last 168 hours. Cardiac Enzymes: No results for input(s): CKTOTAL, CKMB, CKMBINDEX, TROPONINI in the last 168 hours. BNP (last 3 results) No results for input(s): PROBNP in the last 8760 hours. HbA1C: No results for input(s): HGBA1C in the last 72  hours. CBG:  Recent Labs Lab 09/10/16 2127 09/11/16 0751 09/11/16 1203  GLUCAP 137* 94 130*   Lipid Profile: No results for input(s): CHOL, HDL, LDLCALC, TRIG, CHOLHDL, LDLDIRECT in the last 72 hours. Thyroid Function Tests: No results for input(s): TSH, T4TOTAL, FREET4, T3FREE, THYROIDAB in the last 72 hours. Anemia Panel: No results for input(s): VITAMINB12, FOLATE, FERRITIN, TIBC, IRON, RETICCTPCT in the last 72 hours. Urine analysis:    Component Value Date/Time   COLORURINE YELLOW 09/17/2016 1734   APPEARANCEUR HAZY (A) 09/17/2016 1734   LABSPEC 1.026 09/17/2016 1734   PHURINE 6.0 09/17/2016 1734   GLUCOSEU NEGATIVE 09/17/2016 1734   GLUCOSEU NEGATIVE 10/21/2015 0903   HGBUR NEGATIVE 09/17/2016 1734   BILIRUBINUR NEGATIVE 09/17/2016 1734   KETONESUR 20 (A) 09/17/2016 1734   PROTEINUR 30 (A) 09/17/2016 1734   UROBILINOGEN 0.2 10/21/2015 0903   NITRITE NEGATIVE 09/17/2016 1734   LEUKOCYTESUR TRACE (A) 09/17/2016 1734   Sepsis Labs: @LABRCNTIP (procalcitonin:4,lacticidven:4) )No results found for this or any previous visit (from the past 240 hour(s)).   Radiological Exams on Admission: Dg Chest Port 1 View  Result Date: 09/17/2016 CLINICAL DATA:  Cough EXAM: PORTABLE CHEST 1 VIEW COMPARISON:  09/04/2016 chest radiograph FINDINGS: Intact sternotomy wires. CABG clips overlie the mediastinum. Stable cardiomediastinal silhouette with mild cardiomegaly and aortic atherosclerosis. No pneumothorax. No pleural effusion. Cephalization of the pulmonary vasculature without overt pulmonary edema. Stable reticular opacities at both lung bases. IMPRESSION: 1. Stable mild cardiomegaly without overt pulmonary edema. 2. Chronic bibasilar reticular lung opacities, favor scarring or atelectasis. Electronically Signed   By: Ilona Sorrel M.D.   On: 09/17/2016 18:02    EKG: Independently reviewed. Sinus rhythm, PVC's.   Assessment/Plan  1. UTI  - Pt presents with fevers and increased  confusion  - Found to have leukocytosis and UA suggestive of infection  - Lactic acid reassuring at 0.82 and renal fxn preserved  - Blood and urine cultures collected  - Plan to continue empiric cefepime pending culture data    2. Acute encephalopathy  - No focal deficits elicited  - Likely secondary to #1 and anticipate improvement with treatment of that   3. Hypertension  - BP at goal in ED  - Continue Lopressor, hydralazine  - Use prn labetalol IVP's prn    4. Chronic diastolic CHF  - There was concern for potential sepsis initially and he was treated with 30 cc/kg NS in ED  - Plan to SLIV now, follow daily wts and I/O's   5. Normocytic anemia - Hgb is 9.8 on admission, down from 10.8 ten days earlier  - No bleeding is evident  - Continue B12 and folate supplementation  - Repeat CBC in am   6. CKD stage III  - SCr is 1.18 on admission, better than recent priors  - Renally-dose medications as needed   7. ASCVD  - No anginal complaints, non-ischemic EKG  - Status-post remote AAA repair - Concern for penetrating ulcer in descending thoracic aorta managed conservatively with tight BP control  - Continue nitrates, statin, beta-blocker   8. Hyponatremia  - Serum sodium is 133 on admission  - He appeared dry on presentation and was given 2.25 liters NS in ED  - Repeat chem panel in am    DVT prophylaxis: SCD's  Code Status: DNR Family Communication: Wife and granddaughter updated at bedside Disposition Plan: Admit to med-surg Consults called: None Admission status: Inpatient    Vianne Bulls, MD [facility] Pager [phone]  If 7PM-7AM, please contact night-coverage [email or web address] Password [email or web address]  09/17/2016, 8:20 PM

## 2016-09-17 NOTE — ED Provider Notes (Signed)
[facility] DEPT Provider Note   CSN: [id number] Arrival date & time: 09/17/16  1726     History   Chief Complaint Chief Complaint  Patient presents with  . Fever    HPI Derrick Fry is a 83 y.o. male.  83 year old male presents from nursing home with fever and altered mental status. Was diagnosed with pneumonia yesterday and was started on Levaquin.. Patient does have a valid DO NOT RESUSCITATE from the facility. Has a baseline history of dementia and therefore the history is limited. Was given a gram of Tylenol prior to arrival and transported by EMS. No further history obtainable due to his current state      Past Medical History:  Diagnosis Date  .  peripheral neuropathy --UDS--pain mngmt  09/05/2006   Qualifier: Diagnosis of  By: Larose Kells MD, [place] AAA (abdominal aortic aneurysm) ([place])   . Anemia   . Aortic dissection ([place]) 09/03/2016  . Arrhythmia 02/09/2016  . Arthritis    "all over" (02/10/2016)  . Basal cell carcinoma of face    "burned off" (02/10/2016)  . CAD (coronary artery disease)    MI 08[birth year]  . Chronic lower back pain   . Decreased cardiac ejection fraction 09/04/2016   EF 35%  . Diabetes mellitus with neuropathy ([place])    "borderline" (02/10/2016)  . DM II (diabetes mellitus, type II), controlled ([place]) 09/05/2006   Qualifier: Diagnosis of  By: Larose Kells MD, [place] DVT (deep venous thrombosis) ([place]) 02/2016   "left thigh"  . Essential hypertension 09/05/2006   Qualifier: Diagnosis of  By: Larose Kells MD, [place] Gait abnormality    chronic imbalance  . GAIT DISTURBANCE 08/22/2007   Qualifier: Diagnosis of  By: Larose Kells MD, [place] Gout    diskitis 10/2008, Dr Ouida Sills  . Gout 08/22/2007   Gout diagnosed 10-2008. he had a  L4 L5-S1 gout diskitis DEXA neg 3-09 and 08-2009   . Heart murmur dx'd 02/2016  . High cholesterol 03/23/2009   Qualifier: Diagnosis of  By: Larose Kells MD, [place] History of DVT (deep vein thrombosis) 02/09/2016  . Hyperlipidemia   .  Hypertension   . Myocardial infarction ([place]) 1987   "before OHS"  . OSA (obstructive sleep apnea)    Limited CPAP tolerance (02/10/2016)  . Osteoarthritis   . Pneumonia 1938   had right pneumonia pleurisy requiring resection of ribs and chest tube drainage at age 72  . Pulmonary embolism ([place]) 02/2016   "left"  . Pulmonary fibrosis ([place]) 01/29/2013  . Renal insufficiency    chronic w/ solitary kidney, congenital  . RENAL INSUFFICIENCY, CHRONIC 09/05/2006   Qualifier: Diagnosis of  By: Larose Kells MD, [place] SLEEP APNEA 09/05/2006   History of a sleep study in 2006 by Dr. Annamaria Boots    . Small bowel obstruction ([place]) 04/01/2012  . SOLITARY KIDNEY, CONGENITAL 09/05/2006   Qualifier: Diagnosis of  By: Larose Kells MD, [place]     Patient Active Problem List   Diagnosis Date Noted  . Cardiomyopathy ([place]) 09/13/2016  . Hyperkalemia 09/13/2016  . Acute kidney injury ([place]) 09/13/2016  . Decreased cardiac ejection fraction   . Aortic dissection ([place]) 09/03/2016  . Acute bilateral thoracic back pain   . Abdominal pain, chronic, epigastric   . Gastritis and gastroduodenitis   . Diarrhea   . Pulmonary embolus ([place])   . Pulmonary embolism ([place]) 02/09/2016  .  History of DVT (deep vein thrombosis) 02/09/2016  . Nausea, vomiting and diarrhea 02/09/2016  . Failure to thrive in adult 02/09/2016  . Hyponatremia 02/09/2016  . Loss of weight 02/09/2016  . Protein calorie malnutrition ([place]) 02/09/2016  . Arrhythmia 02/09/2016  . PCP NOTES >>>>> 11/20/2014  . Buzzing in ear 06/04/2013  . Pulmonary fibrosis ([place]) 01/29/2013  . Annual physical exam 07/11/2010  . AAA (abdominal aortic aneurysm) ([place]) 07/11/2010  . Hyperlipidemia 03/23/2009  . Gout 08/22/2007  . GAIT DISTURBANCE 08/22/2007  . Osteoarthritis  04/23/2007  . OSA (obstructive sleep apnea) 01/03/2007  . DM II (diabetes mellitus, type II), controlled ([place]) 09/05/2006  .  peripheral neuropathy --UDS--pain mngmt  09/05/2006  . Hypertensive heart disease  with congestive heart failure ([place]) 09/05/2006  . CAD (coronary artery disease) 09/05/2006  . RENAL INSUFFICIENCY, CHRONIC 09/05/2006  . SOLITARY KIDNEY, CONGENITAL 09/05/2006  . SLEEP APNEA 09/05/2006    Past Surgical History:  Procedure Laterality Date  . ABDOMINAL AORTIC ANEURYSM REPAIR  ~ 1996   w/ iliac aneurysm repair i  . APPENDECTOMY    . CARDIAC CATHETERIZATION  1987   "before OHS"  . CARDIOVASCULAR STRESS TEST  10/13/2009   EF 57%  . CATARACT EXTRACTION W/ INTRAOCULAR LENS  IMPLANT, BILATERAL  09/1999,04/2003   right,left  . COLONOSCOPY    . CORONARY ARTERY BYPASS GRAFT  01/04/1986   CABG X5  . ESOPHAGOGASTRODUODENOSCOPY (EGD) WITH PROPOFOL N/A 02/22/2016   Procedure: ESOPHAGOGASTRODUODENOSCOPY (EGD) WITH PROPOFOL;  Surgeon: Milus Banister, MD;  Location: [facility];  Service: Endoscopy;  Laterality: N/A;  . INGUINAL HERNIA REPAIR Left 03/07/1983  . JOINT REPLACEMENT    . Knuckles replaced  05/2005   left hand  . NEPHRECTOMY Left 1996  . TONSILLECTOMY    . TOTAL KNEE ARTHROPLASTY Left 05/04/1989  . US ECHOCARDIOGRAPHY  01/14/2007   EF 55-60%       Home Medications    Prior to Admission medications   Medication Sig Start Date End Date Taking? Authorizing Provider  ALPRAZolam (XANAX) 0.25 MG tablet Take 1 tablet (0.25 mg total) by mouth 2 (two) times daily as needed for anxiety. 09/11/16   Regalado, Belkys A, MD  atorvastatin (LIPITOR) 80 MG tablet Take 1 tablet (80 mg total) by mouth daily. 08/25/16   Colon Branch, MD  cyanocobalamin 100 MCG tablet Take 100 mcg by mouth daily.     [provider]  febuxostat (ULORIC) 40 MG tablet Take 40 mg by mouth daily.     [provider]  fish oil-omega-3 fatty acids 1000 MG capsule Take 2 g by mouth daily.     [provider]  Flaxseed, Linseed, (FLAXSEED OIL) 1000 MG CAPS Take 1,000 mg by mouth 2 (two) times daily.     [provider]  folic acid (FOLVITE) 1 MG tablet Take 1 mg by mouth  daily.    [provider]  gabapentin (NEURONTIN) 400 MG capsule Take 2 capsules (800 mg total) by mouth 3 (three) times daily. 02/29/16   Colon Branch, MD  glucosamine-chondroitin 500-400 MG tablet Take 1 tablet by mouth daily.     [provider]  glucose blood (ONE TOUCH ULTRA TEST) test strip Check blood sugar no more than twice daily. Patient not taking: Reported on 03/15/2016 07/20/14   Colon Branch, MD  hydrALAZINE (APRESOLINE) 50 MG tablet Take 1 tablet (50 mg total) by mouth every 8 (eight) hours. 09/11/16   Regalado, Cassie Freer, MD  isosorbide  dinitrate (ISORDIL) 10 MG tablet Take 1 tablet (10 mg total) by mouth 3 (three) times daily. 09/11/16   Regalado, Belkys A, MD  Lancets (ONETOUCH ULTRASOFT) lancets Check blood sugar no more than twice daily. Patient not taking: Reported on 03/15/2016 07/20/14   Colon Branch, MD  methotrexate (RHEUMATREX) 2.5 MG tablet Take 15 mg by mouth once a week. Caution:Chemotherapy. Protect from light. Every Monday    [provider]  metoprolol tartrate (LOPRESSOR) 50 MG tablet Take 1 tablet (50 mg total) by mouth 2 (two) times daily. 09/11/16   Regalado, Belkys A, MD  Multiple Vitamins-Minerals (CENTRUM SILVER) tablet Take 1 tablet by mouth daily.     [provider]  nitroGLYCERIN (NITROSTAT) 0.4 MG SL tablet Place 1 tablet (0.4 mg total) under the tongue every 5 (five) minutes as needed for chest pain. Patient not taking: Reported on 03/27/2016 08/05/15   Nahser, Wonda Cheng, MD  omeprazole (PRILOSEC) 40 MG capsule Take 1 capsule (40 mg total) by mouth daily. 02/15/16   Milus Banister, MD  oxyCODONE-acetaminophen (PERCOCET/ROXICET) 5-325 MG tablet Take 1 tablet by mouth 4 (four) times daily as needed for severe pain. 09/11/16   Regalado, Belkys A, MD  polyethylene glycol (MIRALAX / GLYCOLAX) packet Take 17 g by mouth as needed for mild constipation or moderate constipation.     [provider]  predniSONE (DELTASONE) 5 MG tablet  Take 2.5 mg by mouth daily.     [provider]  Probiotic Product (PROBIOTIC-10 PO) Take 1 tablet by mouth daily.    [provider]    Family History Family History  Problem Relation Age of Onset  . Heart disease Father   . Lymphoma Sister   . Liver cancer Brother   . Lung cancer Brother   . Ulcerative colitis Brother   . Heart disease Brother   . Diabetes Brother   . Colon cancer Neg Hx   . Prostate cancer Neg Hx   . Stomach cancer Neg Hx   . Esophageal cancer Neg Hx   . Rectal cancer Neg Hx     Social History Social History  Substance Use Topics  . Smoking status: Former Smoker    Packs/day: 1.00    Years: 30.00    Types: Cigarettes    Quit date: 04/02/1972  . Smokeless tobacco: Never Used  . Alcohol use No     Comment: former heavy alcohol use     Allergies   Colchicine   Review of Systems Review of Systems  Unable to perform ROS: Dementia     Physical Exam Updated Vital Signs BP (!) 141/71 (BP Location: Left Arm)   Pulse 82   Temp (!) 102.7 F (39.3 C) (Rectal) Comment: Simultaneous filing. User may not have seen previous data. Comment (Src): Simultaneous filing. User may not have seen previous data.  Resp 18   SpO2 95%   Physical Exam  Constitutional: He appears well-developed and well-nourished. He appears lethargic.  Non-toxic appearance. No distress.  HENT:  Head: Normocephalic and atraumatic.  Eyes: Pupils are equal, round, and reactive to light. Conjunctivae, EOM and lids are normal.  Neck: Normal range of motion. Neck supple. No tracheal deviation present. No thyroid mass present.  Cardiovascular: Normal rate, regular rhythm and normal heart sounds.  Exam reveals no gallop.   No murmur heard. Pulmonary/Chest: No stridor. Tachypnea noted. No respiratory distress. He has decreased breath sounds. He has no wheezes. He has no rhonchi. He has no rales.  Abdominal: Soft. Normal appearance and bowel sounds are normal. He exhibits  no distension. There is no tenderness. There is no rebound and no CVA tenderness.  Musculoskeletal: Normal range of motion. He exhibits no edema or tenderness.  Neurological: He appears lethargic. He is disoriented. He displays atrophy. No cranial nerve deficit or sensory deficit. GCS eye subscore is 4. GCS verbal subscore is 4. GCS motor subscore is 5.  Skin: Skin is warm and dry. No abrasion and no rash noted.  Psychiatric: His speech is delayed. He is withdrawn. He is inattentive.  Nursing note and vitals reviewed.    ED Treatments / Results  Labs (all labs ordered are listed, but only abnormal results are displayed) Labs Reviewed  CULTURE, BLOOD (ROUTINE X 2)  CULTURE, BLOOD (ROUTINE X 2)  COMPREHENSIVE METABOLIC PANEL  CBC WITH DIFFERENTIAL/PLATELET  URINALYSIS, ROUTINE W REFLEX MICROSCOPIC  I-STAT CG4 LACTIC ACID, ED    EKG  EKG Interpretation None       Radiology No results found.  Procedures Procedures (including critical care time)  Medications Ordered in ED Medications  0.9 %  sodium chloride infusion (not administered)  sodium chloride 0.9 % bolus 1,000 mL (not administered)    And  sodium chloride 0.9 % bolus 1,000 mL (not administered)    And  sodium chloride 0.9 % bolus 250 mL (not administered)  ceFEPIme (MAXIPIME) 2 g in dextrose 5 % 50 mL IVPB (not administered)  vancomycin (VANCOCIN) IVPB 1000 mg/200 mL premix (not administered)     Initial Impression / Assessment and Plan / ED Course  I have reviewed the triage vital signs and the nursing notes.  Pertinent labs & imaging results that were available during my care of the patient were reviewed by me and considered in my medical decision making (see chart for details).     Code sepsis initiated and patient given IV fluids and started on IV antibiotics and will be admitted to the hospital.  Final Clinical Impressions(s) / ED Diagnoses   Final diagnoses:  None    New Prescriptions New  Prescriptions   No medications on file     Lacretia Leigh, MD 09/17/16 1752

## 2016-09-17 NOTE — ED Notes (Signed)
Bed: WA04 Expected date: 09/17/16 Expected time: 5:19 PM Means of arrival: Ambulance Comments: Effusions, fever

## 2016-09-17 NOTE — Progress Notes (Addendum)
Pharmacy Antibiotic Note  Derrick Fry is a 83 y.o. male admitted on 09/17/2016 with pneumonia.  Pharmacy has been consulted for Vancomycin and Cefepime dosing.  Plan: Vancomycin 1g IV x 1 ordered by ED provider. Continue with Vancomycin 750mg  IV q12h.  Plan for Vancomycin trough level at steady state.  Cefepime 2g IV x 1 ordered by ED provider. Continue with Cefepime 1g IV q12h.  Monitor renal function, cultures, clinical course.     Height: 5\' 7"  (170.2 cm) Weight: 156 lb (70.8 kg) IBW/kg (Calculated) : 66.1  Temp (24hrs), Avg:101.3 F (38.5 C), Min:99.8 F (37.7 C), Max:102.7 F (39.3 C)   Recent Labs Lab 09/11/16 0409 09/17/16 1805 09/17/16 1807  WBC  --  15.9*  --   CREATININE 1.25* 1.18  --   LATICACIDVEN  --   --  0.82    Estimated Creatinine Clearance: 41.2 mL/min (by C-G formula based on SCr of 1.18 mg/dL).    Allergies  Allergen Reactions  . Colchicine Other (See Comments)    Reaction:  Unknown     Antimicrobials this admission: 7/15 >> Vancomycin >> 7/15 >> Cefepime >>  Dose adjustments this admission: --  Microbiology results: 7/15 BCx: sent   Thank you for allowing pharmacy to be a part of this patient's care.   Lindell Spar, PharmD, BCPS Pager: [phone] 09/17/2016 5:57 PM   Addendum: Admitting physician now has discontinued Vancomycin and asked pharmacy to dose Cefepime for UTI.   Continue above dose of Cefepime 1g IV q12h.  Lindell Spar, PharmD, BCPS Pager: [phone] 09/17/2016 8:36 PM

## 2016-09-18 ENCOUNTER — Telehealth: Payer: Self-pay | Admitting: Internal Medicine

## 2016-09-18 ENCOUNTER — Encounter (HOSPITAL_COMMUNITY): Payer: Self-pay | Admitting: Radiology

## 2016-09-18 ENCOUNTER — Inpatient Hospital Stay (HOSPITAL_COMMUNITY): Payer: PPO

## 2016-09-18 LAB — BASIC METABOLIC PANEL
ANION GAP: 9 (ref 5–15)
BUN: 20 mg/dL (ref 6–20)
CALCIUM: 8.4 mg/dL — AB (ref 8.9–10.3)
CO2: 19 mmol/L — ABNORMAL LOW (ref 22–32)
Chloride: 104 mmol/L (ref 101–111)
Creatinine, Ser: 1.01 mg/dL (ref 0.61–1.24)
GFR calc Af Amer: >60 mL/min (ref >60)
GFR calc non Af Amer: >60 mL/min (ref >60)
Glucose, Bld: 119 mg/dL — ABNORMAL HIGH (ref 65–99)
POTASSIUM: 4.4 mmol/L (ref 3.5–5.1)
SODIUM: 132 mmol/L — AB (ref 135–145)

## 2016-09-18 LAB — CBC WITH DIFFERENTIAL/PLATELET
Basophils Absolute: 0 10*3/uL (ref 0.0–0.1)
Basophils Relative: 0 %
EOS ABS: 0 10*3/uL (ref 0.0–0.7)
Eosinophils Relative: 0 %
HCT: 30.5 % — ABNORMAL LOW (ref 39.0–52.0)
HEMOGLOBIN: 10.2 g/dL — AB (ref 13.0–17.0)
LYMPHS ABS: 1.5 10*3/uL (ref 0.7–4.0)
Lymphocytes Relative: 8 %
MCH: 33.1 pg (ref 26.0–34.0)
MCHC: 33.4 g/dL (ref 30.0–36.0)
MCV: 99 fL (ref 78.0–100.0)
MONOS PCT: 5 %
Monocytes Absolute: 0.9 10*3/uL (ref 0.1–1.0)
NEUTROS PCT: 87 %
Neutro Abs: 15.9 10*3/uL — ABNORMAL HIGH (ref 1.7–7.7)
Platelets: 149 10*3/uL — ABNORMAL LOW (ref 150–400)
RBC: 3.08 MIL/uL — ABNORMAL LOW (ref 4.22–5.81)
RDW: 16.4 % — ABNORMAL HIGH (ref 11.5–15.5)
WBC: 18.3 10*3/uL — ABNORMAL HIGH (ref 4.0–10.5)

## 2016-09-18 LAB — MRSA PCR SCREENING: MRSA BY PCR: NEGATIVE

## 2016-09-18 MED ORDER — NYSTATIN 100000 UNIT/ML MT SUSP
5.0000 mL | Freq: Four times a day (QID) | OROMUCOSAL | Status: DC
  Administered 2016-09-18 – 2016-09-23 (×16): 500000 [IU] via ORAL
  Filled 2016-09-18 (×15): qty 5

## 2016-09-18 MED ORDER — METOPROLOL TARTRATE 5 MG/5ML IV SOLN
2.5000 mg | Freq: Four times a day (QID) | INTRAVENOUS | Status: DC
Start: 2016-09-18 — End: 2016-09-22
  Administered 2016-09-18 – 2016-09-22 (×15): 2.5 mg via INTRAVENOUS
  Filled 2016-09-18 (×14): qty 5

## 2016-09-18 MED ORDER — FUROSEMIDE 10 MG/ML IJ SOLN
40.0000 mg | Freq: Every day | INTRAMUSCULAR | Status: DC
  Administered 2016-09-18 – 2016-09-19 (×2): 40 mg via INTRAVENOUS
  Filled 2016-09-18 (×2): qty 4

## 2016-09-18 MED ORDER — HYDROCORTISONE NA SUCCINATE PF 100 MG IJ SOLR
50.0000 mg | Freq: Three times a day (TID) | INTRAMUSCULAR | Status: DC
  Administered 2016-09-18 – 2016-09-19 (×2): 50 mg via INTRAVENOUS
  Filled 2016-09-18 (×2): qty 2

## 2016-09-18 MED ORDER — GADOBENATE DIMEGLUMINE 529 MG/ML IV SOLN
14.0000 mL | Freq: Once | INTRAVENOUS | Status: AC | PRN
  Administered 2016-09-18: 14 mL via INTRAVENOUS

## 2016-09-18 NOTE — Telephone Encounter (Signed)
Spoke w/ the patient's wife, counsled to the best of my ability, evidently Toshiro is very sick. We'll keep in touch.

## 2016-09-18 NOTE — Telephone Encounter (Signed)
FYI

## 2016-09-18 NOTE — Progress Notes (Signed)
Pt is aphasic,will respond to voices. Wife and daughter not sure if pt recognizes them. Pt has flat affect. He keeps his head arched back leaning towards the RT. Bed alarm on. Family at bedside  During the day.

## 2016-09-18 NOTE — Progress Notes (Signed)
Derrick Fry IEP:[id number] DOB: [birth year] DOA: 09/17/2016 PCP: Colon Branch, MD  HPI/Recap of past 24 hours:  Fever subsided Open eyes to voices, follow commands inconsistently, he does not talk Family at bedside  Assessment/Plan: Principal Problem:   Acute lower UTI Active Problems:   DM II (diabetes mellitus, type II), controlled ([place])   OSA (obstructive sleep apnea)   CAD (coronary artery disease)   CKD (chronic kidney disease), stage III   AAA (abdominal aortic aneurysm) (H[facility])   Normocytic anemia   Pulmonary fibrosis ([facility])   Chronic systolic CHF (congestive heart failure) ([place])   UTI (urinary tract infection)  Sepsis presented on admission with metabolic encephalopathy and UTI in an immunosuppressed individual  -patient is sent from SNF due to fever 102.7, altered mental status, he is found to have UTI on admission with leukocytosis wbc 15.9, lactic acid wnl 0.82 -blood culture and urine culture in process, will add MRSA screening. cxr on admission with "Chronic bibasilar reticular lung opacities, favor scarring or atelectasis." with h/o pulmonary fibrosis and bibasilar crackles on exam, patient does not have cough, no hypoxia. -methotrexate held since admission, he is stated on cefepime since admission, will continue for now, f/u culture -Will get ct head.   Oral thrush:  Topical nystatin   Acute on chronic systolic chf: -Last EF 66-06% two weeks ago -On exam he has bilateral lower extremity pitting edema -His blood pressure  And renal function is stable,  -Will give iv lasix, strict intake and output   Hyponatremia: sodium 132-133, likely hypervolemic hyponatremia, on lasix  HTN: continue home bp meds  H/o AAA s/p repair, ? Descending thoracic aortic ulcer, he was evaluated by thoracic surgery during last hospitalization from 7/1-7/9, he is deemed not a surgical candidate, he was taken off xarelto.    H/o DVT/PE: off xarelto due to concerning  of descending thoracic aortic ulcer.   Diabetes, likely steroid induced ( a1c 6.7 -6.9 last few years Am blood sugar 119 monitor  CKD III, cr stable, renal dosing meds  H/o pulmonary fibrosis ( chronic pulmonary bibasilar  Crackles per pmd), h/o psoriatic arthritis on Methotrexate, daily prednisone 2.5mg  Methotrexate held due to sepsis, prednisone continued  H/o gout: he is on uloric at home, will check uric acid, consider changed to allopurinol due to increased cardiac side effect from uloric.   Code Status: DNR  Family Communication: patient , wife and daughter at bedside  Disposition Plan: not ready for discharge, expect return to SNF if he can improve   Consultants:  none  Procedures:  none  Antibiotics:  cefepime   Objective: BP (!) 148/68 (BP Location: Left Arm)   Pulse 92   Temp 98.6 F (37 C) (Oral)   Resp 18   Ht 5\' 7"  (1.702 m)   Wt 71.2 kg (157 lb)   SpO2 97%   BMI 24.59 kg/m   Intake/Output Summary (Last 24 hours) at 09/18/16 0744 Last data filed at 09/17/16 1916  Gross per 24 hour  Intake             1300 ml  Output                0 ml  Net             1300 ml   Filed Weights   09/17/16 1810 09/17/16 2333  Weight: 70.8 kg (156 lb) 71.2 kg (157 lb)    Exam:   General:  Frail, chronically  ill elderly male, very lethargic, nonverbal, Open eyes slightly  to voices, follow commands in consistently  Cardiovascular: RRR, occasionally ectopic beats  Respiratory: bibasilar crackles,  No wheezing, no rhonchi  Abdomen: Soft/ND/NT, positive BS  Musculoskeletal: bilateral lower extremity pitting Edema  Neuro: very lethargic, nonverbal, Open eyes slightly  to voices, follow commands in consistently   Data Reviewed: Basic Metabolic Panel:  Recent Labs Lab 09/17/16 1805 09/18/16 0358  NA 133* 132*  K 4.6 4.4  CL 103 104  CO2 21* 19*  GLUCOSE 135* 119*  BUN 24* 20  CREATININE 1.18 1.01  CALCIUM 9.1 8.4*   Liver Function  Tests:  Recent Labs Lab 09/17/16 1805  AST 38  ALT 34  ALKPHOS 67  BILITOT 0.8  PROT 5.6*  ALBUMIN 2.1*   No results for input(s): LIPASE, AMYLASE in the last 168 hours. No results for input(s): AMMONIA in the last 168 hours. CBC:  Recent Labs Lab 09/17/16 1805 09/18/16 0358  WBC 15.9* 18.3*  NEUTROABS 13.8* 15.9*  HGB 9.8* 10.2*  HCT 29.2* 30.5*  MCV 97.7 99.0  PLT 176 149*   Cardiac Enzymes:   No results for input(s): CKTOTAL, CKMB, CKMBINDEX, TROPONINI in the last 168 hours. BNP (last 3 results) No results for input(s): BNP in the last 8760 hours.  ProBNP (last 3 results) No results for input(s): PROBNP in the last 8760 hours.  CBG:  Recent Labs Lab 09/11/16 0751 09/11/16 1203  GLUCAP 94 130*    Recent Results (from the past 240 hour(s))  Blood Culture (routine x 2)     Status: None (Preliminary result)   Collection Time: 09/17/16  6:04 PM  Result Value Ref Range Status   Specimen Description BLOOD LEFT WRIST  Final   Special Requests IN PEDIATRIC BOTTLE Blood Culture adequate volume  Final   Culture PENDING  Incomplete   Report Status PENDING  Incomplete     Studies: Dg Chest Port 1 View  Result Date: 09/17/2016 CLINICAL DATA:  Cough EXAM: PORTABLE CHEST 1 VIEW COMPARISON:  09/04/2016 chest radiograph FINDINGS: Intact sternotomy wires. CABG clips overlie the mediastinum. Stable cardiomediastinal silhouette with mild cardiomegaly and aortic atherosclerosis. No pneumothorax. No pleural effusion. Cephalization of the pulmonary vasculature without overt pulmonary edema. Stable reticular opacities at both lung bases. IMPRESSION: 1. Stable mild cardiomegaly without overt pulmonary edema. 2. Chronic bibasilar reticular lung opacities, favor scarring or atelectasis. Electronically Signed   By: Ilona Sorrel M.D.   On: 09/17/2016 18:02    Scheduled Meds: . atorvastatin  80 mg Oral q1800  . febuxostat  40 mg Oral Daily  . folic acid  1 mg Oral Daily  .  gabapentin  800 mg Oral TID  . hydrALAZINE  100 mg Oral Q8H  . isosorbide dinitrate  10 mg Oral TID  . metoprolol tartrate  50 mg Oral BID  . multivitamin with minerals  1 tablet Oral Daily  . omega-3 acid ethyl esters  2 g Oral Daily  . predniSONE  2.5 mg Oral Q breakfast  . saccharomyces boulardii  250 mg Oral Daily  . sodium chloride flush  3 mL Intravenous Q12H  . cyanocobalamin  100 mcg Oral Daily    Continuous Infusions: . sodium chloride    . ceFEPime (MAXIPIME) IV       Time spent: 84mins  Mishelle Hassan MD, PhD  [facility] Pager [phone]. If 7PM-7AM, please contact night-coverage at [email or web address], password [facility] 09/18/2016, 7:44 AM  LOS: 1 day

## 2016-09-18 NOTE — Telephone Encounter (Signed)
Caller name: Wells Guiles  Relation to pt: spouse Call back number: [phone] Pharmacy:  Reason for call: Pt's spouse wanted to inform provider that pt was admitted at the hospital yesterday. Had a fever 101 from yesterday, reason taken to hospital is that pt did not want to talk or eat and had a fever.

## 2016-09-18 NOTE — Progress Notes (Signed)
Pt admitted to 40, ED RN reported pts family left right before he was brought upstairs. Oriented to self only, somewhat drowsy and lethargic. Unable to complete majority of admission H&P due to AMS. Resting comfortably in bed with bed alarm activated. Continue to monitor. Hortencia Conradi RN

## 2016-09-18 NOTE — Progress Notes (Signed)
CSW met with family to assist with d/c planning. Pt is alert and oriented x1 and unable to participate in dc planning. Pt was sleeping during CSW visit with family. Pt was recently hospitalized at [facility] ( 09/03/16-09/11/16 ) and dc to [facility] on 09/11/16 for ST Rehab. Please see psychosocial assessment dated 09/09/16. Pt was hospitalized from SNF on 09/17/16. Family reports that dc plans are unclear at this point. " We need to speak with MD ." CSW will speak with family again once MD as provided medical update.   Werner Lean LCSW [phone]

## 2016-09-19 DIAGNOSIS — Z8249 Family history of ischemic heart disease and other diseases of the circulatory system: Secondary | ICD-10-CM

## 2016-09-19 DIAGNOSIS — Z800 Family history of malignant neoplasm of digestive organs: Secondary | ICD-10-CM

## 2016-09-19 DIAGNOSIS — E1122 Type 2 diabetes mellitus with diabetic chronic kidney disease: Secondary | ICD-10-CM

## 2016-09-19 DIAGNOSIS — Z833 Family history of diabetes mellitus: Secondary | ICD-10-CM

## 2016-09-19 DIAGNOSIS — Z801 Family history of malignant neoplasm of trachea, bronchus and lung: Secondary | ICD-10-CM

## 2016-09-19 DIAGNOSIS — Z79899 Other long term (current) drug therapy: Secondary | ICD-10-CM

## 2016-09-19 DIAGNOSIS — Z888 Allergy status to other drugs, medicaments and biological substances status: Secondary | ICD-10-CM

## 2016-09-19 LAB — BASIC METABOLIC PANEL
Anion gap: 11 (ref 5–15)
BUN: 21 mg/dL — AB (ref 6–20)
CALCIUM: 8.6 mg/dL — AB (ref 8.9–10.3)
CHLORIDE: 101 mmol/L (ref 101–111)
CO2: 20 mmol/L — AB (ref 22–32)
CREATININE: 1.23 mg/dL (ref 0.61–1.24)
GFR calc Af Amer: 59 mL/min — ABNORMAL LOW (ref >60)
GFR calc non Af Amer: 51 mL/min — ABNORMAL LOW (ref >60)
GLUCOSE: 136 mg/dL — AB (ref 65–99)
Potassium: 3.5 mmol/L (ref 3.5–5.1)
Sodium: 132 mmol/L — ABNORMAL LOW (ref 135–145)

## 2016-09-19 LAB — CBC
HEMATOCRIT: 29.2 % — AB (ref 39.0–52.0)
HEMOGLOBIN: 10 g/dL — AB (ref 13.0–17.0)
MCH: 33.6 pg (ref 26.0–34.0)
MCHC: 34.2 g/dL (ref 30.0–36.0)
MCV: 98 fL (ref 78.0–100.0)
Platelets: 147 10*3/uL — ABNORMAL LOW (ref 150–400)
RBC: 2.98 MIL/uL — ABNORMAL LOW (ref 4.22–5.81)
RDW: 16.3 % — AB (ref 11.5–15.5)
WBC: 13.3 10*3/uL — ABNORMAL HIGH (ref 4.0–10.5)

## 2016-09-19 LAB — URIC ACID: Uric Acid, Serum: 3 mg/dL — ABNORMAL LOW (ref 4.4–7.6)

## 2016-09-19 LAB — URINE CULTURE: Culture: NO GROWTH

## 2016-09-19 LAB — MAGNESIUM: Magnesium: 1.7 mg/dL (ref 1.7–2.4)

## 2016-09-19 MED ORDER — SODIUM CHLORIDE 0.9 % IV SOLN
500.0000 mL | INTRAVENOUS | Status: DC
  Administered 2016-09-20 – 2016-09-25 (×6): 500 mL via INTRAVENOUS

## 2016-09-19 MED ORDER — DEXTROSE 5 % IV SOLN
10.0000 mL | INTRAVENOUS | Status: DC
  Administered 2016-09-21 – 2016-09-24 (×3): 10 mL via INTRAVENOUS
  Filled 2016-09-19 (×2): qty 25

## 2016-09-19 MED ORDER — VANCOMYCIN HCL IN DEXTROSE 1-5 GM/200ML-% IV SOLN
1000.0000 mg | INTRAVENOUS | Status: DC
  Administered 2016-09-20 – 2016-09-22 (×3): 1000 mg via INTRAVENOUS
  Filled 2016-09-19 (×3): qty 200

## 2016-09-19 MED ORDER — SODIUM CHLORIDE 0.9 % IV SOLN
100.0000 mg | INTRAVENOUS | Status: DC
  Filled 2016-09-19: qty 100

## 2016-09-19 MED ORDER — SODIUM CHLORIDE 0.9 % IV SOLN
500.0000 mL | INTRAVENOUS | Status: DC
  Administered 2016-09-19 – 2016-09-25 (×7): 500 mL via INTRAVENOUS

## 2016-09-19 MED ORDER — DEXTROSE 5 % IV SOLN
5.0000 mg/kg | INTRAVENOUS | Status: DC
  Administered 2016-09-19 – 2016-09-24 (×6): 350 mg via INTRAVENOUS
  Filled 2016-09-19 (×8): qty 350

## 2016-09-19 MED ORDER — SODIUM CHLORIDE 0.9 % IV SOLN: 100.0000 mg | INTRAVENOUS | Status: DC

## 2016-09-19 MED ORDER — ACETAMINOPHEN 325 MG PO TABS: 650.0000 mg | ORAL_TABLET | Freq: Every day | ORAL | Status: DC | PRN

## 2016-09-19 MED ORDER — DEXTROSE 5 % IV SOLN
2.0000 g | Freq: Two times a day (BID) | INTRAVENOUS | Status: DC
  Administered 2016-09-19 – 2016-09-25 (×13): 2 g via INTRAVENOUS
  Filled 2016-09-19 (×14): qty 2

## 2016-09-19 MED ORDER — VANCOMYCIN HCL 10 G IV SOLR
1250.0000 mg | Freq: Once | INTRAVENOUS | Status: AC
  Administered 2016-09-19: 1250 mg via INTRAVENOUS
  Filled 2016-09-19: qty 1250

## 2016-09-19 MED ORDER — DEXAMETHASONE SODIUM PHOSPHATE 4 MG/ML IJ SOLN
4.0000 mg | Freq: Four times a day (QID) | INTRAMUSCULAR | Status: DC
  Administered 2016-09-19 – 2016-09-24 (×20): 4 mg via INTRAVENOUS
  Filled 2016-09-19 (×20): qty 1

## 2016-09-19 MED ORDER — SODIUM CHLORIDE 0.9 % IV SOLN
10.0000 mg | Freq: Once | INTRAVENOUS | Status: AC
  Administered 2016-09-19: 10 mg via INTRAVENOUS
  Filled 2016-09-19: qty 1

## 2016-09-19 MED ORDER — SODIUM CHLORIDE 0.9 % IV SOLN
200.0000 mg | Freq: Once | INTRAVENOUS | Status: AC
  Administered 2016-09-19: 200 mg via INTRAVENOUS
  Filled 2016-09-19: qty 200

## 2016-09-19 MED ORDER — METRONIDAZOLE IVPB CUSTOM
1.0000 g | Freq: Once | INTRAVENOUS | Status: AC
  Administered 2016-09-19: 13:00:00 1 g via INTRAVENOUS
  Filled 2016-09-19: qty 200

## 2016-09-19 MED ORDER — METRONIDAZOLE IN NACL 5-0.79 MG/ML-% IV SOLN
500.0000 mg | Freq: Three times a day (TID) | INTRAVENOUS | Status: DC
  Administered 2016-09-19 – 2016-09-25 (×17): 500 mg via INTRAVENOUS
  Filled 2016-09-19 (×20): qty 100

## 2016-09-19 MED ORDER — MEPERIDINE HCL 25 MG/ML IJ SOLN: 25.0000 mg | INTRAMUSCULAR | Status: DC | PRN

## 2016-09-19 MED ORDER — DEXTROSE 5 % IV BOLUS
10.0000 mL | INTRAVENOUS | Status: DC
  Administered 2016-09-19 – 2016-09-25 (×8): 10 mL via INTRAVENOUS
  Filled 2016-09-19: qty 25
  Filled 2016-09-19: qty 10

## 2016-09-19 MED ORDER — DEXAMETHASONE SODIUM PHOSPHATE 4 MG/ML IJ SOLN: 4.0000 mg | Freq: Four times a day (QID) | INTRAMUSCULAR | Status: DC

## 2016-09-19 MED ORDER — DIPHENHYDRAMINE HCL 50 MG/ML IJ SOLN: 25.0000 mg | Freq: Every day | INTRAMUSCULAR | Status: DC | PRN

## 2016-09-19 NOTE — Progress Notes (Signed)
Pharmacy Antibiotic Note  Derrick Fry is a 83 y.o. male from SNF on chronic methotrexate, admitted on 7/54/4920 with metabolic encephalopathy d/t sepsis and suspected UTI. 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 Pharmacy has been consulted for Vancomycin dosing. Eraxis, Cefepime, and Flagyl per MD.  Plan:  Vancomycin 1250 mg IV now, then 1000 mg IV q24 hr; goal trough 15-20 mcg/mL  Measure vancomycin trough levels at steady state as indicated Discussed dosing for other antibiotics/steroids below, and will adjust to the following:  Cefepime for UTI changed to Rocephin 2g IV q12 hr  Flagyl 1000 mg IV x 1 followed by 500 mg IV q8 hr  Eraxis 200 mg IV x 1 followed by 100 mg IV q24 hr  Decadron 10 mg IV x 1 followed by 4 mg IV q6 hr (recommend continuing only as long as mass effect or AMS present)   Height: 5\' 7"  (170.2 cm) Weight: 155 lb 3.2 oz (70.4 kg) IBW/kg (Calculated) : 66.1  Temp (24hrs), Avg:98.6 F (37 C), Min:98 F (36.7 C), Max:99.1 F (37.3 C)   Recent Labs Lab 09/17/16 1805 09/17/16 1807 09/18/16 0358 09/19/16 0358  WBC 15.9*  --  18.3* 13.3*  CREATININE 1.18  --  1.01 1.23  LATICACIDVEN  --  0.82  --   --     Estimated Creatinine Clearance: 39.6 mL/min (by C-G formula based on SCr of 1.23 mg/dL).    Allergies  Allergen Reactions  . Colchicine Other (See Comments)    Reaction:  Unknown     Antimicrobials this admission: Cefepime 7/15 >> 7/17 Vancomycin 7/17 >>  Rocephin 7/17 >> Flagyl 7/17 >> Eraxis 7/17 >>  Dose adjustments this admission: ---  Microbiology results: 7/3 MRSA PCR: neg 7/15BCx: sent 7/15 UCx: sent 7/16 UCx sent 7/16 MRSA PCR: neg  Thank you for allowing pharmacy to be a part of this patient's care.  Reuel Boom, PharmD, BCPS Pager: [phone] 09/19/2016, 10:07 AM

## 2016-09-19 NOTE — Progress Notes (Signed)
CSW met with pt / spouse at bedside to offer support. Spouse spoke with MD for medical update. PT eval will be needed when medically appropriate if rehab placement is to be considered. Pt has [employer] medicare which requires prior authorization for placement. DC plan is undetermined at this time. CSW will continue to follow to offer support and assistance with dc planning.  Werner Lean LCSW [phone]

## 2016-09-19 NOTE — Consult Note (Signed)
   [facility] Inpatient Consult   09/19/2016  JEIDEN DAUGHTRIDGE [birth year] [id number]   Screened for [place] [employer] program services on behalf of [employer] insurance.   Went to bedside to speak with patient, wife and daughter. Mr. Rodin was resting. Offered and discussed [place] Management services to wife.   Mrs. Mangieri and daughter indicated they are not sure what the discharge plan will be. States they are awaiting to speak with Infectious Disease MD.   Provided [facility] Care Management brochure with contact information and 24-hr nurse line magnet.  Will continue to follow and re-engage if [place] Management services are needed.   Spoke with inpatient RNCM regarding above.    Marthenia Rolling, MSN-Ed, RN,BSN [facility] Liaison [phone]

## 2016-09-19 NOTE — Progress Notes (Signed)
PROGRESS NOTE  Derrick Fry RSW:[id number] DOB: 08-21-1927 DOA: 09/17/2016 PCP: Colon Branch, MD  HPI/Recap of past 24 hours:  Fever subsided Seems more alert, does attempt to follow commandsistently, he does not talk Family at bedside  Assessment/Plan: Principal Problem:   Acute lower UTI Active Problems:   DM II (diabetes mellitus, type II), controlled ([place])   OSA (obstructive sleep apnea)   CAD (coronary artery disease)   CKD (chronic kidney disease), stage III   AAA (abdominal aortic aneurysm) (HCC)   Normocytic anemia   Pulmonary fibrosis (HCC)   Chronic systolic CHF (congestive heart failure) ([place])   UTI (urinary tract infection)  Sepsis presented on admission with metabolic encephalopathy and UTI in an immunosuppressed individual  -patient is sent from SNF due to fever 102.7, altered mental status,  leukocytosis wbc 15.9, lactic acid wnl 0.82 -blood culture and urine culture no growth,  MRSA screening negative. cxr on admission with "Chronic bibasilar reticular lung opacities, favor scarring or atelectasis." with h/o pulmonary fibrosis and bibasilar crackles on exam, patient does not have cough, no hypoxia. -methotrexate held since admission, he is stated on cefepime since admission, due to patient remain unresponsive on abx, brain imaging obtain which concerns for brain abscesses and Left ostiomeatal mucocele or possibly a fungal ball, I have discussed with result with patient's wife at bedside on 7/17 -I have discussed with neurosurgery Dr Annette Stable who reviewed patient's imaging and recommended patient is not a surgical candidate, he recommend iv abx, steroids for edema, ID consulted,  _ID consulted.    Oral thrush:  Topical nystatin   Acute on chronic systolic chf: -Last EF 09-38% two weeks ago -On exam he has bilateral lower extremity pitting edema -His blood pressure  And renal function is stable,  -pitting edema resolved on iv lasix, strict intake and  output -patient cr started to increase, will d/c daily lasix, he night need prn lasix according to volume status during hospitalization.    Hyponatremia: sodium 132-133, likely hypervolemic hyponatremia, on lasix  HTN: continue home bp meds  H/o AAA s/p repair, ? Descending thoracic aortic ulcer, he was evaluated by thoracic surgery during last hospitalization from 7/1-7/9, he is deemed not a surgical candidate, he was taken off xarelto.    H/o DVT/PE: off xarelto due to concerning of descending thoracic aortic ulcer.   Diabetes, likely steroid induced ( a1c 6.7 -6.9 last few years Am blood sugar 119 monitor  CKD III, cr stable, renal dosing meds  H/o pulmonary fibrosis ( chronic pulmonary bibasilar  Crackles per pmd), h/o psoriatic arthritis on Methotrexate, daily prednisone 2.5mg  Methotrexate held due to sepsis, prednisone continued  H/o gout: he is on uloric at home,  uric acid 3.  consider changed to allopurinol due to increased cardiac side effect from uloric. Currently he is npo, not on oral meds due to altered mental status   Code Status: DNR  Family Communication: patient , wife at bedside  Disposition Plan: not ready for discharge, expect return to SNF if he can improve, overall very poor prognosis, I have discussed with patient's wife at bedside.   Consultants:  Infectious disease  Neurosurgery Dr Annette Stable over the phone  Procedures:  none  Antibiotics:  cefepime   Objective: BP (!) 151/64 (BP Location: Left Arm) Comment: RN Notified  Pulse 74   Temp 98 F (36.7 C) (Oral)   Resp 16   Ht 5\' 7"  (1.702 m)   Wt 70.4 kg (155 lb 3.2 oz)  SpO2 99%   BMI 24.31 kg/m   Intake/Output Summary (Last 24 hours) at 09/19/16 0805 Last data filed at 09/19/16 0500  Gross per 24 hour  Intake              276 ml  Output             1850 ml  Net            -1574 ml   Filed Weights   09/17/16 1810 09/17/16 2333 09/19/16 0408  Weight: 70.8 kg (156 lb) 71.2 kg (157  lb) 70.4 kg (155 lb 3.2 oz)    Exam:   General:  Frail, chronically ill elderly male, very lethargic, nonverbal, Open eyes  to voices, does attempt to follow commands  Cardiovascular: RRR, occasionally ectopic beats  Respiratory: bibasilar crackles (report chronic),  No wheezing, no rhonchi  Abdomen: Soft/ND/NT, positive BS  Musculoskeletal: bilateral lower extremity pitting Edema has resolved  Neuro: very lethargic, nonverbal, Open eyes slightly  to voices   Data Reviewed: Basic Metabolic Panel:  Recent Labs Lab 09/17/16 1805 09/18/16 0358 09/19/16 0358  NA 133* 132* 132*  K 4.6 4.4 3.5  CL 103 104 101  CO2 21* 19* 20*  GLUCOSE 135* 119* 136*  BUN 24* 20 21*  CREATININE 1.18 1.01 1.23  CALCIUM 9.1 8.4* 8.6*  MG  --   --  1.7   Liver Function Tests:  Recent Labs Lab 09/17/16 1805  AST 38  ALT 34  ALKPHOS 67  BILITOT 0.8  PROT 5.6*  ALBUMIN 2.1*   No results for input(s): LIPASE, AMYLASE in the last 168 hours. No results for input(s): AMMONIA in the last 168 hours. CBC:  Recent Labs Lab 09/17/16 1805 09/18/16 0358 09/19/16 0358  WBC 15.9* 18.3* 13.3*  NEUTROABS 13.8* 15.9*  --   HGB 9.8* 10.2* 10.0*  HCT 29.2* 30.5* 29.2*  MCV 97.7 99.0 98.0  PLT 176 149* 147*   Cardiac Enzymes:   No results for input(s): CKTOTAL, CKMB, CKMBINDEX, TROPONINI in the last 168 hours. BNP (last 3 results) No results for input(s): BNP in the last 8760 hours.  ProBNP (last 3 results) No results for input(s): PROBNP in the last 8760 hours.  CBG: No results for input(s): GLUCAP in the last 168 hours.  Recent Results (from the past 240 hour(s))  Blood Culture (routine x 2)     Status: None (Preliminary result)   Collection Time: 09/17/16  6:04 PM  Result Value Ref Range Status   Specimen Description BLOOD LEFT WRIST  Final   Special Requests IN PEDIATRIC BOTTLE Blood Culture adequate volume  Final   Culture PENDING  Incomplete   Report Status PENDING  Incomplete   MRSA PCR Screening     Status: None   Collection Time: 09/18/16  1:30 PM  Result Value Ref Range Status   MRSA by PCR NEGATIVE NEGATIVE Final    Comment:        The GeneXpert MRSA Assay (FDA approved for NASAL specimens only), is one component of a comprehensive MRSA colonization surveillance program. It is not intended to diagnose MRSA infection nor to guide or monitor treatment for MRSA infections.      Studies: Ct Head Wo Contrast  Result Date: 09/18/2016 CLINICAL DATA:  Aphasia EXAM: CT HEAD WITHOUT CONTRAST TECHNIQUE: Contiguous axial images were obtained from the base of the skull through the vertex without intravenous contrast. COMPARISON:  August 08, 2005 FINDINGS: Brain: There is mild diffuse atrophy. There  is no intracranial mass, hemorrhage, extra-axial fluid collection, or midline shift. There is decreased attenuation in the mid left frontal lobe. There is a suggestion within this area of decreased attenuation of a focal area of decreased attenuation with surrounding rim of increased attenuation measuring 7 x 7 mm, best seen on axial slice 21 series 2. There is localized sulcal effacement in the superior left frontal lobe in the area of this lesion. Elsewhere, there is small vessel disease in the centra semiovale bilaterally as well as in the left external capsule. Vascular: There is no hyperdense vessel. There is calcification in each carotid siphon region. Skull: The bony calvarium appears intact. Sinuses/Orbits: There is opacification of the left maxillary antrum with extension of soft tissue material medial to the left maxillary antrum into the left nasal cavity with localized nares obstruction on the left. There is medial bowing of the medial wall of the left maxillary antrum. Elsewhere, there is opacification in superior left ethmoid air cells. There is mild mucosal thickening in the inferior left frontal sinus region. Orbits appear symmetric bilaterally. Other: Mastoid air cells  are clear. IMPRESSION: 1. Decreased attenuation in the mid superior left frontal lobe. Within this area, there is a questionable 7 x 7 mm focus of decreased attenuation with increased attenuation periphery. Question small mass or abscess with edema in this area. An acute infarct in this area is an alternative differential consideration cannot be excluded on this noncontrast enhanced study. Brain MRI pre and post-contrast administration could be most helpful for further delineation in this regard. 2. Elsewhere, there is atrophy with patchy periventricular small vessel disease and small vessel disease throughout the left external capsule. No hemorrhage evident. 3. Areas of paranasal sinus disease. Extensive opacification in the left maxillary antrum with extension medially into the left nasal cavity with partial obstruction of the left nasal cavity. Suspect antrochoanal polyp with mucocele in this area. Areas of paranasal sinus disease also noted in the left anterior ethmoid and inferior left frontal regions. ENT evaluation may well be warranted given the changes arising from the left maxillary antrum. 4.  Areas of arterial vascular calcification noted. These results will be called to the ordering clinician or representative by the Radiologist Assistant, and communication documented in the PACS or zVision Dashboard. Electronically Signed   By: Lowella Grip III M.D.   On: 09/18/2016 15:25   Mr Jeri Cos ZO Contrast  Result Date: 09/18/2016 CLINICAL DATA:  83 y/o M; fever and altered mental status. Concern for brain abscess. EXAM: MRI HEAD WITHOUT AND WITH CONTRAST TECHNIQUE: Multiplanar, multiecho pulse sequences of the brain and surrounding structures were obtained without and with intravenous contrast. CONTRAST:  5mL MULTIHANCE GADOBENATE DIMEGLUMINE 529 MG/ML IV SOLN COMPARISON:  09/18/2016 CT head FINDINGS: Brain: Rim enhancing lesions with central reduced effusion within the left lentiform nucleus, caudate  head, anterior corona radiata, extending into the left frontal lobe. The largest measures 26 x 18 x 14 mm (AP x ML x CC series 11, image 36 and series 12, image 17). There is a single additional subcentimeter focus within the left lateral temporal lobe (series 11, image 20). There is associated surrounding T2 hyperintense signal abnormality in white matter and local mass effect with partial effacement of frontal horn of left lateral ventricle and 5 mm left-to-right midline shift. Background of mild chronic microvascular ischemic changes and moderate parenchymal volume loss of the brain. No hydrocephalus. No herniation. Small chronic infarction in the right cerebellar hemisphere. Vascular: Suspected 5 mm  aneurysm of the right MCA bifurcation in the MCA cistern (series 6, image 10). Skull and upper cervical spine: Normal marrow signal. Sinuses/Orbits: Left maxillary and anterior ethmoid sinus opacification with a low signal nonenhancing lesion centered in the left ostiomeatal unit which may represent a inspissated mucocele or fungal ball. Bilateral intra-ocular lens replacement. Other: None. IMPRESSION: 1. Rim enhancing lesion with central reduced diffusion in the left frontal lobe consistent with abscess measuring up to 26 mm. Multiple additional subcentimeter abscesses throughout the left frontal lobe and left basal ganglia. Single subcentimeter abscess and left lateral temporal lobe. 2. Edema and associated mass effect results in partial effacement of frontal horn of left lateral ventricle and 5 mm of left-to-right midline shift. 3. 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 4. Left ostiomeatal unit nonenhancing low signal lesion may represent mucocele or possibly a fungal ball. Direct visualization recommended. These results will be called to the ordering clinician or representative by the Radiologist Assistant, and communication documented in the PACS or zVision Dashboard. Electronically Signed   By: Kristine Garbe M.D.   On: 09/18/2016 18:36    Scheduled Meds: . furosemide  40 mg Intravenous Daily  . hydrocortisone sod succinate (SOLU-CORTEF) inj  50 mg Intravenous Q8H  . metoprolol tartrate  2.5 mg Intravenous Q6H  . nystatin  5 mL Oral QID  . sodium chloride flush  3 mL Intravenous Q12H  . cyanocobalamin  100 mcg Oral Daily    Continuous Infusions: . sodium chloride    . ceFEPime (MAXIPIME) IV Stopped (09/18/16 2035)     Time spent: >85mins  Giuliana Handyside MD, PhD  [facility] Pager [phone]. If 7PM-7AM, please contact night-coverage at [email or web address], password [facility] 09/19/2016, 8:05 AM  LOS: 2 days

## 2016-09-19 NOTE — Consult Note (Signed)
[facility]  Date of Admission:  09/17/2016  Date of Consult:  09/19/2016  Reason for Consult:Brain Abscess Referring Physician: Erlinda Hong  Impression/Recommendation Brain Abscess Fungal ball, L sinus  Dementia  Would:  Consider neurosurgery eval/comment Consider ENT eval/comment, sinus debridement, Cx.  Would change anidulafungin to lipid ampho  Will give better mucor coverage with his immunosuppression.   This is not without risk given his hx of CKD 3  Appreciate pharm assistance with this.  His anbx for brain abscess are correct, could change to imipenem to decrease overall doses. Will leave as is for now.   I attempted to call his wife at number listed in Dr Ethel Rana note. [phone].  No answer.   Thank you so much for this interesting consult,   Bobby Rumpf (pager) [phone] [email or web address]  Derrick Fry is an 83 y.o. male.  HPI: 83 yo M with hx of AAA with repair (1996), diet controlled DM, CKD 3, adm on 7-16 with worsening confusion. He was adm 7-1 to 7-9 after eval for chest pain that was felt to be due to an ulcer in a thoracic aortic plaque. He was not felt to be a surgical candidate.  Prior to adm he was started on levaquin for a CXR that was concerning for pneumonia.  In ED he was febrile (102.7) and had WBC 15.9.  He was started on vancomycin and cefepime.  He was felt to have UTI, his CXR showed chronic changes.  Due to his confusion, he had MRI head: 1. Rim enhancing lesion with central reduced diffusion in the left frontal lobe consistent with abscess measuring up to 26 mm. Multiple additional subcentimeter abscesses throughout the left frontal lobe and left basal ganglia. Single subcentimeter abscess and left lateral temporal lobe. 2. Edema and associated mass effect results in partial effacement of frontal horn of left lateral ventricle and 5 mm of left-to-right midline shift. 3. Suspected 5 mm aneurysm of right  MCA bifurcation. 4. Left ostiomeatal unit nonenhancing low signal lesion may represent mucocele or possibly a fungal ball. Direct visualization recommended.  His hx is also notable for chronic prednisone 2.73m daily and methotrexate use.   Past Medical History:  Diagnosis Date  .  peripheral neuropathy --UDS--pain mngmt  09/05/2006   Qualifier: Diagnosis of  By: PLarose KellsMD, JNorth ApolloAAA (abdominal aortic aneurysm) ([place]   . Anemia   . Aortic dissection ([place] 09/03/2016  . Arrhythmia 02/09/2016  . Arthritis    "all over" (02/10/2016)  . Basal cell carcinoma of face    "burned off" (02/10/2016)  . CAD (coronary artery disease)    MI [birth year]  . Chronic lower back pain   . Decreased cardiac ejection fraction 09/04/2016   EF 35%  . Diabetes mellitus with neuropathy ([place]    "borderline" (02/10/2016)  . DM II (diabetes mellitus, type II), controlled ([place] 09/05/2006   Qualifier: Diagnosis of  By: PLarose KellsMD, JGreenbushDVT (deep venous thrombosis) ([place] 02/2016   "left thigh"  . Essential hypertension 09/05/2006   Qualifier: Diagnosis of  By: PLarose KellsMD, JBroeck PointeGait abnormality    chronic imbalance  . GAIT DISTURBANCE 08/22/2007   Qualifier: Diagnosis of  By: PLarose KellsMD, JBrownsvilleGout    diskitis 10/2008, Dr AOuida Sills . Gout 08/22/2007   Gout diagnosed 10-2008. he had a  L4 L5-S1 gout diskitis DEXA neg 3-09 and 08-2009   .  Heart murmur dx'd 02/2016  . High cholesterol 03/23/2009   Qualifier: Diagnosis of  By: Larose Kells MD, [place] History of DVT (deep vein thrombosis) 02/09/2016  . Hyperlipidemia   . Hypertension   . Myocardial infarction ([place]) 1987   "before OHS"  . OSA (obstructive sleep apnea)    Limited CPAP tolerance (02/10/2016)  . Osteoarthritis   . Pneumonia 1938   had right pneumonia pleurisy requiring resection of ribs and chest tube drainage at age 89  . Pulmonary embolism ([place]) 02/2016   "left"  . Pulmonary fibrosis ([place]) 01/29/2013  . Renal insufficiency    chronic w/ solitary  kidney, congenital  . RENAL INSUFFICIENCY, CHRONIC 09/05/2006   Qualifier: Diagnosis of  By: Larose Kells MD, [place] SLEEP APNEA 09/05/2006   History of a sleep study in 2006 by Dr. Annamaria Boots    . Small bowel obstruction ([place]) 04/01/2012  . SOLITARY KIDNEY, CONGENITAL 09/05/2006   Qualifier: Diagnosis of  By: Larose Kells MD, [place]     Past Surgical History:  Procedure Laterality Date  . ABDOMINAL AORTIC ANEURYSM REPAIR  ~ 1996   w/ iliac aneurysm repair i  . APPENDECTOMY    . CARDIAC CATHETERIZATION  1987   "before OHS"  . CARDIOVASCULAR STRESS TEST  10/13/2009   EF 57%  . CATARACT EXTRACTION W/ INTRAOCULAR LENS  IMPLANT, BILATERAL  09/1999,04/2003   right,left  . COLONOSCOPY    . CORONARY ARTERY BYPASS GRAFT  01/04/1986   CABG X5  . ESOPHAGOGASTRODUODENOSCOPY (EGD) WITH PROPOFOL N/A 02/22/2016   Procedure: ESOPHAGOGASTRODUODENOSCOPY (EGD) WITH PROPOFOL;  Surgeon: Milus Banister, MD;  Location: [facility];  Service: Endoscopy;  Laterality: N/A;  . INGUINAL HERNIA REPAIR Left 03/07/1983  . JOINT REPLACEMENT    . Knuckles replaced  05/2005   left hand  . NEPHRECTOMY Left 1996  . TONSILLECTOMY    . TOTAL KNEE ARTHROPLASTY Left 05/04/1989  . US ECHOCARDIOGRAPHY  01/14/2007   EF 55-60%     Allergies  Allergen Reactions  . Colchicine Other (See Comments)    Reaction:  Unknown     Medications:  Scheduled: . dexamethasone  4 mg Intravenous Q6H  . furosemide  40 mg Intravenous Daily  . metoprolol tartrate  2.5 mg Intravenous Q6H  . nystatin  5 mL Oral QID  . sodium chloride flush  3 mL Intravenous Q12H  . cyanocobalamin  100 mcg Oral Daily    Abtx:  Anti-infectives    Start     Dose/Rate Route Frequency Ordered Stop   09/20/16 1000  anidulafungin (ERAXIS) 100 mg in sodium chloride 0.9 % 100 mL IVPB     100 mg 78 mL/hr over 100 Minutes Intravenous Every 24 hours 09/19/16 0842     09/20/16 0800  vancomycin (VANCOCIN) IVPB 1000 mg/200 mL premix     1,000 mg 200 mL/hr over 60 Minutes  Intravenous Every 24 hours 09/19/16 0842     09/19/16 1800  metroNIDAZOLE (FLAGYL) IVPB 500 mg     500 mg 100 mL/hr over 60 Minutes Intravenous Every 8 hours 09/19/16 0902     09/19/16 1000  anidulafungin (ERAXIS) 100 mg in sodium chloride 0.9 % 100 mL IVPB  Status:  Discontinued     100 mg 78 mL/hr over 100 Minutes Intravenous Every 24 hours 09/19/16 0811 09/19/16 0842   09/19/16 1000  metroNIDAZOLE (FLAGYL) IVPB 1 g     1 g 200 mL/hr over 60 Minutes Intravenous  Once  09/19/16 0902 09/19/16 1401   09/19/16 1000  cefTRIAXone (ROCEPHIN) 2 g in dextrose 5 % 50 mL IVPB     2 g 100 mL/hr over 30 Minutes Intravenous Every 12 hours 09/19/16 0902     09/19/16 0900  vancomycin (VANCOCIN) 1,250 mg in sodium chloride 0.9 % 250 mL IVPB     1,250 mg 166.7 mL/hr over 90 Minutes Intravenous  Once 09/19/16 0842 09/19/16 1035   09/19/16 0900  anidulafungin (ERAXIS) 200 mg in sodium chloride 0.9 % 200 mL IVPB     200 mg 78 mL/hr over 200 Minutes Intravenous  Once 09/19/16 0842 09/19/16 1442   09/18/16 0800  ceFEPIme (MAXIPIME) 1 g in dextrose 5 % 50 mL IVPB  Status:  Discontinued     1 g 100 mL/hr over 30 Minutes Intravenous Every 12 hours 09/17/16 1949 09/17/16 2019   09/18/16 0800  ceFEPIme (MAXIPIME) 1 g in dextrose 5 % 50 mL IVPB  Status:  Discontinued     1 g 100 mL/hr over 30 Minutes Intravenous Every 12 hours 09/17/16 2035 09/19/16 0902   09/18/16 0600  vancomycin (VANCOCIN) IVPB 750 mg/150 ml premix  Status:  Discontinued     750 mg 150 mL/hr over 60 Minutes Intravenous Every 12 hours 09/17/16 1949 09/17/16 2019   09/17/16 1800  ceFEPIme (MAXIPIME) 2 g in dextrose 5 % 50 mL IVPB     2 g 100 mL/hr over 30 Minutes Intravenous  Once 09/17/16 1748 09/17/16 1916   09/17/16 1800  vancomycin (VANCOCIN) IVPB 1000 mg/200 mL premix     1,000 mg 200 mL/hr over 60 Minutes Intravenous  Once 09/17/16 1748 09/17/16 1925      Total days of antibiotics: cefepime/vanco 3     Flagyl, anidulafungin day  0          Social History:  reports that he quit smoking about 44 years ago. His smoking use included Cigarettes. He has a 30.00 pack-year smoking history. He has never used smokeless tobacco. He reports that he does not drink alcohol or use drugs.  Family History  Problem Relation Age of Onset  . Heart disease Father   . Lymphoma Sister   . Liver cancer Brother   . Lung cancer Brother   . Ulcerative colitis Brother   . Heart disease Brother   . Diabetes Brother   . Colon cancer Neg Hx   . Prostate cancer Neg Hx   . Stomach cancer Neg Hx   . Esophageal cancer Neg Hx   . Rectal cancer Neg Hx     History obtained from chart review, the patient and unobtainable from patient due to mental status General ROS: denies headaches, pain in sinuses, see HPI.   Blood pressure 140/88, pulse (!) 109, temperature 98 F (36.7 C), temperature source Oral, resp. rate 16, height _0  (1.702 m), weight 70.4 kg (155 lb 3.2 oz), SpO2 98 %. General appearance: alert, no distress and slowed mentation Eyes: negative findings: pupils equal, round, reactive to light and accomodation and no tender, no proptosis Throat: abnormal findings: thrush and mild Neck: no adenopathy and supple, symmetrical, trachea midline Lungs: clear to auscultation bilaterally Heart: irregularly irregular rhythm Abdomen: normal findings: bowel sounds normal and soft, non-tender Extremities: edema anasarca BLE and able to move toes to command.    Results for orders placed or performed during the hospital encounter of 09/17/16 (from the past 48 hour(s))  Urinalysis, Routine w reflex microscopic     Status: Abnormal  Collection Time: 09/17/16  5:34 PM  Result Value Ref Range   Color, Urine YELLOW YELLOW   APPearance HAZY (A) CLEAR   Specific Gravity, Urine 1.026 1.005 - 1.030   pH 6.0 5.0 - 8.0   Glucose, UA NEGATIVE NEGATIVE mg/dL   Hgb urine dipstick NEGATIVE NEGATIVE   Bilirubin Urine NEGATIVE NEGATIVE   Ketones, ur 20  (A) NEGATIVE mg/dL   Protein, ur 30 (A) NEGATIVE mg/dL   Nitrite NEGATIVE NEGATIVE   Leukocytes, UA TRACE (A) NEGATIVE   RBC / HPF 0-5 0 - 5 RBC/hpf   WBC, UA TOO NUMEROUS TO COUNT 0 - 5 WBC/hpf   Bacteria, UA FEW (A) NONE SEEN   Squamous Epithelial / LPF 0-5 (A) NONE SEEN   Mucous PRESENT    Hyaline Casts, UA PRESENT   Blood Culture (routine x 2)     Status: None (Preliminary result)   Collection Time: 09/17/16  6:04 PM  Result Value Ref Range   Specimen Description BLOOD LEFT WRIST    Special Requests IN PEDIATRIC BOTTLE Blood Culture adequate volume    Culture      NO GROWTH 1 DAY Performed at [facility], [place] [address], [place], [place] [address]    Report Status PENDING   Comprehensive metabolic panel     Status: Abnormal   Collection Time: 09/17/16  6:05 PM  Result Value Ref Range   Sodium 133 (L) 135 - 145 mmol/L   Potassium 4.6 3.5 - 5.1 mmol/L   Chloride 103 101 - 111 mmol/L   CO2 21 (L) 22 - 32 mmol/L   Glucose, Bld 135 (H) 65 - 99 mg/dL   BUN 24 (H) 6 - 20 mg/dL   Creatinine, Ser 1.18 0.61 - 1.24 mg/dL   Calcium 9.1 8.9 - 10.3 mg/dL   Total Protein 5.6 (L) 6.5 - 8.1 g/dL   Albumin 2.1 (L) 3.5 - 5.0 g/dL   AST 38 15 - 41 U/L   ALT 34 17 - 63 U/L   Alkaline Phosphatase 67 38 - 126 U/L   Total Bilirubin 0.8 0.3 - 1.2 mg/dL   GFR calc non Af Amer 54 (L) >60 mL/min   GFR calc Af Amer >60 >60 mL/min    Comment: (NOTE) The eGFR has been calculated using the CKD EPI equation. This calculation has not been validated in all clinical situations. eGFR's persistently <60 mL/min signify possible Chronic Kidney Disease.    Anion gap 9 5 - 15  CBC WITH DIFFERENTIAL     Status: Abnormal   Collection Time: 09/17/16  6:05 PM  Result Value Ref Range   WBC 15.9 (H) 4.0 - 10.5 K/uL   RBC 2.99 (L) 4.22 - 5.81 MIL/uL   Hemoglobin 9.8 (L) 13.0 - 17.0 g/dL   HCT 29.2 (L) 39.0 - 52.0 %   MCV 97.7 78.0 - 100.0 fL   MCH 32.8 26.0 - 34.0 pg   MCHC 33.6 30.0 - 36.0 g/dL    RDW 16.2 (H) 11.5 - 15.5 %   Platelets 176 150 - 400 K/uL   Neutrophils Relative % 86 %   Neutro Abs 13.8 (H) 1.7 - 7.7 K/uL   Lymphocytes Relative 7 %   Lymphs Abs 1.1 0.7 - 4.0 K/uL   Monocytes Relative 7 %   Monocytes Absolute 1.0 0.1 - 1.0 K/uL   Eosinophils Relative 0 %   Eosinophils Absolute 0.0 0.0 - 0.7 K/uL   Basophils Relative 0 %   Basophils  Absolute 0.0 0.0 - 0.1 K/uL  Blood Culture (routine x 2)     Status: None (Preliminary result)   Collection Time: 09/17/16  6:05 PM  Result Value Ref Range   Specimen Description BLOOD RIGHT ARM    Special Requests      BOTTLES DRAWN AEROBIC AND ANAEROBIC Blood Culture adequate volume   Culture      NO GROWTH 1 DAY Performed at [facility], [place] [address], [place], [place] [address]    Report Status PENDING   I-Stat CG4 Lactic Acid, ED  (not at  [facility])     Status: None   Collection Time: 09/17/16  6:07 PM  Result Value Ref Range   Lactic Acid, Venous 0.82 0.5 - 1.9 mmol/L  Basic metabolic panel     Status: Abnormal   Collection Time: 09/18/16  3:58 AM  Result Value Ref Range   Sodium 132 (L) 135 - 145 mmol/L   Potassium 4.4 3.5 - 5.1 mmol/L   Chloride 104 101 - 111 mmol/L   CO2 19 (L) 22 - 32 mmol/L   Glucose, Bld 119 (H) 65 - 99 mg/dL   BUN 20 6 - 20 mg/dL   Creatinine, Ser 1.01 0.61 - 1.24 mg/dL   Calcium 8.4 (L) 8.9 - 10.3 mg/dL   GFR calc non Af Amer >60 >60 mL/min   GFR calc Af Amer >60 >60 mL/min    Comment: (NOTE) The eGFR has been calculated using the CKD EPI equation. This calculation has not been validated in all clinical situations. eGFR's persistently <60 mL/min signify possible Chronic Kidney Disease.    Anion gap 9 5 - 15  CBC WITH DIFFERENTIAL     Status: Abnormal   Collection Time: 09/18/16  3:58 AM  Result Value Ref Range   WBC 18.3 (H) 4.0 - 10.5 K/uL   RBC 3.08 (L) 4.22 - 5.81 MIL/uL   Hemoglobin 10.2 (L) 13.0 - 17.0 g/dL   HCT 30.5 (L) 39.0 - 52.0 %   MCV 99.0 78.0 - 100.0 fL   MCH 33.1  26.0 - 34.0 pg   MCHC 33.4 30.0 - 36.0 g/dL   RDW 16.4 (H) 11.5 - 15.5 %   Platelets 149 (L) 150 - 400 K/uL   Neutrophils Relative % 87 %   Neutro Abs 15.9 (H) 1.7 - 7.7 K/uL   Lymphocytes Relative 8 %   Lymphs Abs 1.5 0.7 - 4.0 K/uL   Monocytes Relative 5 %   Monocytes Absolute 0.9 0.1 - 1.0 K/uL   Eosinophils Relative 0 %   Eosinophils Absolute 0.0 0.0 - 0.7 K/uL   Basophils Relative 0 %   Basophils Absolute 0.0 0.0 - 0.1 K/uL  Culture, Urine     Status: None   Collection Time: 09/18/16 12:00 PM  Result Value Ref Range   Specimen Description URINE, RANDOM    Special Requests      can use urine sample in lab if still suitable for testing, otherwise collect fresh urine sample.   Culture      NO GROWTH Performed at [facility], [place] [address], [place], [place] [address]    Report Status 09/19/2016 FINAL   MRSA PCR Screening     Status: None   Collection Time: 09/18/16  1:30 PM  Result Value Ref Range   MRSA by PCR NEGATIVE NEGATIVE    Comment:        The GeneXpert MRSA Assay (FDA approved for NASAL specimens only),  is one component of a comprehensive MRSA colonization surveillance program. It is not intended to diagnose MRSA infection nor to guide or monitor treatment for MRSA infections.   CBC     Status: Abnormal   Collection Time: 09/19/16  3:58 AM  Result Value Ref Range   WBC 13.3 (H) 4.0 - 10.5 K/uL   RBC 2.98 (L) 4.22 - 5.81 MIL/uL   Hemoglobin 10.0 (L) 13.0 - 17.0 g/dL   HCT 29.2 (L) 39.0 - 52.0 %   MCV 98.0 78.0 - 100.0 fL   MCH 33.6 26.0 - 34.0 pg   MCHC 34.2 30.0 - 36.0 g/dL   RDW 16.3 (H) 11.5 - 15.5 %   Platelets 147 (L) 150 - 400 K/uL  Basic metabolic panel     Status: Abnormal   Collection Time: 09/19/16  3:58 AM  Result Value Ref Range   Sodium 132 (L) 135 - 145 mmol/L   Potassium 3.5 3.5 - 5.1 mmol/L    Comment: DELTA CHECK NOTED   Chloride 101 101 - 111 mmol/L   CO2 20 (L) 22 - 32 mmol/L   Glucose, Bld 136 (H) 65 - 99 mg/dL   BUN 21  (H) 6 - 20 mg/dL   Creatinine, Ser 1.23 0.61 - 1.24 mg/dL   Calcium 8.6 (L) 8.9 - 10.3 mg/dL   GFR calc non Af Amer 51 (L) >60 mL/min   GFR calc Af Amer 59 (L) >60 mL/min    Comment: (NOTE) The eGFR has been calculated using the CKD EPI equation. This calculation has not been validated in all clinical situations. eGFR's persistently <60 mL/min signify possible Chronic Kidney Disease.    Anion gap 11 5 - 15  Magnesium     Status: None   Collection Time: 09/19/16  3:58 AM  Result Value Ref Range   Magnesium 1.7 1.7 - 2.4 mg/dL  Uric acid     Status: Abnormal   Collection Time: 09/19/16  3:58 AM  Result Value Ref Range   Uric Acid, Serum 3.0 (L) 4.4 - 7.6 mg/dL      Component Value Date/Time   SDES URINE, RANDOM 09/18/2016 1200   SPECREQUEST  09/18/2016 1200    can use urine sample in lab if still suitable for testing, otherwise collect fresh urine sample.   CULT  09/18/2016 1200    NO GROWTH Performed at [facility], [place] [address], [place], [place] [address]    REPTSTATUS 09/19/2016 FINAL 09/18/2016 1200   Ct Head Wo Contrast  Result Date: 09/18/2016 CLINICAL DATA:  Aphasia EXAM: CT HEAD WITHOUT CONTRAST TECHNIQUE: Contiguous axial images were obtained from the base of the skull through the vertex without intravenous contrast. COMPARISON:  August 08, 2005 FINDINGS: Brain: There is mild diffuse atrophy. There is no intracranial mass, hemorrhage, extra-axial fluid collection, or midline shift. There is decreased attenuation in the mid left frontal lobe. There is a suggestion within this area of decreased attenuation of a focal area of decreased attenuation with surrounding rim of increased attenuation measuring 7 x 7 mm, best seen on axial slice 21 series 2. There is localized sulcal effacement in the superior left frontal lobe in the area of this lesion. Elsewhere, there is small vessel disease in the centra semiovale bilaterally as well as in the left external capsule. Vascular:  There is no hyperdense vessel. There is calcification in each carotid siphon region. Skull: The bony calvarium appears intact. Sinuses/Orbits: There is opacification of the left maxillary antrum  with extension of soft tissue material medial to the left maxillary antrum into the left nasal cavity with localized nares obstruction on the left. There is medial bowing of the medial wall of the left maxillary antrum. Elsewhere, there is opacification in superior left ethmoid air cells. There is mild mucosal thickening in the inferior left frontal sinus region. Orbits appear symmetric bilaterally. Other: Mastoid air cells are clear. IMPRESSION: 1. Decreased attenuation in the mid superior left frontal lobe. Within this area, there is a questionable 7 x 7 mm focus of decreased attenuation with increased attenuation periphery. Question small mass or abscess with edema in this area. An acute infarct in this area is an alternative differential consideration cannot be excluded on this noncontrast enhanced study. Brain MRI pre and post-contrast administration could be most helpful for further delineation in this regard. 2. Elsewhere, there is atrophy with patchy periventricular small vessel disease and small vessel disease throughout the left external capsule. No hemorrhage evident. 3. Areas of paranasal sinus disease. Extensive opacification in the left maxillary antrum with extension medially into the left nasal cavity with partial obstruction of the left nasal cavity. Suspect antrochoanal polyp with mucocele in this area. Areas of paranasal sinus disease also noted in the left anterior ethmoid and inferior left frontal regions. ENT evaluation may well be warranted given the changes arising from the left maxillary antrum. 4.  Areas of arterial vascular calcification noted. These results will be called to the ordering clinician or representative by the Radiologist Assistant, and communication documented in the PACS or zVision  Dashboard. Electronically Signed   By: Lowella Grip III M.D.   On: 09/18/2016 15:25   Mr Jeri Cos DX Contrast  Result Date: 09/18/2016 CLINICAL DATA:  83 y/o M; fever and altered mental status. Concern for brain abscess. EXAM: MRI HEAD WITHOUT AND WITH CONTRAST TECHNIQUE: Multiplanar, multiecho pulse sequences of the brain and surrounding structures were obtained without and with intravenous contrast. CONTRAST:  96m MULTIHANCE GADOBENATE DIMEGLUMINE 529 MG/ML IV SOLN COMPARISON:  09/18/2016 CT head FINDINGS: Brain: Rim enhancing lesions with central reduced effusion within the left lentiform nucleus, caudate head, anterior corona radiata, extending into the left frontal lobe. The largest measures 26 x 18 x 14 mm (AP x ML x CC series 11, image 36 and series 12, image 17). There is a single additional subcentimeter focus within the left lateral temporal lobe (series 11, image 20). There is associated surrounding T2 hyperintense signal abnormality in white matter and local mass effect with partial effacement of frontal horn of left lateral ventricle and 5 mm left-to-right midline shift. Background of mild chronic microvascular ischemic changes and moderate parenchymal volume loss of the brain. No hydrocephalus. No herniation. Small chronic infarction in the right cerebellar hemisphere. 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 Skull and upper cervical spine: Normal marrow signal. Sinuses/Orbits: Left maxillary and anterior ethmoid sinus opacification with a low signal nonenhancing lesion centered in the left ostiomeatal unit which may represent a inspissated mucocele or fungal ball. Bilateral intra-ocular lens replacement. Other: None. IMPRESSION: 1. Rim enhancing lesion with central reduced diffusion in the left frontal lobe consistent with abscess measuring up to 26 mm. Multiple additional subcentimeter abscesses throughout the left frontal lobe  and left basal ganglia. Single subcentimeter abscess and left lateral temporal lobe. 2. Edema and associated mass effect results in partial effacement of frontal horn of left lateral ventricle and 5 mm of left-to-right  midline shift. 3. 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 4. Left ostiomeatal unit nonenhancing low signal lesion may represent mucocele or possibly a fungal ball. Direct visualization recommended. These results will be called to the ordering clinician or representative by the Radiologist Assistant, and communication documented in the PACS or zVision Dashboard. Electronically Signed   By: Kristine Garbe M.D.   On: 09/18/2016 18:36   Dg Chest Port 1 View  Result Date: 09/17/2016 CLINICAL DATA:  Cough EXAM: PORTABLE CHEST 1 VIEW COMPARISON:  09/04/2016 chest radiograph FINDINGS: Intact sternotomy wires. CABG clips overlie the mediastinum. Stable cardiomediastinal silhouette with mild cardiomegaly and aortic atherosclerosis. No pneumothorax. No pleural effusion. Cephalization of the pulmonary vasculature without overt pulmonary edema. Stable reticular opacities at both lung bases. IMPRESSION: 1. Stable mild cardiomegaly without overt pulmonary edema. 2. Chronic bibasilar reticular lung opacities, favor scarring or atelectasis. Electronically Signed   By: Ilona Sorrel M.D.   On: 09/17/2016 18:02   Recent Results (from the past 240 hour(s))  Blood Culture (routine x 2)     Status: None (Preliminary result)   Collection Time: 09/17/16  6:04 PM  Result Value Ref Range Status   Specimen Description BLOOD LEFT WRIST  Final   Special Requests IN PEDIATRIC BOTTLE Blood Culture adequate volume  Final   Culture   Final    NO GROWTH 1 DAY Performed at [facility], [place] [address], [place], [place] [address]    Report Status PENDING  Incomplete  Blood Culture (routine x 2)     Status: None (Preliminary result)   Collection Time: 09/17/16  6:05 PM  Result Value Ref Range  Status   Specimen Description BLOOD RIGHT ARM  Final   Special Requests   Final    BOTTLES DRAWN AEROBIC AND ANAEROBIC Blood Culture adequate volume   Culture   Final    NO GROWTH 1 DAY Performed at [facility], [place] [address] [place], [place] [address]    Report Status PENDING  Incomplete  Culture, Urine     Status: None   Collection Time: 09/18/16 12:00 PM  Result Value Ref Range Status   Specimen Description URINE, RANDOM  Final   Special Requests   Final    can use urine sample in lab if still suitable for testing, otherwise collect fresh urine sample.   Culture   Final    NO GROWTH Performed at [facility], [place] [address] [place], [place] [address]    Report Status 09/19/2016 FINAL  Final  MRSA PCR Screening     Status: None   Collection Time: 09/18/16  1:30 PM  Result Value Ref Range Status   MRSA by PCR NEGATIVE NEGATIVE Final    Comment:        The GeneXpert MRSA Assay (FDA approved for NASAL specimens only), is one component of a comprehensive MRSA colonization surveillance program. It is not intended to diagnose MRSA infection nor to guide or monitor treatment for MRSA infections.       09/19/2016, 5:13 PM     LOS: 2 days    Records and images were personally reviewed where available.

## 2016-09-19 NOTE — Care Management Note (Signed)
Case Management Note  Patient Details  Name: Derrick Fry MRN: [id number] Date of Birth: Mar 27, 1927  Subjective/Objective:     83 yo admitted with Acute lower UTI.               Action/Plan: From [facility]. CSW assisting with DC planning. CM will assist as needed.  Expected Discharge Date:   (UNKNOWN)               Expected Discharge Plan:  Skilled Nursing Facility  In-House Referral:  Clinical Social Work  Discharge planning Services  CM Consult  Post Acute Care Choice:    Choice offered to:     DME Arranged:    DME Agency:     HH Arranged:    [place] Agency:     Status of Service:  In process, will continue to follow  If discussed at Long Length of Stay Meetings, dates discussed:    Additional CommentsLynnell Catalan, RN 09/19/2016, 11:20 AM  [phone]

## 2016-09-20 DIAGNOSIS — G060 Intracranial abscess and granuloma: Secondary | ICD-10-CM

## 2016-09-20 DIAGNOSIS — G9341 Metabolic encephalopathy: Secondary | ICD-10-CM

## 2016-09-20 DIAGNOSIS — B49 Unspecified mycosis: Secondary | ICD-10-CM

## 2016-09-20 LAB — URINE CULTURE: Culture: NO GROWTH

## 2016-09-20 LAB — CBC
HCT: 30.5 % — ABNORMAL LOW (ref 39.0–52.0)
HEMOGLOBIN: 10.5 g/dL — AB (ref 13.0–17.0)
MCH: 32.7 pg (ref 26.0–34.0)
MCHC: 34.4 g/dL (ref 30.0–36.0)
MCV: 95 fL (ref 78.0–100.0)
Platelets: 165 10*3/uL (ref 150–400)
RBC: 3.21 MIL/uL — AB (ref 4.22–5.81)
RDW: 15.8 % — ABNORMAL HIGH (ref 11.5–15.5)
WBC: 14.4 10*3/uL — ABNORMAL HIGH (ref 4.0–10.5)

## 2016-09-20 LAB — BASIC METABOLIC PANEL
Anion gap: 10 (ref 5–15)
BUN: 27 mg/dL — ABNORMAL HIGH (ref 6–20)
CHLORIDE: 100 mmol/L — AB (ref 101–111)
CO2: 25 mmol/L (ref 22–32)
CREATININE: 1.11 mg/dL (ref 0.61–1.24)
Calcium: 8.4 mg/dL — ABNORMAL LOW (ref 8.9–10.3)
GFR, EST NON AFRICAN AMERICAN: 58 mL/min — AB (ref >60)
Glucose, Bld: 248 mg/dL — ABNORMAL HIGH (ref 65–99)
Potassium: 3.3 mmol/L — ABNORMAL LOW (ref 3.5–5.1)
SODIUM: 135 mmol/L (ref 135–145)

## 2016-09-20 LAB — MAGNESIUM: MAGNESIUM: 1.8 mg/dL (ref 1.7–2.4)

## 2016-09-20 LAB — GLUCOSE, CAPILLARY: GLUCOSE-CAPILLARY: 279 mg/dL — AB (ref 65–99)

## 2016-09-20 MED ORDER — LIP MEDEX EX OINT
TOPICAL_OINTMENT | CUTANEOUS | Status: AC
  Administered 2016-09-20: 09:00:00
  Filled 2016-09-20: qty 7

## 2016-09-20 MED ORDER — POTASSIUM CHLORIDE CRYS ER 20 MEQ PO TBCR: 40.0000 meq | EXTENDED_RELEASE_TABLET | Freq: Once | ORAL | Status: DC

## 2016-09-20 MED ORDER — POTASSIUM CHLORIDE 10 MEQ/100ML IV SOLN
10.0000 meq | INTRAVENOUS | Status: AC
  Administered 2016-09-20 (×4): 10 meq via INTRAVENOUS
  Filled 2016-09-20 (×4): qty 100

## 2016-09-20 NOTE — Progress Notes (Addendum)
INFECTIOUS DISEASE PROGRESS NOTE  ID: Derrick Fry is a 83 y.o. male with  Principal Problem:   Acute metabolic encephalopathy Active Problems:   DM II (diabetes mellitus, type II), controlled (HCC)   OSA (obstructive sleep apnea)   CAD (coronary artery disease)   CKD (chronic kidney disease), stage III   AAA (abdominal aortic aneurysm) (HCC)   Normocytic anemia   Pulmonary fibrosis (HCC)   Chronic systolic CHF (congestive heart failure) (HCC)  Subjective: More awake and alert, interacting (simple sentences with RN and family)  Abtx:  Anti-infectives    Start     Dose/Rate Route Frequency Ordered Stop   09/20/16 1000  anidulafungin (ERAXIS) 100 mg in sodium chloride 0.9 % 100 mL IVPB  Status:  Discontinued     100 mg 78 mL/hr over 100 Minutes Intravenous Every 24 hours 09/19/16 0842 09/19/16 1745   09/20/16 0800  vancomycin (VANCOCIN) IVPB 1000 mg/200 mL premix     1,000 mg 200 mL/hr over 60 Minutes Intravenous Every 24 hours 09/19/16 0842     09/19/16 2000  amphotericin B liposome (AMBISOME) 350 mg in dextrose 5 % 500 mL IVPB     5 mg/kg  70.4 kg 250 mL/hr over 120 Minutes Intravenous Every 24 hours 09/19/16 1745     09/19/16 1800  metroNIDAZOLE (FLAGYL) IVPB 500 mg     500 mg 100 mL/hr over 60 Minutes Intravenous Every 8 hours 09/19/16 0902     09/19/16 1000  anidulafungin (ERAXIS) 100 mg in sodium chloride 0.9 % 100 mL IVPB  Status:  Discontinued     100 mg 78 mL/hr over 100 Minutes Intravenous Every 24 hours 09/19/16 0811 09/19/16 0842   09/19/16 1000  metroNIDAZOLE (FLAGYL) IVPB 1 g     1 g 200 mL/hr over 60 Minutes Intravenous  Once 09/19/16 0902 09/19/16 1401   09/19/16 1000  cefTRIAXone (ROCEPHIN) 2 g in dextrose 5 % 50 mL IVPB     2 g 100 mL/hr over 30 Minutes Intravenous Every 12 hours 09/19/16 0902     09/19/16 0900  vancomycin (VANCOCIN) 1,250 mg in sodium chloride 0.9 % 250 mL IVPB     1,250 mg 166.7 mL/hr over 90 Minutes Intravenous  Once 09/19/16 0842  09/19/16 1035   09/19/16 0900  anidulafungin (ERAXIS) 200 mg in sodium chloride 0.9 % 200 mL IVPB     200 mg 78 mL/hr over 200 Minutes Intravenous  Once 09/19/16 0842 09/19/16 1442   09/18/16 0800  ceFEPIme (MAXIPIME) 1 g in dextrose 5 % 50 mL IVPB  Status:  Discontinued     1 g 100 mL/hr over 30 Minutes Intravenous Every 12 hours 09/17/16 1949 09/17/16 2019   09/18/16 0800  ceFEPIme (MAXIPIME) 1 g in dextrose 5 % 50 mL IVPB  Status:  Discontinued     1 g 100 mL/hr over 30 Minutes Intravenous Every 12 hours 09/17/16 2035 09/19/16 0902   09/18/16 0600  vancomycin (VANCOCIN) IVPB 750 mg/150 ml premix  Status:  Discontinued     750 mg 150 mL/hr over 60 Minutes Intravenous Every 12 hours 09/17/16 1949 09/17/16 2019   09/17/16 1800  ceFEPIme (MAXIPIME) 2 g in dextrose 5 % 50 mL IVPB     2 g 100 mL/hr over 30 Minutes Intravenous  Once 09/17/16 1748 09/17/16 1916   09/17/16 1800  vancomycin (VANCOCIN) IVPB 1000 mg/200 mL premix     1,000 mg 200 mL/hr over 60 Minutes Intravenous  Once 09/17/16 1748  09/17/16 1925      Medications:  Scheduled: . dexamethasone  4 mg Intravenous Q6H  . metoprolol tartrate  2.5 mg Intravenous Q6H  . nystatin  5 mL Oral QID  . sodium chloride flush  3 mL Intravenous Q12H  . cyanocobalamin  100 mcg Oral Daily    Objective: Vital signs in last 24 hours: Temp:  [97.9 F (36.6 C)-98.9 F (37.2 C)] 98.9 F (37.2 C) (07/18 1459) Pulse Rate:  [72-91] 79 (07/18 1459) Resp:  [16-20] 18 (07/18 1459) BP: (129-145)/(69-93) 145/85 (07/18 1459) SpO2:  [99 %-100 %] 100 % (07/18 1459) Weight:  [65.7 kg (144 lb 12.8 oz)] 65.7 kg (144 lb 12.8 oz) (07/18 0522)   General appearance: alert, cooperative, no distress and pale Eyes: negative findings: pupils = Throat: normal findings: oropharynx pink & moist without lesions or evidence of thrush Neck: no adenopathy, supple, symmetrical, trachea midline and FROM, no meningismus Resp: clear to auscultation  bilaterally Cardio: regular rate and rhythm GI: normal findings: bowel sounds normal and soft, non-tender  Lab Results  Recent Labs  09/19/16 0358 09/20/16 0331  WBC 13.3* 14.4*  HGB 10.0* 10.5*  HCT 29.2* 30.5*  NA 132* 135  K 3.5 3.3*  CL 101 100*  CO2 20* 25  BUN 21* 27*  CREATININE 1.23 1.11   Liver Panel  Recent Labs  09/17/16 1805  PROT 5.6*  ALBUMIN 2.1*  AST 38  ALT 34  ALKPHOS 67  BILITOT 0.8   Sedimentation Rate No results for input(s): ESRSEDRATE in the last 72 hours. C-Reactive Protein No results for input(s): CRP in the last 72 hours.  Microbiology: Recent Results (from the past 240 hour(s))  Urine culture     Status: None   Collection Time: 09/17/16  5:34 PM  Result Value Ref Range Status   Specimen Description URINE, RANDOM  Final   Special Requests Immunocompromised  Final   Culture   Final    NO GROWTH Performed at [facility], [address] [place], [place] [address]    Report Status 09/20/2016 FINAL  Final  Blood Culture (routine x 2)     Status: None (Preliminary result)   Collection Time: 09/17/16  6:04 PM  Result Value Ref Range Status   Specimen Description BLOOD LEFT WRIST  Final   Special Requests IN PEDIATRIC BOTTLE Blood Culture adequate volume  Final   Culture   Final    NO GROWTH 2 DAYS Performed at [facility], [place] [address] [place], [place] [address]    Report Status PENDING  Incomplete  Blood Culture (routine x 2)     Status: None (Preliminary result)   Collection Time: 09/17/16  6:05 PM  Result Value Ref Range Status   Specimen Description BLOOD RIGHT ARM  Final   Special Requests   Final    BOTTLES DRAWN AEROBIC AND ANAEROBIC Blood Culture adequate volume   Culture   Final    NO GROWTH 2 DAYS Performed at [facility], [place] [address], [place], [place] [address]    Report Status PENDING  Incomplete  Culture, Urine     Status: None   Collection Time: 09/18/16 12:00 PM  Result Value Ref  Range Status   Specimen Description URINE, RANDOM  Final   Special Requests   Final    can use urine sample in lab if still suitable for testing, otherwise collect fresh urine sample.   Culture   Final  NO GROWTH Performed at [facility], [place] [address], [place], [place] [address]    Report Status 09/19/2016 FINAL  Final  MRSA PCR Screening     Status: None   Collection Time: 09/18/16  1:30 PM  Result Value Ref Range Status   MRSA by PCR NEGATIVE NEGATIVE Final    Comment:        The GeneXpert MRSA Assay (FDA approved for NASAL specimens only), is one component of a comprehensive MRSA colonization surveillance program. It is not intended to diagnose MRSA infection nor to guide or monitor treatment for MRSA infections.     Studies/Results: Mr Jeri Cos VO Contrast  Result Date: 09/18/2016 CLINICAL DATA:  83 y/o M; fever and altered mental status. Concern for brain abscess. EXAM: MRI HEAD WITHOUT AND WITH CONTRAST TECHNIQUE: Multiplanar, multiecho pulse sequences of the brain and surrounding structures were obtained without and with intravenous contrast. CONTRAST:  63mL MULTIHANCE GADOBENATE DIMEGLUMINE 529 MG/ML IV SOLN COMPARISON:  09/18/2016 CT head FINDINGS: Brain: Rim enhancing lesions with central reduced effusion within the left lentiform nucleus, caudate head, anterior corona radiata, extending into the left frontal lobe. The largest measures 26 x 18 x 14 mm (AP x ML x CC series 11, image 36 and series 12, image 17). There is a single additional subcentimeter focus within the left lateral temporal lobe (series 11, image 20). There is associated surrounding T2 hyperintense signal abnormality in white matter and local mass effect with partial effacement of frontal horn of left lateral ventricle and 5 mm left-to-right midline shift. Background of mild chronic microvascular ischemic changes and moderate parenchymal volume loss of the brain. No hydrocephalus. No herniation. 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 Skull and upper cervical spine: Normal marrow signal. Sinuses/Orbits: Left maxillary and anterior ethmoid sinus opacification with a low signal nonenhancing lesion centered in the left ostiomeatal unit which may represent a inspissated mucocele or fungal ball. Bilateral intra-ocular lens replacement. Other: None. IMPRESSION: 1. Rim enhancing lesion with central reduced diffusion in the left frontal lobe consistent with abscess measuring up to 26 mm. Multiple additional subcentimeter abscesses throughout the left frontal lobe and left basal ganglia. Single subcentimeter abscess and left lateral temporal lobe. 2. Edema and associated mass effect results in partial effacement of frontal horn of left lateral ventricle and 5 mm of left-to-right midline shift. 3. 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 4. Left ostiomeatal unit nonenhancing low signal lesion may represent mucocele or possibly a fungal ball. Direct visualization recommended. These results will be called to the ordering clinician or representative by the Radiologist Assistant, and communication documented in the PACS or zVision Dashboard. Electronically Signed   By: Kristine Garbe M.D.   On: 09/18/2016 18:36     Assessment/Plan: Brain Abscess Fungal ball, L sinus  Dementia  Protein Calorie Malnutrition  Single kidney  Total days of antibiotics: 4 (cefepime/vanco/falgyl), ABLC day 1 decadron  Cr has improved since starting ABLC.  Will continue to watch.  Need tissue for dx (from abscesses) and Cx Not a neurosurgical candidate Possible ENT procedure, transfer to [facility] My appreciation to pharmacy for assistance with dosing, monitoring.  Discussed with pt's wife, family (incl risk of nephrotoxicity)          Bobby Rumpf Infectious Diseases (pager) [phone] [email or web address] 09/20/2016, 3:19 PM  LOS: 3 days

## 2016-09-20 NOTE — Progress Notes (Addendum)
PROGRESS NOTE    Derrick Fry  WJX:[id number] DOB: [birth year] DOA: 09/17/2016 PCP: Colon Branch, MD     Brief Narrative:  Derrick Fry is a 83 y.o. male with medical history significant for hypertension, coronary artery disease, AAA status post repair, chronic diastolic CHF, diabetes now diet controlled, congenital solitary kidney with chronic kidney disease stage III, and recent concern for possible descending thoracic aortic ulcer, now presenting from his nursing home for evaluation of fevers and increased confusion. Patient was recently admitted to the hospital with back pain and CTA chest concerning for a possible dissection in the thoracic aorta. He was evaluated by cardiology and CTS, felt not to be a surgical candidate, and ultimately suspected of having an ulcer in his thick aortic plaque, but no dissection, and was discharged in much improved and stable condition to SNF on 09/11/2016. He was doing well back at the nursing home until 7/14 when he began to develop fevers and increased confusion, and became nonverbal. Work up with CT head and MRI brain has revealed brain abscess as well as sinus fungal ball.   Assessment & Plan:   Principal Problem:   Acute lower UTI Active Problems:   DM II (diabetes mellitus, type II), controlled (HCC)   OSA (obstructive sleep apnea)   CAD (coronary artery disease)   CKD (chronic kidney disease), stage III   AAA (abdominal aortic aneurysm) (HCC)   Normocytic anemia   Pulmonary fibrosis (HCC)   Chronic systolic CHF (congestive heart failure) (HCC)   UTI (urinary tract infection)   Acute encephalopathy with aphagia  -Improving. 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 Erlinda Hong discussed with Dr. 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 -ENT consulted, Spoke with Dr. Janace Hoard, he recommends transfer to [facility] for possible procedure/intervention  -Continue amphotericin, ID following   Pyuria -Initial concern for UTI, urine culture negative   Acute on chronic systolic CHF -Echo EF 21-30%, given lasix, improved  -Stable today   Essential hypertension -Lopressor   History of AAA, status post repair, descending thoracic aortic ulcer -He was evaluated by thoracic surgery during last hospitalization from 7/1-7/9, he is deemed not a surgical candidate, he was taken off xarelto.   Hx DVT/PE -Taken off xarelto due to concern for descending thoracic aortic ulcer   CKD stage 3 -Baseline Cr 1.1 -Stable  Hypokalemia -Replace    DVT prophylaxis: SCD Code Status: DNR Family Communication: wife at bedside Disposition Plan: pending improvement, SNF when stable. Transfer to [facility] today for E[facility] evaluation    Consultants:   Neurosurgery, Dr. Erlinda Hong discussed over the phone with Dr. Annette Stable  Infectious disease  ENT   Procedures:   None  Antimicrobials:  Anti-infectives    Start     Dose/Rate Route Frequency Ordered Stop   09/20/16 1000  anidulafungin (ERAXIS) 100 mg in sodium chloride 0.9 % 100 mL IVPB  Status:  Discontinued     100 mg 78 mL/hr over 100 Minutes Intravenous Every 24 hours 09/19/16 0842 09/19/16 1745   09/20/16 0800  vancomycin (VANCOCIN) IVPB 1000 mg/200 mL premix     1,000 mg 200 mL/hr over 60 Minutes Intravenous Every 24 hours 09/19/16 0842     09/19/16 2000  amphotericin B liposome (AMBISOME) 350 mg in  dextrose 5 % 500 mL IVPB     5 mg/kg  70.4 kg 250 mL/hr over 120 Minutes Intravenous Every 24 hours 09/19/16 1745     09/19/16 1800  metroNIDAZOLE (FLAGYL) IVPB 500 mg     500 mg 100 mL/hr over 60 Minutes Intravenous Every 8 hours 09/19/16 0902     09/19/16 1000  anidulafungin (ERAXIS) 100 mg in sodium chloride 0.9 % 100 mL IVPB  Status:  Discontinued     100 mg 78 mL/hr over 100 Minutes Intravenous Every 24 hours 09/19/16 0811 09/19/16 0842   09/19/16 1000  metroNIDAZOLE (FLAGYL) IVPB 1 g     1 g 200 mL/hr over 60 Minutes Intravenous  Once 09/19/16 0902 09/19/16 1401   09/19/16 1000  cefTRIAXone (ROCEPHIN) 2 g in dextrose 5 % 50 mL IVPB     2 g 100 mL/hr over 30 Minutes Intravenous Every 12 hours 09/19/16 0902     09/19/16 0900  vancomycin (VANCOCIN) 1,250 mg in sodium chloride 0.9 % 250 mL IVPB     1,250 mg 166.7 mL/hr over 90 Minutes Intravenous  Once 09/19/16 0842 09/19/16 1035   09/19/16 0900  anidulafungin (ERAXIS) 200 mg in sodium chloride 0.9 % 200 mL IVPB     200 mg 78 mL/hr over 200 Minutes Intravenous  Once 09/19/16 0842 09/19/16 1442   09/18/16 0800  ceFEPIme (MAXIPIME) 1 g in dextrose 5 % 50 mL IVPB  Status:  Discontinued     1 g 100 mL/hr over 30 Minutes Intravenous Every 12 hours 09/17/16 1949 09/17/16 2019   09/18/16 0800  ceFEPIme (MAXIPIME) 1 g in dextrose 5 % 50 mL IVPB  Status:  Discontinued     1 g 100 mL/hr over 30 Minutes Intravenous Every 12 hours 09/17/16 2035 09/19/16 0902   09/18/16 0600  vancomycin (VANCOCIN) IVPB 750 mg/150 ml premix  Status:  Discontinued     750 mg 150 mL/hr over 60 Minutes Intravenous Every 12 hours 09/17/16 1949 09/17/16 2019   09/17/16 1800  ceFEPIme (MAXIPIME) 2 g in dextrose 5 % 50 mL IVPB     2 g 100 mL/hr over 30 Minutes Intravenous  Once 09/17/16 1748 09/17/16 1916   09/17/16 1800  vancomycin (VANCOCIN) IVPB 1000 mg/200 mL premix     1,000 mg 200 mL/hr over 60 Minutes Intravenous  Once 09/17/16 1748 09/17/16 1925        Subjective: Patient without any complaints this morning. He has been afebrile, denies headache, chest pain, or abdominal pain.   Objective: Vitals:   09/19/16 1345 09/19/16 1739 09/19/16 2020 09/20/16 0522  BP: 140/88 133/69 (!) 129/93 (!) 141/87  Pulse: (!) 109 77 91 80  Resp: 16  20 16   Temp: 98 F (36.7 C)  97.9 F (36.6 C) 98.4 F (36.9 C)  TempSrc: Oral  Oral Oral  SpO2: 98%  99% 99%  Weight:    65.7 kg (144 lb 12.8 oz)  Height:        Intake/Output Summary (Last 24 hours) at 09/20/16 1050 Last data filed at 09/20/16 0609  Gross per 24 hour  Intake  1060 ml  Output             2301 ml  Net            -1241 ml   Filed Weights   09/17/16 2333 09/19/16 0408 09/20/16 0522  Weight: 71.2 kg (157 lb) 70.4 kg (155 lb 3.2 oz) 65.7 kg (144 lb 12.8 oz)    Examination:  General exam: Appears calm and comfortable, alert to voice  Respiratory system: Clear to auscultation. Respiratory effort normal. Cardiovascular system: S1 & S2 heard, RRR. No JVD, murmurs, rubs, gallops or clicks. No pedal edema. Gastrointestinal system: Abdomen is nondistended, soft and nontender. No organomegaly or masses felt. Normal bowel sounds heard. Central nervous system: Alert. Follows commands, able to tell me his wife's name, answers simple yes/no questions, states he is "fine", pupils are unequal in size R>L, but react to light, other CN exam grossly unremarkable, moves extremities, hand grip strong equally  Extremities: Symmetric Skin: No rashes, lesions or ulcers on exposed skin   Data Reviewed: I have personally reviewed following labs and imaging studies  CBC:  Recent Labs Lab 09/17/16 1805 09/18/16 0358 09/19/16 0358 09/20/16 0331  WBC 15.9* 18.3* 13.3* 14.4*  NEUTROABS 13.8* 15.9*  --   --   HGB 9.8* 10.2* 10.0* 10.5*  HCT 29.2* 30.5* 29.2* 30.5*  MCV 97.7 99.0 98.0 95.0  PLT 176 149* 147* 831   Basic Metabolic Panel:  Recent Labs Lab 09/17/16 1805  09/18/16 0358 09/19/16 0358 09/20/16 0331  NA 133* 132* 132* 135  K 4.6 4.4 3.5 3.3*  CL 103 104 101 100*  CO2 21* 19* 20* 25  GLUCOSE 135* 119* 136* 248*  BUN 24* 20 21* 27*  CREATININE 1.18 1.01 1.23 1.11  CALCIUM 9.1 8.4* 8.6* 8.4*  MG  --   --  1.7 1.8   GFR: Estimated Creatinine Clearance: 43.6 mL/min (by C-G formula based on SCr of 1.11 mg/dL). Liver Function Tests:  Recent Labs Lab 09/17/16 1805  AST 38  ALT 34  ALKPHOS 67  BILITOT 0.8  PROT 5.6*  ALBUMIN 2.1*   No results for input(s): LIPASE, AMYLASE in the last 168 hours. No results for input(s): AMMONIA in the last 168 hours. Coagulation Profile: No results for input(s): INR, PROTIME in the last 168 hours. Cardiac Enzymes: No results for input(s): CKTOTAL, CKMB, CKMBINDEX, TROPONINI in the last 168 hours. BNP (last 3 results) No results for input(s): PROBNP in the last 8760 hours. HbA1C: No results for input(s): HGBA1C in the last 72 hours. CBG: No results for input(s): GLUCAP in the last 168 hours. Lipid Profile: No results for input(s): CHOL, HDL, LDLCALC, TRIG, CHOLHDL, LDLDIRECT in the last 72 hours. Thyroid Function Tests: No results for input(s): TSH, T4TOTAL, FREET4, T3FREE, THYROIDAB in the last 72 hours. Anemia Panel: No results for input(s): VITAMINB12, FOLATE, FERRITIN, TIBC, IRON, RETICCTPCT in the last 72 hours. Sepsis Labs:  Recent Labs Lab 09/17/16 1807  LATICACIDVEN 0.82    Recent Results (from the past 240 hour(s))  Urine culture     Status: None   Collection Time: 09/17/16  5:34 PM  Result Value Ref Range Status   Specimen Description URINE, RANDOM  Final   Special Requests Immunocompromised  Final   Culture   Final    NO GROWTH Performed at [facility], [place] [address], [place], [place] [address]    Report Status 09/20/2016 FINAL  Final  Blood Culture (routine x 2)  Status: None (Preliminary result)   Collection Time: 09/17/16  6:04 PM  Result Value Ref Range  Status   Specimen Description BLOOD LEFT WRIST  Final   Special Requests IN PEDIATRIC BOTTLE Blood Culture adequate volume  Final   Culture   Final    NO GROWTH 1 DAY Performed at [facility], [place] [address], [place], [place] [address]    Report Status PENDING  Incomplete  Blood Culture (routine x 2)     Status: None (Preliminary result)   Collection Time: 09/17/16  6:05 PM  Result Value Ref Range Status   Specimen Description BLOOD RIGHT ARM  Final   Special Requests   Final    BOTTLES DRAWN AEROBIC AND ANAEROBIC Blood Culture adequate volume   Culture   Final    NO GROWTH 1 DAY Performed at [facility], [place] [address], [place], [place] [address]    Report Status PENDING  Incomplete  Culture, Urine     Status: None   Collection Time: 09/18/16 12:00 PM  Result Value Ref Range Status   Specimen Description URINE, RANDOM  Final   Special Requests   Final    can use urine sample in lab if still suitable for testing, otherwise collect fresh urine sample.   Culture   Final    NO GROWTH Performed at [facility], [place] [address], [place], [place] [address]    Report Status 09/19/2016 FINAL  Final  MRSA PCR Screening     Status: None   Collection Time: 09/18/16  1:30 PM  Result Value Ref Range Status   MRSA by PCR NEGATIVE NEGATIVE Final    Comment:        The GeneXpert MRSA Assay (FDA approved for NASAL specimens only), is one component of a comprehensive MRSA colonization surveillance program. It is not intended to diagnose MRSA infection nor to guide or monitor treatment for MRSA infections.        Radiology Studies: Ct Head Wo Contrast  Result Date: 09/18/2016 CLINICAL DATA:  Aphasia EXAM: CT HEAD WITHOUT CONTRAST TECHNIQUE: Contiguous axial images were obtained from the base of the skull through the vertex without intravenous contrast. COMPARISON:  August 08, 2005 FINDINGS: Brain: There is mild diffuse atrophy. There is no intracranial mass,  hemorrhage, extra-axial fluid collection, or midline shift. There is decreased attenuation in the mid left frontal lobe. There is a suggestion within this area of decreased attenuation of a focal area of decreased attenuation with surrounding rim of increased attenuation measuring 7 x 7 mm, best seen on axial slice 21 series 2. There is localized sulcal effacement in the superior left frontal lobe in the area of this lesion. Elsewhere, there is small vessel disease in the centra semiovale bilaterally as well as in the left external capsule. Vascular: There is no hyperdense vessel. There is calcification in each carotid siphon region. Skull: The bony calvarium appears intact. Sinuses/Orbits: There is opacification of the left maxillary antrum with extension of soft tissue material medial to the left maxillary antrum into the left nasal cavity with localized nares obstruction on the left. There is medial bowing of the medial wall of the left maxillary antrum. Elsewhere, there is opacification in superior left ethmoid air cells. There is mild mucosal thickening in the inferior left frontal sinus region. Orbits appear symmetric bilaterally. Other: Mastoid air cells are clear. IMPRESSION: 1. Decreased attenuation in the mid superior left frontal lobe. Within this area, there is a  questionable 7 x 7 mm focus of decreased attenuation with increased attenuation periphery. Question small mass or abscess with edema in this area. An acute infarct in this area is an alternative differential consideration cannot be excluded on this noncontrast enhanced study. Brain MRI pre and post-contrast administration could be most helpful for further delineation in this regard. 2. Elsewhere, there is atrophy with patchy periventricular small vessel disease and small vessel disease throughout the left external capsule. No hemorrhage evident. 3. Areas of paranasal sinus disease. Extensive opacification in the left maxillary antrum with  extension medially into the left nasal cavity with partial obstruction of the left nasal cavity. Suspect antrochoanal polyp with mucocele in this area. Areas of paranasal sinus disease also noted in the left anterior ethmoid and inferior left frontal regions. ENT evaluation may well be warranted given the changes arising from the left maxillary antrum. 4.  Areas of arterial vascular calcification noted. These results will be called to the ordering clinician or representative by the Radiologist Assistant, and communication documented in the PACS or zVision Dashboard. Electronically Signed   By: Lowella Grip III M.D.   On: 09/18/2016 15:25   Mr Jeri Cos MW Contrast  Result Date: 09/18/2016 CLINICAL DATA:  83 y/o M; fever and altered mental status. Concern for brain abscess. EXAM: MRI HEAD WITHOUT AND WITH CONTRAST TECHNIQUE: Multiplanar, multiecho pulse sequences of the brain and surrounding structures were obtained without and with intravenous contrast. CONTRAST:  49mL MULTIHANCE GADOBENATE DIMEGLUMINE 529 MG/ML IV SOLN COMPARISON:  09/18/2016 CT head FINDINGS: Brain: Rim enhancing lesions with central reduced effusion within the left lentiform nucleus, caudate head, anterior corona radiata, extending into the left frontal lobe. The largest measures 26 x 18 x 14 mm (AP x ML x CC series 11, image 36 and series 12, image 17). There is a single additional subcentimeter focus within the left lateral temporal lobe (series 11, image 20). There is associated surrounding T2 hyperintense signal abnormality in white matter and local mass effect with partial effacement of frontal horn of left lateral ventricle and 5 mm left-to-right midline shift. Background of mild chronic microvascular ischemic changes and moderate parenchymal volume loss of the brain. No hydrocephalus. No herniation. Small chronic infarction in the right cerebellar hemisphere. Vascular: Suspected 5 mm aneurysm of the right MCA bifurcation in the MCA  cistern (series 6, image 10). Skull and upper cervical spine: Normal marrow signal. Sinuses/Orbits: Left maxillary and anterior ethmoid sinus opacification with a low signal nonenhancing lesion centered in the left ostiomeatal unit which may represent a inspissated mucocele or fungal ball. Bilateral intra-ocular lens replacement. Other: None. IMPRESSION: 1. Rim enhancing lesion with central reduced diffusion in the left frontal lobe consistent with abscess measuring up to 26 mm. Multiple additional subcentimeter abscesses throughout the left frontal lobe and left basal ganglia. Single subcentimeter abscess and left lateral temporal lobe. 2. Edema and associated mass effect results in partial effacement of frontal horn of left lateral ventricle and 5 mm of left-to-right midline shift. 3. 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 4. Left ostiomeatal unit nonenhancing low signal lesion may represent mucocele or possibly a fungal ball. Direct visualization recommended. These results will be called to the ordering clinician or representative by the Radiologist Assistant, and communication documented in the PACS or zVision Dashboard. Electronically Signed   By: Kristine Garbe M.D.   On: 09/18/2016 18:36      Scheduled Meds: . dexamethasone  4 mg Intravenous Q6H  . metoprolol tartrate  2.5 mg Intravenous Q6H  . nystatin  5 mL Oral QID  . sodium chloride flush  3 mL Intravenous Q12H  . cyanocobalamin  100 mcg Oral Daily   Continuous Infusions: . sodium chloride    . sodium chloride Stopped (09/19/16 2108)  . sodium chloride    . amphotericin  B  Liposome (AMBISOME) ADULT IV Stopped (09/19/16 2309)  . cefTRIAXone (ROCEPHIN)  IV Stopped (09/19/16 2139)  . dextrose 0 mL (09/19/16 2000)  . dextrose    . metronidazole 500 mg (09/20/16 1048)  . potassium chloride 10 mEq (09/20/16 0901)  . vancomycin Stopped (09/20/16 1005)     LOS: 3 days    Time spent: 13 minutes   Dessa Phi,  DO [facility] [email or web address] Password [email or web address] 09/20/2016, 10:50 AM

## 2016-09-20 NOTE — Progress Notes (Signed)
Pt transferred to [facility] via [facility]. Family departed shortly before transport, they were notified of the new room and estimated arrival time. Hortencia Conradi RN

## 2016-09-21 ENCOUNTER — Encounter (HOSPITAL_COMMUNITY)
Admission: EM | Disposition: A | Discharge status: Skilled Nursing Facility | Payer: Self-pay | Source: Home / Self Care | Attending: Family Medicine

## 2016-09-21 ENCOUNTER — Inpatient Hospital Stay (HOSPITAL_COMMUNITY): Payer: PPO

## 2016-09-21 ENCOUNTER — Inpatient Hospital Stay (HOSPITAL_COMMUNITY): Payer: PPO | Admitting: Certified Registered"

## 2016-09-21 ENCOUNTER — Ambulatory Visit: Payer: PPO | Admitting: Internal Medicine

## 2016-09-21 DIAGNOSIS — G060 Intracranial abscess and granuloma: Secondary | ICD-10-CM

## 2016-09-21 DIAGNOSIS — J329 Chronic sinusitis, unspecified: Secondary | ICD-10-CM

## 2016-09-21 HISTORY — PX: SINUS ENDO W/FUSION: SHX777

## 2016-09-21 LAB — COMPREHENSIVE METABOLIC PANEL
ALBUMIN: 2 g/dL — AB (ref 3.5–5.0)
ALT: 26 U/L (ref 17–63)
ANION GAP: 8 (ref 5–15)
AST: 33 U/L (ref 15–41)
Alkaline Phosphatase: 62 U/L (ref 38–126)
BUN: 28 mg/dL — AB (ref 6–20)
CO2: 22 mmol/L (ref 22–32)
Calcium: 8.6 mg/dL — ABNORMAL LOW (ref 8.9–10.3)
Chloride: 104 mmol/L (ref 101–111)
Creatinine, Ser: 1.07 mg/dL (ref 0.61–1.24)
GFR calc Af Amer: >60 mL/min (ref >60)
GFR calc non Af Amer: >60 mL/min (ref >60)
GLUCOSE: 247 mg/dL — AB (ref 65–99)
POTASSIUM: 3.5 mmol/L (ref 3.5–5.1)
SODIUM: 134 mmol/L — AB (ref 135–145)
Total Bilirubin: 0.8 mg/dL (ref 0.3–1.2)
Total Protein: 5.1 g/dL — ABNORMAL LOW (ref 6.5–8.1)

## 2016-09-21 LAB — GLUCOSE, CAPILLARY
GLUCOSE-CAPILLARY: 211 mg/dL — AB (ref 65–99)
GLUCOSE-CAPILLARY: 221 mg/dL — AB (ref 65–99)
GLUCOSE-CAPILLARY: 209 mg/dL — AB (ref 65–99)
Glucose-Capillary: 237 mg/dL — ABNORMAL HIGH (ref 65–99)

## 2016-09-21 LAB — HIV ANTIBODY (ROUTINE TESTING W REFLEX): HIV Screen 4th Generation wRfx: NONREACTIVE

## 2016-09-21 LAB — MAGNESIUM: Magnesium: 2 mg/dL (ref 1.7–2.4)

## 2016-09-21 SURGERY — SINUS SURGERY, ENDOSCOPIC, USING COMPUTER-ASSISTED NAVIGATION
Anesthesia: General | Site: Nose | Laterality: Left

## 2016-09-21 SURGERY — SINUS SURGERY, ENDOSCOPIC, USING COMPUTER-ASSISTED NAVIGATION
Anesthesia: General | Laterality: Left

## 2016-09-21 MED ORDER — ARTIFICIAL TEARS OPHTHALMIC OINT
TOPICAL_OINTMENT | OPHTHALMIC | Status: AC
  Filled 2016-09-21: qty 3.5

## 2016-09-21 MED ORDER — MUPIROCIN 2 % EX OINT
TOPICAL_OINTMENT | CUTANEOUS | Status: AC
  Filled 2016-09-21: qty 22

## 2016-09-21 MED ORDER — ARTIFICIAL TEARS OPHTHALMIC OINT
TOPICAL_OINTMENT | OPHTHALMIC | Status: DC | PRN
  Administered 2016-09-21: 1 via OPHTHALMIC

## 2016-09-21 MED ORDER — LIDOCAINE HCL 4 % EX SOLN
0.0000 mL | Freq: Once | CUTANEOUS | Status: DC | PRN
  Filled 2016-09-21: qty 50

## 2016-09-21 MED ORDER — LIDOCAINE-EPINEPHRINE 1 %-1:100000 IJ SOLN
INTRAMUSCULAR | Status: AC
  Filled 2016-09-21: qty 1

## 2016-09-21 MED ORDER — EPHEDRINE SULFATE-NACL 50-0.9 MG/10ML-% IV SOSY
PREFILLED_SYRINGE | INTRAVENOUS | Status: DC | PRN
  Administered 2016-09-21 (×2): 10 mg via INTRAVENOUS

## 2016-09-21 MED ORDER — LIDOCAINE-EPINEPHRINE (PF) 1 %-1:200000 IJ SOLN
0.0000 mL | Freq: Once | INTRAMUSCULAR | Status: DC | PRN
  Filled 2016-09-21: qty 30

## 2016-09-21 MED ORDER — ONDANSETRON HCL 4 MG/2ML IJ SOLN
INTRAMUSCULAR | Status: DC | PRN
  Administered 2016-09-21: 4 mg via INTRAVENOUS

## 2016-09-21 MED ORDER — FENTANYL CITRATE (PF) 100 MCG/2ML IJ SOLN
INTRAMUSCULAR | Status: DC | PRN
  Administered 2016-09-21: 50 ug via INTRAVENOUS
  Administered 2016-09-21: 125 ug via INTRAVENOUS

## 2016-09-21 MED ORDER — PHENYLEPHRINE HCL 10 MG/ML IJ SOLN
INTRAVENOUS | Status: DC | PRN
  Administered 2016-09-21: 50 ug/min via INTRAVENOUS

## 2016-09-21 MED ORDER — FENTANYL CITRATE (PF) 100 MCG/2ML IJ SOLN: 25.0000 ug | INTRAMUSCULAR | Status: DC | PRN

## 2016-09-21 MED ORDER — FENTANYL CITRATE (PF) 250 MCG/5ML IJ SOLN
INTRAMUSCULAR | Status: AC
  Filled 2016-09-21: qty 5

## 2016-09-21 MED ORDER — SUGAMMADEX SODIUM 200 MG/2ML IV SOLN
INTRAVENOUS | Status: DC | PRN
  Administered 2016-09-21: 200 mg via INTRAVENOUS

## 2016-09-21 MED ORDER — OXYMETAZOLINE HCL 0.05 % NA SOLN
NASAL | Status: DC | PRN
  Administered 2016-09-21: 1 via TOPICAL

## 2016-09-21 MED ORDER — INSULIN ASPART 100 UNIT/ML ~~LOC~~ SOLN
5.0000 [IU] | Freq: Once | SUBCUTANEOUS | Status: AC
  Administered 2016-09-21: 5 [IU] via SUBCUTANEOUS

## 2016-09-21 MED ORDER — PROPOFOL 10 MG/ML IV BOLUS
INTRAVENOUS | Status: AC
  Filled 2016-09-21: qty 20

## 2016-09-21 MED ORDER — INSULIN GLARGINE 100 UNIT/ML ~~LOC~~ SOLN
25.0000 [IU] | Freq: Every day | SUBCUTANEOUS | Status: DC
  Administered 2016-09-21 – 2016-09-23 (×3): 25 [IU] via SUBCUTANEOUS
  Filled 2016-09-21 (×4): qty 0.25

## 2016-09-21 MED ORDER — ONDANSETRON HCL 4 MG/2ML IJ SOLN: 4.0000 mg | Freq: Once | INTRAMUSCULAR | Status: DC | PRN

## 2016-09-21 MED ORDER — LIDOCAINE-EPINEPHRINE 1 %-1:100000 IJ SOLN
INTRAMUSCULAR | Status: DC | PRN
  Administered 2016-09-21: 4 mL

## 2016-09-21 MED ORDER — SODIUM CHLORIDE 0.9 % IR SOLN
Status: DC | PRN
  Administered 2016-09-21: 1000 mL

## 2016-09-21 MED ORDER — TRIPLE ANTIBIOTIC 3.5-400-5000 EX OINT
1.0000 "application " | TOPICAL_OINTMENT | Freq: Once | CUTANEOUS | Status: DC | PRN
  Filled 2016-09-21: qty 1

## 2016-09-21 MED ORDER — OXYCODONE HCL 5 MG PO TABS: 5.0000 mg | ORAL_TABLET | Freq: Once | ORAL | Status: DC | PRN

## 2016-09-21 MED ORDER — MUPIROCIN CALCIUM 2 % NA OINT
TOPICAL_OINTMENT | NASAL | Status: DC | PRN
  Administered 2016-09-21: 1 via NASAL

## 2016-09-21 MED ORDER — OXYCODONE HCL 5 MG/5ML PO SOLN: 5.0000 mg | Freq: Once | ORAL | Status: DC | PRN

## 2016-09-21 MED ORDER — LIDOCAINE HCL 2 % EX GEL
1.0000 "application " | Freq: Once | CUTANEOUS | Status: DC | PRN
  Filled 2016-09-21: qty 5

## 2016-09-21 MED ORDER — LIDOCAINE HCL (CARDIAC) 20 MG/ML IV SOLN
INTRAVENOUS | Status: DC | PRN
  Administered 2016-09-21: 70 mg via INTRAVENOUS
  Administered 2016-09-21: 30 mg via INTRAVENOUS

## 2016-09-21 MED ORDER — LACTATED RINGERS IV SOLN
INTRAVENOUS | Status: DC | PRN
  Administered 2016-09-21: 13:00:00 via INTRAVENOUS

## 2016-09-21 MED ORDER — OXYMETAZOLINE HCL 0.05 % NA SOLN
NASAL | Status: AC
  Filled 2016-09-21: qty 15

## 2016-09-21 MED ORDER — INSULIN ASPART 100 UNIT/ML ~~LOC~~ SOLN
0.0000 [IU] | Freq: Three times a day (TID) | SUBCUTANEOUS | Status: DC
  Administered 2016-09-21 – 2016-09-22 (×2): 5 [IU] via SUBCUTANEOUS
  Administered 2016-09-22: 3 [IU] via SUBCUTANEOUS
  Administered 2016-09-22: 5 [IU] via SUBCUTANEOUS
  Administered 2016-09-23 – 2016-09-25 (×3): 2 [IU] via SUBCUTANEOUS
  Administered 2016-09-26: 5 [IU] via SUBCUTANEOUS
  Administered 2016-09-26: 2 [IU] via SUBCUTANEOUS
  Administered 2016-09-26 – 2016-09-27 (×2): 3 [IU] via SUBCUTANEOUS
  Administered 2016-09-27: 8 [IU] via SUBCUTANEOUS
  Administered 2016-09-27: 2 [IU] via SUBCUTANEOUS
  Administered 2016-09-28 (×2): 5 [IU] via SUBCUTANEOUS

## 2016-09-21 MED ORDER — SILVER NITRATE-POT NITRATE 75-25 % EX MISC
1.0000 | Freq: Once | CUTANEOUS | Status: DC | PRN
  Filled 2016-09-21: qty 1

## 2016-09-21 MED ORDER — ROCURONIUM BROMIDE 100 MG/10ML IV SOLN
INTRAVENOUS | Status: DC | PRN
  Administered 2016-09-21: 40 mg via INTRAVENOUS

## 2016-09-21 MED ORDER — PHENYLEPHRINE 40 MCG/ML (10ML) SYRINGE FOR IV PUSH (FOR BLOOD PRESSURE SUPPORT)
PREFILLED_SYRINGE | INTRAVENOUS | Status: DC | PRN
  Administered 2016-09-21: 40 ug via INTRAVENOUS

## 2016-09-21 MED ORDER — MAGIC MOUTHWASH: 1.0000 mL | Freq: Three times a day (TID) | ORAL | Status: DC

## 2016-09-21 MED ORDER — PROPOFOL 10 MG/ML IV BOLUS
INTRAVENOUS | Status: DC | PRN
  Administered 2016-09-21: 90 mg via INTRAVENOUS

## 2016-09-21 MED ORDER — INSULIN ASPART 100 UNIT/ML ~~LOC~~ SOLN
3.0000 [IU] | Freq: Three times a day (TID) | SUBCUTANEOUS | Status: DC
  Administered 2016-09-21 – 2016-09-28 (×11): 3 [IU] via SUBCUTANEOUS

## 2016-09-21 MED ORDER — OXYMETAZOLINE HCL 0.05 % NA SOLN
1.0000 | Freq: Once | NASAL | Status: DC | PRN
Start: 2016-09-21 — End: 2016-09-28
  Filled 2016-09-21: qty 15

## 2016-09-21 MED ORDER — ONDANSETRON HCL 4 MG/2ML IJ SOLN
INTRAMUSCULAR | Status: AC
  Filled 2016-09-21: qty 2

## 2016-09-21 SURGICAL SUPPLY — 48 items
BLADE ROTATE RAD 40 4 M4 (BLADE) IMPLANT
BLADE ROTATE RAD 40 4MM M4 (BLADE)
BLADE ROTATE TRICUT 4MX13CM M4 (BLADE) ×1
BLADE ROTATE TRICUT 4X13 M4 (BLADE) ×2 IMPLANT
CANISTER SUCT 3000ML PPV (MISCELLANEOUS) ×3 IMPLANT
CLOSURE WOUND 1/2 X4 (GAUZE/BANDAGES/DRESSINGS)
COAGULATOR SUCT 6 FR SWTCH (ELECTROSURGICAL) ×1
COAGULATOR SUCT SWTCH 10FR 6 (ELECTROSURGICAL) ×2 IMPLANT
CONT SPEC 4OZ CLIKSEAL STRL BL (MISCELLANEOUS) IMPLANT
CORDS BIPOLAR (ELECTRODE) IMPLANT
CRADLE DONUT ADULT HEAD (MISCELLANEOUS) IMPLANT
DECANTER SPIKE VIAL GLASS SM (MISCELLANEOUS) ×3 IMPLANT
DRAPE HALF SHEET 40X57 (DRAPES) IMPLANT
DRSG NASOPORE 8CM (GAUZE/BANDAGES/DRESSINGS) ×3 IMPLANT
DURASEAL SPINE SEALANT 3ML (MISCELLANEOUS) IMPLANT
ELECT COATED BLADE 2.86 ST (ELECTRODE) ×3 IMPLANT
ELECT REM PT RETURN 9FT ADLT (ELECTROSURGICAL)
ELECTRODE REM PT RTRN 9FT ADLT (ELECTROSURGICAL) IMPLANT
FILTER ARTHROSCOPY CONVERTOR (FILTER) ×3 IMPLANT
FLOSEAL 10ML (HEMOSTASIS) IMPLANT
GLOVE ECLIPSE 7.5 STRL STRAW (GLOVE) ×3 IMPLANT
GOWN BRE IMP SLV SIRUS LXLNG (GOWN DISPOSABLE) ×3 IMPLANT
GOWN STRL REUS W/ TWL LRG LVL3 (GOWN DISPOSABLE) ×1 IMPLANT
GOWN STRL REUS W/TWL LRG LVL3 (GOWN DISPOSABLE) ×2
KIT BASIN OR (CUSTOM PROCEDURE TRAY) ×3 IMPLANT
KIT ROOM TURNOVER OR (KITS) ×3 IMPLANT
NEEDLE HYPO 25GX1X1/2 BEV (NEEDLE) IMPLANT
NEEDLE SPNL 25GX3.5 QUINCKE BL (NEEDLE) IMPLANT
NS IRRIG 1000ML POUR BTL (IV SOLUTION) ×3 IMPLANT
PAD ARMBOARD 7.5X6 YLW CONV (MISCELLANEOUS) ×6 IMPLANT
PAD ENT ADHESIVE 25PK (MISCELLANEOUS) ×3 IMPLANT
PATTIES SURGICAL .5 X3 (DISPOSABLE) ×3 IMPLANT
PENCIL FOOT CONTROL (ELECTRODE) IMPLANT
SOLUTION ANTI FOG 6CC (MISCELLANEOUS) ×3 IMPLANT
SPECIMEN JAR SMALL (MISCELLANEOUS) ×6 IMPLANT
STRIP CLOSURE SKIN 1/2X4 (GAUZE/BANDAGES/DRESSINGS) IMPLANT
SUT ETHILON 3 0 PS 1 (SUTURE) IMPLANT
SWAB COLLECTION DEVICE MRSA (MISCELLANEOUS) IMPLANT
SYR 50ML SLIP (SYRINGE) IMPLANT
SYR CONTROL 10ML LL (SYRINGE) ×3 IMPLANT
TRACKER ENT INSTRUMENT (MISCELLANEOUS) ×3 IMPLANT
TRACKER ENT PATIENT (MISCELLANEOUS) ×3 IMPLANT
TRAY ENT MC OR (CUSTOM PROCEDURE TRAY) ×3 IMPLANT
TUBE CONNECTING 20'X1/4 (TUBING) ×1
TUBE CONNECTING 20X1/4 (TUBING) ×2 IMPLANT
TUBING STRAIGHTSHOT EPS 5PK (TUBING) ×3 IMPLANT
WATER STERILE IRR 1000ML POUR (IV SOLUTION) ×3 IMPLANT
WIPE INSTRUMENT VISIWIPE 73X73 (MISCELLANEOUS) ×3 IMPLANT

## 2016-09-21 NOTE — Consult Note (Signed)
Reason for Consult: Sinusitis in the face of brain abscess Referring Physician: Hospitalist  Derrick Fry is an 83 y.o. male.  HPI: History of aortic aneurysm and was admitted to the hospital on July 1. It was determined he was not a surgical candidate based on the wife's history. He developed on Friday some change in mental status. No longer was talking and seemed to have other metabolic and mental changes. He subsequently was determined to have brain abscesses. He is being treated with intravenous antibiotics including fungal treatment. He has been seen by infectious disease. He has no significant history of sinusitis based on the family's history. He has some nasal congestion that's periodic. Nothing that has worsened. No facial pressure or pain.  Past Medical History:  Diagnosis Date  .  peripheral neuropathy --UDS--pain mngmt  09/05/2006   Qualifier: Diagnosis of  By: Larose Kells MD, [place] AAA (abdominal aortic aneurysm) ([place])   . Anemia   . Aortic dissection ([place]) 09/03/2016  . Arrhythmia 02/09/2016  . Arthritis    "all over" (02/10/2016)  . Basal cell carcinoma of face    "burned off" (02/10/2016)  . CAD (coronary artery disease)    MI 08-04-85  . Chronic lower back pain   . Decreased cardiac ejection fraction 09/04/2016   EF 35%  . Diabetes mellitus with neuropathy ([place])    "borderline" (02/10/2016)  . DM II (diabetes mellitus, type II), controlled ([place]) 09/05/2006   Qualifier: Diagnosis of  By: Larose Kells MD, [place] DVT (deep venous thrombosis) ([place]) 02/2016   "left thigh"  . Essential hypertension 09/05/2006   Qualifier: Diagnosis of  By: Larose Kells MD, [place] Gait abnormality    chronic imbalance  . GAIT DISTURBANCE 08/22/2007   Qualifier: Diagnosis of  By: Larose Kells MD, [place] Gout    diskitis 10/2008, Dr Ouida Sills  . Gout 08/22/2007   Gout diagnosed 10-2008. he had a  L4 L5-S1 gout diskitis DEXA neg 3-09 and 08-2009   . Heart murmur dx'd 02/2016  . High cholesterol 03/23/2009   Qualifier:  Diagnosis of  By: Larose Kells MD, [place] History of DVT (deep vein thrombosis) 02/09/2016  . Hyperlipidemia   . Hypertension   . Myocardial infarction ([place]) 1987   "before OHS"  . OSA (obstructive sleep apnea)    Limited CPAP tolerance (02/10/2016)  . Osteoarthritis   . Pneumonia 1938   had right pneumonia pleurisy requiring resection of ribs and chest tube drainage at age 51  . Pulmonary embolism ([place]) 02/2016   "left"  . Pulmonary fibrosis ([place]) 01/29/2013  . Renal insufficiency    chronic w/ solitary kidney, congenital  . RENAL INSUFFICIENCY, CHRONIC 09/05/2006   Qualifier: Diagnosis of  By: Larose Kells MD, [place] SLEEP APNEA 09/05/2006   History of a sleep study in 2006 by Dr. Annamaria Boots    . Small bowel obstruction ([place]) 04/01/2012  . SOLITARY KIDNEY, CONGENITAL 09/05/2006   Qualifier: Diagnosis of  By: Larose Kells MD, [place]     Past Surgical History:  Procedure Laterality Date  . ABDOMINAL AORTIC ANEURYSM REPAIR  ~ 1996   w/ iliac aneurysm repair i  . APPENDECTOMY    . CARDIAC CATHETERIZATION  1987   "before OHS"  . CARDIOVASCULAR STRESS TEST  10/13/2009   EF 57%  . CATARACT EXTRACTION W/ INTRAOCULAR LENS  IMPLANT, BILATERAL  09/1999,04/2003   right,left  . COLONOSCOPY    .  CORONARY ARTERY BYPASS GRAFT  01/04/1986   CABG X5  . ESOPHAGOGASTRODUODENOSCOPY (EGD) WITH PROPOFOL N/A 02/22/2016   Procedure: ESOPHAGOGASTRODUODENOSCOPY (EGD) WITH PROPOFOL;  Surgeon: Milus Banister, MD;  Location: [facility];  Service: Endoscopy;  Laterality: N/A;  . INGUINAL HERNIA REPAIR Left 03/07/1983  . JOINT REPLACEMENT    . Knuckles replaced  05/2005   left hand  . NEPHRECTOMY Left 1996  . TONSILLECTOMY    . TOTAL KNEE ARTHROPLASTY Left 05/04/1989  . US ECHOCARDIOGRAPHY  01/14/2007   EF 55-60%    Family History  Problem Relation Age of Onset  . Heart disease Father   . Lymphoma Sister   . Liver cancer Brother   . Lung cancer Brother   . Ulcerative colitis Brother   . Heart disease Brother   .  Diabetes Brother   . Colon cancer Neg Hx   . Prostate cancer Neg Hx   . Stomach cancer Neg Hx   . Esophageal cancer Neg Hx   . Rectal cancer Neg Hx     Social History:  reports that he quit smoking about 44 years ago. His smoking use included Cigarettes. He has a 30.00 pack-year smoking history. He has never used smokeless tobacco. He reports that he does not drink alcohol or use drugs.  Allergies:  Allergies  Allergen Reactions  . Colchicine Other (See Comments)    Reaction:  Unknown     Medications: I have reviewed the patient's current medications.  Results for orders placed or performed during the hospital encounter of 09/17/16 (from the past 48 hour(s))  CBC     Status: Abnormal   Collection Time: 09/20/16  3:31 AM  Result Value Ref Range   WBC 14.4 (H) 4.0 - 10.5 K/uL   RBC 3.21 (L) 4.22 - 5.81 MIL/uL   Hemoglobin 10.5 (L) 13.0 - 17.0 g/dL   HCT 30.5 (L) 39.0 - 52.0 %   MCV 95.0 78.0 - 100.0 fL   MCH 32.7 26.0 - 34.0 pg   MCHC 34.4 30.0 - 36.0 g/dL   RDW 15.8 (H) 11.5 - 15.5 %   Platelets 165 150 - 400 K/uL  Basic metabolic panel     Status: Abnormal   Collection Time: 09/20/16  3:31 AM  Result Value Ref Range   Sodium 135 135 - 145 mmol/L   Potassium 3.3 (L) 3.5 - 5.1 mmol/L   Chloride 100 (L) 101 - 111 mmol/L   CO2 25 22 - 32 mmol/L   Glucose, Bld 248 (H) 65 - 99 mg/dL   BUN 27 (H) 6 - 20 mg/dL   Creatinine, Ser 1.11 0.61 - 1.24 mg/dL   Calcium 8.4 (L) 8.9 - 10.3 mg/dL   GFR calc non Af Amer 58 (L) >60 mL/min   GFR calc Af Amer >60 >60 mL/min    Comment: (NOTE) The eGFR has been calculated using the CKD EPI equation. This calculation has not been validated in all clinical situations. eGFR's persistently <60 mL/min signify possible Chronic Kidney Disease.    Anion gap 10 5 - 15  Magnesium     Status: None   Collection Time: 09/20/16  3:31 AM  Result Value Ref Range   Magnesium 1.8 1.7 - 2.4 mg/dL  HIV antibody     Status: None   Collection Time:  09/20/16  3:31 AM  Result Value Ref Range   HIV Screen 4th Generation wRfx Non Reactive Non Reactive    Comment: (NOTE) Performed At: [facility]  [place], [place] [id number] Lindon Romp MD [id number]   Glucose, capillary     Status: Abnormal   Collection Time: 09/20/16 11:24 PM  Result Value Ref Range   Glucose-Capillary 279 (H) 65 - 99 mg/dL    No results found.  ROS Blood pressure 118/75, pulse 67, temperature 97.8 F (36.6 C), temperature source Oral, resp. rate 16, height 5' 7" (1.702 m), weight 71 kg (156 lb 9 oz), SpO2 99 %. Physical Exam  Constitutional: He appears well-developed and well-nourished.  HENT:  He is awake but seems sedate. He is able to speak only in short sentences and is not capable of given his full history. His nose does not appear to have any lesions and no drainage. Oral cavity/oropharynx no lesions.  Eyes: Pupils are equal, round, and reactive to light. Conjunctivae are normal.  Neck: Normal range of motion. Neck supple.    Assessment/Plan: Left maxillary sinusitis-there is some maxillary and ethmoid thickening and the maxillary sinus is completely opacified with the medial wall bulging with whatever diseases within the maxillary sinus. This very well could be a fungal infection or just a large mucocele/polyp. Infectious disease feels like this should be open to determine and treat since his etiology of his brain abscess really is undetermined. We discussed the situation both with the wife as well as the patient. We discussed endoscopic sinus surgery. We discussed risks, benefits, and options. All the questions are answered and consent was obtained. A fusion CT scan will be ordered as this will be necessary for the surgery. the operating room will be called to try to find an opening for the procedure and will keep the patient and nursing updated.  Melissa Montane 09/21/2016, 9:14 AM

## 2016-09-21 NOTE — Transfer of Care (Cosign Needed)
Immediate Anesthesia Transfer of Care Note  Patient: Derrick Fry  Procedure(s) Performed: Procedure(s): ENDOSCOPIC LEFT SINUS SURGERY WITH NAVIGATION (Left)  Patient Location: PACU  Anesthesia Type:General  Level of Consciousness: awake, alert  and oriented  Airway & Oxygen Therapy: Patient Spontanous Breathing and Patient connected to nasal cannula oxygen  Post-op Assessment: Report given to RN and Post -op Vital signs reviewed and stable  Post vital signs: Reviewed and stable  Last Vitals:  Vitals:   09/21/16 1219 09/21/16 1452  BP: 127/72 (!) 145/82  Pulse: 82 83  Resp: 16 10  Temp: 36.5 C 36.5 C    Last Pain:  Vitals:   09/21/16 1219  TempSrc: Oral  PainSc:          Complications: No apparent anesthesia complications

## 2016-09-21 NOTE — Op Note (Signed)
Preop/postop diagnosis: Left chronic sinusitis Procedure: Left maxillary antrostomy with stripping and anterior ethmoidectomy with fusion Anesthesia: Gen. Estimated blood loss: Less than 10 mL Indications: 83 year old who presents with a brain abscess that etiology is unknown. He is immunosuppressed. He has a opacification of the left maxillary sinus and there is some concern that this is a fungal infection. Infectious disease once the maxillary sinus cultured and opened up. We discussed the procedure and risks, benefits, and options. All questions are answered and consent was obtained. Procedure: Patient is taken to the operating room placed in the supine position after general endotracheal tube anesthesia was prepped and draped in usual sterile manner. The fusion guidance system was positioned calibrated with excellent accuracy. The left cavity was injected with 1% lidocaine with 1 100,000 epinephrine. This was along the septum as well as there was a spur that was in the visualization of the sinus. A [place] Corporal was used to elevate the mucosa of the septum over the spur that was inferior and mid septum. 4 mm osteotome was used to remove the bony portion and Takahashi forceps to remove this as well as a portion of the cartilage. This remove the spur and allowed visualization into the maxillary region. Maxillary sinus area had fungal material coming out of the sinus. This was cleaned out and pieces of it was sent for culture both anaerobic and aerobic. A piece of it was sent for pathology. The antrostomy was opened and the uncinate process was still remaining but a large amount of the medial wall was eroded. The uncinate process was removed up to the attachment ML turbinate region. The maxillary sinus was opened and cleaned out with the microdebrider removing all the fungal elements and purulence. The anterior ethmoid was opened which did not have any fungal material and has had thickened mucosa. Pledgets were placed  with oxymetazoline. There was good hemostasis. Nasal pore soaked in Bactroban was placed into the maxillary sinus and anterior ethmoid. Nasopharynx was suctioned out of all blood and debris. The mucosal flap was laid back down over resected spur area. There was good hemostasis. Patient was awakened brought to recovery in stable condition counts correct.

## 2016-09-21 NOTE — Anesthesia Procedure Notes (Signed)
Procedure Name: Intubation Date/Time: 09/21/2016 1:51 PM Performed by: Rejeana Brock L Pre-anesthesia Checklist: Patient identified, Emergency Drugs available, Suction available and Patient being monitored Patient Re-evaluated:Patient Re-evaluated prior to induction Oxygen Delivery Method: Circle System Utilized Preoxygenation: Pre-oxygenation with 100% oxygen Induction Type: IV induction Ventilation: Mask ventilation without difficulty Laryngoscope Size: Miller and 2 Grade View: Grade I Tube type: Oral Rae Tube size: 7.5 mm Number of attempts: 1 Airway Equipment and Method: Stylet and Oral airway Placement Confirmation: ETT inserted through vocal cords under direct vision,  positive ETCO2 and breath sounds checked- equal and bilateral Secured at: 21 cm Tube secured with: Tape Dental Injury: Teeth and Oropharynx as per pre-operative assessment

## 2016-09-21 NOTE — Anesthesia Preprocedure Evaluation (Signed)
Anesthesia Evaluation  Patient identified by MRN, date of birth, ID band Patient confused    Reviewed: Allergy & Precautions, NPO status , Patient's Chart, lab work & pertinent test results  Airway Mallampati: II   Neck ROM: Full    Dental  (+) Partial Upper, Partial Lower, Dental Advisory Given   Pulmonary former smoker,    breath sounds clear to auscultation       Cardiovascular hypertension,  Rhythm:Regular Rate:Normal     Neuro/Psych    GI/Hepatic   Endo/Other  diabetes  Renal/GU      Musculoskeletal   Abdominal   Peds  Hematology   Anesthesia Other Findings   Reproductive/Obstetrics                             Anesthesia Physical Anesthesia Plan  ASA: III  Anesthesia Plan: General   Post-op Pain Management:    Induction: Intravenous  PONV Risk Score and Plan: Ondansetron  Airway Management Planned: Oral ETT  Additional Equipment:   Intra-op Plan:   Post-operative Plan: Extubation in OR  Informed Consent: I have reviewed the patients History and Physical, chart, labs and discussed the procedure including the risks, benefits and alternatives for the proposed anesthesia with the patient or authorized representative who has indicated his/her understanding and acceptance.   Dental advisory given  Plan Discussed with: CRNA and Anesthesiologist  Anesthesia Plan Comments:         Anesthesia Quick Evaluation

## 2016-09-21 NOTE — Care Management Important Message (Signed)
Important Message  Patient Details  Name: Derrick Fry MRN: [id number] Date of Birth: Jan 28, 1928   Medicare Important Message Given:  Yes    [place] [address] 09/21/2016, 12:17 PM

## 2016-09-21 NOTE — Progress Notes (Signed)
Inpatient Diabetes Program Recommendations  AACE/ADA: New Consensus Statement on Inpatient Glycemic Control (2015)  Target Ranges:  Prepandial:   less than 140 mg/dL      Peak postprandial:   less than 180 mg/dL (1-2 hours)      Critically ill patients:  140 - 180 mg/dL   Lab Results  Component Value Date   GLUCAP 279 (H) 09/20/2016   HGBA1C 6.7 (H) 08/29/2016    Review of Glycemic Control  Diabetes history: DM2 Outpatient Diabetes medications: None  Inpatient Diabetes Program Recommendations:  While on steroids, please consider Novolog correction sensitive scale. Will follow.  Thank you, Nani Gasser. Lovina Zuver, RN, MSN, CDE  Diabetes Coordinator Inpatient Glycemic Control Team Team Pager [phone] (8am-5pm) 09/21/2016 9:06 AM

## 2016-09-21 NOTE — Anesthesia Postprocedure Evaluation (Signed)
Anesthesia Post Note  Patient: Derrick Fry  Procedure(s) Performed: Procedure(s) (LRB): ENDOSCOPIC LEFT SINUS SURGERY WITH NAVIGATION (Left)     Patient location during evaluation: PACU Anesthesia Type: General Level of consciousness: awake, awake and alert and oriented Pain management: pain level controlled Vital Signs Assessment: post-procedure vital signs reviewed and stable Respiratory status: spontaneous breathing, nonlabored ventilation and respiratory function stable Cardiovascular status: blood pressure returned to baseline Anesthetic complications: no    Last Vitals:  Vitals:   09/21/16 1600 09/21/16 1748  BP: (!) 143/81 126/80  Pulse: (!) 48 (!) 51  Resp: 14 16  Temp: (!) 36.4 C 36.6 C    Last Pain:  Vitals:   09/21/16 1748  TempSrc: Oral  PainSc:                  [name]

## 2016-09-21 NOTE — Progress Notes (Addendum)
PROGRESS NOTE    Derrick Fry  LKJ:[id number] DOB: [birth year] DOA: 09/17/2016 PCP: Colon Branch, MD  Outpatient Specialists:     Brief Narrative:  65 ? SNF resident pulm fibrosis CABG 1997 Dr. Redmond Pulling  New ischemic CM Ef 35-40% 09/2016 AAA repair in remote past-?Mesenteric ischemia foll 03/2016 Dr. Donnetta Hutching VVs solitary kidney s/p nephrectomy 1996--base Cr 1.4-1.6  DVT + PE 01/2016 Prior SBO 03/2012-self resolved OSA Diet ctrll Dm+ neuropathy bipolar Psoriatic arthritis on MTx and low dose prednisone   Recent hosp admission ? desc thoracic dissection vs penetrating ulcer  Re-admit 7/156 sudden fever, confusion Eventually fiound to have breain mass IFD consutled-started broad abx Sent to [facility] for ENT consideration as below  Assessment & Plan:   Principal Problem:   Acute metabolic encephalopathy Active Problems:   DM II (diabetes mellitus, type II), controlled ([place])   OSA (obstructive sleep apnea)   CAD (coronary artery disease)   CKD (chronic kidney disease), stage III   AAA (abdominal aortic aneurysm) (HCC)   Normocytic anemia   Pulmonary fibrosis (HCC)   Chronic systolic CHF (congestive heart failure) ([place])   Brain abscess   Fungus ball   Acute encephalopathy with aphasia   Improving. nonverbal prior to admission  Sinus fungal ball vs mucocele in immunosuppressed  Left frontal lobe 65m brain abscess in immunosuppressed, with edema and mass effect  Leukocytosis-multifactorail-infection + on IV steroids  Dr. XErlinda Hongdiscussed with Dr. PAnnette Stable neurosurgery, not surgical candidate, recommended IV antibiotics,  steroids for edema   CT head with extensive opacification in left maxillary antrum with extension medially into the left nasal cavity with partial obstruction of the left nasal cavity.  ENT consulted--aware of patient--getting biopsy  - vanco, rocephin, flagyl, ID following  -Decadron [id number]  -defer rpt imaging for now  Pyuria -Initial concern for UTI, urine  culture negative   CABG 1997 Acute on chronic systolic CHF  Essential hypertension History of AAA, status post repair, descending thoracic aortic ulcer  Echo EF 35-40%, given lasix, improved   Stable today   Continue Lopressor iv 2.5 q6 sched--Will add back orals today 7/19  evaluated by thoracic surgery during last hospitalization from 7/1-7/9, he is deemed not a surgical candidate, he was taken off xarelto.   Hx DVT/PE with IVC filter  Taken off xarelto due to concern for descending thoracic aortic ulcer   CKD stage 3 Solitary kidney s/p nephrectmy 1996 resvoled mild met acidosis -Baseline Cr 1.1 -Stable  Hypokalemia replacing  OSA ?Pulm fibrosis  Psoriatic arth  Stop MTX  Stress dosing decadron  Cover SSI  Steroid-induced hyperglycemia  Continue SSI sensitive and monitor   SCD Full code Inpatient pending ENT input--need biopsy Long discussion and wife at bedside who understands  Consultants:   NS-Dr. PoAnnette StableENT  ID  Procedures:   None yet  Antimicrobials:   As above    Subjective:  Monosyllabic but this is improved per wife No other issue Npo for procedure RN has no new update   Objective: Vitals:   09/21/16 0000 09/21/16 0048 09/21/16 0500 09/21/16 0531  BP:  114/75  118/75  Pulse: 72 (!) 50  67  Resp:  16  16  Temp:  97.9 F (36.6 C)  97.8 F (36.6 C)  TempSrc:  Oral  Oral  SpO2:  99%  99%  Weight:  70.5 kg (155 lb 8 oz) 71 kg (156 lb 9 oz)   Height:  5' 7"  (1.702 m)  Intake/Output Summary (Last 24 hours) at 09/21/16 0740 Last data filed at 09/21/16 0531  Gross per 24 hour  Intake              500 ml  Output              750 ml  Net             -250 ml   Filed Weights   09/20/16 0522 09/21/16 0048 09/21/16 0500  Weight: 65.7 kg (144 lb 12.8 oz) 70.5 kg (155 lb 8 oz) 71 kg (156 lb 9 oz)    Examination:  eomi slight droop on R side mouth No ict no pallor strn 5/5 s 1 s2 no m?r/g Power LE good-5/5, refleaxes  diminsihed bilat abd soft  Data Reviewed: I have personally reviewed following labs and imaging studies  CBC:  Recent Labs Lab 09/17/16 1805 09/18/16 0358 09/19/16 0358 09/20/16 0331  WBC 15.9* 18.3* 13.3* 14.4*  NEUTROABS 13.8* 15.9*  --   --   HGB 9.8* 10.2* 10.0* 10.5*  HCT 29.2* 30.5* 29.2* 30.5*  MCV 97.7 99.0 98.0 95.0  PLT 176 149* 147* 510   Basic Metabolic Panel:  Recent Labs Lab 09/17/16 1805 09/18/16 0358 09/19/16 0358 09/20/16 0331  NA 133* 132* 132* 135  K 4.6 4.4 3.5 3.3*  CL 103 104 101 100*  CO2 21* 19* 20* 25  GLUCOSE 135* 119* 136* 248*  BUN 24* 20 21* 27*  CREATININE 1.18 1.01 1.23 1.11  CALCIUM 9.1 8.4* 8.6* 8.4*  MG  --   --  1.7 1.8   GFR: Estimated Creatinine Clearance: 43.8 mL/min (by C-G formula based on SCr of 1.11 mg/dL). Liver Function Tests:  Recent Labs Lab 09/17/16 1805  AST 38  ALT 34  ALKPHOS 67  BILITOT 0.8  PROT 5.6*  ALBUMIN 2.1*   No results for input(s): LIPASE, AMYLASE in the last 168 hours. No results for input(s): AMMONIA in the last 168 hours. Coagulation Profile: No results for input(s): INR, PROTIME in the last 168 hours. Cardiac Enzymes: No results for input(s): CKTOTAL, CKMB, CKMBINDEX, TROPONINI in the last 168 hours. BNP (last 3 results) No results for input(s): PROBNP in the last 8760 hours. HbA1C: No results for input(s): HGBA1C in the last 72 hours. CBG:  Recent Labs Lab 09/20/16 2324  GLUCAP 279*   Lipid Profile: No results for input(s): CHOL, HDL, LDLCALC, TRIG, CHOLHDL, LDLDIRECT in the last 72 hours. Thyroid Function Tests: No results for input(s): TSH, T4TOTAL, FREET4, T3FREE, THYROIDAB in the last 72 hours. Anemia Panel: No results for input(s): VITAMINB12, FOLATE, FERRITIN, TIBC, IRON, RETICCTPCT in the last 72 hours. Urine analysis:    Component Value Date/Time   COLORURINE YELLOW 09/17/2016 1734   APPEARANCEUR HAZY (A) 09/17/2016 1734   LABSPEC 1.026 09/17/2016 1734   PHURINE  6.0 09/17/2016 1734   GLUCOSEU NEGATIVE 09/17/2016 1734   GLUCOSEU NEGATIVE 10/21/2015 0903   HGBUR NEGATIVE 09/17/2016 1734   BILIRUBINUR NEGATIVE 09/17/2016 1734   KETONESUR 20 (A) 09/17/2016 1734   PROTEINUR 30 (A) 09/17/2016 1734   UROBILINOGEN 0.2 10/21/2015 0903   NITRITE NEGATIVE 09/17/2016 1734   LEUKOCYTESUR TRACE (A) 09/17/2016 1734   Sepsis Labs: @LABRCNTIP (procalcitonin:4,lacticidven:4)  ) Recent Results (from the past 240 hour(s))  Urine culture     Status: None   Collection Time: 09/17/16  5:34 PM  Result Value Ref Range Status   Specimen Description URINE, RANDOM  Final   Special Requests Immunocompromised  Final  Culture   Final    NO GROWTH Performed at [facility], [place] [address] [place], [place] [address]    Report Status 09/20/2016 FINAL  Final  Blood Culture (routine x 2)     Status: None (Preliminary result)   Collection Time: 09/17/16  6:04 PM  Result Value Ref Range Status   Specimen Description BLOOD LEFT WRIST  Final   Special Requests IN PEDIATRIC BOTTLE Blood Culture adequate volume  Final   Culture   Final    NO GROWTH 2 DAYS Performed at [facility], [place] [address], [place], [place] [address]    Report Status PENDING  Incomplete  Blood Culture (routine x 2)     Status: None (Preliminary result)   Collection Time: 09/17/16  6:05 PM  Result Value Ref Range Status   Specimen Description BLOOD RIGHT ARM  Final   Special Requests   Final    BOTTLES DRAWN AEROBIC AND ANAEROBIC Blood Culture adequate volume   Culture   Final    NO GROWTH 2 DAYS Performed at [facility], [place] [address], [place], [place] [address]    Report Status PENDING  Incomplete  Culture, Urine     Status: None   Collection Time: 09/18/16 12:00 PM  Result Value Ref Range Status   Specimen Description URINE, RANDOM  Final   Special Requests   Final    can use urine sample in lab if still suitable for testing, otherwise collect fresh urine sample.    Culture   Final    NO GROWTH Performed at [facility], [place] [address], [place], [place] [address]    Report Status 09/19/2016 FINAL  Final  MRSA PCR Screening     Status: None   Collection Time: 09/18/16  1:30 PM  Result Value Ref Range Status   MRSA by PCR NEGATIVE NEGATIVE Final    Comment:        The GeneXpert MRSA Assay (FDA approved for NASAL specimens only), is one component of a comprehensive MRSA colonization surveillance program. It is not intended to diagnose MRSA infection nor to guide or monitor treatment for MRSA infections.          Radiology Studies: No results found.      Scheduled Meds: . dexamethasone  4 mg Intravenous Q6H  . metoprolol tartrate  2.5 mg Intravenous Q6H  . nystatin  5 mL Oral QID  . sodium chloride flush  3 mL Intravenous Q12H  . cyanocobalamin  100 mcg Oral Daily   Continuous Infusions: . sodium chloride    . sodium chloride Stopped (09/20/16 2044)  . sodium chloride    . amphotericin  B  Liposome (AMBISOME) ADULT IV Stopped (09/20/16 2252)  . cefTRIAXone (ROCEPHIN)  IV Stopped (09/21/16 0059)  . dextrose 0 mL (09/19/16 2000)  . dextrose    . metronidazole Stopped (09/21/16 0306)  . vancomycin Stopped (09/20/16 1005)     LOS: 4 days    Time spent: [place], MD [facility]   If 7PM-7AM, please contact night-coverage [email or web address] Password [email or web address] 09/21/2016, 7:40 AM

## 2016-09-21 NOTE — Progress Notes (Signed)
Returned to room 6n29 from PACU, family at bedside, denies/pain nausea at this time

## 2016-09-21 NOTE — Consult Note (Signed)
   [facility] CM Inpatient Consult   09/21/2016  Derrick Fry 07/16/1927 [id number]    [facility] Care Management follow up.  Bedside visit was on 09/19/16 at [facility]. At that time wife was unsure of discharge plan.   Chart review for follow up. Noted Derrick Fry is currently at [facility] for surgery.   Will continue to follow and re-engage for potential [facility] Care Management services as appropriate.    Marthenia Rolling, MSN-Ed, RN,BSN [facility] Liaison [phone]

## 2016-09-21 NOTE — Progress Notes (Signed)
[facility] for Infectious Disease   Reason for visit: Follow up on brain abscess  Interval History: s/p surgical debridement of sinus by Dr. Janace Hoard; on anidulafungin, ceftriaxone, flagyl, vancomycin.  No chills.  No associated n/v/d.  No rashes.   Physical Exam: Constitutional:  Vitals:   09/21/16 1544 09/21/16 1600  BP: (!) 130/54 (!) 143/81  Pulse: 87 84  Resp: 14 14  Temp: 97.7 F (36.5 C) (!) 97.5 F (36.4 C)   patient appears in NAD Respiratory: Normal respiratory effort; CTA B Cardiovascular: RRR GI: soft, nt, nd  Review of Systems: Gastrointestinal: negative for nausea and diarrhea Integument/breast: negative for rash  Lab Results  Component Value Date   WBC 14.4 (H) 09/20/2016   HGB 10.5 (L) 09/20/2016   HCT 30.5 (L) 09/20/2016   MCV 95.0 09/20/2016   PLT 165 09/20/2016    Lab Results  Component Value Date   CREATININE 1.07 09/21/2016   BUN 28 (H) 09/21/2016   NA 134 (L) 09/21/2016   K 3.5 09/21/2016   CL 104 09/21/2016   CO2 22 09/21/2016    Lab Results  Component Value Date   ALT 26 09/21/2016   AST 33 09/21/2016   ALKPHOS 62 09/21/2016     Microbiology: Recent Results (from the past 240 hour(s))  Urine culture     Status: None   Collection Time: 09/17/16  5:34 PM  Result Value Ref Range Status   Specimen Description URINE, RANDOM  Final   Special Requests Immunocompromised  Final   Culture   Final    NO GROWTH Performed at [facility], [address], [place], [place] [address]    Report Status 09/20/2016 FINAL  Final  Blood Culture (routine x 2)     Status: None (Preliminary result)   Collection Time: 09/17/16  6:04 PM  Result Value Ref Range Status   Specimen Description BLOOD LEFT WRIST  Final   Special Requests IN PEDIATRIC BOTTLE Blood Culture adequate volume  Final   Culture   Final    NO GROWTH 3 DAYS Performed at [facility], [place] [address], [place], [place] [address]    Report Status PENDING  Incomplete    Blood Culture (routine x 2)     Status: None (Preliminary result)   Collection Time: 09/17/16  6:05 PM  Result Value Ref Range Status   Specimen Description BLOOD RIGHT ARM  Final   Special Requests   Final    BOTTLES DRAWN AEROBIC AND ANAEROBIC Blood Culture adequate volume   Culture   Final    NO GROWTH 3 DAYS Performed at [facility], [address] [place], [place] [address]    Report Status PENDING  Incomplete  Culture, Urine     Status: None   Collection Time: 09/18/16 12:00 PM  Result Value Ref Range Status   Specimen Description URINE, RANDOM  Final   Special Requests   Final    can use urine sample in lab if still suitable for testing, otherwise collect fresh urine sample.   Culture   Final    NO GROWTH Performed at [facility], [place] [address] [place], [place] [address]    Report Status 09/19/2016 FINAL  Final  MRSA PCR Screening     Status: None   Collection Time: 09/18/16  1:30 PM  Result Value Ref Range Status   MRSA by PCR NEGATIVE NEGATIVE Final    Comment:  The GeneXpert MRSA Assay (FDA approved for NASAL specimens only), is one component of a comprehensive MRSA colonization surveillance program. It is not intended to diagnose MRSA infection nor to guide or monitor treatment for MRSA infections.   Culture, routine-sinus     Status: None (Preliminary result)   Collection Time: 09/21/16  2:25 PM  Result Value Ref Range Status   Specimen Description ABSCESS  Final   Special Requests   Final    SINUS CONTENTS PT ON ROCEPHIN,NISTATIN,AMPB,VANC,FLAGYL,BACTROBAN   Culture PENDING  Incomplete   Report Status PENDING  Incomplete    Impression/Plan:  1. Brain abscess - on broad coverage.  2. Sinus infection - s/p surgery today.  Waiting for cultures  and will narrow as appropriate

## 2016-09-22 ENCOUNTER — Encounter (HOSPITAL_COMMUNITY): Payer: Self-pay | Admitting: Otolaryngology

## 2016-09-22 DIAGNOSIS — B488 Other specified mycoses: Secondary | ICD-10-CM

## 2016-09-22 DIAGNOSIS — A819 Atypical virus infection of central nervous system, unspecified: Secondary | ICD-10-CM

## 2016-09-22 LAB — COMPREHENSIVE METABOLIC PANEL
ALK PHOS: 52 U/L (ref 38–126)
ALT: 23 U/L (ref 17–63)
AST: 38 U/L (ref 15–41)
Albumin: 1.7 g/dL — ABNORMAL LOW (ref 3.5–5.0)
Anion gap: 8 (ref 5–15)
BUN: 28 mg/dL — ABNORMAL HIGH (ref 6–20)
CALCIUM: 8.5 mg/dL — AB (ref 8.9–10.3)
CO2: 21 mmol/L — ABNORMAL LOW (ref 22–32)
CREATININE: 1.07 mg/dL (ref 0.61–1.24)
Chloride: 106 mmol/L (ref 101–111)
Glucose, Bld: 227 mg/dL — ABNORMAL HIGH (ref 65–99)
Potassium: 3.5 mmol/L (ref 3.5–5.1)
Sodium: 135 mmol/L (ref 135–145)
Total Bilirubin: 1.2 mg/dL (ref 0.3–1.2)
Total Protein: 4.5 g/dL — ABNORMAL LOW (ref 6.5–8.1)

## 2016-09-22 LAB — GLUCOSE, CAPILLARY
GLUCOSE-CAPILLARY: 202 mg/dL — AB (ref 65–99)
Glucose-Capillary: 229 mg/dL — ABNORMAL HIGH (ref 65–99)
Glucose-Capillary: 147 mg/dL — ABNORMAL HIGH (ref 65–99)
Glucose-Capillary: 224 mg/dL — ABNORMAL HIGH (ref 65–99)

## 2016-09-22 LAB — MAGNESIUM: MAGNESIUM: 2.1 mg/dL (ref 1.7–2.4)

## 2016-09-22 MED ORDER — PANTOPRAZOLE SODIUM 40 MG PO TBEC
40.0000 mg | DELAYED_RELEASE_TABLET | Freq: Every day | ORAL | Status: DC
  Administered 2016-09-22 – 2016-09-28 (×7): 40 mg via ORAL
  Filled 2016-09-22 (×7): qty 1

## 2016-09-22 MED ORDER — LORAZEPAM 0.5 MG PO TABS
0.5000 mg | ORAL_TABLET | Freq: Once | ORAL | Status: AC
  Administered 2016-09-23: 0.5 mg via ORAL
  Filled 2016-09-22: qty 1

## 2016-09-22 MED ORDER — HYDRALAZINE HCL 50 MG PO TABS
100.0000 mg | ORAL_TABLET | Freq: Three times a day (TID) | ORAL | Status: DC
  Administered 2016-09-22 – 2016-09-28 (×17): 100 mg via ORAL
  Filled 2016-09-22 (×18): qty 2

## 2016-09-22 MED ORDER — METOPROLOL TARTRATE 50 MG PO TABS
50.0000 mg | ORAL_TABLET | Freq: Two times a day (BID) | ORAL | Status: DC
  Administered 2016-09-22 – 2016-09-28 (×12): 50 mg via ORAL
  Filled 2016-09-22 (×12): qty 1

## 2016-09-22 NOTE — Progress Notes (Addendum)
[facility] for Infectious Disease   Reason for visit: Follow up on fungal sinus and CNS infection  Interval History: no acute events; afebrile, WBC 14.4.  No associated n/v/d.  No rashes.  Day 4 lipid ampho Vancomycin day 6 Ceftriaxone day 4 Metronidazole day 4  Physical Exam: Constitutional:  Vitals:   09/22/16 0546 09/22/16 0925  BP: 132/78 (!) 141/67  Pulse: 75 (!) 45  Resp: 16 16  Temp: 98.2 F (36.8 C) 98.4 F (36.9 C)   patient appears in NAD Respiratory: Normal respiratory effort; CTA B Cardiovascular: RRR GI: soft, nt, nd  Review of Systems: Constitutional: negative for malaise and anorexia Respiratory: negative for cough  Lab Results  Component Value Date   WBC 14.4 (H) 09/20/2016   HGB 10.5 (L) 09/20/2016   HCT 30.5 (L) 09/20/2016   MCV 95.0 09/20/2016   PLT 165 09/20/2016    Lab Results  Component Value Date   CREATININE 1.07 09/22/2016   BUN 28 (H) 09/22/2016   NA 135 09/22/2016   K 3.5 09/22/2016   CL 106 09/22/2016   CO2 21 (L) 09/22/2016    Lab Results  Component Value Date   ALT 23 09/22/2016   AST 38 09/22/2016   ALKPHOS 52 09/22/2016     Microbiology: Recent Results (from the past 240 hour(s))  Urine culture     Status: None   Collection Time: 09/17/16  5:34 PM  Result Value Ref Range Status   Specimen Description URINE, RANDOM  Final   Special Requests Immunocompromised  Final   Culture   Final    NO GROWTH Performed at [facility], [address] [place], [place] [address]    Report Status 09/20/2016 FINAL  Final  Blood Culture (routine x 2)     Status: None (Preliminary result)   Collection Time: 09/17/16  6:04 PM  Result Value Ref Range Status   Specimen Description BLOOD LEFT WRIST  Final   Special Requests IN PEDIATRIC BOTTLE Blood Culture adequate volume  Final   Culture   Final    NO GROWTH 4 DAYS Performed at [facility], [place] [address], [place], [place] [address]    Report Status PENDING   Incomplete  Blood Culture (routine x 2)     Status: None (Preliminary result)   Collection Time: 09/17/16  6:05 PM  Result Value Ref Range Status   Specimen Description BLOOD RIGHT ARM  Final   Special Requests   Final    BOTTLES DRAWN AEROBIC AND ANAEROBIC Blood Culture adequate volume   Culture   Final    NO GROWTH 4 DAYS Performed at [facility], [place] [address], [place], [place] [address]    Report Status PENDING  Incomplete  Culture, Urine     Status: None   Collection Time: 09/18/16 12:00 PM  Result Value Ref Range Status   Specimen Description URINE, RANDOM  Final   Special Requests   Final    can use urine sample in lab if still suitable for testing, otherwise collect fresh urine sample.   Culture   Final    NO GROWTH Performed at [facility], [place] [address] [place],  [address]    Report Status 09/19/2016 FINAL  Final  MRSA PCR Screening     Status: None   Collection Time: 09/18/16  1:30 PM  Result Value Ref Range Status   MRSA by PCR NEGATIVE NEGATIVE Final  Comment:        The GeneXpert MRSA Assay (FDA approved for NASAL specimens only), is one component of a comprehensive MRSA colonization surveillance program. It is not intended to diagnose MRSA infection nor to guide or monitor treatment for MRSA infections.   Culture, routine-sinus     Status: None (Preliminary result)   Collection Time: 09/21/16  2:25 PM  Result Value Ref Range Status   Specimen Description ABSCESS  Final   Special Requests   Final    SINUS CONTENTS PT ON ROCEPHIN,NISTATIN,AMPB,VANC,FLAGYL,BACTROBAN   Culture NO GROWTH < 24 HOURS  Final   Report Status PENDING  Incomplete    Impression/Plan:  1. Fungal sinus/CNS infection - path noted and c/w sinusitis and fungal organisms.  He is on broad coverage as above. Most commonly this is aspergillus but will wait for cultures.  If aspergillus confirmed, will switch to oral voriconazole.   I will d/c vancomycin, unlikely  MRSA I will check beta-d-glucan   2. Medication monitoring - close monitoring of labs including creat, Mg and wnl.  Will continue to monitor

## 2016-09-22 NOTE — Progress Notes (Signed)
PROGRESS NOTE    Derrick Fry  PYK:[id number] DOB: 08-29-1927 DOA: 09/17/2016 PCP: Colon Branch, MD  Outpatient Specialists:     Brief Narrative:  71 ? SNF resident pulm fibrosis CABG 1997 Dr. Redmond Pulling  New ischemic CM Ef 35-40% 09/2016 AAA repair in remote past-?Mesenteric ischemia foll 03/2016 Dr. Donnetta Hutching VVs solitary kidney s/p nephrectomy 1996--base Cr 1.4-1.6  DVT + PE 01/2016 Prior SBO 03/2012-self resolved OSA Diet ctrll Dm+ neuropathy bipolar Psoriatic arthritis on MTx and low dose prednisone   Recent hosp admission ? desc thoracic dissection vs penetrating ulcer  Re-admit 7/156 sudden fever, confusion Eventually fiound to have breain mass IFD consutled-started broad abx Sent to [facility] for ENT consideration as below  Assessment & Plan:   Principal Problem:   Acute metabolic encephalopathy Active Problems:   DM II (diabetes mellitus, type II), controlled ([place])   OSA (obstructive sleep apnea)   CAD (coronary artery disease)   CKD (chronic kidney disease), stage III   AAA (abdominal aortic aneurysm) (HCC)   Normocytic anemia   Pulmonary fibrosis (HCC)   Chronic systolic CHF (congestive heart failure) ([place])   Brain abscess   Fungus ball   Acute encephalopathy with aphasia   Improving  significantly and doing much better   will need skilled placement based on deficits  Sinus fungal ball - possible Aspergillus per ID Left frontal lobe 90m brain abscess in immunosuppressed, with edema and mass effect  Leukocytosis-multifactorail-infection + on IV steroids  Dr. XErlinda Hongdiscussed with Dr. PAnnette Stable neurosurgery, not surgical candidate, recommended IV antibiotics,  Continue steroids for edema   CT head with extensive opacification in left maxillary antrum with extension medially into the left nasal cavity with partial obstruction of the left nasal cavity.  ENT consulted--aware of patient-- patient did have left maxillary antrostomy plus anterior ethmoidectomy + fusion on 7/19 -  vanco discontinued on 7/20,  for now continue rocephin flagyl  as well as amphotericin, ID following  -Decadron [id number] - deferred tapering to neurosurgeon   Pyuria -Initial concern for UTI, urine culture negative   CABG 1997 Acute on chronic systolic CHF  Essential hypertension History of AAA, status post repair, descending thoracic aortic ulcer  Echo EF 35-40%, given lasix, improved   Stable today   Continue Lopressor iv 2.5 q6 sched-- metoprolol 50 ii/day, hydralazine 100 iii/day  resumed 7/20-holding Imdur for now  evaluated by thoracic surgery during last hospitalization from 7/1-7/9, he is deemed not a surgical candidate, he was taken off xarelto.   Hx DVT/PE with IVC filter  Taken off xarelto due to concern for descending thoracic aortic ulcer   CKD stage 3 Solitary kidney s/p nephrectmy 1996 resvoled mild met acidosis -Baseline Cr 1.1 -Stable  Hypokalemia replacing  OSA ?Pulm fibrosis  Psoriatic arth  Stop MTX  Stress dosing decadron as above  Cover SSI with  sliding scale-trend 200-220  Steroid-induced hyperglycemia  Continue SSI sensitive and monitor   not on diabetic meds at home-is on currently Lantus 25 in addition to sliding scale   SCD Full code Inpatient pending ENT input--need biopsy Long discussion and wife at bedside who understands  Consultants:   NS-Dr. PoAnnette StableENT  ID  Procedures:   None yet  Antimicrobials:   As above    Subjective:  much improved Talking in full sentences Slightly slow to respond but seems to be close to baseline Had some lunch today No stool No chest pain   Objective: Vitals:   09/22/16 0100 09/22/16  8270 09/22/16 0925 09/22/16 1310  BP: 124/89 132/78 (!) 141/67 137/68  Pulse: 93 75 (!) 45 88  Resp:  _0 Temp:  98.2 F (36.8 C) 98.4 F (36.9 C) 97.7 F (36.5 C)  TempSrc:  Oral Oral Oral  SpO2:  98% 99% 100%  Weight:      Height:        Intake/Output Summary (Last 24 hours) at  09/22/16 1649 Last data filed at 09/22/16 1500  Gross per 24 hour  Intake              360 ml  Output              800 ml  Net             -440 ml   Filed Weights   09/20/16 0522 09/21/16 0048 09/21/16 0500  Weight: 65.7 kg (144 lb 12.8 oz) 70.5 kg (155 lb 8 oz) 71 kg (156 lb 9 oz)    Examination:  No droop to the right-sided mouth any longer No icterus no pallor Chest clear Moving all 4 limbs with regular power however right upper extremity range of motion is limited by severe arthritis which is chronic for him Abdomen is soft nontender Lower extremities are soft without edema  S1-S2 no murmur rub or gallop  Data Reviewed: I have personally reviewed following labs and imaging studies  CBC:  Recent Labs Lab 09/17/16 1805 09/18/16 0358 09/19/16 0358 09/20/16 0331  WBC 15.9* 18.3* 13.3* 14.4*  NEUTROABS 13.8* 15.9*  --   --   HGB 9.8* 10.2* 10.0* 10.5*  HCT 29.2* 30.5* 29.2* 30.5*  MCV 97.7 99.0 98.0 95.0  PLT 176 149* 147* 786   Basic Metabolic Panel:  Recent Labs Lab 09/18/16 0358 09/19/16 0358 09/20/16 0331 09/21/16 1056 09/22/16 0610  NA 132* 132* 135 134* 135  K 4.4 3.5 3.3* 3.5 3.5  CL 104 101 100* 104 106  CO2 19* 20* 25 22 21*  GLUCOSE 119* 136* 248* 247* 227*  BUN 20 21* 27* 28* 28*  CREATININE 1.01 1.23 1.11 1.07 1.07  CALCIUM 8.4* 8.6* 8.4* 8.6* 8.5*  MG  --  1.7 1.8 2.0 2.1   GFR: Estimated Creatinine Clearance: 45.5 mL/min (by C-G formula based on SCr of 1.07 mg/dL). Liver Function Tests:  Recent Labs Lab 09/17/16 1805 09/21/16 1056 09/22/16 0610  AST 38 33 38  ALT 34 26 23  ALKPHOS 67 62 52  BILITOT 0.8 0.8 1.2  PROT 5.6* 5.1* 4.5*  ALBUMIN 2.1* 2.0* 1.7*   No results for input(s): LIPASE, AMYLASE in the last 168 hours. No results for input(s): AMMONIA in the last 168 hours. Coagulation Profile: No results for input(s): INR, PROTIME in the last 168 hours. Cardiac Enzymes: No results for input(s): CKTOTAL, CKMB, CKMBINDEX,  TROPONINI in the last 168 hours. BNP (last 3 results) No results for input(s): PROBNP in the last 8760 hours. HbA1C: No results for input(s): HGBA1C in the last 72 hours. CBG:  Recent Labs Lab 09/21/16 1455 09/21/16 1714 09/21/16 2124 09/22/16 0749 09/22/16 1250  GLUCAP 211* 221* 209* 202* 229*   Lipid Profile: No results for input(s): CHOL, HDL, LDLCALC, TRIG, CHOLHDL, LDLDIRECT in the last 72 hours. Thyroid Function Tests: No results for input(s): TSH, T4TOTAL, FREET4, T3FREE, THYROIDAB in the last 72 hours. Anemia Panel: No results for input(s): VITAMINB12, FOLATE, FERRITIN, TIBC, IRON, RETICCTPCT in the last 72 hours. Urine analysis:    Component Value Date/Time  COLORURINE YELLOW 09/17/2016 1734   APPEARANCEUR HAZY (A) 09/17/2016 1734   LABSPEC 1.026 09/17/2016 1734   PHURINE 6.0 09/17/2016 1734   GLUCOSEU NEGATIVE 09/17/2016 1734   GLUCOSEU NEGATIVE 10/21/2015 0903   HGBUR NEGATIVE 09/17/2016 1734   BILIRUBINUR NEGATIVE 09/17/2016 1734   KETONESUR 20 (A) 09/17/2016 1734   PROTEINUR 30 (A) 09/17/2016 1734   UROBILINOGEN 0.2 10/21/2015 0903   NITRITE NEGATIVE 09/17/2016 1734   LEUKOCYTESUR TRACE (A) 09/17/2016 1734   Sepsis Labs: _0 (procalcitonin:4,lacticidven:4)  ) Recent Results (from the past 240 hour(s))  Urine culture     Status: None   Collection Time: 09/17/16  5:34 PM  Result Value Ref Range Status   Specimen Description URINE, RANDOM  Final   Special Requests Immunocompromised  Final   Culture   Final    NO GROWTH Performed at [facility], [place] [address] [place], [place] [address]    Report Status 09/20/2016 FINAL  Final  Blood Culture (routine x 2)     Status: None (Preliminary result)   Collection Time: 09/17/16  6:04 PM  Result Value Ref Range Status   Specimen Description BLOOD LEFT WRIST  Final   Special Requests IN PEDIATRIC BOTTLE Blood Culture adequate volume  Final   Culture   Final    NO GROWTH 4 DAYS Performed at  [facility], [place] [address], [place], [place] [address]    Report Status PENDING  Incomplete  Blood Culture (routine x 2)     Status: None (Preliminary result)   Collection Time: 09/17/16  6:05 PM  Result Value Ref Range Status   Specimen Description BLOOD RIGHT ARM  Final   Special Requests   Final    BOTTLES DRAWN AEROBIC AND ANAEROBIC Blood Culture adequate volume   Culture   Final    NO GROWTH 4 DAYS Performed at [facility], [place] [address] [place], [place] [address]    Report Status PENDING  Incomplete  Culture, Urine     Status: None   Collection Time: 09/18/16 12:00 PM  Result Value Ref Range Status   Specimen Description URINE, RANDOM  Final   Special Requests   Final    can use urine sample in lab if still suitable for testing, otherwise collect fresh urine sample.   Culture   Final    NO GROWTH Performed at [facility], [place] [address] [place], [place] [address]    Report Status 09/19/2016 FINAL  Final  MRSA PCR Screening     Status: None   Collection Time: 09/18/16  1:30 PM  Result Value Ref Range Status   MRSA by PCR NEGATIVE NEGATIVE Final    Comment:        The GeneXpert MRSA Assay (FDA approved for NASAL specimens only), is one component of a comprehensive MRSA colonization surveillance program. It is not intended to diagnose MRSA infection nor to guide or monitor treatment for MRSA infections.   Culture, routine-sinus     Status: None (Preliminary result)   Collection Time: 09/21/16  2:25 PM  Result Value Ref Range Status   Specimen Description ABSCESS  Final   Special Requests   Final    SINUS CONTENTS PT ON ROCEPHIN,NISTATIN,AMPB,VANC,FLAGYL,BACTROBAN   Culture NO GROWTH < 24 HOURS  Final   Report Status PENDING  Incomplete         Radiology Studies: Ct Maxillofacial Wo Contrast  Result Date: 09/21/2016 CLINICAL DATA:  Sinusitis.  Brain abscesses. EXAM:  CT MAXILLOFACIAL WITHOUT CONTRAST TECHNIQUE: Multidetector CT images  of the paranasal sinuses were obtained using the standard protocol without intravenous contrast. COMPARISON:  Head CT and MRI 09/18/2016 FINDINGS: Paranasal sinuses: Frontal: Large left frontal sinus which extends across the midline to the right with only minimal mucosal thickening inferiorly. Extremely hypoplastic/ aplastic right frontal sinus. Patent frontal sinus drainage pathways. Ethmoid: Partial opacification of a few anterior/ mid left ethmoid air cells. Clear right ethmoid air cells. Maxillary: Near complete opacification of the left maxillary sinus by relatively homogeneous intermediate density soft tissue which bulges medially into the left nasal cavity. Diffuse left maxillary sinus osteitis is compatible with a component of chronic sinusitis. No osseous erosion is identified. The right maxillary sinus is clear. Sphenoid: Normally aerated. Patent sphenoethmoidal recesses. Right ostiomeatal unit: Patent. Left ostiomeatal unit: Left maxillary sinus material extending through the ostium into the nasal cavity as above. Nasal passages: Leftward nasal septal deviation anteriorly by 7 mm. Asymmetrically small left middle and left inferior nasal turbinates. Paradoxical rotation of the right middle turbinate. Anatomy: No pneumatization superior to anterior ethmoid notches. Intact olfactory grooves and fovea ethmoidalis, Keros I vs II on the right and Keros I on the left. Sellar sphenoid pneumatization pattern. No dehiscence of carotid or optic canals. Bilateral onodi cells are present. Other: Brain findings, including left frontal lobe edema, are better evaluated on recent MRI. Prior bilateral cataract extraction is noted. Calcified atherosclerosis is present at the skullbase. IMPRESSION: Evidence of chronic left maxillary sinusitis with near complete sinus opacification currently. Sinus material bulges into the nasal cavity and may reflect infection (including chronic fungal infections/mycetoma), polyp, or  mucocele. Electronically Signed   By: Logan Bores M.D.   On: 09/21/2016 11:20        Scheduled Meds: . dexamethasone  4 mg Intravenous Q6H  . hydrALAZINE  100 mg Oral Q8H  . insulin aspart  0-15 Units Subcutaneous TID WC  . insulin aspart  3 Units Subcutaneous TID WC  . insulin glargine  25 Units Subcutaneous Daily  . metoprolol tartrate  50 mg Oral BID  . nystatin  5 mL Oral QID  . pantoprazole  40 mg Oral Daily  . sodium chloride flush  3 mL Intravenous Q12H  . cyanocobalamin  100 mcg Oral Daily   Continuous Infusions: . sodium chloride    . sodium chloride Stopped (09/21/16 2118)  . sodium chloride    . amphotericin  B  Liposome (AMBISOME) ADULT IV Stopped (09/21/16 2332)  . cefTRIAXone (ROCEPHIN)  IV Stopped (09/22/16 1139)  . dextrose 0 mL (09/19/16 2000)  . dextrose    . metronidazole 500 mg (09/22/16 1145)     LOS: 5 days    Time spent: [place], MD [facility]   If 7PM-7AM, please contact night-coverage [email or web address] Password [facility] 09/22/2016, 4:49 PM

## 2016-09-22 NOTE — Consult Note (Signed)
   [facility] Inpatient Consult   09/22/2016  Derrick Fry [birth year] [id number]    [facility] Care Management follow up.   Went to bedside to speak with patient and wife to discuss potential [facility] Care Management services on behalf of [name] insurance.  Room filled with visitors. Mrs. Weich Dance movement psychotherapist come back at later time.  Spoke with patient's nurse about PT order.  Will follow up at later time.   Will make inpatient RNCM aware of attempted visit.  Marthenia Rolling, MSN-Ed, RN,BSN [facility] Liaison [phone]

## 2016-09-22 NOTE — Evaluation (Signed)
Physical Therapy Evaluation Patient Details Name: Derrick Fry MRN: [id number] DOB: Apr 08, 1927 Today's Date: 09/22/2016   History of Present Illness  Pt was admitted to [facility] for fever and AMS. Since found fungal sinus and CNS infection. Pt transferred to [facility] for L maxillary antrostomy with stripping and anterior ethmoidectomy with fusion on 7/19. PMH includes DM, AAA s/p repair, CAD, CKD, MI s/p CABG X 5, and pulmonary fibrosis.   Clinical Impression  Pt is s/p surgery above with deficits below. PTA, pt was at [employer] [facility] [employer] and wife reports he was using RW to ambulate around the hallways. Upon eval, pt limited by decreased cognition, weakness, shoulder pain, and decreased balance. Unable to stand with max A and RW, so required use of stedy and Max A +2 to transfer back to bed. Educated about current deficits and need for continued therapies at SNF. Pt and family agreeable. Will continue to follow acutely to maximize functional mobility independence.     Follow Up Recommendations SNF    Equipment Recommendations  None recommended by PT    Recommendations for Other Services OT consult     Precautions / Restrictions Precautions Precautions: Fall Restrictions Weight Bearing Restrictions: No      Mobility  Bed Mobility Overal bed mobility: Needs Assistance Bed Mobility: Sit to Supine       Sit to supine: HOB elevated;Mod assist;+2 for physical assistance   General bed mobility comments: Mod A +2 for trunk descent and assist with LE lifting  Transfers Overall transfer level: Needs assistance Equipment used: Rolling walker (2 wheeled) Transfers: Sit to/from Stand Sit to Stand: Max assist;+2 physical assistance         General transfer comment: Attempted to stand with RW X 2 with max A, however, pt unable to achieve full upright. Required assist +2 to stand with stedy secondary to B shoulder pain. Cues for full upright posture.   Ambulation/Gait             General Gait  Details: NT  Stairs            Wheelchair Mobility    Modified Rankin (Stroke Patients Only)       Balance Overall balance assessment: Needs assistance Sitting-balance support: Feet supported;Bilateral upper extremity supported Sitting balance-Leahy Scale: Poor Sitting balance - Comments: Reliant on BUE support to maintain sitting    Standing balance support: Bilateral upper extremity supported;During functional activity Standing balance-Leahy Scale: Poor Standing balance comment: Reliant on BUE and external support +2 for standing balance.                              Pertinent Vitals/Pain Pain Assessment: No/denies pain    Home Living Family/patient expects to be discharged to:: Skilled nursing facility                      Prior Function Level of Independence: Needs assistance   Gait / Transfers Assistance Needed: Was previously at [facility], however, pt's wife reports pt was ambulating with RW in hallway at [facility] until infection arose.           Hand Dominance   Dominant Hand: Right    Extremity/Trunk Assessment   Upper Extremity Assessment Upper Extremity Assessment: Defer to OT evaluation    Lower Extremity Assessment Lower Extremity Assessment: Generalized weakness (Grossly 2+/5)    Cervical / Trunk Assessment Cervical / Trunk Assessment: Kyphotic  Communication  Communication: No difficulties  Cognition Arousal/Alertness: Awake/alert Behavior During Therapy: Flat affect Overall Cognitive Status: Impaired/Different from baseline Area of Impairment: Problem solving;Awareness;Safety/judgement                         Safety/Judgement: Decreased awareness of deficits;Decreased awareness of safety Awareness: Emergent Problem Solving: Slow processing;Decreased initiation;Requires verbal cues;Requires tactile cues General Comments: Slow processing noted. Required verbal and manual cues to perform transfers.        General Comments General comments (skin integrity, edema, etc.): Pt's wife present through session. Educated about current deficits and need for continued SNF placement. Pt and family agreeable.     Exercises     Assessment/Plan    PT Assessment Patient needs continued PT services  PT Problem List Decreased strength;Decreased activity tolerance;Decreased balance;Decreased mobility;Decreased knowledge of use of DME;Pain       PT Treatment Interventions DME instruction;Gait training;Functional mobility training;Therapeutic activities;Therapeutic exercise;Patient/family education;Balance training;Neuromuscular re-education    PT Goals (Current goals can be found in the Care Plan section)  Acute Rehab PT Goals Patient Stated Goal: to get stronger  PT Goal Formulation: With patient Time For Goal Achievement: 10/06/16 Potential to Achieve Goals: Good    Frequency Min 2X/week   Barriers to discharge        Co-evaluation               AM-PAC PT "6 Clicks" Daily Activity  Outcome Measure Difficulty turning over in bed (including adjusting bedclothes, sheets and blankets)?: Total Difficulty moving from lying on back to sitting on the side of the bed? : Total Difficulty sitting down on and standing up from a chair with arms (e.g., wheelchair, bedside commode, etc,.)?: Total Help needed moving to and from a bed to chair (including a wheelchair)?: Total Help needed walking in hospital room?: Total Help needed climbing 3-5 steps with a railing? : Total 6 Click Score: 6    End of Session Equipment Utilized During Treatment: Gait belt Activity Tolerance: Patient tolerated treatment well Patient left: in bed;with call bell/phone within reach;with bed alarm set;with family/visitor present Nurse Communication: Mobility status PT Visit Diagnosis: Muscle weakness (generalized) (M62.81);Unsteadiness on feet (R26.81);Difficulty in walking, not elsewhere classified (R26.2);Pain Pain  - Right/Left:  (bilat ) Pain - part of body: Shoulder    Time: 8502-7741 PT Time Calculation (min) (ACUTE ONLY): 23 min   Charges:   PT Evaluation $PT Eval Moderate Complexity: 1 Procedure PT Treatments $Therapeutic Activity: 8-22 mins   PT G Codes:        Leighton Ruff, PT, DPT  Acute Rehabilitation Services  Pager: [phone]   Rudean Hitt 09/22/2016, 3:51 PM

## 2016-09-22 NOTE — Progress Notes (Signed)
Pharmacy Antibiotic Note  Derrick Fry is a 83 y.o. male admitted on 09/17/2016 with broad coverage for brain abscess, sinus abscess.  Pharmacy has been consulted for Vancomycin dosing.  S/P sinus surgery on 7/19, with fungal elements noted in maxillary sinus area.  Intraoperative cultures completed.  Patient has been afebrile.   Plan: Continue Vancomycin 1g IV q24 Monitoring Mg, K, and LFTs while on Ambisome F/U culture results Watch renal fxn Vancomycin trough at steady state.  Height: 5\' 7"  (170.2 cm) Weight: 156 lb 9 oz (71 kg) IBW/kg (Calculated) : 66.1  Temp (24hrs), Avg:97.8 F (36.6 C), Min:97.5 F (36.4 C), Max:98.2 F (36.8 C)   Recent Labs Lab 09/17/16 1805 09/17/16 1807 09/18/16 0358 09/19/16 0358 09/20/16 0331 09/21/16 1056 09/22/16 0610  WBC 15.9*  --  18.3* 13.3* 14.4*  --   --   CREATININE 1.18  --  1.01 1.23 1.11 1.07 1.07  LATICACIDVEN  --  0.82  --   --   --   --   --     Estimated Creatinine Clearance: 45.5 mL/min (by C-G formula based on SCr of 1.07 mg/dL).    Allergies  Allergen Reactions  . Colchicine Other (See Comments)    Reaction:  Unknown     Vancomycin 7/17 >>  Rocephin 7/17 >> Flagyl 7/17 >> Ambisome 7/17 >> Eraxis 7/17 >> 7/17 Cefepime 7/15 >> 7/17   7/3 MRSA PCR: neg 7/15 BCx: ntd 7/15 UCx: nf 7/16 UCx nf 7/16 MRSA PCR: neg 7/19 Sinus tissue  Thank you for allowing pharmacy to be a part of this patient's care.  Lewie Chamber., PharmD Clinical Pharmacist [facility] [phone] until 3p, then [phone]

## 2016-09-23 DIAGNOSIS — G9340 Encephalopathy, unspecified: Secondary | ICD-10-CM

## 2016-09-23 LAB — COMPREHENSIVE METABOLIC PANEL
ALBUMIN: 1.8 g/dL — AB (ref 3.5–5.0)
ALK PHOS: 54 U/L (ref 38–126)
ALT: 24 U/L (ref 17–63)
ANION GAP: 6 (ref 5–15)
AST: 26 U/L (ref 15–41)
BUN: 29 mg/dL — ABNORMAL HIGH (ref 6–20)
CALCIUM: 8.7 mg/dL — AB (ref 8.9–10.3)
CO2: 22 mmol/L (ref 22–32)
Chloride: 108 mmol/L (ref 101–111)
Creatinine, Ser: 1.06 mg/dL (ref 0.61–1.24)
GFR calc Af Amer: >60 mL/min (ref >60)
GFR calc non Af Amer: >60 mL/min (ref >60)
GLUCOSE: 102 mg/dL — AB (ref 65–99)
POTASSIUM: 2.8 mmol/L — AB (ref 3.5–5.1)
SODIUM: 136 mmol/L (ref 135–145)
Total Bilirubin: 0.3 mg/dL (ref 0.3–1.2)
Total Protein: 4.5 g/dL — ABNORMAL LOW (ref 6.5–8.1)

## 2016-09-23 LAB — MAGNESIUM: Magnesium: 2 mg/dL (ref 1.7–2.4)

## 2016-09-23 LAB — GLUCOSE, CAPILLARY
GLUCOSE-CAPILLARY: 84 mg/dL (ref 65–99)
GLUCOSE-CAPILLARY: 138 mg/dL — AB (ref 65–99)
GLUCOSE-CAPILLARY: 72 mg/dL (ref 65–99)
Glucose-Capillary: 105 mg/dL — ABNORMAL HIGH (ref 65–99)

## 2016-09-23 LAB — CULTURE, BLOOD (ROUTINE X 2)
CULTURE: NO GROWTH
Culture: NO GROWTH
SPECIAL REQUESTS: ADEQUATE
Special Requests: ADEQUATE

## 2016-09-23 MED ORDER — POTASSIUM CHLORIDE CRYS ER 20 MEQ PO TBCR
40.0000 meq | EXTENDED_RELEASE_TABLET | Freq: Two times a day (BID) | ORAL | Status: DC
  Administered 2016-09-23 – 2016-09-27 (×9): 40 meq via ORAL
  Filled 2016-09-23 (×9): qty 2

## 2016-09-23 NOTE — Progress Notes (Signed)
PROGRESS NOTE    Derrick Fry  KZL:[id number] DOB: 09-03-1927 DOA: 09/17/2016 PCP: Colon Branch, MD  Outpatient Specialists:    Brief Narrative:   27 ? SNF resident pulm fibrosis CABG 1997 Dr. Redmond Pulling  New ischemic CM Ef 35-40% 09/2016 AAA repair in remote past-?Mesenteric ischemia foll 03/2016 Dr. Donnetta Hutching VVs solitary kidney s/p nephrectomy 1996--base Cr 1.4-1.6  DVT + PE 01/2016 Prior SBO 03/2012-self resolved OSA Diet ctrll Dm+ neuropathy bipolar Psoriatic arthritis on MTx and low dose prednisone   Recent hosp admission ? desc thoracic dissection vs penetrating ulcer  Re-admit 7/156 sudden fever, confusion Eventually fiound to have breain mass IFD consutled-started broad abx Sent to [facility] for ENT consideration as below  Assessment & Plan:   Principal Problem:   Acute metabolic encephalopathy Active Problems:   DM II (diabetes mellitus, type II), controlled ([place])   OSA (obstructive sleep apnea)   CAD (coronary artery disease)   CKD (chronic kidney disease), stage III   AAA (abdominal aortic aneurysm) (HCC)   Normocytic anemia   Pulmonary fibrosis (HCC)   Chronic systolic CHF (congestive heart failure) ([place])   Brain abscess   Fungus ball   Acute encephalopathy with aphasia   Improving significantly and doing much better  will need skilled placement based on deficits--will dispo when abx on d/c are known  Sinus fungal ball - 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 XErlinda Hongdiscussed with Dr. PAnnette Stable neurosurgery, not surgical candidate, recommended IV antibiotics,  Continue steroids for  edema   CT head with extensive opacification in left maxillary antrum with extension medially into the left nasal cavity with partial obstruction of the left nasal cavity.  ENT consulted--s/p left maxillary antrostomy plus anterior ethmoidectomy + fusion on  7/19 - vanco discontinued on 7/20, continue rocephin flagyl  as well as amphotericin, ID following  -Decadron [id number] - deferred tapering to neurosurgeon  Pyuria -Initial concern for UTI, urine culture negative   CABG 1997 Acute on chronic systolic CHF  Essential hypertension-slight hypotension 7/21--watch tredns History of AAA, status post repair, descending thoracic aortic ulcer  Echo EF 35-40%, given lasix, improved   Stable today   Continue Lopressor iv 2.5 q6 sched-- metoprolol 50 ii/day, hydralazine 100 iii/day  resumed 7/20-holding Imdur for now  evaluated by thoracic surgery during last hospitalization from 7/1-7/9, he is deemed not a surgical candidate, he was taken off xarelto.   Hx DVT/PE with IVC filter  Taken off xarelto due to concern for descending thoracic aortic ulcer   CKD stage 3 Solitary kidney s/p nephrectomy 1996 resvoled mild met acidosis -Baseline Cr 1.1 -Stable  Hypokalemia 2.2 7/21 Replacing with PO kdur 40 bid  OSA ?Pulm fibrosis  Psoriatic arth  Stop MTX  Stress dosing decadron as above  Cover SSI with  sliding scale-trend 200-220  Steroid-induced hyperglycemia  Continue SSI sensitive and monitor   not on diabetic meds at home-is on currently Lantus 25 in addition to sliding scale   SCD Full code Inpatient pending resulution D/w wife at bedside who understands  Consultants:   NS-Dr. PoAnnette StableENT  ID  Procedures:   None yet  Antimicrobials:   As above    Subjective:  much improved Little sleepy but rousable No new issues Fever neg, no chills Eating fair   Objective: Vitals:   09/22/16 2014 09/23/16 0430 09/23/16 0554 09/23/16 1300  BP: (!Marland Kitchen  143/75 129/67 129/67 (!) 99/44  Pulse: 61 71  63  Resp: _0 Temp: 97.6 F (36.4 C) 97.7 F (36.5 C)  (!) 97.5 F (36.4 C)  TempSrc: Oral Oral  Oral  SpO2: 100% 99%  100%  Weight:  85.3 kg (188 lb)    Height:        Intake/Output Summary (Last 24 hours) at  09/23/16 1619 Last data filed at 09/23/16 1500  Gross per 24 hour  Intake              640 ml  Output              850 ml  Net             -210 ml   Filed Weights   09/21/16 0048 09/21/16 0500 09/23/16 0430  Weight: 70.5 kg (155 lb 8 oz) 71 kg (156 lb 9 oz) 85.3 kg (188 lb)    Examination:  Alert pleasant Anict, no pallor Chest has no added sound No jvd  right upper extremity range of motion is limited by severe arthritis which is chronic for him  S1-S2 no murmur rub or gallop  Data Reviewed: I have personally reviewed following labs and imaging studies  CBC:  Recent Labs Lab 09/17/16 1805 09/18/16 0358 09/19/16 0358 09/20/16 0331  WBC 15.9* 18.3* 13.3* 14.4*  NEUTROABS 13.8* 15.9*  --   --   HGB 9.8* 10.2* 10.0* 10.5*  HCT 29.2* 30.5* 29.2* 30.5*  MCV 97.7 99.0 98.0 95.0  PLT 176 149* 147* 185   Basic Metabolic Panel:  Recent Labs Lab 09/19/16 0358 09/20/16 0331 09/21/16 1056 09/22/16 0610 09/23/16 0311  NA 132* 135 134* 135 136  K 3.5 3.3* 3.5 3.5 2.8*  CL 101 100* 104 106 108  CO2 20* 25 22 21* 22  GLUCOSE 136* 248* 247* 227* 102*  BUN 21* 27* 28* 28* 29*  CREATININE 1.23 1.11 1.07 1.07 1.06  CALCIUM 8.6* 8.4* 8.6* 8.5* 8.7*  MG 1.7 1.8 2.0 2.1 2.0   GFR: Estimated Creatinine Clearance: 51.3 mL/min (by C-G formula based on SCr of 1.06 mg/dL). Liver Function Tests:  Recent Labs Lab 09/17/16 1805 09/21/16 1056 09/22/16 0610 09/23/16 0311  AST 38 33 38 26  ALT 34 _1 ALKPHOS 67 62 52 54  BILITOT 0.8 0.8 1.2 0.3  PROT 5.6* 5.1* 4.5* 4.5*  ALBUMIN 2.1* 2.0* 1.7* 1.8*   No results for input(s): LIPASE, AMYLASE in the last 168 hours. No results for input(s): AMMONIA in the last 168 hours. Coagulation Profile: No results for input(s): INR, PROTIME in the last 168 hours. Cardiac Enzymes: No results for input(s): CKTOTAL, CKMB, CKMBINDEX, TROPONINI in the last 168 hours. BNP (last 3 results) No results for input(s): PROBNP in the last  8760 hours. HbA1C: No results for input(s): HGBA1C in the last 72 hours. CBG:  Recent Labs Lab 09/22/16 1250 09/22/16 1744 09/22/16 2203 09/23/16 0830 09/23/16 1139  GLUCAP 229* 147* 224* 84 138*   Lipid Profile: No results for input(s): CHOL, HDL, LDLCALC, TRIG, CHOLHDL, LDLDIRECT in the last 72 hours. Thyroid Function Tests: No results for input(s): TSH, T4TOTAL, FREET4, T3FREE, THYROIDAB in the last 72 hours. Anemia Panel: No results for input(s): VITAMINB12, FOLATE, FERRITIN, TIBC, IRON, RETICCTPCT in the last 72 hours. Urine analysis:    Component Value Date/Time   COLORURINE YELLOW 09/17/2016 1734   APPEARANCEUR HAZY (A) 09/17/2016 1734   LABSPEC 1.026 09/17/2016 1734  PHURINE 6.0 09/17/2016 1734   GLUCOSEU NEGATIVE 09/17/2016 1734   GLUCOSEU NEGATIVE 10/21/2015 0903   HGBUR NEGATIVE 09/17/2016 1734   BILIRUBINUR NEGATIVE 09/17/2016 1734   KETONESUR 20 (A) 09/17/2016 1734   PROTEINUR 30 (A) 09/17/2016 1734   UROBILINOGEN 0.2 10/21/2015 0903   NITRITE NEGATIVE 09/17/2016 1734   LEUKOCYTESUR TRACE (A) 09/17/2016 1734   Sepsis Labs: _0 (procalcitonin:4,lacticidven:4)  ) Recent Results (from the past 240 hour(s))  Urine culture     Status: None   Collection Time: 09/17/16  5:34 PM  Result Value Ref Range Status   Specimen Description URINE, RANDOM  Final   Special Requests Immunocompromised  Final   Culture   Final    NO GROWTH Performed at [facility], [place] [address], [place], [place] [address]    Report Status 09/20/2016 FINAL  Final  Blood Culture (routine x 2)     Status: None (Preliminary result)   Collection Time: 09/17/16  6:04 PM  Result Value Ref Range Status   Specimen Description BLOOD LEFT WRIST  Final   Special Requests IN PEDIATRIC BOTTLE Blood Culture adequate volume  Final   Culture   Final    NO GROWTH 4 DAYS Performed at [facility], [place] [address] [place], [place] [address]    Report Status PENDING  Incomplete   Blood Culture (routine x 2)     Status: None (Preliminary result)   Collection Time: 09/17/16  6:05 PM  Result Value Ref Range Status   Specimen Description BLOOD RIGHT ARM  Final   Special Requests   Final    BOTTLES DRAWN AEROBIC AND ANAEROBIC Blood Culture adequate volume   Culture   Final    NO GROWTH 4 DAYS Performed at [facility], [place] [address] [place], [place] [address]    Report Status PENDING  Incomplete  Culture, Urine     Status: None   Collection Time: 09/18/16 12:00 PM  Result Value Ref Range Status   Specimen Description URINE, RANDOM  Final   Special Requests   Final    can use urine sample in lab if still suitable for testing, otherwise collect fresh urine sample.   Culture   Final    NO GROWTH Performed at [facility], [place] [address], [place], [place] [address]    Report Status 09/19/2016 FINAL  Final  MRSA PCR Screening     Status: None   Collection Time: 09/18/16  1:30 PM  Result Value Ref Range Status   MRSA by PCR NEGATIVE NEGATIVE Final    Comment:        The GeneXpert MRSA Assay (FDA approved for NASAL specimens only), is one component of a comprehensive MRSA colonization surveillance program. It is not intended to diagnose MRSA infection nor to guide or monitor treatment for MRSA infections.   Anaerobic culture     Status: None (Preliminary result)   Collection Time: 09/21/16  2:25 PM  Result Value Ref Range Status   Specimen Description ABSCESS  Final   Special Requests   Final    SIN CONTENTS ROCEPHIN,NISTATIN,AMPB,VANC,FLAGYL,BACTROBAN   Culture   Final    NO ANAEROBES ISOLATED; CULTURE IN PROGRESS FOR 5 DAYS   Report Status PENDING  Incomplete  Fungus Culture With Stain     Status: None (Preliminary result)   Collection Time: 09/21/16  2:25 PM  Result Value Ref Range Status   Fungus Stain Final report  Final    Comment: (  NOTE) Performed At: [facility] [place], [place] [id number] Lindon Romp MD MW:[id number]    Fungus (Mycology) Culture PENDING  Incomplete   Fungal Source SINUS CONTENTS  Final  Culture, routine-sinus     Status: None (Preliminary result)   Collection Time: 09/21/16  2:25 PM  Result Value Ref Range Status   Specimen Description ABSCESS  Final   Special Requests   Final    SINUS CONTENTS PT ON ROCEPHIN,NISTATIN,AMPB,VANC,FLAGYL,BACTROBAN   Culture NO GROWTH 2 DAYS  Final   Report Status PENDING  Incomplete  Fungus Culture Result     Status: None   Collection Time: 09/21/16  2:25 PM  Result Value Ref Range Status   Result 1 Comment  Final    Comment: (NOTE) KOH/Calcofluor preparation:  no fungus observed. Performed At: [facility] [address] [place], [place] [id number] Lindon Romp MD DG:[id number]          Radiology Studies: No results found.      Scheduled Meds: . dexamethasone  4 mg Intravenous Q6H  . hydrALAZINE  100 mg Oral Q8H  . insulin aspart  0-15 Units Subcutaneous TID WC  . insulin aspart  3 Units Subcutaneous TID WC  . insulin glargine  25 Units Subcutaneous Daily  . metoprolol tartrate  50 mg Oral BID  . pantoprazole  40 mg Oral Daily  . potassium chloride  40 mEq Oral BID  . sodium chloride flush  3 mL Intravenous Q12H  . cyanocobalamin  100 mcg Oral Daily   Continuous Infusions: . sodium chloride    . sodium chloride Stopped (09/22/16 2029)  . sodium chloride    . amphotericin  B  Liposome (AMBISOME) ADULT IV Stopped (09/22/16 2209)  . cefTRIAXone (ROCEPHIN)  IV Stopped (09/23/16 1036)  . dextrose 0 mL (09/19/16 2000)  . dextrose    . metronidazole Stopped (09/23/16 1107)     LOS: 6 days    Time spent: [place], MD [facility]   If 7PM-7AM, please contact night-coverage [email or web address] Password [facility] 09/23/2016, 4:19 PM

## 2016-09-23 NOTE — Progress Notes (Signed)
[facility] for Infectious Disease   Reason for visit: Follow up on CNS abscess  Interval History: no new culture growth, no associated n/v/d; no rashes.  Some sinus drainage and resultant cough.  No fever, no chills.      Physical Exam: Constitutional:  Vitals:   09/23/16 0430 09/23/16 0554  BP: 129/67 129/67  Pulse: 71   Resp: 16   Temp: 97.7 F (36.5 C)    patient appears in NAD Respiratory: Normal respiratory effort; CTA B Cardiovascular: RRR GI: soft, nt, nd  Review of Systems: Respiratory: negative for sputum Musculoskeletal: negative for myalgias and arthralgias  Lab Results  Component Value Date   WBC 14.4 (H) 09/20/2016   HGB 10.5 (L) 09/20/2016   HCT 30.5 (L) 09/20/2016   MCV 95.0 09/20/2016   PLT 165 09/20/2016    Lab Results  Component Value Date   CREATININE 1.06 09/23/2016   BUN 29 (H) 09/23/2016   NA 136 09/23/2016   K 2.8 (L) 09/23/2016   CL 108 09/23/2016   CO2 22 09/23/2016    Lab Results  Component Value Date   ALT 24 09/23/2016   AST 26 09/23/2016   ALKPHOS 54 09/23/2016     Microbiology: Recent Results (from the past 240 hour(s))  Urine culture     Status: None   Collection Time: 09/17/16  5:34 PM  Result Value Ref Range Status   Specimen Description URINE, RANDOM  Final   Special Requests Immunocompromised  Final   Culture   Final    NO GROWTH Performed at [facility], [place] [address] [place], [place] [address]    Report Status 09/20/2016 FINAL  Final  Blood Culture (routine x 2)     Status: None (Preliminary result)   Collection Time: 09/17/16  6:04 PM  Result Value Ref Range Status   Specimen Description BLOOD LEFT WRIST  Final   Special Requests IN PEDIATRIC BOTTLE Blood Culture adequate volume  Final   Culture   Final    NO GROWTH 4 DAYS Performed at [facility], [place] [address], [place], [place] [address]    Report Status PENDING  Incomplete  Blood Culture (routine x 2)     Status: None  (Preliminary result)   Collection Time: 09/17/16  6:05 PM  Result Value Ref Range Status   Specimen Description BLOOD RIGHT ARM  Final   Special Requests   Final    BOTTLES DRAWN AEROBIC AND ANAEROBIC Blood Culture adequate volume   Culture   Final    NO GROWTH 4 DAYS Performed at [facility], [place] [address], [place], [place] [address]    Report Status PENDING  Incomplete  Culture, Urine     Status: None   Collection Time: 09/18/16 12:00 PM  Result Value Ref Range Status   Specimen Description URINE, RANDOM  Final   Special Requests   Final    can use urine sample in lab if still suitable for testing, otherwise collect fresh urine sample.   Culture   Final    NO GROWTH Performed at [facility], [place] [address], [place], [place] [address]    Report Status 09/19/2016 FINAL  Final  MRSA PCR Screening     Status: None   Collection Time: 09/18/16  1:30 PM  Result Value Ref Range Status   MRSA by PCR NEGATIVE NEGATIVE Final    Comment:        The GeneXpert  MRSA Assay (FDA approved for NASAL specimens only), is one component of a comprehensive MRSA colonization surveillance program. It is not intended to diagnose MRSA infection nor to guide or monitor treatment for MRSA infections.   Culture, routine-sinus     Status: None (Preliminary result)   Collection Time: 09/21/16  2:25 PM  Result Value Ref Range Status   Specimen Description ABSCESS  Final   Special Requests   Final    SINUS CONTENTS PT ON ROCEPHIN,NISTATIN,AMPB,VANC,FLAGYL,BACTROBAN   Culture NO GROWTH < 24 HOURS  Final   Report Status PENDING  Incomplete    Impression/Plan:  1. Sinus infection - fungal ball noted on path.  Waiting for cultures.  On amphotericin.    2.  CNS abscess - from #1 and on antifungal.  Will continue ceftriaxone and flagyl for now, pending cultures.    3.  Encephalopathy - resolved now.    I will follow up on Monday

## 2016-09-23 NOTE — Progress Notes (Signed)
Patient requesting for sleep aid medicine, md on call notified, new order received.

## 2016-09-24 ENCOUNTER — Inpatient Hospital Stay (HOSPITAL_COMMUNITY): Payer: PPO

## 2016-09-24 DIAGNOSIS — R609 Edema, unspecified: Secondary | ICD-10-CM

## 2016-09-24 LAB — CULTURE, ROUTINE-SINUS: CULTURE: NO GROWTH

## 2016-09-24 LAB — CBC
HCT: 29.6 % — ABNORMAL LOW (ref 39.0–52.0)
Hemoglobin: 10 g/dL — ABNORMAL LOW (ref 13.0–17.0)
MCH: 32.7 pg (ref 26.0–34.0)
MCHC: 33.8 g/dL (ref 30.0–36.0)
MCV: 96.7 fL (ref 78.0–100.0)
PLATELETS: 107 10*3/uL — AB (ref 150–400)
RBC: 3.06 MIL/uL — ABNORMAL LOW (ref 4.22–5.81)
RDW: 16.6 % — AB (ref 11.5–15.5)
WBC: 14.4 10*3/uL — AB (ref 4.0–10.5)

## 2016-09-24 LAB — COMPREHENSIVE METABOLIC PANEL
ALBUMIN: 1.8 g/dL — AB (ref 3.5–5.0)
ALK PHOS: 56 U/L (ref 38–126)
ALT: 22 U/L (ref 17–63)
AST: 29 U/L (ref 15–41)
Anion gap: 6 (ref 5–15)
BILIRUBIN TOTAL: 0.5 mg/dL (ref 0.3–1.2)
BUN: 36 mg/dL — AB (ref 6–20)
CALCIUM: 8.7 mg/dL — AB (ref 8.9–10.3)
CO2: 19 mmol/L — ABNORMAL LOW (ref 22–32)
CREATININE: 1.23 mg/dL (ref 0.61–1.24)
Chloride: 110 mmol/L (ref 101–111)
GFR calc Af Amer: 59 mL/min — ABNORMAL LOW (ref >60)
GFR, EST NON AFRICAN AMERICAN: 51 mL/min — AB (ref >60)
GLUCOSE: 87 mg/dL (ref 65–99)
POTASSIUM: 3.7 mmol/L (ref 3.5–5.1)
Sodium: 135 mmol/L (ref 135–145)
TOTAL PROTEIN: 4.3 g/dL — AB (ref 6.5–8.1)

## 2016-09-24 LAB — GLUCOSE, CAPILLARY
GLUCOSE-CAPILLARY: 84 mg/dL (ref 65–99)
GLUCOSE-CAPILLARY: 105 mg/dL — AB (ref 65–99)
Glucose-Capillary: 136 mg/dL — ABNORMAL HIGH (ref 65–99)
Glucose-Capillary: 183 mg/dL — ABNORMAL HIGH (ref 65–99)

## 2016-09-24 MED ORDER — ENSURE ENLIVE PO LIQD
237.0000 mL | Freq: Two times a day (BID) | ORAL | Status: DC
  Administered 2016-09-24 – 2016-09-28 (×8): 237 mL via ORAL

## 2016-09-24 MED ORDER — SODIUM CHLORIDE 0.9 % IV SOLN
INTRAVENOUS | Status: DC
  Administered 2016-09-25: 02:00:00 via INTRAVENOUS

## 2016-09-24 MED ORDER — DEXAMETHASONE SODIUM PHOSPHATE 4 MG/ML IJ SOLN
4.0000 mg | Freq: Three times a day (TID) | INTRAMUSCULAR | Status: DC
  Administered 2016-09-24 – 2016-09-26 (×6): 4 mg via INTRAVENOUS
  Filled 2016-09-24 (×6): qty 1

## 2016-09-24 MED ORDER — INSULIN GLARGINE 100 UNIT/ML ~~LOC~~ SOLN
15.0000 [IU] | Freq: Every day | SUBCUTANEOUS | Status: DC
  Filled 2016-09-24: qty 0.15

## 2016-09-24 NOTE — Clinical Social Work Note (Signed)
Clinical Social Work Assessment  Patient Details  Name: Derrick Fry MRN: [id number] Date of Birth: 1927/05/05  Date of referral:  09/24/16               Reason for consult:  Discharge Planning                Permission sought to share information with:  Family Supports Permission granted to share information::  Yes, Verbal Permission Granted  Name::     Derrick Fry  Agency::  SNFs  Relationship::  Wife  Contact Information:  [phone]  Housing/Transportation Living arrangements for the past 2 months:  Single Family Home Source of Information:  Patient Patient Interpreter Needed:  None Criminal Activity/Legal Involvement Pertinent to Current Situation/Hospitalization:  No - Comment as needed Significant Relationships:  Adult Children, Spouse Lives with:  Spouse Do you feel safe going back to the place where you live?  Yes Need for family participation in patient care:  Yes (Comment)  Care giving concerns:  No care giving concerns identified.    Social Worker assessment / plan: CSW met with pt at bedside to address consult for new SNF. CSW introduced self and explained role of social work. Pt from home and lives with wife-Derrick Fry (present at bedside). P/T is recommending STR at SNF and pt agreeable. Pt was discharged from [facility] to [facility] for STR on 09/11/16. Pt does not want to return to [facility]. Pt prefers (1) [facility] or (2) [facility]. Pt agreed for CSW to fax out to [facility], other than [facility].    CSW will send FL-2 to SNFs and follow up on potential bed offer. CSW will continue to follow.   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   Patient/Family's Understanding of and Emotional  Response to Diagnosis, Current Treatment, and Prognosis:  Pt oriented oriented x2. Wife expresses understanding of pt medical state.   Emotional Assessment Appearance:  Appears stated age Attitude/Demeanor/Rapport:  Other (Appropriate) Affect (typically observed):  Accepting, Adaptable Orientation:  Oriented to Self, Oriented to Place Alcohol / Substance use:  Other Psych involvement (Current and /or in the community):  No (Comment)  Discharge Needs  Concerns to be addressed:  Discharge Planning Concerns, Care Coordination Readmission within the last 30 days:  Yes Current discharge risk:  Dependent with Mobility Barriers to Discharge:  Continued Medical Work up   [employer], LCSW 09/24/2016, 4:45 PM

## 2016-09-24 NOTE — Progress Notes (Signed)
VASCULAR LAB PRELIMINARY  PRELIMINARY  PRELIMINARY  PRELIMINARY  Left upper extremity venous  completed.    Preliminary report: There is no DVT noted in the left upper extremity.  There is superficial thrombosis noted in the left cephalic and basilic veins.   Rawley Harju, RVT 09/24/2016, 3:50 PM

## 2016-09-24 NOTE — Progress Notes (Signed)
PROGRESS NOTE    Derrick Fry  KDX:[id number] DOB: 11/06/1927 DOA: 09/17/2016 PCP: Colon Branch, MD  Outpatient Specialists:    Brief Narrative:   80 ? SNF resident pulm fibrosis CABG 1997 Dr. Redmond Pulling  New ischemic CM Ef 35-40% 09/2016 AAA repair in remote past-?Mesenteric ischemia foll 03/2016 Dr. Donnetta Hutching VVs solitary kidney s/p nephrectomy 1996--base Cr 1.4-1.6  DVT + PE 01/2016 Prior SBO 03/2012-self resolved OSA Diet ctrll Dm+ neuropathy bipolar Psoriatic arthritis on MTx and low dose prednisone   Recent hosp admission ? desc thoracic dissection vs penetrating ulcer  Taken off ALL Ac at that time as risk for hemorrhage prohibitive  Re-admit 7/156 sudden fever, confusion Eventually found to have brain mass ID consutled-started broad abx Sent to [facility] for ENT consideration as below  Assessment & Plan:   Principal Problem:   Acute metabolic encephalopathy Active Problems:   DM II (diabetes mellitus, type II), controlled ([place])   OSA (obstructive sleep apnea)   CAD (coronary artery disease)   CKD (chronic kidney disease), stage III   AAA (abdominal aortic aneurysm) (HCC)   Normocytic anemia   Pulmonary fibrosis (HCC)   Chronic systolic CHF (congestive heart failure) ([place])   Brain abscess   Fungus ball   Acute encephalopathy with aphasia   Improving significantly and doing much better  will need skilled placement based on deficits--will dispo when abx on d/c are known  ?LUE DVT  L arm below elbow very swollen since last pm  Get stat LUE Duplex  --Cannot AC given AAA 7.2 cm--will monitor  Sinus fungal ball - possible Aspergillus per ID Left frontal lobe 30m brain abscess in immunosuppressed, with edema and mass effect  Leukocytosis-multifactorail-infection + on IV steroids  Dr. XErlinda Hongd/w Dr. PAnnette Stable neurosurgery, not surgical candidate,    IV antibiotics/  steroids for edema   CT = extensive opacification in left maxillary antrum with extension medially into the left  nasal cavity with partial obstruction of the left nasal cavity.  ENT consulted--s/p left maxillary antrostomy plus anterior ethmoidectomy + fusion on 7/19 - vanc d/c 7/20, continue rocephin flagyl  as well as amphotericin, ID following  -Decadron [id number] - q8hrly 7/22--defer tapering to neurosurgeon  Pyuria -Initial concern for UTI, urine culture negative   CABG 1997 Acute on chronic systolic CHF  Essential hypertension-slight hypotension 7/21--resolved History of AAA, status post repair, descending thoracic aortic ulcer  Echo EF 35-40%, given lasix, improved   Stable today   Continue Lopressor iv 2.5 q6 sched-- metoprolol 50 ii/day, hydralazine 100 iii/day  resumed 7/20-holding Imdur for  now  evaluated by thoracic surgery during last hospitalization from 7/1-7/9, he is deemed not a surgical candidate, he was taken off xarelto.   Hx DVT/PE with IVC filter  Taken off xarelto due to concern for descending thoracic aortic ulcer   CKD stage 3 Solitary kidney s/p nephrectomy 1996 resvoled mild met acidosis On Amphotericin-known to be nephrotoxic -Baseline Cr 1.1-->1.3 Give saline 50 cc/h 7/22 Labs am  Hypokalemia 2.2 7/21 Replacing with PO kdur 40 bid Rpt labs am  OSA ?Pulm fibrosis  Psoriatic arth  Stop MTX  Stress dosing decadron as above  Steroid-induced hyperglycemia  Continue SSI sensitive and monitor   not on diabetic meds at home- Lantus 25-->15 U 7/22 in addition to sliding scale  Cover SSI with  sliding scale-trend 81-105 today  P-E-M  Nutritionist to see  Albumin 1.8  SCD Full code Inpatient pending resulution D/w wife at bedside who  understands  Consultants:   NS-Dr. Annette Stable  ENT  ID  Procedures:   None yet  Antimicrobials:   As above    Subjective:  Less verbal Still awake alert foll commands eating poorly No n/v Wife concerned about LUE swelling Overall has improved  Objective: Vitals:   09/23/16 1300 09/23/16 1700 09/23/16  2159 09/24/16 0559  BP: (!) 99/44 (!) 110/47 (!) 142/53 138/65  Pulse: 63  (!) 59 64  Resp: 16  16 17   Temp: (!) 97.5 F (36.4 C)  97.7 F (36.5 C) 97.9 F (36.6 C)  TempSrc: Oral  Oral Oral  SpO2: 100%  99% 99%  Weight:      Height:        Intake/Output Summary (Last 24 hours) at 09/24/16 1159 Last data filed at 09/24/16 0558  Gross per 24 hour  Intake              110 ml  Output             1325 ml  Net            -1215 ml   Filed Weights   09/21/16 0048 09/21/16 0500 09/23/16 0430  Weight: 70.5 kg (155 lb 8 oz) 71 kg (156 lb 9 oz) 85.3 kg (188 lb)    Examination:  Alert sitting in chair Awake and oriente dto place and person No pallor no ict Neck soft and supple No jvd s1 s 2no m/r/g abd soft LUE sowllen below elbow--no plapable cord Power 5/5 but diminshed  Data Reviewed: I have personally reviewed following labs and imaging studies  CBC:  Recent Labs Lab 09/17/16 1805 09/18/16 0358 09/19/16 0358 09/20/16 0331 09/24/16 0533  WBC 15.9* 18.3* 13.3* 14.4* 14.4*  NEUTROABS 13.8* 15.9*  --   --   --   HGB 9.8* 10.2* 10.0* 10.5* 10.0*  HCT 29.2* 30.5* 29.2* 30.5* 29.6*  MCV 97.7 99.0 98.0 95.0 96.7  PLT 176 149* 147* 165 891*   Basic Metabolic Panel:  Recent Labs Lab 09/19/16 0358 09/20/16 0331 09/21/16 1056 09/22/16 0610 09/23/16 0311 09/24/16 0533  NA 132* 135 134* 135 136 135  K 3.5 3.3* 3.5 3.5 2.8* 3.7  CL 101 100* 104 106 108 110  CO2 20* 25 22 21* 22 19*  GLUCOSE 136* 248* 247* 227* 102* 87  BUN 21* 27* 28* 28* 29* 36*  CREATININE 1.23 1.11 1.07 1.07 1.06 1.23  CALCIUM 8.6* 8.4* 8.6* 8.5* 8.7* 8.7*  MG 1.7 1.8 2.0 2.1 2.0  --    GFR: Estimated Creatinine Clearance: 44.2 mL/min (by C-G formula based on SCr of 1.23 mg/dL). Liver Function Tests:  Recent Labs Lab 09/17/16 1805 09/21/16 1056 09/22/16 0610 09/23/16 0311 09/24/16 0533  AST 38 33 38 26 29  ALT 34 26 23 24 22   ALKPHOS 67 62 52 54 56  BILITOT 0.8 0.8 1.2 0.3 0.5    PROT 5.6* 5.1* 4.5* 4.5* 4.3*  ALBUMIN 2.1* 2.0* 1.7* 1.8* 1.8*   No results for input(s): LIPASE, AMYLASE in the last 168 hours. No results for input(s): AMMONIA in the last 168 hours. Coagulation Profile: No results for input(s): INR, PROTIME in the last 168 hours. Cardiac Enzymes: No results for input(s): CKTOTAL, CKMB, CKMBINDEX, TROPONINI in the last 168 hours. BNP (last 3 results) No results for input(s): PROBNP in the last 8760 hours. HbA1C: No results for input(s): HGBA1C in the last 72 hours. CBG:  Recent Labs Lab 09/23/16 1139 09/23/16 1700 09/23/16  2111 09/24/16 0751 09/24/16 1146  GLUCAP 138* 105* 72 84 105*   Lipid Profile: No results for input(s): CHOL, HDL, LDLCALC, TRIG, CHOLHDL, LDLDIRECT in the last 72 hours. Thyroid Function Tests: No results for input(s): TSH, T4TOTAL, FREET4, T3FREE, THYROIDAB in the last 72 hours. Anemia Panel: No results for input(s): VITAMINB12, FOLATE, FERRITIN, TIBC, IRON, RETICCTPCT in the last 72 hours. Urine analysis:    Component Value Date/Time   COLORURINE YELLOW 09/17/2016 1734   APPEARANCEUR HAZY (A) 09/17/2016 1734   LABSPEC 1.026 09/17/2016 1734   PHURINE 6.0 09/17/2016 1734   GLUCOSEU NEGATIVE 09/17/2016 1734   GLUCOSEU NEGATIVE 10/21/2015 0903   HGBUR NEGATIVE 09/17/2016 1734   BILIRUBINUR NEGATIVE 09/17/2016 1734   KETONESUR 20 (A) 09/17/2016 1734   PROTEINUR 30 (A) 09/17/2016 1734   UROBILINOGEN 0.2 10/21/2015 0903   NITRITE NEGATIVE 09/17/2016 1734   LEUKOCYTESUR TRACE (A) 09/17/2016 1734   Sepsis Labs: @LABRCNTIP (procalcitonin:4,lacticidven:4)  ) Recent Results (from the past 240 hour(s))  Urine culture     Status: None   Collection Time: 09/17/16  5:34 PM  Result Value Ref Range Status   Specimen Description URINE, RANDOM  Final   Special Requests Immunocompromised  Final   Culture   Final    NO GROWTH Performed at [facility], [place] [address] [place], [place] [address]    Report Status  09/20/2016 FINAL  Final  Blood Culture (routine x 2)     Status: None   Collection Time: 09/17/16  6:04 PM  Result Value Ref Range Status   Specimen Description BLOOD LEFT WRIST  Final   Special Requests IN PEDIATRIC BOTTLE Blood Culture adequate volume  Final   Culture   Final    NO GROWTH 5 DAYS Performed at [facility], [place] [address], [place], [place] [address]    Report Status 09/23/2016 FINAL  Final  Blood Culture (routine x 2)     Status: None   Collection Time: 09/17/16  6:05 PM  Result Value Ref Range Status   Specimen Description BLOOD RIGHT ARM  Final   Special Requests   Final    BOTTLES DRAWN AEROBIC AND ANAEROBIC Blood Culture adequate volume   Culture   Final    NO GROWTH 5 DAYS Performed at [facility], [place] [address], [place], [place] [address]    Report Status 09/23/2016 FINAL  Final  Culture, Urine     Status: None   Collection Time: 09/18/16 12:00 PM  Result Value Ref Range Status   Specimen Description URINE, RANDOM  Final   Special Requests   Final    can use urine sample in lab if still suitable for testing, otherwise collect fresh urine sample.   Culture   Final    NO GROWTH Performed at [facility], [place] [address] [place], [place] [address]    Report Status 09/19/2016 FINAL  Final  MRSA PCR Screening     Status: None   Collection Time: 09/18/16  1:30 PM  Result Value Ref Range Status   MRSA by PCR NEGATIVE NEGATIVE Final    Comment:        The GeneXpert MRSA Assay (FDA approved for NASAL specimens only), is one component of a comprehensive MRSA colonization surveillance program. It is not intended to diagnose MRSA infection nor to guide or monitor treatment for MRSA infections.   Anaerobic culture     Status: None (Preliminary result)   Collection Time: 09/21/16  2:25 PM  Result Value Ref Range Status   Specimen Description ABSCESS  Final   Special Requests   Final    SIN CONTENTS  ROCEPHIN,NISTATIN,AMPB,VANC,FLAGYL,BACTROBAN   Culture   Final    NO ANAEROBES ISOLATED; CULTURE IN PROGRESS FOR 5 DAYS   Report Status PENDING  Incomplete  Fungus Culture With Stain     Status: None (Preliminary result)   Collection Time: 09/21/16  2:25 PM  Result Value Ref Range Status   Fungus Stain Final report  Final    Comment: (NOTE) Performed At: [facility] [address] [place], [place] [id number] Lindon Romp MD CH:[id number]    Fungus (Mycology) Culture PENDING  Incomplete   Fungal Source SINUS CONTENTS  Final  Culture, routine-sinus     Status: None (Preliminary result)   Collection Time: 09/21/16  2:25 PM  Result Value Ref Range Status   Specimen Description ABSCESS  Final   Special Requests   Final    SINUS CONTENTS PT ON ROCEPHIN,NISTATIN,AMPB,VANC,FLAGYL,BACTROBAN   Culture NO GROWTH 2 DAYS  Final   Report Status PENDING  Incomplete  Fungus Culture Result     Status: None   Collection Time: 09/21/16  2:25 PM  Result Value Ref Range Status   Result 1 Comment  Final    Comment: (NOTE) KOH/Calcofluor preparation:  no fungus observed. Performed At: [facility] [address] [place], [place] [id number] Lindon Romp MD NO:[id number]      Radiology Studies: No results found.   Scheduled Meds: . dexamethasone  4 mg Intravenous Q6H  . feeding supplement (ENSURE ENLIVE)  237 mL Oral BID BM  . hydrALAZINE  100 mg Oral Q8H  . insulin aspart  0-15 Units Subcutaneous TID WC  . insulin aspart  3 Units Subcutaneous TID WC  . [START ON 09/25/2016] insulin glargine  15 Units Subcutaneous Daily  . metoprolol tartrate  50 mg Oral BID  . pantoprazole  40 mg Oral Daily  . potassium chloride  40 mEq Oral BID  . sodium chloride flush  3 mL Intravenous Q12H  . cyanocobalamin  100 mcg Oral Daily   Continuous Infusions: . sodium chloride    . sodium chloride Stopped (09/23/16 2159)  . sodium chloride    . amphotericin  B  Liposome (AMBISOME)  ADULT IV Stopped (09/23/16 2354)  . cefTRIAXone (ROCEPHIN)  IV Stopped (09/24/16 0947)  . dextrose 0 mL (09/19/16 2000)  . dextrose    . metronidazole 500 mg (09/24/16 0917)     LOS: 7 days    Time spent: [place], MD [facility]   If 7PM-7AM, please contact night-coverage [email or web address] Password [email or web address] 09/24/2016, 11:59 AM

## 2016-09-24 NOTE — Evaluation (Signed)
Occupational Therapy Evaluation Patient Details Name: Derrick Fry MRN: [id number] DOB: 14-May-1927 Today's Date: 09/24/2016    History of Present Illness Pt was [facility] to [facility] for fever and AMS. Since found fungal sinus and CNS infection. Pt transferred to [facility] for L maxillary antrostomy with stripping and anterior ethmoidectomy with fusion on 7/19. PMH includes DM, AAA s/p repair, CAD, CKD, MI s/p CABG X 5, and pulmonary fibrosis.    Clinical Impression   PTA, pt was at a SNF for rehab and his wife reports that he needed assistance with ADLs and was walking with RW short distances. Currently, pt requires Min guard for grooming at EOB and Max A+2 for LB ADLs and toileting. Pt requiring Max VCs and increased time for sequencing and initiating ADLs. Pt would benefit from further acute OT to increase his occupational performance and participation. Recommend dc to SNF for further OT to increase his safety and independence with ADLs and functional mobility; discussed with wife and she agreed however would like to select a different SNF than the one they were at prior.     Follow Up Recommendations  SNF;Supervision/Assistance - 24 hour    Equipment Recommendations  Other (comment) (Defer to next venue)    Recommendations for Other Services PT consult     Precautions / Restrictions Precautions Precautions: Fall Restrictions Weight Bearing Restrictions: No      Mobility Bed Mobility Overal bed mobility: Needs Assistance Bed Mobility: Sit to Supine     Supine to sit: Min assist;+2 for physical assistance;HOB elevated     General bed mobility comments: Pt requring Min A to elevate trunk into upright position  Transfers Overall transfer level: Needs assistance   Transfers: Sit to/from Stand Sit to Stand: Min assist;+2 physical assistance;From elevated surface         General transfer comment: Pt performed sit<>stand with steady and requiring Min A to power into standing. While in  standing, pt had BM and maintained standing position with UE support whiel he finished BM and was cleaned.     Balance Overall balance assessment: Needs assistance Sitting-balance support: No upper extremity supported;Feet supported Sitting balance-Leahy Scale: Fair Sitting balance - Comments: Able to perform bilateral coordination and maintain sitting balance during groomign at EOB   Standing balance support: Bilateral upper extremity supported;During functional activity Standing balance-Leahy Scale: Poor Standing balance comment: Reliant on UE support                           ADL either performed or assessed with clinical judgement   ADL Overall ADL's : Needs assistance/impaired Eating/Feeding: Set up;Sitting   Grooming: Applying deodorant;Wash/dry face;Cueing for sequencing;Minimal assistance;Sitting Grooming Details (indicate cue type and reason): Pt maintain sitting balance at EOB for grooming. Pt required increased VCs to initiate tasks including washing face and applying deotorant.  Upper Body Bathing: Moderate assistance;Sitting   Lower Body Bathing: Maximal assistance;+2 for physical assistance;Sit to/from stand   Upper Body Dressing : Moderate assistance;Sitting   Lower Body Dressing: Maximal assistance;Sit to/from stand       Toileting- Water quality scientist and Hygiene: Maximal assistance;Sit to/from stand;+2 for safety/equipment Toileting - Clothing Manipulation Details (indicate cue type and reason): Pt required Max A to toilet hygiene after havign a BM while standing using steady     Functional mobility during ADLs: Minimal assistance (Steady; sit<>stand) General ADL Comments: Pt demonstrating generalized weakness and decreased initiation of steps within a task. Pt requiring significant amount  of time to complete tasks.      Vision         Perception     Praxis      Pertinent Vitals/Pain Pain Assessment: No/denies pain Pain Intervention(s):  Monitored during session     Hand Dominance Right   Extremity/Trunk Assessment Upper Extremity Assessment Upper Extremity Assessment: Generalized weakness;RUE deficits/detail;LUE deficits/detail RUE Deficits / Details: Pt with decreased strength in bilateral UE and hands (more significant in LUE than RUE) (Would benefit from UE excercises) RUE Coordination: decreased fine motor;decreased gross motor LUE Deficits / Details: Pt with decreased strength in bilateral UE and hands (more significant in LUE than RUE) LUE Coordination: decreased fine motor;decreased gross motor   Lower Extremity Assessment Lower Extremity Assessment: Generalized weakness   Cervical / Trunk Assessment Cervical / Trunk Assessment: Kyphotic   Communication Communication Communication: Other (comment) (Pt requires increased time to answer questions)   Cognition Arousal/Alertness: Awake/alert Behavior During Therapy: Flat affect Overall Cognitive Status: Impaired/Different from baseline Area of Impairment: Problem solving;Awareness;Safety/judgement;Following commands                       Following Commands: Follows one step commands with increased time Safety/Judgement: Decreased awareness of deficits;Decreased awareness of safety Awareness: Emergent Problem Solving: Slow processing;Decreased initiation;Difficulty sequencing;Requires verbal cues;Requires tactile cues General Comments: When asked about name and DOB, pt answered his DOB quickly and then required Max VCs to transition to answerign his name.   General Comments  Pt's wife present through session. Wife communicated that this is not the pt's normal behavior and functional level. She also stated that they did not have a good experience at [facility] and would [facility] to look elsewhere for SNF rehab.  (Swelling in BUEs)    Exercises Exercises: General Upper Extremity General Exercises - Upper Extremity Shoulder Flexion: AAROM;5  reps;Supine;Seated Elbow Flexion: AAROM;Both;Seated   Shoulder Instructions      Home Living Family/patient expects to be discharged to:: Skilled nursing facility Living Arrangements: Spouse/significant other                                      Prior Functioning/Environment Level of Independence: Needs assistance  Gait / Transfers Assistance Needed: Was previously at [facility], however, pt's wife reports pt was ambulating with RW in hallway at [facility] until infection arose. ADL's / Homemaking Assistance Needed: Needed assistance at [facility] for ADLs   Comments: Was at [facility] for rehab        OT Problem List: Decreased strength;Decreased range of motion;Decreased activity tolerance;Impaired balance (sitting and/or standing);Decreased cognition;Decreased safety awareness;Decreased knowledge of use of DME or AE;Impaired UE functional use      OT Treatment/Interventions: Self-care/ADL training;Therapeutic exercise;Energy conservation;DME and/or AE instruction;Therapeutic activities;Cognitive remediation/compensation;Patient/family education    OT Goals(Current goals can be found in the care plan section) Acute Rehab OT Goals Patient Stated Goal: to get stronger  OT Goal Formulation: With patient Time For Goal Achievement: 10/08/16 Potential to Achieve Goals: Good ADL Goals Pt Will Perform Grooming: with set-up;with supervision;sitting (with Min VCs) Pt Will Perform Upper Body Dressing: with min assist;sitting Pt Will Perform Lower Body Dressing: with mod assist;sit to/from stand;with adaptive equipment Pt Will Transfer to Toilet: with mod assist;stand pivot transfer;with +2 assist;bedside commode Pt Will Perform Toileting - Clothing Manipulation and hygiene: with mod assist;with 2+ total assist;sit to/from stand Pt/caregiver will Perform Home Exercise  Program: Increased strength;Both right and left upper extremity;With theraband;With written HEP  provided;With Supervision  OT Frequency: Min 2X/week   Barriers to D/C:            Co-evaluation              AM-PAC PT "6 Clicks" Daily Activity     Outcome Measure Help from another person eating meals?: None Help from another person taking care of personal grooming?: A Lot Help from another person toileting, which includes using toliet, bedpan, or urinal?: Total Help from another person bathing (including washing, rinsing, drying)?: A Lot Help from another person to put on and taking off regular upper body clothing?: A Lot Help from another person to put on and taking off regular lower body clothing?: A Lot 6 Click Score: 13   End of Session Equipment Utilized During Treatment: Gait belt Charlaine Dalton) Nurse Communication: Mobility status  Activity Tolerance: Patient tolerated treatment well;Patient limited by fatigue Patient left: in chair;with call bell/phone within reach;with family/visitor present  OT Visit Diagnosis: Unsteadiness on feet (R26.81);Other abnormalities of gait and mobility (R26.89);Muscle weakness (generalized) (M62.81);Other symptoms and signs involving cognitive function                Time: 1010-1042 OT Time Calculation (min): 32 min Charges:  OT General Charges $OT Visit: 1 Procedure OT Evaluation $OT Eval Moderate Complexity: 1 Procedure OT Treatments $Self Care/Home Management : 8-22 mins G-Codes:     Deane Wattenbarger MSOT, OTR/L Acute Rehab Pager: [phone] Office: [place] 09/24/2016, 12:32 PM

## 2016-09-25 LAB — BASIC METABOLIC PANEL
ANION GAP: 6 (ref 5–15)
BUN: 38 mg/dL — ABNORMAL HIGH (ref 6–20)
CALCIUM: 8.7 mg/dL — AB (ref 8.9–10.3)
CO2: 17 mmol/L — ABNORMAL LOW (ref 22–32)
CREATININE: 1.3 mg/dL — AB (ref 0.61–1.24)
Chloride: 113 mmol/L — ABNORMAL HIGH (ref 101–111)
GFR, EST AFRICAN AMERICAN: 55 mL/min — AB (ref >60)
GFR, EST NON AFRICAN AMERICAN: 48 mL/min — AB (ref >60)
Glucose, Bld: 140 mg/dL — ABNORMAL HIGH (ref 65–99)
Potassium: 4.3 mmol/L (ref 3.5–5.1)
SODIUM: 136 mmol/L (ref 135–145)

## 2016-09-25 LAB — GLUCOSE, CAPILLARY
GLUCOSE-CAPILLARY: 105 mg/dL — AB (ref 65–99)
GLUCOSE-CAPILLARY: 143 mg/dL — AB (ref 65–99)
GLUCOSE-CAPILLARY: 141 mg/dL — AB (ref 65–99)
Glucose-Capillary: 209 mg/dL — ABNORMAL HIGH (ref 65–99)

## 2016-09-25 LAB — MAGNESIUM: MAGNESIUM: 1.9 mg/dL (ref 1.7–2.4)

## 2016-09-25 MED ORDER — STERILE WATER FOR INJECTION IV SOLN
INTRAVENOUS | Status: DC
  Administered 2016-09-25 – 2016-09-26 (×2): via INTRAVENOUS
  Filled 2016-09-25 (×4): qty 850

## 2016-09-25 MED ORDER — AMPHOTERICIN B LIPOSOME 50 MG IV SUSR
400.0000 mg | INTRAVENOUS | Status: DC
  Administered 2016-09-25: 400 mg via INTRAVENOUS
  Filled 2016-09-25 (×2): qty 400

## 2016-09-25 NOTE — Care Management Important Message (Signed)
Important Message  Patient Details  Name: Derrick Fry MRN: [id number] Date of Birth: 22-Dec-1927   Medicare Important Message Given:  Yes    Micayla Brathwaite Montine Circle 09/25/2016, 12:59 PM

## 2016-09-25 NOTE — Progress Notes (Signed)
PROGRESS NOTE    Derrick Fry  HLK:[id number] DOB: 1927-04-02 DOA: 09/17/2016 PCP: Colon Branch, MD  Outpatient Specialists:    Brief Narrative:   72 ? SNF resident pulm fibrosis CABG 1997 Dr. Redmond Pulling  New ischemic CM Ef 35-40% 09/2016 AAA repair in remote past-?Mesenteric ischemia foll 03/2016 Dr. Donnetta Hutching VVs solitary kidney s/p nephrectomy 1996--base Cr 1.4-1.6  DVT + PE 01/2016 Prior SBO 03/2012-self resolved OSA Diet ctrll Dm+ neuropathy bipolar Psoriatic arthritis on MTx and low dose prednisone   Recent hosp admission ? desc thoracic dissection vs penetrating ulcer  Taken off ALL Ac at that time as risk for hemorrhage prohibitive  Re-admit 7/156 sudden fever, confusion Eventually found to have brain mass ID consutled-started broad abx Sent to [facility] for ENT consideration as below  Assessment & Plan:   Principal Problem:   Acute metabolic encephalopathy Active Problems:   DM II (diabetes mellitus, type II), controlled ([place])   OSA (obstructive sleep apnea)   CAD (coronary artery disease)   CKD (chronic kidney disease), stage III   AAA (abdominal aortic aneurysm) (HCC)   Normocytic anemia   Pulmonary fibrosis (HCC)   Chronic systolic CHF (congestive heart failure) ([place])   Brain abscess   Fungus ball   Acute encephalopathy with aphasia   Improving significantly and doing much better  will need skilled placement based on deficits--will dispo when abx on d/c are known  ?LUE DVT  L arm below elbow very swollen since last pm  Left upper extremity Doppler negative for acute DVT, no further workup  Sinus fungal ball - possible Aspergillus per ID- vanc d/c 7/20,  rocephin flagyl discontinued 7/23 Left frontal lobe 56m brain abscess in immunosuppressed, with edema and mass effect  Leukocytosis-multifactorail-infection + on IV steroids  Dr. XErlinda Hongd/w Dr. PAnnette Stable neurosurgery, not surgical candidate,    IV antibiotics/  steroids for edema   CT = extensive opacification in left  maxillary antrum with extension medially into the left nasal cavity with partial obstruction of the left nasal cavity.  ENT consulted--s/p left maxillary antrostomy plus anterior ethmoidectomy + fusion on 7/19   continue amphotericin, ID following--as fungal cultures are pending, we will await final decision regarding this prior to transition to voriconazole   -Decadron [id number] - q8hrly 7/22--defer tapering to neurosurgeon  Pyuria -Initial concern for UTI, urine culture negative   CABG 1997 Acute on chronic systolic CHF  Essential hypertension-slight hypotension 7/21--resolved History of AAA, status post repair, descending thoracic aortic ulcer  Echo EF 35-40%, given lasix, improved   Stable today   Continue Lopressor iv 2.5 q6 sched-- metoprolol 50 ii/day, hydralazine 100 iii/day  resumed 7/20-holding Imdur for now  evaluated by thoracic surgery during last hospitalization from 7/1-7/9, he is deemed not a surgical candidate, he was taken off xarelto.   Hx DVT/PE with IVC filter  Taken off xarelto due to concern for descending thoracic aortic ulcer   CKD stage 3 Solitary kidney s/p nephrectomy 1996  mild met acidosis On Amphotericin-known to be nephrotoxic -Baseline Cr 1.1-->1.3 Change to normal saline to bicarbonate in sterile water 75 cc per hour 7/23 Labs am  Hypokalemia 2.2 7/21 Replacing with PO kdur 40 bid Resolved with replacement  OSA ?Pulm fibrosis  Psoriatic arth  Stop MTX  Stress dosing decadron as above  Steroid-induced hyperglycemia  Continue SSI sensitive and monitor   not on diabetic meds at home- Lantus 25-->15 U 7/22--- discontinued Lantus completely 7/23  Cover SSI with  moderate sliding  scale-trend 140s  P-E-M  Nutritionist to see  Albumin 1.8  SCD Full code Inpatient pending resulution D/w wife at bedside who understands  Consultants:   NS-Dr. Annette Stable  ENT  ID  Procedures:   None yet  Antimicrobials:   As above     Subjective: Alert but monosyllabic 3+ max assist with therapy Not eating much but drinking ensure in life No nausea no vomiting No chest pain    Objective: Vitals:   09/24/16 2112 09/25/16 0500 09/25/16 0615 09/25/16 1432  BP: 131/72  130/64 (!) 155/73  Pulse: 67  61 65  Resp:   16 17  Temp:   98.3 F (36.8 C) (!) 97.2 F (36.2 C)  TempSrc:   Oral Oral  SpO2:   99% 98%  Weight:  86.2 kg (190 lb)    Height:        Intake/Output Summary (Last 24 hours) at 09/25/16 1830 Last data filed at 09/25/16 1812  Gross per 24 hour  Intake             1500 ml  Output             1000 ml  Net              500 ml   Filed Weights   09/21/16 0500 09/23/16 0430 09/25/16 0500  Weight: 71 kg (156 lb 9 oz) 85.3 kg (188 lb) 86.2 kg (190 lb)    Examination:  Alert sitting in chair Oriented 3 Chest clinically clear no added sound Left upper extremity below elbow very swollen however no cord palpated Abdomen soft nontender Neurologically is intact in lower extremities although weak-right upper extremity has limitations secondary to shoulder pathology unable to lift arms above the neck level External ocular movements intact Smile symmetric  Data Reviewed: I have personally reviewed following labs and imaging studies  CBC:  Recent Labs Lab 09/19/16 0358 09/20/16 0331 09/24/16 0533  WBC 13.3* 14.4* 14.4*  HGB 10.0* 10.5* 10.0*  HCT 29.2* 30.5* 29.6*  MCV 98.0 95.0 96.7  PLT 147* 165 334*   Basic Metabolic Panel:  Recent Labs Lab 09/20/16 0331 09/21/16 1056 09/22/16 0610 09/23/16 0311 09/24/16 0533 09/25/16 0251  NA 135 134* 135 136 135 136  K 3.3* 3.5 3.5 2.8* 3.7 4.3  CL 100* 104 106 108 110 113*  CO2 25 22 21* 22 19* 17*  GLUCOSE 248* 247* 227* 102* 87 140*  BUN 27* 28* 28* 29* 36* 38*  CREATININE 1.11 1.07 1.07 1.06 1.23 1.30*  CALCIUM 8.4* 8.6* 8.5* 8.7* 8.7* 8.7*  MG 1.8 2.0 2.1 2.0  --  1.9   GFR: Estimated Creatinine Clearance: 42 mL/min (A) (by C-G  formula based on SCr of 1.3 mg/dL (H)). Liver Function Tests:  Recent Labs Lab 09/21/16 1056 09/22/16 0610 09/23/16 0311 09/24/16 0533  AST 33 38 26 29  ALT 26 23 24 22   ALKPHOS 62 52 54 56  BILITOT 0.8 1.2 0.3 0.5  PROT 5.1* 4.5* 4.5* 4.3*  ALBUMIN 2.0* 1.7* 1.8* 1.8*   No results for input(s): LIPASE, AMYLASE in the last 168 hours. No results for input(s): AMMONIA in the last 168 hours. Coagulation Profile: No results for input(s): INR, PROTIME in the last 168 hours. Cardiac Enzymes: No results for input(s): CKTOTAL, CKMB, CKMBINDEX, TROPONINI in the last 168 hours. BNP (last 3 results) No results for input(s): PROBNP in the last 8760 hours. HbA1C: No results for input(s): HGBA1C in the last 72 hours. CBG:  Recent Labs Lab 09/24/16 1657 09/24/16 2211 09/25/16 0730 09/25/16 1207 09/25/16 1644  GLUCAP 136* 183* 105* 143* 141*   Lipid Profile: No results for input(s): CHOL, HDL, LDLCALC, TRIG, CHOLHDL, LDLDIRECT in the last 72 hours. Thyroid Function Tests: No results for input(s): TSH, T4TOTAL, FREET4, T3FREE, THYROIDAB in the last 72 hours. Anemia Panel: No results for input(s): VITAMINB12, FOLATE, FERRITIN, TIBC, IRON, RETICCTPCT in the last 72 hours. Urine analysis:    Component Value Date/Time   COLORURINE YELLOW 09/17/2016 1734   APPEARANCEUR HAZY (A) 09/17/2016 1734   LABSPEC 1.026 09/17/2016 1734   PHURINE 6.0 09/17/2016 1734   GLUCOSEU NEGATIVE 09/17/2016 1734   GLUCOSEU NEGATIVE 10/21/2015 0903   HGBUR NEGATIVE 09/17/2016 1734   BILIRUBINUR NEGATIVE 09/17/2016 1734   KETONESUR 20 (A) 09/17/2016 1734   PROTEINUR 30 (A) 09/17/2016 1734   UROBILINOGEN 0.2 10/21/2015 0903   NITRITE NEGATIVE 09/17/2016 1734   LEUKOCYTESUR TRACE (A) 09/17/2016 1734   Sepsis Labs: @LABRCNTIP (procalcitonin:4,lacticidven:4)  ) Recent Results (from the past 240 hour(s))  Urine culture     Status: None   Collection Time: 09/17/16  5:34 PM  Result Value Ref Range Status    Specimen Description URINE, RANDOM  Final   Special Requests Immunocompromised  Final   Culture   Final    NO GROWTH Performed at [facility], [place] [address] [place], [place] [address]    Report Status 09/20/2016 FINAL  Final  Blood Culture (routine x 2)     Status: None   Collection Time: 09/17/16  6:04 PM  Result Value Ref Range Status   Specimen Description BLOOD LEFT WRIST  Final   Special Requests IN PEDIATRIC BOTTLE Blood Culture adequate volume  Final   Culture   Final    NO GROWTH 5 DAYS Performed at [facility], [place] [address], [place], [place] [address]    Report Status 09/23/2016 FINAL  Final  Blood Culture (routine x 2)     Status: None   Collection Time: 09/17/16  6:05 PM  Result Value Ref Range Status   Specimen Description BLOOD RIGHT ARM  Final   Special Requests   Final    BOTTLES DRAWN AEROBIC AND ANAEROBIC Blood Culture adequate volume   Culture   Final    NO GROWTH 5 DAYS Performed at [facility], [place] [address], [place], [place] [address]    Report Status 09/23/2016 FINAL  Final  Culture, Urine     Status: None   Collection Time: 09/18/16 12:00 PM  Result Value Ref Range Status   Specimen Description URINE, RANDOM  Final   Special Requests   Final    can use urine sample in lab if still suitable for testing, otherwise collect fresh urine sample.   Culture   Final    NO GROWTH Performed at [facility], [place] [address] [place], [place] [address]    Report Status 09/19/2016 FINAL  Final  MRSA PCR Screening     Status: None   Collection Time: 09/18/16  1:30 PM  Result Value Ref Range Status   MRSA by PCR NEGATIVE NEGATIVE Final    Comment:        The GeneXpert MRSA Assay (FDA approved for NASAL specimens only), is one component of a comprehensive MRSA colonization surveillance program. It is not intended to diagnose MRSA infection nor to guide or monitor treatment for MRSA infections.   Anaerobic culture      Status:  None (Preliminary result)   Collection Time: 09/21/16  2:25 PM  Result Value Ref Range Status   Specimen Description ABSCESS  Final   Special Requests   Final    SIN CONTENTS ROCEPHIN,NISTATIN,AMPB,VANC,FLAGYL,BACTROBAN   Culture   Final    NO ANAEROBES ISOLATED; CULTURE IN PROGRESS FOR 5 DAYS   Report Status PENDING  Incomplete  Fungus Culture With Stain     Status: None (Preliminary result)   Collection Time: 09/21/16  2:25 PM  Result Value Ref Range Status   Fungus Stain Final report  Final    Comment: (NOTE) Performed At: [facility] [address] [place], [place] [id number] Lindon Romp MD CV:[id number]    Fungus (Mycology) Culture PENDING  Incomplete   Fungal Source SINUS CONTENTS  Final  Culture, routine-sinus     Status: None   Collection Time: 09/21/16  2:25 PM  Result Value Ref Range Status   Specimen Description ABSCESS  Final   Special Requests   Final    SINUS CONTENTS PT ON ROCEPHIN,NISTATIN,AMPB,VANC,FLAGYL,BACTROBAN   Culture NO GROWTH 3 DAYS  Final   Report Status 09/24/2016 FINAL  Final  Fungus Culture Result     Status: None   Collection Time: 09/21/16  2:25 PM  Result Value Ref Range Status   Result 1 Comment  Final    Comment: (NOTE) KOH/Calcofluor preparation:  no fungus observed. Performed At: [facility] [address] [place], [place] [id number] Lindon Romp MD EU:[id number]      Radiology Studies: No results found.   Scheduled Meds: . dexamethasone  4 mg Intravenous Q8H  . feeding supplement (ENSURE ENLIVE)  237 mL Oral BID BM  . hydrALAZINE  100 mg Oral Q8H  . insulin aspart  0-15 Units Subcutaneous TID WC  . insulin aspart  3 Units Subcutaneous TID WC  . metoprolol tartrate  50 mg Oral BID  . pantoprazole  40 mg Oral Daily  . potassium chloride  40 mEq Oral BID  . sodium chloride flush  3 mL Intravenous Q12H  . cyanocobalamin  100 mcg Oral Daily   Continuous Infusions: . sodium chloride    .  sodium chloride Stopped (09/24/16 2050)  . sodium chloride    . amphotericin  B  Liposome (AMBISOME) ADULT IV    . dextrose 0 mL (09/19/16 2000)  . dextrose 10 mL (09/24/16 2128)  .  sodium bicarbonate (isotonic) infusion in sterile water 75 mL/hr at 09/25/16 0847     LOS: 8 days    Time spent: [place], MD [facility]   If 7PM-7AM, please contact night-coverage [email or web address] Password [email or web address] 09/25/2016, 6:30 PM

## 2016-09-25 NOTE — Consult Note (Addendum)
   [facility] Inpatient Consult   09/25/2016  DONDI AIME 12-16-1927 [phone]   [facility] Care Management follow up.   Spoke with Mrs. Bhullar at bedside. She states she is awaiting more results. States the discharge plan is for SNF but also states " I just need them to help me figure out what to do". States she still has writer's information from bedside visit when Mr. Miley was at [facility].   Will continue to follow along and re-engage for [facility] Management services when Mrs. Fellner is ready to discuss [facility] Management follow up.   Discussed with covering inpatient RNCM about above. Discussed that Mr. Leighty and family could benefit from goals of care meeting.    Marthenia Rolling, MSN-Ed, RN,BSN [facility] Liaison [phone]

## 2016-09-25 NOTE — NC FL2 (Signed)
[place] LEVEL OF CARE SCREENING TOOL     IDENTIFICATION  Patient Name: ROWLAND ERICSSON Birthdate: [birth year] Sex: male Admission Date (Current Location): 09/17/2016  [facility] and [place] Number:  Herbalist and Address:  [facility], [place] [address], [place], [place] [address]      Provider Number: [id number]  Attending Physician Name and Address:  Nita Sells, MD  Relative Name and Phone Number:       Current Level of Care: Hospital Recommended Level of Care: [place] Prior Approval Number:    Date Approved/Denied:   PASRR Number: [id number]  Discharge Plan: SNF    Current Diagnoses: Patient Active Problem List   Diagnosis Date Noted  . Acute metabolic encephalopathy 62/95/2841  . Brain abscess   . Fungus ball   . Chronic systolic CHF (congestive heart failure) ([place]) 09/17/2016  . Cardiomyopathy ([place]) 09/13/2016  . Hyperkalemia 09/13/2016  . Acute kidney injury ([place]) 09/13/2016  . Decreased cardiac ejection fraction   . Aortic dissection ([place]) 09/03/2016  . Acute bilateral thoracic back pain   . Abdominal pain, chronic, epigastric   . Gastritis and gastroduodenitis   . Diarrhea   . Pulmonary embolus ([place])   . Pulmonary embolism ([place]) 02/09/2016  . History of DVT (deep vein thrombosis) 02/09/2016  . Nausea, vomiting and diarrhea 02/09/2016  . Failure to thrive in adult 02/09/2016  . Hyponatremia 02/09/2016  . Loss of weight 02/09/2016  . Protein calorie malnutrition ([place]) 02/09/2016  . Arrhythmia 02/09/2016  . PCP NOTES >>>>> 11/20/2014  . Buzzing in ear 06/04/2013  . Pulmonary fibrosis ([place]) 01/29/2013  . Normocytic anemia 04/01/2012  . Annual physical exam 07/11/2010  . AAA (abdominal aortic aneurysm) ([place]) 07/11/2010  . Hyperlipidemia 03/23/2009  . Gout 08/22/2007  . GAIT DISTURBANCE 08/22/2007  . Osteoarthritis  04/23/2007  . OSA (obstructive sleep apnea) 01/03/2007  . DM II  (diabetes mellitus, type II), controlled ([place]) 09/05/2006  .  peripheral neuropathy --UDS--pain mngmt  09/05/2006  . Hypertensive heart disease with congestive heart failure ([place]) 09/05/2006  . CAD (coronary artery disease) 09/05/2006  . CKD (chronic kidney disease), stage III 09/05/2006  . SOLITARY KIDNEY, CONGENITAL 09/05/2006    Orientation RESPIRATION BLADDER Height & Weight     Self, Place  Normal Incontinent, External catheter Weight: 190 lb (86.2 kg) Height:  5\' 7"  (170.2 cm)  BEHAVIORAL SYMPTOMS/MOOD NEUROLOGICAL BOWEL NUTRITION STATUS      Incontinent Diet (see DC summary)  AMBULATORY STATUS COMMUNICATION OF NEEDS Skin   Extensive Assist Verbally Normal                       Personal Care Assistance Level of Assistance  Bathing, Dressing Bathing Assistance: Maximum assistance   Dressing Assistance: Maximum assistance     Functional Limitations Info             SPECIAL CARE FACTORS FREQUENCY  PT (By licensed PT), OT (By licensed OT)     PT Frequency: 5/wk OT Frequency: 5/wk            Contractures      Additional Factors Info  Code Status, Allergies, Insulin Sliding Scale Code Status Info: DNR Allergies Info: Colchicine   Insulin Sliding Scale Info: 6/day       Current Medications (09/25/2016):  This is the current hospital active medication list Current Facility-Administered Medications  Medication Dose Route Frequency Provider Last Rate Last Dose  . 0.9 %  sodium chloride infusion  250 mL Intravenous PRN Opyd, Ilene Qua, MD      . 0.9 %  sodium chloride infusion  500 mL Intravenous Q24H Campbell Riches, MD   Stopped at 09/24/16 2050  . 0.9 %  sodium chloride infusion  500 mL Intravenous Q24H Campbell Riches, MD   500 mL at 09/24/16 2321  . acetaminophen (TYLENOL) tablet 650 mg  650 mg Oral Daily PRN Campbell Riches, MD      . amphotericin B liposome (AMBISOME) 400 mg in dextrose 5 % 500 mL IVPB  400 mg Intravenous Q24H Reginia Naas, RPH      . cefTRIAXone (ROCEPHIN) 2 g in dextrose 5 % 50 mL IVPB  2 g Intravenous Q12H Florencia Reasons, MD   Stopped at 09/25/16 1030  . dexamethasone (DECADRON) injection 4 mg  4 mg Intravenous Q8H Nita Sells, MD   4 mg at 09/25/16 0404  . dextrose 5 % bolus 10 mL  10 mL Intravenous Q24H Campbell Riches, MD 0 mL/hr at 09/19/16 2000 10 mL at 09/24/16 2321  . dextrose 5 % solution 10 mL  10 mL Intravenous Q24H Campbell Riches, MD 500 mL/hr at 09/24/16 2128 10 mL at 09/24/16 2128  . diphenhydrAMINE (BENADRYL) injection 25 mg  25 mg Intravenous Daily PRN Campbell Riches, MD      . feeding supplement (ENSURE ENLIVE) (ENSURE ENLIVE) liquid 237 mL  237 mL Oral BID BM Nita Sells, MD   237 mL at 09/25/16 0848  . hydrALAZINE (APRESOLINE) tablet 100 mg  100 mg Oral Q8H Nita Sells, MD   100 mg at 09/25/16 0615  . insulin aspart (novoLOG) injection 0-15 Units  0-15 Units Subcutaneous TID WC Nita Sells, MD   Stopped at 09/24/16 1200  . insulin aspart (novoLOG) injection 3 Units  3 Units Subcutaneous TID WC Nita Sells, MD   Stopped at 09/24/16 0800  . ipratropium-albuterol (DUONEB) 0.5-2.5 (3) MG/3ML nebulizer solution 3 mL  3 mL Nebulization Q6H PRN Opyd, Ilene Qua, MD      . lidocaine (XYLOCAINE) 2 % jelly 1 application  1 application Topical Once PRN Izora Gala, MD      . lidocaine (XYLOCAINE) 4 % external solution 0-50 mL  0-50 mL Topical Once PRN Izora Gala, MD      . lidocaine-EPINEPHrine (XYLOCAINE-EPINEPHrine) 1 %-1:200000 (PF) injection 0-30 mL  0-30 mL Intradermal Once PRN Izora Gala, MD      . metoprolol tartrate (LOPRESSOR) tablet 50 mg  50 mg Oral BID Nita Sells, MD   50 mg at 09/25/16 0841  . metroNIDAZOLE (FLAGYL) IVPB 500 mg  500 mg Intravenous Blair Promise, MD   Stopped at 09/25/16 1030  . ondansetron (ZOFRAN) injection 4 mg  4 mg Intravenous Q6H PRN Opyd, Ilene Qua, MD      . oxymetazoline (AFRIN) 0.05 % nasal spray 1  spray  1 spray Each Nare Once PRN Izora Gala, MD      . pantoprazole (PROTONIX) EC tablet 40 mg  40 mg Oral Daily Nita Sells, MD   40 mg at 09/25/16 0842  . potassium chloride SA (K-DUR,KLOR-CON) CR tablet 40 mEq  40 mEq Oral BID Nita Sells, MD   40 mEq at 09/25/16 0842  . silver nitrate applicators applicator 1 Stick  1 Stick Topical Once PRN Izora Gala, MD      . sodium bicarbonate 150 mEq in sterile water 1,000 mL infusion   Intravenous Continuous  Nita Sells, MD 75 mL/hr at 09/25/16 [phone]    . sodium chloride flush (NS) 0.9 % injection 3 mL  3 mL Intravenous Q12H Opyd, Ilene Qua, MD   3 mL at 09/22/16 2012  . sodium chloride flush (NS) 0.9 % injection 3 mL  3 mL Intravenous PRN Opyd, Ilene Qua, MD      . TRIPLE ANTIBIOTIC [phone] OINT 1 application  1 application Apply externally Once PRN Izora Gala, MD      . vitamin B-12 (CYANOCOBALAMIN) tablet 100 mcg  100 mcg Oral Daily Opyd, Ilene Qua, MD   100 mcg at 09/25/16 6381     Discharge Medications: Please see discharge summary for a list of discharge medications.  Relevant Imaging Results:  Relevant Lab Results:   Additional Information SSN:  [id number]  Jorge Ny, LCSW

## 2016-09-25 NOTE — Progress Notes (Signed)
Physical Therapy Treatment Patient Details Name: Derrick Fry MRN: [id number] DOB: 09/16/1927 Today's Date: 09/25/2016    History of Present Illness Pt was admitted to [facility] for fever and AMS. Since found fungal sinus and CNS infection. Pt transferred to [facility] for L maxillary antrostomy with stripping and anterior ethmoidectomy with fusion on 7/19. PMH includes DM, AAA s/p repair, CAD, CKD, MI s/p CABG X 5, and pulmonary fibrosis.     PT Comments    Unsure of pt's cognition, when PTA tried to address pt initially upon entering room pt not responding, made several attempts (pt made eye contact but did not respond verbally). During entire Tx pt was slow to respond to questions/direction. Pt slowly progressing towards goals, limited by fatigue and weakness. Pt declined in function since session with mobility tech this AM. Pt required Max Assist of 2-3 with Stedy. Pt requires Verbal/Tactile Cues for bed mobility and transfers, did well with exercise and direction. Next Tx to initiate gait training and continue exercise program.    Follow Up Recommendations  SNF     Equipment Recommendations  None recommended by PT    Recommendations for Other Services OT consult     Precautions / Restrictions Precautions Precautions: Fall Restrictions Weight Bearing Restrictions: No RLE Weight Bearing: Weight bearing as tolerated    Mobility  Bed Mobility Overal bed mobility: Needs Assistance Bed Mobility: Sit to Supine       Sit to supine: Max assist;+2 for physical assistance   General bed mobility comments: Pt required min A with VC's to scoot hips to middle of bed, elevate feet onto bed.   Transfers Overall transfer level: Needs assistance   Transfers: Sit to/from Stand Sit to Stand: Max assist;+2 physical assistance;From elevated surface         General transfer comment: Pt required max assist of 3 to boost into standing from chair to stedy. Pt required VC's for sequencing/technique with  stedy, posture, foot placement. Pt stood in stedy for 20 secs X2.   Ambulation/Gait                 Stairs            Wheelchair Mobility    Modified Rankin (Stroke Patients Only)       Balance Overall balance assessment: Needs assistance Sitting-balance support: Bilateral upper extremity supported;Feet supported Sitting balance-Leahy Scale: Poor     Standing balance support: Bilateral upper extremity supported;During functional activity Standing balance-Leahy Scale: Poor                              Cognition Arousal/Alertness: Lethargic Behavior During Therapy: Flat affect Overall Cognitive Status: Impaired/Different from baseline Area of Impairment: Awareness;Following commands;Problem solving                       Following Commands: Follows one step commands inconsistently   Awareness: Anticipatory Problem Solving: Slow processing;Decreased initiation;Requires verbal cues;Requires tactile cues;Difficulty sequencing General Comments: Pt not very verbal during Tx, Difficulty following commands       Exercises General Exercises - Lower Extremity Ankle Circles/Pumps: AROM;20 reps;Both;Supine Quad Sets: AROM;10 reps;Both;Supine Heel Slides: AAROM;Both;10 reps;Supine    General Comments        Pertinent Vitals/Pain Pain Assessment: No/denies pain Pain Intervention(s): Limited activity within patient's tolerance;Monitored during session;Repositioned    Home Living  Prior Function            PT Goals (current goals can now be found in the care plan section) Acute Rehab PT Goals Patient Stated Goal: to get better PT Goal Formulation: With patient Potential to Achieve Goals: Good Progress towards PT goals: Progressing toward goals    Frequency    Min 2X/week      PT Plan Current plan remains appropriate    Co-evaluation              AM-PAC PT "6 Clicks" Daily Activity  Outcome  Measure  Difficulty turning over in bed (including adjusting bedclothes, sheets and blankets)?: Total Difficulty moving from lying on back to sitting on the side of the bed? : Total Difficulty sitting down on and standing up from a chair with arms (e.g., wheelchair, bedside commode, etc,.)?: Total Help needed moving to and from a bed to chair (including a wheelchair)?: Total Help needed walking in hospital room?: Total Help needed climbing 3-5 steps with a railing? : Total 6 Click Score: 6    End of Session Equipment Utilized During Treatment: Gait belt Activity Tolerance: Patient limited by fatigue Patient left: in bed;with call bell/phone within reach;with family/visitor present Nurse Communication: Mobility status PT Visit Diagnosis: Muscle weakness (generalized) (M62.81);Unsteadiness on feet (R26.81);Difficulty in walking, not elsewhere classified (R26.2);Pain Pain - part of body: Shoulder     Time: 1400-1425 PT Time Calculation (min) (ACUTE ONLY): 25 min  Charges:  $Therapeutic Exercise: 8-22 mins $Therapeutic Activity: 8-22 mins                    G Codes:       Bevan Vu, SPTA [phone]   Shuntae Herzig 09/25/2016, 3:56 PM

## 2016-09-25 NOTE — Progress Notes (Signed)
[place] for Infectious Disease   Reason for visit: Follow up on fungal sinus/CNS infection  Interval History: no new culture growth, no fever.  Creat elevated some today.  Receiving fluid boluses.  No associated n/v/d. No rashes.  Family at bedside  Physical Exam: Constitutional:  Vitals:   09/25/16 0615 09/25/16 1432  BP: 130/64 (!) 155/73  Pulse: 61 65  Resp: 16 17  Temp: 98.3 F (36.8 C) (!) 97.2 F (36.2 C)   patient appears in NAD Eyes: anicteric HENT: mm moist Respiratory: Normal respiratory effort; CTA B Cardiovascular: RRR   Review of Systems: Unable to be assessed due to mental status  Lab Results  Component Value Date   WBC 14.4 (H) 09/24/2016   HGB 10.0 (L) 09/24/2016   HCT 29.6 (L) 09/24/2016   MCV 96.7 09/24/2016   PLT 107 (L) 09/24/2016    Lab Results  Component Value Date   CREATININE 1.30 (H) 09/25/2016   BUN 38 (H) 09/25/2016   NA 136 09/25/2016   K 4.3 09/25/2016   CL 113 (H) 09/25/2016   CO2 17 (L) 09/25/2016    Lab Results  Component Value Date   ALT 22 09/24/2016   AST 29 09/24/2016   ALKPHOS 56 09/24/2016     Microbiology: Recent Results (from the past 240 hour(s))  Urine culture     Status: None   Collection Time: 09/17/16  5:34 PM  Result Value Ref Range Status   Specimen Description URINE, RANDOM  Final   Special Requests Immunocompromised  Final   Culture   Final    NO GROWTH Performed at [facility], [place] [address] [place], [place] [address]    Report Status 09/20/2016 FINAL  Final  Blood Culture (routine x 2)     Status: None   Collection Time: 09/17/16  6:04 PM  Result Value Ref Range Status   Specimen Description BLOOD LEFT WRIST  Final   Special Requests IN PEDIATRIC BOTTLE Blood Culture adequate volume  Final   Culture   Final    NO GROWTH 5 DAYS Performed at [facility], [place] [address], [place], [place] [address]    Report Status 09/23/2016 FINAL  Final  Blood Culture (routine x 2)      Status: None   Collection Time: 09/17/16  6:05 PM  Result Value Ref Range Status   Specimen Description BLOOD RIGHT ARM  Final   Special Requests   Final    BOTTLES DRAWN AEROBIC AND ANAEROBIC Blood Culture adequate volume   Culture   Final    NO GROWTH 5 DAYS Performed at [facility], [place] [address] [place], [place] [address]    Report Status 09/23/2016 FINAL  Final  Culture, Urine     Status: None   Collection Time: 09/18/16 12:00 PM  Result Value Ref Range Status   Specimen Description URINE, RANDOM  Final   Special Requests   Final    can use urine sample in lab if still suitable for testing, otherwise collect fresh urine sample.   Culture   Final    NO GROWTH Performed at [facility], [place] [address], [place], [place] [address]    Report Status 09/19/2016 FINAL  Final  MRSA PCR Screening     Status: None   Collection Time: 09/18/16  1:30 PM  Result Value Ref Range Status   MRSA by PCR NEGATIVE NEGATIVE Final    Comment:  The GeneXpert MRSA Assay (FDA approved for NASAL specimens only), is one component of a comprehensive MRSA colonization surveillance program. It is not intended to diagnose MRSA infection nor to guide or monitor treatment for MRSA infections.   Anaerobic culture     Status: None (Preliminary result)   Collection Time: 09/21/16  2:25 PM  Result Value Ref Range Status   Specimen Description ABSCESS  Final   Special Requests   Final    SIN CONTENTS ROCEPHIN,NISTATIN,AMPB,VANC,FLAGYL,BACTROBAN   Culture   Final    NO ANAEROBES ISOLATED; CULTURE IN PROGRESS FOR 5 DAYS   Report Status PENDING  Incomplete  Fungus Culture With Stain     Status: None (Preliminary result)   Collection Time: 09/21/16  2:25 PM  Result Value Ref Range Status   Fungus Stain Final report  Final    Comment: (NOTE) Performed At: [facility] [address] [place], [place] [id number] Lindon Romp MD OY:[id number]    Fungus (Mycology) Culture  PENDING  Incomplete   Fungal Source SINUS CONTENTS  Final  Culture, routine-sinus     Status: None   Collection Time: 09/21/16  2:25 PM  Result Value Ref Range Status   Specimen Description ABSCESS  Final   Special Requests   Final    SINUS CONTENTS PT ON ROCEPHIN,NISTATIN,AMPB,VANC,FLAGYL,BACTROBAN   Culture NO GROWTH 3 DAYS  Final   Report Status 09/24/2016 FINAL  Final  Fungus Culture Result     Status: None   Collection Time: 09/21/16  2:25 PM  Result Value Ref Range Status   Result 1 Comment  Final    Comment: (NOTE) KOH/Calcofluor preparation:  no fungus observed. Performed At: [facility] [place], [place] [id number] Lindon Romp MD GG:[id number]     Impression/Plan:  1. Sinus/CNS fungal infection - no growth.  I will discuss case with pathology.  I will stop ceftriaxone, flagyl  2. Medication monitoring - monitor creat.  Fluid boluses.

## 2016-09-26 LAB — GLUCOSE, CAPILLARY
GLUCOSE-CAPILLARY: 152 mg/dL — AB (ref 65–99)
GLUCOSE-CAPILLARY: 166 mg/dL — AB (ref 65–99)
Glucose-Capillary: 204 mg/dL — ABNORMAL HIGH (ref 65–99)
Glucose-Capillary: 186 mg/dL — ABNORMAL HIGH (ref 65–99)

## 2016-09-26 LAB — ANAEROBIC CULTURE

## 2016-09-26 LAB — BASIC METABOLIC PANEL
Anion gap: 5 (ref 5–15)
BUN: 37 mg/dL — AB (ref 6–20)
CALCIUM: 9 mg/dL (ref 8.9–10.3)
CO2: 21 mmol/L — ABNORMAL LOW (ref 22–32)
Chloride: 109 mmol/L (ref 101–111)
Creatinine, Ser: 1.25 mg/dL — ABNORMAL HIGH (ref 0.61–1.24)
GFR calc Af Amer: 58 mL/min — ABNORMAL LOW (ref >60)
GFR, EST NON AFRICAN AMERICAN: 50 mL/min — AB (ref >60)
GLUCOSE: 143 mg/dL — AB (ref 65–99)
POTASSIUM: 4.3 mmol/L (ref 3.5–5.1)
SODIUM: 135 mmol/L (ref 135–145)

## 2016-09-26 LAB — MAGNESIUM: MAGNESIUM: 2.1 mg/dL (ref 1.7–2.4)

## 2016-09-26 MED ORDER — SODIUM CHLORIDE 0.9 % IV SOLN
100.0000 mg | INTRAVENOUS | Status: DC
  Administered 2016-09-27: 100 mg via INTRAVENOUS
  Filled 2016-09-26 (×2): qty 100

## 2016-09-26 MED ORDER — SODIUM CHLORIDE 0.9 % IV SOLN
200.0000 mg | Freq: Once | INTRAVENOUS | Status: AC
  Administered 2016-09-26: 200 mg via INTRAVENOUS
  Filled 2016-09-26: qty 200

## 2016-09-26 MED ORDER — SODIUM CHLORIDE 0.9 % IV SOLN
500.0000 mg | Freq: Two times a day (BID) | INTRAVENOUS | Status: AC
  Administered 2016-09-26 (×2): 500 mg via INTRAVENOUS
  Filled 2016-09-26 (×2): qty 500

## 2016-09-26 MED ORDER — VORICONAZOLE 200 MG IV SOLR
4.0000 mg/kg | Freq: Two times a day (BID) | INTRAVENOUS | Status: DC
  Administered 2016-09-27 – 2016-09-28 (×3): 350 mg via INTRAVENOUS
  Filled 2016-09-26 (×5): qty 350

## 2016-09-26 MED ORDER — DEXAMETHASONE 4 MG PO TABS
4.0000 mg | ORAL_TABLET | Freq: Two times a day (BID) | ORAL | Status: DC
  Administered 2016-09-26 – 2016-09-28 (×4): 4 mg via ORAL
  Filled 2016-09-26 (×5): qty 1

## 2016-09-26 MED ORDER — STERILE WATER FOR INJECTION IV SOLN
INTRAVENOUS | Status: AC
  Administered 2016-09-26 – 2016-09-27 (×2): via INTRAVENOUS
  Filled 2016-09-26 (×4): qty 850

## 2016-09-26 NOTE — Progress Notes (Signed)
Peripherally Inserted Central Catheter/Midline Placement  The IV Nurse has discussed with the patient and/or persons authorized to consent for the patient, the purpose of this procedure and the potential benefits and risks involved with this procedure.  The benefits include less needle sticks, lab draws from the catheter, and the patient may be discharged home with the catheter. Risks include, but not limited to, infection, bleeding, blood clot (thrombus formation), and puncture of an artery; nerve damage and irregular heartbeat and possibility to perform a PICC exchange if needed/ordered by physician.  Alternatives to this procedure were also discussed.  Bard Power PICC patient education guide, fact sheet on infection prevention and patient information card has been provided to patient /or left at bedside.    PICC/Midline Placement Documentation        Derrick Fry 09/26/2016, 1:16 PM

## 2016-09-26 NOTE — Progress Notes (Signed)
[facility] for Infectious Disease   Reason for visit: Follow up on sinus and CNS fungal infection  Interval History: no new cultures but I spoke with Dr. Lyndon Code and is most c/w Aspergillus.  No fever.  Up with PT now.  Wife at bedside.   7 days amphotericin Day 1 voriconazole Day 1 anidulafungin   Physical Exam: Constitutional:  Vitals:   09/25/16 2052 09/26/16 0443  BP: (!) 146/68 (!) 144/73  Pulse: 65 72  Resp:  17  Temp:  98.6 F (37 C)   patient appears in NAD HENT: mm moist Respiratory: Normal respiratory effort; CTA B Cardiovascular: RRR GI: soft, nt, nd  Review of Systems: Constitutional: negative for fevers and chills Gastrointestinal: negative for nausea Integument/breast: negative for rash  Lab Results  Component Value Date   WBC 14.4 (H) 09/24/2016   HGB 10.0 (L) 09/24/2016   HCT 29.6 (L) 09/24/2016   MCV 96.7 09/24/2016   PLT 107 (L) 09/24/2016    Lab Results  Component Value Date   CREATININE 1.25 (H) 09/26/2016   BUN 37 (H) 09/26/2016   NA 135 09/26/2016   K 4.3 09/26/2016   CL 109 09/26/2016   CO2 21 (L) 09/26/2016    Lab Results  Component Value Date   ALT 22 09/24/2016   AST 29 09/24/2016   ALKPHOS 56 09/24/2016     Microbiology: Recent Results (from the past 240 hour(s))  Urine culture     Status: None   Collection Time: 09/17/16  5:34 PM  Result Value Ref Range Status   Specimen Description URINE, RANDOM  Final   Special Requests Immunocompromised  Final   Culture   Final    NO GROWTH Performed at [facility], [address], [place], [place] [address]    Report Status 09/20/2016 FINAL  Final  Blood Culture (routine x 2)     Status: None   Collection Time: 09/17/16  6:04 PM  Result Value Ref Range Status   Specimen Description BLOOD LEFT WRIST  Final   Special Requests IN PEDIATRIC BOTTLE Blood Culture adequate volume  Final   Culture   Final    NO GROWTH 5 DAYS Performed at [facility], [place] [address], [place], [place] [address]    Report Status 09/23/2016 FINAL  Final  Blood Culture (routine x 2)     Status: None   Collection Time: 09/17/16  6:05 PM  Result Value Ref Range Status   Specimen Description BLOOD RIGHT ARM  Final   Special Requests   Final    BOTTLES DRAWN AEROBIC AND ANAEROBIC Blood Culture adequate volume   Culture   Final    NO GROWTH 5 DAYS Performed at [facility], [place] [address] [place], [place] [address]    Report Status 09/23/2016 FINAL  Final  Culture, Urine     Status: None   Collection Time: 09/18/16 12:00 PM  Result Value Ref Range Status   Specimen Description URINE, RANDOM  Final   Special Requests   Final    can use urine sample in lab if still suitable for testing, otherwise collect fresh urine sample.   Culture   Final    NO GROWTH Performed at [facility], [place] [address], [place], [place] [address]    Report Status 09/19/2016 FINAL  Final  MRSA PCR Screening     Status: None   Collection Time: 09/18/16  1:30 PM  Result  Value Ref Range Status   MRSA by PCR NEGATIVE NEGATIVE Final    Comment:        The GeneXpert MRSA Assay (FDA approved for NASAL specimens only), is one component of a comprehensive MRSA colonization surveillance program. It is not intended to diagnose MRSA infection nor to guide or monitor treatment for MRSA infections.   Anaerobic culture     Status: None (Preliminary result)   Collection Time: 09/21/16  2:25 PM  Result Value Ref Range Status   Specimen Description ABSCESS  Final   Special Requests   Final    SIN CONTENTS ROCEPHIN,NISTATIN,AMPB,VANC,FLAGYL,BACTROBAN   Culture NO ANAEROBES ISOLATED  Final   Report Status PENDING  Incomplete  Fungus Culture With Stain     Status: None (Preliminary result)   Collection Time: 09/21/16  2:25 PM  Result Value Ref Range Status   Fungus Stain Final report  Final    Comment: (NOTE) Performed At: [facility] [address] [place], [place]  [id number] Lindon Romp MD [id number]    Fungus (Mycology) Culture PENDING  Incomplete   Fungal Source SINUS CONTENTS  Final  Culture, routine-sinus     Status: None   Collection Time: 09/21/16  2:25 PM  Result Value Ref Range Status   Specimen Description ABSCESS  Final   Special Requests   Final    SINUS CONTENTS PT ON ROCEPHIN,NISTATIN,AMPB,VANC,FLAGYL,BACTROBAN   Culture NO GROWTH 3 DAYS  Final   Report Status 09/24/2016 FINAL  Final  Fungus Culture Result     Status: None   Collection Time: 09/21/16  2:25 PM  Result Value Ref Range Status   Result 1 Comment  Final    Comment: (NOTE) KOH/Calcofluor preparation:  no fungus observed. Performed At: [facility] [place], [place] [id number] Lindon Romp MD YI:[id number]     Impression/Plan:  1. Aspergillus CNS/sinus infection - voriconazole is the optimal treatment for this.  Will start with a loading dose and continue with IV therapy.  After discharge, this can be converted to po therapy. There is some evidence of the benefit of combination therapy with an echinocandin though no definitive evidence.  With the severity of his infection, suppressed immune system, I will opt for combination therapy with IV anidulafungin. Duration for this will is unknown but will plan on 6 weeks and follow radiographically (with CT without contrast) and clinically Would put in a picc line Will consider stopping anidulafungin after 2-3 weeks if he is improving and do oral voriconazole monotherapy  2. Medication monitoring - will follow voriconazole levels to assure it is therapeutic and limit potential toxicity.   3. AKI - some improvement from yesterday and now stopping amphotericin.  Will continue to monitor.   Discussed with wife and Dr. Lyndon Code

## 2016-09-26 NOTE — Progress Notes (Signed)
Attempted upper right arm PICC placement x1. Accessed brachial vein without difficulty, guidewire inserted without difficulty. PICC inserted with difficulty, able to visualize PICC curling in the subclavian vein. After several attempts of insertion, procedure was aborted. PICC removed and pressure held. Tolerated well. Would recommend PICC be placed by IR. Wife states that patient has been having trouble with both shoulders.

## 2016-09-26 NOTE — Progress Notes (Signed)
PT Cancellation Note  Patient Details Name: Derrick Fry MRN: [id number] DOB: November 08, 1927   Cancelled Treatment:    Reason Eval/Treat Not Completed: Patient declined, no reason specified. Pt refusing to participate at this time. PT will continue to f/u as available.   Derrick Fry 09/26/2016, 2:54 PM

## 2016-09-26 NOTE — Progress Notes (Signed)
Pharmacy Antibiotic Note  Derrick Fry is a 83 y.o. male admitted on 09/17/2016 with CNS abscess.  Pharmacy has been consulted for voriconazole dosing.  Had been on amphotericin. 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  Plan: Give voriconazole 500mg  (~6mg /kg) IV Q12h x 2 doses, then 340mg  (4mg /kg) IV Q12h Start Eraxis 200mg  IV x 1, then Eraxis 100mg  IV Q24h Monitor clinical picture, LFTs, electrolytes, renal function, visual acuity, Vori level at Css (7/29)  F/U C&S, abx deescalation / LOT   Height: 5\' 7"  (170.2 cm) Weight: 191 lb (86.6 kg) IBW/kg (Calculated) : 66.1  Temp (24hrs), Avg:98.2 F (36.8 C), Min:97.2 F (36.2 C), Max:98.7 F (37.1 C)   Recent Labs Lab 09/20/16 0331  09/22/16 0610 09/23/16 0311 09/24/16 0533 09/25/16 0251 09/26/16 0548  WBC 14.4*  --   --   --  14.4*  --   --   CREATININE 1.11  < > 1.07 1.06 1.23 1.30* 1.25*  < > = values in this interval not displayed.  Estimated Creatinine Clearance: 43.8 mL/min (A) (by C-G formula based on SCr of 1.25 mg/dL (H)).    Allergies  Allergen Reactions  . Colchicine Other (See Comments)    Reaction:  Unknown     Antimicrobials this admission: Vancomycin 7/17>>7/20 Rocephin 7/17 >> 7/23 Flagyl 7/17 >> 7/23 Ambisome 7/17 >> 7/24 Eraxis 7/17 >> 7/17; 7/24 >> Cefepime 7/15>>7/17 Voriconazole 7/24 >>  Dose adjustments this admission: n/a  Microbiology results: 7/3 MRSA PCR: neg 7/15BCx: ntd 7/15 UCx: nf 7/16 UCx nf 7/16MRSA PCR: neg 7/19 Sinus tissue 7/19 KOH/Calcofluor - Fungus - No fungus seen  Thank you for allowing pharmacy to be a part of this patient's care.  Reginia Naas 09/26/2016 10:09 AM

## 2016-09-26 NOTE — Progress Notes (Signed)
Occupational Therapy Treatment Patient Details Name: Derrick Fry MRN: [id number] DOB: 1927-07-21 Today's Date: 09/26/2016    History of present illness Pt was admitted to [facility] for fever and AMS. Since found fungal sinus and CNS infection. Pt transferred to [facility] for L maxillary antrostomy with stripping and anterior ethmoidectomy with fusion on 7/19. PMH includes DM, AAA s/p repair, CAD, CKD, MI s/p CABG X 5, and pulmonary fibrosis.    OT comments  Pt making slow, but steady progress with OT. Patient's wife very supportive. Pt continues to require +2 assist with mobility and extensive assist with selfcare. Pt requires multimodal cues for initiation of tasks and demos slow processing of commands. OT will continue to follow acutely  Follow Up Recommendations  SNF;Supervision/Assistance - 24 hour    Equipment Recommendations       Recommendations for Other Services      Precautions / Restrictions Precautions Precautions: Fall Restrictions Weight Bearing Restrictions: No RLE Weight Bearing: Weight bearing as tolerated       Mobility Bed Mobility Overal bed mobility: Needs Assistance Bed Mobility: Sit to Supine     Supine to sit: Min assist;+2 for physical assistance;HOB elevated     General bed mobility comments: Pt required min A with VC's to scoot hips to middle of bed and to move LEs to EOB  Transfers Overall transfer level: Needs assistance Equipment used: Rolling walker (2 wheeled) Transfers: Sit to/from Stand Sit to Stand: Max assist;+2 physical assistance;From elevated surface         General transfer comment: multimodal cues for correct hand placement    Balance Overall balance assessment: Needs assistance Sitting-balance support: Bilateral upper extremity supported;Feet supported Sitting balance-Leahy Scale: Poor     Standing balance support: Bilateral upper extremity supported;During functional activity Standing balance-Leahy Scale: Poor                              ADL either performed or assessed with clinical judgement   ADL Overall ADL's : Needs assistance/impaired     Grooming: Wash/dry face;Cueing for sequencing;Sitting;Wash/dry hands;Min guard                   Toilet Transfer: Maximal assistance;BSC;+2 for safety/equipment;+2 for physical assistance;RW;Ambulation;Cueing for sequencing Toilet Transfer Details (indicate cue type and reason): multimodal cues for correct hand placement Toileting- Clothing Manipulation and Hygiene: Maximal assistance;Sit to/from stand;+2 for safety/equipment         General ADL Comments: Pt demonstrating generalized weakness and decreased initiation of steps within a task. Pt requiring significant amount of time to complete tasks.      Vision Baseline Vision/History: Wears glasses Wears Glasses: Reading only Patient Visual Report: No change from baseline                Cognition Arousal/Alertness: Awake/alert Behavior During Therapy: Flat affect Overall Cognitive Status: Impaired/Different from baseline Area of Impairment: Awareness;Following commands;Problem solving                       Following Commands: Follows one step commands inconsistently Safety/Judgement: Decreased awareness of deficits;Decreased awareness of safety   Problem Solving: Slow processing;Decreased initiation;Requires verbal cues;Requires tactile cues;Difficulty sequencing                      General Comments  pt pleasant and cooperative    Pertinent Vitals/ Pain       Pain Assessment: 0-10 Pain  Intervention(s): Monitored during session  Home Living                                          Prior Functioning/Environment              Frequency  Min 2X/week        Progress Toward Goals  OT Goals(current goals can now be found in the care plan section)  Progress towards OT goals: Progressing toward goals     Plan Discharge plan remains  appropriate    Co-evaluation                 AM-PAC PT "6 Clicks" Daily Activity     Outcome Measure   Help from another person eating meals?: None Help from another person taking care of personal grooming?: A Little Help from another person toileting, which includes using toliet, bedpan, or urinal?: Total Help from another person bathing (including washing, rinsing, drying)?: Total Help from another person to put on and taking off regular upper body clothing?: Total Help from another person to put on and taking off regular lower body clothing?: Total 6 Click Score: 11    End of Session Equipment Utilized During Treatment: Gait belt;Rolling walker;Other (comment) (BSC)  OT Visit Diagnosis: Unsteadiness on feet (R26.81);Other abnormalities of gait and mobility (R26.89);Muscle weakness (generalized) (M62.81);Other symptoms and signs involving cognitive function   Activity Tolerance Patient limited by fatigue   Patient Left in chair;with call bell/phone within reach;with nursing/sitter in room;with family/visitor present   Nurse Communication      Functional Assessment Tool Used: AM-PAC 6 Clicks Daily Activity   Time: 1308-6578 OT Time Calculation (min): 31 min  Charges: OT G-codes **NOT FOR INPATIENT CLASS** Functional Assessment Tool Used: AM-PAC 6 Clicks Daily Activity OT General Charges $OT Visit: 1 Procedure OT Treatments $Self Care/Home Management : 8-22 mins $Therapeutic Activity: 8-22 mins     Britt Bottom 09/26/2016, 12:03 PM

## 2016-09-26 NOTE — Progress Notes (Signed)
PROGRESS NOTE    Derrick Fry  JSH:[id number] DOB: 08-29-1927 DOA: 09/17/2016 PCP: Colon Branch, MD  Outpatient Specialists:    Brief Narrative:   71 ? SNF resident pulm fibrosis CABG 1997 Dr. Redmond Pulling  New ischemic CM Ef 35-40% 09/2016 AAA repair in remote past-?Mesenteric ischemia foll 03/2016 Dr. Donnetta Hutching VVs solitary kidney s/p nephrectomy 1996--base Cr 1.4-1.6  DVT + PE 01/2016 Prior SBO 03/2012-self resolved OSA Diet ctrll Dm+ neuropathy bipolar Psoriatic arthritis on MTx and low dose prednisone   Recent hosp admission ? desc thoracic dissection vs penetrating ulcer  Taken off ALL Ac at that time as risk for hemorrhage prohibitive  Re-admit 7/156 sudden fever, confusion Eventually found to have brain mass ID consutled-started broad abx Sent to [facility] for ENT consideration as below  Assessment & Plan:   Principal Problem:   Acute metabolic encephalopathy Active Problems:   DM II (diabetes mellitus, type II), controlled ([place])   OSA (obstructive sleep apnea)   CAD (coronary artery disease)   CKD (chronic kidney disease), stage III   AAA (abdominal aortic aneurysm) (HCC)   Normocytic anemia   Pulmonary fibrosis (HCC)   Chronic systolic CHF (congestive heart failure) ([place])   Brain abscess   Fungus ball   Acute encephalopathy with aphasia   Improving significantly and doing much better  will need skilled placement based on deficits--will dispo when abx on d/c are known  ?LUE DVT  L arm below elbow very swollen   Left upper extremity Doppler negative for acute DVT, no further workup Hx DVT/PE with IVC filter  Taken off xarelto due to concern for descending thoracic aortic ulcer   Sinus fungal ball - possible Aspergillus per ID- vanc d/c 7/20,  rocephin flagyl discontinued 7/23 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 XErlinda Hongd/w Dr. PAnnette Stable neurosurgery, not surgical  candidate,    IV antibiotics/  steroids for edema   CT = extensive opacification in left maxillary antrum with extension medially into the left nasal cavity with partial obstruction of the left nasal cavity.  ENT consulted--s/p left maxillary antrostomy plus anterior ethmoidectomy + fusion on 7/19   ID following- rec Anidafungin [IV] + oral voriconazole   -Decadron [id number] --> q8hrly 7/22---4 mg q12 09/26/16 and change to PO.  Keep this dose as OP for 1 week and then taper to 2 mg bid for 1 week and then reassess as OP on d/c  Pyuria -Initial concern for UTI, urine culture negative   CABG 1997 Acute on chronic systolic CHF --resolved Essential hypertension-slight hypotension 7/21--resolved History of AAA, status post repair, descending thoracic aortic ulcer  Echo EF 35-40%, given lasix, improved   Continue Lopressor iv 2.5 q6 sched-- metoprolol 50 ii/day, hydralazine 100 iii/day  resumed 7/20-holding Imdur for now  evaluated by thoracic surgery during last hospitalization from 7/1-7/9, he is deemed not a surgical candidate, he was taken off xarelto.   CKD stage 3 Solitary kidney s/p nephrectomy 1996  mild met acidosis On Amphotericin-known to be nephrotoxic -Baseline Cr 1.1-->1.3 Change to normal saline to bicarbonate in sterile water 75 cc per hour 7/23 acidosis slowly improve Co2 17--->21  Hypokalemia 2.2 7/21 Replacing with PO kdur 40 bid--60 bid 7/24 Resolved with replacement  OSA ?Pulm fibrosis  Psoriatic arth  Stop MTX  Stress dosing decadron as above  Steroid-induced hyperglycemia  Continue SSI sensitive and monitor   not on diabetic meds at  home- Lantus 25-->15 U 7/22--- discontinued Lantus completely 7/23  Cover SSI with  moderate sliding scale-trend 140s  P-E-M  Nutritionist to see  Albumin 1.8  SCD Full code Inpatient pending resulution D/w wife at bedside who understands  Consultants:   NS-Dr. Annette Stable  ENT  ID  Procedures:   None  yet  Antimicrobials:   As above    Subjective:  picc still pending They tried for an hour Eating some Drinking ensure wife at bedside Monosyllabic but laughs at joke wife makes    Objective: Vitals:   09/25/16 2052 09/26/16 0348 09/26/16 0443 09/26/16 0500  BP: (!) 146/68  (!) 144/73   Pulse: 65  72   Resp:   17   Temp:   98.6 F (37 C)   TempSrc:  Oral Oral   SpO2:   98%   Weight:    86.6 kg (191 lb)  Height:        Intake/Output Summary (Last 24 hours) at 09/26/16 1725 Last data filed at 09/26/16 0946  Gross per 24 hour  Intake              990 ml  Output             1550 ml  Net             -560 ml   Filed Weights   09/23/16 0430 09/25/16 0500 09/26/16 0500  Weight: 85.3 kg (188 lb) 86.2 kg (190 lb) 86.6 kg (191 lb)    Examination:  In bed seated Moves 4 limbs slowly but steady No ict no pallor Throat is clear abd soft No le edema  Data Reviewed: I have personally reviewed following labs and imaging studies  CBC:  Recent Labs Lab 09/20/16 0331 09/24/16 0533  WBC 14.4* 14.4*  HGB 10.5* 10.0*  HCT 30.5* 29.6*  MCV 95.0 96.7  PLT 165 638*   Basic Metabolic Panel:  Recent Labs Lab 09/21/16 1056 09/22/16 0610 09/23/16 0311 09/24/16 0533 09/25/16 0251 09/26/16 0548  NA 134* 135 136 135 136 135  K 3.5 3.5 2.8* 3.7 4.3 4.3  CL 104 106 108 110 113* 109  CO2 22 21* 22 19* 17* 21*  GLUCOSE 247* 227* 102* 87 140* 143*  BUN 28* 28* 29* 36* 38* 37*  CREATININE 1.07 1.07 1.06 1.23 1.30* 1.25*  CALCIUM 8.6* 8.5* 8.7* 8.7* 8.7* 9.0  MG 2.0 2.1 2.0  --  1.9 2.1   GFR: Estimated Creatinine Clearance: 43.8 mL/min (A) (by C-G formula based on SCr of 1.25 mg/dL (H)). Liver Function Tests:  Recent Labs Lab 09/21/16 1056 09/22/16 0610 09/23/16 0311 09/24/16 0533  AST 33 38 26 29  ALT 26 23 24 22   ALKPHOS 62 52 54 56  BILITOT 0.8 1.2 0.3 0.5  PROT 5.1* 4.5* 4.5* 4.3*  ALBUMIN 2.0* 1.7* 1.8* 1.8*   No results for input(s): LIPASE,  AMYLASE in the last 168 hours. No results for input(s): AMMONIA in the last 168 hours. Coagulation Profile: No results for input(s): INR, PROTIME in the last 168 hours. Cardiac Enzymes: No results for input(s): CKTOTAL, CKMB, CKMBINDEX, TROPONINI in the last 168 hours. BNP (last 3 results) No results for input(s): PROBNP in the last 8760 hours. HbA1C: No results for input(s): HGBA1C in the last 72 hours. CBG:  Recent Labs Lab 09/25/16 1207 09/25/16 1644 09/25/16 2047 09/26/16 0808 09/26/16 1326  GLUCAP 143* 141* 209* 152* 204*   Lipid Profile: No results for input(s): CHOL, HDL,  LDLCALC, TRIG, CHOLHDL, LDLDIRECT in the last 72 hours. Thyroid Function Tests: No results for input(s): TSH, T4TOTAL, FREET4, T3FREE, THYROIDAB in the last 72 hours. Anemia Panel: No results for input(s): VITAMINB12, FOLATE, FERRITIN, TIBC, IRON, RETICCTPCT in the last 72 hours. Urine analysis:    Component Value Date/Time   COLORURINE YELLOW 09/17/2016 1734   APPEARANCEUR HAZY (A) 09/17/2016 1734   LABSPEC 1.026 09/17/2016 1734   PHURINE 6.0 09/17/2016 1734   GLUCOSEU NEGATIVE 09/17/2016 1734   GLUCOSEU NEGATIVE 10/21/2015 0903   HGBUR NEGATIVE 09/17/2016 1734   BILIRUBINUR NEGATIVE 09/17/2016 1734   KETONESUR 20 (A) 09/17/2016 1734   PROTEINUR 30 (A) 09/17/2016 1734   UROBILINOGEN 0.2 10/21/2015 0903   NITRITE NEGATIVE 09/17/2016 1734   LEUKOCYTESUR TRACE (A) 09/17/2016 1734   Sepsis Labs: @LABRCNTIP (procalcitonin:4,lacticidven:4)  ) Recent Results (from the past 240 hour(s))  Urine culture     Status: None   Collection Time: 09/17/16  5:34 PM  Result Value Ref Range Status   Specimen Description URINE, RANDOM  Final   Special Requests Immunocompromised  Final   Culture   Final    NO GROWTH Performed at [facility], [place] [address], [place], [place] [address]    Report Status 09/20/2016 FINAL  Final  Blood Culture (routine x 2)     Status: None   Collection Time: 09/17/16   6:04 PM  Result Value Ref Range Status   Specimen Description BLOOD LEFT WRIST  Final   Special Requests IN PEDIATRIC BOTTLE Blood Culture adequate volume  Final   Culture   Final    NO GROWTH 5 DAYS Performed at [facility], [place] [address], [place], [place] [address]    Report Status 09/23/2016 FINAL  Final  Blood Culture (routine x 2)     Status: None   Collection Time: 09/17/16  6:05 PM  Result Value Ref Range Status   Specimen Description BLOOD RIGHT ARM  Final   Special Requests   Final    BOTTLES DRAWN AEROBIC AND ANAEROBIC Blood Culture adequate volume   Culture   Final    NO GROWTH 5 DAYS Performed at [facility], [place] [address], [place], [place] [address]    Report Status 09/23/2016 FINAL  Final  Culture, Urine     Status: None   Collection Time: 09/18/16 12:00 PM  Result Value Ref Range Status   Specimen Description URINE, RANDOM  Final   Special Requests   Final    can use urine sample in lab if still suitable for testing, otherwise collect fresh urine sample.   Culture   Final    NO GROWTH Performed at [facility], [place] [address], [place], [place] [address]    Report Status 09/19/2016 FINAL  Final  MRSA PCR Screening     Status: None   Collection Time: 09/18/16  1:30 PM  Result Value Ref Range Status   MRSA by PCR NEGATIVE NEGATIVE Final    Comment:        The GeneXpert MRSA Assay (FDA approved for NASAL specimens only), is one component of a comprehensive MRSA colonization surveillance program. It is not intended to diagnose MRSA infection nor to guide or monitor treatment for MRSA infections.   Anaerobic culture     Status: None   Collection Time: 09/21/16  2:25 PM  Result Value Ref Range Status   Specimen Description ABSCESS  Final   Special Requests   Final  SIN CONTENTS ROCEPHIN,NISTATIN,AMPB,VANC,FLAGYL,BACTROBAN   Culture NO ANAEROBES ISOLATED  Final   Report Status 09/26/2016 FINAL  Final  Fungus Culture With Stain      Status: None (Preliminary result)   Collection Time: 09/21/16  2:25 PM  Result Value Ref Range Status   Fungus Stain Final report  Final    Comment: (NOTE) Performed At: [facility] [address] [place], [place] [id number] Lindon Romp MD ZD:[id number]    Fungus (Mycology) Culture PENDING  Incomplete   Fungal Source SINUS CONTENTS  Final  Culture, routine-sinus     Status: None   Collection Time: 09/21/16  2:25 PM  Result Value Ref Range Status   Specimen Description ABSCESS  Final   Special Requests   Final    SINUS CONTENTS PT ON ROCEPHIN,NISTATIN,AMPB,VANC,FLAGYL,BACTROBAN   Culture NO GROWTH 3 DAYS  Final   Report Status 09/24/2016 FINAL  Final  Fungus Culture Result     Status: None   Collection Time: 09/21/16  2:25 PM  Result Value Ref Range Status   Result 1 Comment  Final    Comment: (NOTE) KOH/Calcofluor preparation:  no fungus observed. Performed At: [facility] [address] [place], [place] [id number] Lindon Romp MD AY:[id number]      Radiology Studies: No results found.   Scheduled Meds: . dexamethasone  4 mg Oral Q12H  . feeding supplement (ENSURE ENLIVE)  237 mL Oral BID BM  . hydrALAZINE  100 mg Oral Q8H  . insulin aspart  0-15 Units Subcutaneous TID WC  . insulin aspart  3 Units Subcutaneous TID WC  . metoprolol tartrate  50 mg Oral BID  . pantoprazole  40 mg Oral Daily  . potassium chloride  40 mEq Oral BID  . sodium chloride flush  3 mL Intravenous Q12H  . cyanocobalamin  100 mcg Oral Daily   Continuous Infusions: . sodium chloride    . [START ON 09/27/2016] anidulafungin    . anidulafungin 200 mg (09/26/16 1549)  .  sodium bicarbonate (isotonic) infusion in sterile water 75 mL/hr at 09/26/16 0600  . voriconazole Stopped (09/26/16 1533)   Followed by  . [START ON 09/27/2016] voriconazole       LOS: 9 days    Time spent: [place], MD [facility] ([facility]   If 7PM-7AM, please contact  night-coverage [email or web address] Password [email or web address] 09/26/2016, 5:25 PM

## 2016-09-27 ENCOUNTER — Inpatient Hospital Stay (HOSPITAL_COMMUNITY): Payer: PPO

## 2016-09-27 ENCOUNTER — Encounter (HOSPITAL_COMMUNITY): Payer: Self-pay | Admitting: Interventional Radiology

## 2016-09-27 DIAGNOSIS — I2510 Atherosclerotic heart disease of native coronary artery without angina pectoris: Secondary | ICD-10-CM

## 2016-09-27 DIAGNOSIS — N183 Chronic kidney disease, stage 3 (moderate): Secondary | ICD-10-CM

## 2016-09-27 HISTORY — PX: IR US GUIDE VASC ACCESS RIGHT: IMG2390

## 2016-09-27 HISTORY — PX: IR FLUORO GUIDE CV LINE RIGHT: IMG2283

## 2016-09-27 LAB — MISC LABCORP TEST (SEND OUT): LABCORP TEST CODE: 183512

## 2016-09-27 LAB — GLUCOSE, CAPILLARY
GLUCOSE-CAPILLARY: 130 mg/dL — AB (ref 65–99)
Glucose-Capillary: 155 mg/dL — ABNORMAL HIGH (ref 65–99)
Glucose-Capillary: 251 mg/dL — ABNORMAL HIGH (ref 65–99)
Glucose-Capillary: 259 mg/dL — ABNORMAL HIGH (ref 65–99)

## 2016-09-27 MED ORDER — POTASSIUM CHLORIDE CRYS ER 20 MEQ PO TBCR
40.0000 meq | EXTENDED_RELEASE_TABLET | Freq: Every day | ORAL | Status: DC
  Administered 2016-09-28: 40 meq via ORAL
  Filled 2016-09-27: qty 2

## 2016-09-27 MED ORDER — HEPARIN SOD (PORK) LOCK FLUSH 100 UNIT/ML IV SOLN
INTRAVENOUS | Status: AC
  Filled 2016-09-27: qty 5

## 2016-09-27 MED ORDER — LIDOCAINE HCL (PF) 1 % IJ SOLN
INTRAMUSCULAR | Status: AC
  Filled 2016-09-27: qty 30

## 2016-09-27 MED ORDER — LIDOCAINE HCL (PF) 1 % IJ SOLN
INTRAMUSCULAR | Status: DC | PRN
  Administered 2016-09-27: 5 mL

## 2016-09-27 NOTE — Consult Note (Signed)
   [facility] Inpatient Consult   09/27/2016  Derrick Fry June 15, 1927 [id number]    [facility] Care Management follow up. Went to bedside. Wife speaking with visitor/family member. Waved her hand for writer to come back.   Will follow up at later time to attempt to obtain consent for [facility] Care Management follow up while at SNF.    Marthenia Rolling, MSN-Ed, RN,BSN [facility] Liaison [phone]

## 2016-09-27 NOTE — Progress Notes (Addendum)
[facility] PROGRESS NOTE  Derrick Fry FIE:[id number] DOB: [birth year] DOA: 09/17/2016  PCP: Colon Branch, MD  Brief History/Interval Summary: 83 year old male who is resident of skilled nursing facility who has a past medical history of coronary artery disease status post CABG, pulmonary fibrosis, history of AAA, prior small bowel obstruction, presented with fever and confusion. Found to have a brain mass concerning for abscess.  Reason for Visit: Brain abscess  Consultants: ENT here in infectious disease. Neurosurgery  Procedures: s/p left maxillary antrostomy plus anterior ethmoidectomy + fusion on 7/19  Antibiotics: Anidafungin [IV] + oral voriconazole   Subjective/Interval History: Patient not very communicative. Denies any headaches. His wife is at the bedside. She reports poor appetite.  ROS: No nausea or vomiting  Objective:  Vital Signs  Vitals:   09/26/16 0500 09/26/16 2026 09/27/16 0723 09/27/16 1450  BP:  (!) 145/71 133/80 112/62  Pulse:  77  (!) 58  Resp:  19 19 20   Temp:  98.3 F (36.8 C)  98.4 F (36.9 C)  TempSrc:  Oral  Axillary  SpO2:  98% 98% 97%  Weight: 86.6 kg (191 lb)     Height:        Intake/Output Summary (Last 24 hours) at 09/27/16 1500 Last data filed at 09/27/16 0600  Gross per 24 hour  Intake           148.75 ml  Output              850 ml  Net          -701.25 ml   Filed Weights   09/23/16 0430 09/25/16 0500 09/26/16 0500  Weight: 85.3 kg (188 lb) 86.2 kg (190 lb) 86.6 kg (191 lb)    General appearance: alert, cooperative, appears stated age and no distress Resp: clear to auscultation bilaterally Cardio: regular rate and rhythm, S1, S2 normal, no murmur, click, rub or gallop GI: soft, non-tender; bowel sounds normal; no masses,  no organomegaly Extremities: extremities normal, atraumatic, no cyanosis or edema Neurologic: Awake, alert. Moving all his extremities. Poor effort is noted. No obvious focal neurological  deficits.  Lab Results:  Data Reviewed: I have personally reviewed following labs and imaging studies  CBC:  Recent Labs Lab 09/24/16 0533  WBC 14.4*  HGB 10.0*  HCT 29.6*  MCV 96.7  PLT 107*    Basic Metabolic Panel:  Recent Labs Lab 09/21/16 1056 09/22/16 0610 09/23/16 0311 09/24/16 0533 09/25/16 0251 09/26/16 0548  NA 134* 135 136 135 136 135  K 3.5 3.5 2.8* 3.7 4.3 4.3  CL 104 106 108 110 113* 109  CO2 22 21* 22 19* 17* 21*  GLUCOSE 247* 227* 102* 87 140* 143*  BUN 28* 28* 29* 36* 38* 37*  CREATININE 1.07 1.07 1.06 1.23 1.30* 1.25*  CALCIUM 8.6* 8.5* 8.7* 8.7* 8.7* 9.0  MG 2.0 2.1 2.0  --  1.9 2.1    GFR: Estimated Creatinine Clearance: 43.8 mL/min (A) (by C-G formula based on SCr of 1.25 mg/dL (H)).  Liver Function Tests:  Recent Labs Lab 09/21/16 1056 09/22/16 0610 09/23/16 0311 09/24/16 0533  AST 33 38 26 29  ALT 26 23 24 22   ALKPHOS 62 52 54 56  BILITOT 0.8 1.2 0.3 0.5  PROT 5.1* 4.5* 4.5* 4.3*  ALBUMIN 2.0* 1.7* 1.8* 1.8*    CBG:  Recent Labs Lab 09/26/16 1326 09/26/16 1753 09/26/16 2117 09/27/16 0738 09/27/16 1126  GLUCAP 204* 186* 166* 155* 130*  Recent Results (from the past 240 hour(s))  Urine culture     Status: None   Collection Time: 09/17/16  5:34 PM  Result Value Ref Range Status   Specimen Description URINE, RANDOM  Final   Special Requests Immunocompromised  Final   Culture   Final    NO GROWTH Performed at [facility], [address] [place], [place] [address]    Report Status 09/20/2016 FINAL  Final  Blood Culture (routine x 2)     Status: None   Collection Time: 09/17/16  6:04 PM  Result Value Ref Range Status   Specimen Description BLOOD LEFT WRIST  Final   Special Requests IN PEDIATRIC BOTTLE Blood Culture adequate volume  Final   Culture   Final    NO GROWTH 5 DAYS Performed at [facility], [place] [address] [place], [place] [address]    Report Status 09/23/2016 FINAL  Final  Blood  Culture (routine x 2)     Status: None   Collection Time: 09/17/16  6:05 PM  Result Value Ref Range Status   Specimen Description BLOOD RIGHT ARM  Final   Special Requests   Final    BOTTLES DRAWN AEROBIC AND ANAEROBIC Blood Culture adequate volume   Culture   Final    NO GROWTH 5 DAYS Performed at [facility], [place] [address], [place], [place] [address]    Report Status 09/23/2016 FINAL  Final  Culture, Urine     Status: None   Collection Time: 09/18/16 12:00 PM  Result Value Ref Range Status   Specimen Description URINE, RANDOM  Final   Special Requests   Final    can use urine sample in lab if still suitable for testing, otherwise collect fresh urine sample.   Culture   Final    NO GROWTH Performed at [facility], [place] [address], [place], [place] [address]    Report Status 09/19/2016 FINAL  Final  MRSA PCR Screening     Status: None   Collection Time: 09/18/16  1:30 PM  Result Value Ref Range Status   MRSA by PCR NEGATIVE NEGATIVE Final    Comment:        The GeneXpert MRSA Assay (FDA approved for NASAL specimens only), is one component of a comprehensive MRSA colonization surveillance program. It is not intended to diagnose MRSA infection nor to guide or monitor treatment for MRSA infections.   Anaerobic culture     Status: None   Collection Time: 09/21/16  2:25 PM  Result Value Ref Range Status   Specimen Description ABSCESS  Final   Special Requests   Final    SIN CONTENTS ROCEPHIN,NISTATIN,AMPB,VANC,FLAGYL,BACTROBAN   Culture NO ANAEROBES ISOLATED  Final   Report Status 09/26/2016 FINAL  Final  Fungus Culture With Stain     Status: None (Preliminary result)   Collection Time: 09/21/16  2:25 PM  Result Value Ref Range Status   Fungus Stain Final report  Final    Comment: (NOTE) Performed At: [facility] [place], [place] [id number] Lindon Romp MD SH:[id number]    Fungus (Mycology) Culture PENDING  Incomplete    Fungal Source SINUS CONTENTS  Final  Culture, routine-sinus     Status: None   Collection Time: 09/21/16  2:25 PM  Result Value Ref Range Status   Specimen Description ABSCESS  Final   Special Requests   Final    SINUS CONTENTS PT ON ROCEPHIN,NISTATIN,AMPB,VANC,FLAGYL,BACTROBAN  Culture NO GROWTH 3 DAYS  Final   Report Status 09/24/2016 FINAL  Final  Fungus Culture Result     Status: None   Collection Time: 09/21/16  2:25 PM  Result Value Ref Range Status   Result 1 Comment  Final    Comment: (NOTE) KOH/Calcofluor preparation:  no fungus observed. Performed At: [facility] [place], [place] [id number] Lindon Romp MD OZ:[id number]       Radiology Studies: Ir Fluoro Guide Cv Line Right  Result Date: 09/27/2016 CLINICAL DATA:  Brain abscess, needs durable venous access for IV antibiotic regimen EXAM: PICC PLACEMENT WITH ULTRASOUND AND FLUOROSCOPY FLUOROSCOPY TIME:  12 seconds, 1 mGy TECHNIQUE: After written informed consent was obtained, patient was placed in the supine position on angiographic table. Patency of the right basilic vein was confirmed with ultrasound with image documentation. An appropriate skin site was determined. Skin site was marked. Region was prepped using maximum barrier technique including cap and mask, sterile gown, sterile gloves, large sterile sheet, and Chlorhexidine as cutaneous antisepsis. The region was infiltrated locally with 1% lidocaine. Under real-time ultrasound guidance, the right basilic vein was accessed with a 21 gauge micropuncture needle; the needle tip within the vein was confirmed with ultrasound image documentation. Needle exchanged over a 018 guidewire for a peel-away sheath, through which a 5-French double-lumen power injectable PICC trimmed to 36cm was advanced, positioned with its tip near the cavoatrial junction. Spot chest radiograph confirms appropriate catheter position. Catheter was flushed per protocol and  secured externally. The patient tolerated procedure well. COMPLICATIONS: COMPLICATIONS none IMPRESSION: 1. Technically successful five [place] double lumen power injectable PICC placement Electronically Signed   By: Lucrezia Europe M.D.   On: 09/27/2016 09:38   Ir US Guide Vasc Access Right  Result Date: 09/27/2016 CLINICAL DATA:  Brain abscess, needs durable venous access for IV antibiotic regimen EXAM: PICC PLACEMENT WITH ULTRASOUND AND FLUOROSCOPY FLUOROSCOPY TIME:  12 seconds, 1 mGy TECHNIQUE: After written informed consent was obtained, patient was placed in the supine position on angiographic table. Patency of the right basilic vein was confirmed with ultrasound with image documentation. An appropriate skin site was determined. Skin site was marked. Region was prepped using maximum barrier technique including cap and mask, sterile gown, sterile gloves, large sterile sheet, and Chlorhexidine as cutaneous antisepsis. The region was infiltrated locally with 1% lidocaine. Under real-time ultrasound guidance, the right basilic vein was accessed with a 21 gauge micropuncture needle; the needle tip within the vein was confirmed with ultrasound image documentation. Needle exchanged over a 018 guidewire for a peel-away sheath, through which a 5-French double-lumen power injectable PICC trimmed to 36cm was advanced, positioned with its tip near the cavoatrial junction. Spot chest radiograph confirms appropriate catheter position. Catheter was flushed per protocol and secured externally. The patient tolerated procedure well. COMPLICATIONS: COMPLICATIONS none IMPRESSION: 1. Technically successful five [place] double lumen power injectable PICC placement Electronically Signed   By: Lucrezia Europe M.D.   On: 09/27/2016 09:38     Medications:  Scheduled: . dexamethasone  4 mg Oral Q12H  . feeding supplement (ENSURE ENLIVE)  237 mL Oral BID BM  . heparin lock flush      . hydrALAZINE  100 mg Oral Q8H  . insulin aspart   0-15 Units Subcutaneous TID WC  . insulin aspart  3 Units Subcutaneous TID WC  . lidocaine (PF)      . lidocaine (PF)      . metoprolol tartrate  50 mg Oral BID  . pantoprazole  40 mg Oral Daily  . potassium chloride  40 mEq Oral BID  . sodium chloride flush  3 mL Intravenous Q12H  . cyanocobalamin  100 mcg Oral Daily   Continuous: . sodium chloride    . anidulafungin    .  sodium bicarbonate (isotonic) infusion in sterile water 75 mL/hr at 09/27/16 1226  . voriconazole Stopped (09/27/16 1330)   EBR:AXENMM chloride, ipratropium-albuterol, lidocaine (PF), lidocaine, lidocaine, lidocaine-EPINEPHrine, [DISCONTINUED] ondansetron **OR** ondansetron (ZOFRAN) IV, oxymetazoline, silver nitrate applicators, sodium chloride flush, TRIPLE ANTIBIOTIC  Assessment/Plan:  Principal Problem:   Acute metabolic encephalopathy Active Problems:   DM II (diabetes mellitus, type II), controlled (HCC)   OSA (obstructive sleep apnea)   CAD (coronary artery disease)   CKD (chronic kidney disease), stage III   AAA (abdominal aortic aneurysm) (HCC)   Normocytic anemia   Pulmonary fibrosis (HCC)   Chronic systolic CHF (congestive heart failure) ([place])   Brain abscess   Fungus ball    Brain abscess/fungal ball Case was discussed with neurosurgery initially, but patient not a candidate for surgical intervention. MRI brain as well as CT scan showed sinusitis. ENT was consulted. Patient underwent left maxillary antrostomy plus anterior ethmoidectomy. Cultures from this procedure positive for Aspergillus. Infectious disease was consulted. Patient is on 2 antifungals at this time. Patient was also given dexamethasone and is on a slow taper currently. Plan is to give treatment for 6 weeks. He is on IV anidulafingin and on IV voriconazole. The voriconazole can be converted to oral at the time of discharge. PICC line was placed by interventional radiology this morning.  Acute toxic encephalopathy Secondary to  infection. Mental status has been very slow to improve. Discussed with family. Have told him that his recovery will be very slow and long.  History of coronary artery disease status post CABG Stable  Acute and chronic systolic CHF. Ejection fraction 35-40%. He was given Lasix with improvement. Currently stable.  History of essential hypertension. Stable. Continue to monitor blood pressures closely.  History of abdominal aortic aneurysm status post repair and history of descending thoracic aortic ulcer. Not a candidate for thoracic surgery. He was taken off of his anticoagulation due to high risk of bleeding.  Chronic kidney disease stage III/non-anion gap metabolic acidosis He has solitary kidney as he is status post nephrectomy in 1996. Renal function appears to be stable. He was given bicarbonate with improvement.  Hypokalemia Improved with repletion.  History of psoriatic arthritis Was on methotrexate. This has been stopped for now.  Protein calorie malnutrition Patient encouraged to eat. Continue ensure.  DVT Prophylaxis: SCDs    Code Status: DNR  Family Communication: Discussed with the patient's family  Disposition Plan: Will likely go to SNF in the next 2448 hrs.    LOS: 10 days   [facility] Pager [phone] 09/27/2016, 3:00 PM  If 7PM-7AM, please contact night-coverage at [email or web address], password [facility]

## 2016-09-27 NOTE — Clinical Social Work Note (Signed)
CSW spoke with MD regarding d/c. Pt will not d/c today. [facility] notified and unable to hold bed however will hold onto his referral. CSW will follow up with [facility] when d/c is determined. Insurance updated.   [place], [place]

## 2016-09-27 NOTE — Clinical Social Work Note (Signed)
Pt has a bed at [facility] today. Insurance Josem Kaufmann has been obtained--information given to facility (7/24). CSW spoke to pt's spouse 7/24 to confirm facility choice. D/C pending PICC placement--facility aware.   [place], [place]

## 2016-09-27 NOTE — Procedures (Signed)
  Procedure:   PICC placement   Preprocedure diagnosis:  Brain abscess  Postprocedure diagnosis:  same EBL:     minimal Complications:   none immediate  See full dictation in [facility].  Dillard Cannon MD Main # [phone] Pager  [phone]

## 2016-09-28 DIAGNOSIS — Z87891 Personal history of nicotine dependence: Secondary | ICD-10-CM | POA: Diagnosis not present

## 2016-09-28 DIAGNOSIS — Z7952 Long term (current) use of systemic steroids: Secondary | ICD-10-CM | POA: Diagnosis not present

## 2016-09-28 DIAGNOSIS — Z9049 Acquired absence of other specified parts of digestive tract: Secondary | ICD-10-CM | POA: Diagnosis not present

## 2016-09-28 DIAGNOSIS — G4733 Obstructive sleep apnea (adult) (pediatric): Secondary | ICD-10-CM | POA: Diagnosis present

## 2016-09-28 DIAGNOSIS — G936 Cerebral edema: Secondary | ICD-10-CM | POA: Diagnosis not present

## 2016-09-28 DIAGNOSIS — Z833 Family history of diabetes mellitus: Secondary | ICD-10-CM | POA: Diagnosis not present

## 2016-09-28 DIAGNOSIS — M6281 Muscle weakness (generalized): Secondary | ICD-10-CM | POA: Diagnosis not present

## 2016-09-28 DIAGNOSIS — B49 Unspecified mycosis: Secondary | ICD-10-CM | POA: Diagnosis not present

## 2016-09-28 DIAGNOSIS — I130 Hypertensive heart and chronic kidney disease with heart failure and stage 1 through stage 4 chronic kidney disease, or unspecified chronic kidney disease: Secondary | ICD-10-CM | POA: Diagnosis not present

## 2016-09-28 DIAGNOSIS — R4182 Altered mental status, unspecified: Secondary | ICD-10-CM | POA: Diagnosis not present

## 2016-09-28 DIAGNOSIS — I5023 Acute on chronic systolic (congestive) heart failure: Secondary | ICD-10-CM | POA: Diagnosis not present

## 2016-09-28 DIAGNOSIS — I5022 Chronic systolic (congestive) heart failure: Secondary | ICD-10-CM | POA: Diagnosis not present

## 2016-09-28 DIAGNOSIS — I472 Ventricular tachycardia: Secondary | ICD-10-CM | POA: Diagnosis not present

## 2016-09-28 DIAGNOSIS — N183 Chronic kidney disease, stage 3 (moderate): Secondary | ICD-10-CM | POA: Diagnosis not present

## 2016-09-28 DIAGNOSIS — Z66 Do not resuscitate: Secondary | ICD-10-CM | POA: Diagnosis not present

## 2016-09-28 DIAGNOSIS — Z794 Long term (current) use of insulin: Secondary | ICD-10-CM | POA: Diagnosis not present

## 2016-09-28 DIAGNOSIS — I7100 Dissection of unspecified site of aorta: Secondary | ICD-10-CM | POA: Diagnosis not present

## 2016-09-28 DIAGNOSIS — N179 Acute kidney failure, unspecified: Secondary | ICD-10-CM | POA: Diagnosis not present

## 2016-09-28 DIAGNOSIS — I110 Hypertensive heart disease with heart failure: Secondary | ICD-10-CM | POA: Diagnosis not present

## 2016-09-28 DIAGNOSIS — J8410 Pulmonary fibrosis, unspecified: Secondary | ICD-10-CM | POA: Diagnosis not present

## 2016-09-28 DIAGNOSIS — E8809 Other disorders of plasma-protein metabolism, not elsewhere classified: Secondary | ICD-10-CM | POA: Diagnosis not present

## 2016-09-28 DIAGNOSIS — I2510 Atherosclerotic heart disease of native coronary artery without angina pectoris: Secondary | ICD-10-CM | POA: Diagnosis not present

## 2016-09-28 DIAGNOSIS — R402431 Glasgow coma scale score 3-8, in the field [EMT or ambulance]: Secondary | ICD-10-CM | POA: Diagnosis not present

## 2016-09-28 DIAGNOSIS — G060 Intracranial abscess and granuloma: Secondary | ICD-10-CM | POA: Diagnosis not present

## 2016-09-28 DIAGNOSIS — Z888 Allergy status to other drugs, medicaments and biological substances status: Secondary | ICD-10-CM | POA: Diagnosis not present

## 2016-09-28 DIAGNOSIS — B449 Aspergillosis, unspecified: Secondary | ICD-10-CM | POA: Diagnosis not present

## 2016-09-28 DIAGNOSIS — E1122 Type 2 diabetes mellitus with diabetic chronic kidney disease: Secondary | ICD-10-CM | POA: Diagnosis not present

## 2016-09-28 DIAGNOSIS — I4891 Unspecified atrial fibrillation: Secondary | ICD-10-CM | POA: Diagnosis present

## 2016-09-28 DIAGNOSIS — M109 Gout, unspecified: Secondary | ICD-10-CM | POA: Diagnosis present

## 2016-09-28 DIAGNOSIS — B999 Unspecified infectious disease: Secondary | ICD-10-CM | POA: Diagnosis not present

## 2016-09-28 DIAGNOSIS — B488 Other specified mycoses: Secondary | ICD-10-CM | POA: Diagnosis not present

## 2016-09-28 DIAGNOSIS — Z905 Acquired absence of kidney: Secondary | ICD-10-CM | POA: Diagnosis not present

## 2016-09-28 DIAGNOSIS — J329 Chronic sinusitis, unspecified: Secondary | ICD-10-CM | POA: Diagnosis not present

## 2016-09-28 DIAGNOSIS — B4489 Other forms of aspergillosis: Secondary | ICD-10-CM | POA: Diagnosis not present

## 2016-09-28 DIAGNOSIS — Z515 Encounter for palliative care: Secondary | ICD-10-CM | POA: Diagnosis not present

## 2016-09-28 DIAGNOSIS — Z96652 Presence of left artificial knee joint: Secondary | ICD-10-CM | POA: Diagnosis not present

## 2016-09-28 DIAGNOSIS — R6520 Severe sepsis without septic shock: Secondary | ICD-10-CM | POA: Diagnosis not present

## 2016-09-28 DIAGNOSIS — M549 Dorsalgia, unspecified: Secondary | ICD-10-CM | POA: Diagnosis not present

## 2016-09-28 DIAGNOSIS — A419 Sepsis, unspecified organism: Secondary | ICD-10-CM | POA: Diagnosis not present

## 2016-09-28 DIAGNOSIS — E785 Hyperlipidemia, unspecified: Secondary | ICD-10-CM | POA: Diagnosis not present

## 2016-09-28 DIAGNOSIS — G9341 Metabolic encephalopathy: Secondary | ICD-10-CM | POA: Diagnosis not present

## 2016-09-28 DIAGNOSIS — Q600 Renal agenesis, unilateral: Secondary | ICD-10-CM | POA: Diagnosis not present

## 2016-09-28 DIAGNOSIS — R1312 Dysphagia, oropharyngeal phase: Secondary | ICD-10-CM | POA: Diagnosis not present

## 2016-09-28 DIAGNOSIS — I7102 Dissection of abdominal aorta: Secondary | ICD-10-CM | POA: Diagnosis not present

## 2016-09-28 DIAGNOSIS — D696 Thrombocytopenia, unspecified: Secondary | ICD-10-CM | POA: Diagnosis not present

## 2016-09-28 LAB — CBC
HEMATOCRIT: 24.3 % — AB (ref 39.0–52.0)
Hemoglobin: 8.7 g/dL — ABNORMAL LOW (ref 13.0–17.0)
MCH: 33.6 pg (ref 26.0–34.0)
MCHC: 35.8 g/dL (ref 30.0–36.0)
MCV: 93.8 fL (ref 78.0–100.0)
Platelets: 82 10*3/uL — ABNORMAL LOW (ref 150–400)
RBC: 2.59 MIL/uL — ABNORMAL LOW (ref 4.22–5.81)
RDW: 18.2 % — AB (ref 11.5–15.5)
WBC: 13.9 10*3/uL — ABNORMAL HIGH (ref 4.0–10.5)

## 2016-09-28 LAB — POTASSIUM: POTASSIUM: 4.8 mmol/L (ref 3.5–5.1)

## 2016-09-28 LAB — GLUCOSE, CAPILLARY
GLUCOSE-CAPILLARY: 249 mg/dL — AB (ref 65–99)
Glucose-Capillary: 204 mg/dL — ABNORMAL HIGH (ref 65–99)

## 2016-09-28 LAB — BASIC METABOLIC PANEL
ANION GAP: 9 (ref 5–15)
BUN: 46 mg/dL — ABNORMAL HIGH (ref 6–20)
CALCIUM: 8.3 mg/dL — AB (ref 8.9–10.3)
CO2: 23 mmol/L (ref 22–32)
CREATININE: 1.34 mg/dL — AB (ref 0.61–1.24)
Chloride: 108 mmol/L (ref 101–111)
GFR calc Af Amer: 53 mL/min — ABNORMAL LOW (ref >60)
GFR calc non Af Amer: 46 mL/min — ABNORMAL LOW (ref >60)
GLUCOSE: 202 mg/dL — AB (ref 65–99)
Potassium: 5.3 mmol/L — ABNORMAL HIGH (ref 3.5–5.1)
Sodium: 140 mmol/L (ref 135–145)

## 2016-09-28 MED ORDER — OXYMETAZOLINE HCL 0.05 % NA SOLN: 1.0000 | Freq: Once | NASAL | 0 refills | Status: DC | PRN

## 2016-09-28 MED ORDER — INSULIN ASPART 100 UNIT/ML ~~LOC~~ SOLN: SUBCUTANEOUS | 11 refills | Status: DC

## 2016-09-28 MED ORDER — GLUCERNA SHAKE PO LIQD: 237.0000 mL | Freq: Three times a day (TID) | ORAL | 0 refills | Status: DC

## 2016-09-28 MED ORDER — DEXAMETHASONE 4 MG PO TABS: ORAL_TABLET | ORAL | Status: DC

## 2016-09-28 MED ORDER — VORICONAZOLE 50 MG PO TABS: 350.0000 mg | ORAL_TABLET | Freq: Two times a day (BID) | ORAL | 0 refills | Status: DC

## 2016-09-28 MED ORDER — ENSURE ENLIVE PO LIQD: 237.0000 mL | Freq: Three times a day (TID) | ORAL | 12 refills | Status: DC

## 2016-09-28 MED ORDER — FUROSEMIDE 20 MG PO TABS: 20.0000 mg | ORAL_TABLET | ORAL | Status: DC

## 2016-09-28 MED ORDER — ANIDULAFUNGIN IV (FOR PTA / DISCHARGE USE ONLY): 100.0000 mg | INTRAVENOUS | 0 refills | Status: DC

## 2016-09-28 MED ORDER — SODIUM CHLORIDE 0.9% FLUSH
10.0000 mL | INTRAVENOUS | Status: DC | PRN
  Administered 2016-09-28: 10 mL
  Filled 2016-09-28: qty 40

## 2016-09-28 MED ORDER — INSULIN ASPART 100 UNIT/ML ~~LOC~~ SOLN: 3.0000 [IU] | Freq: Three times a day (TID) | SUBCUTANEOUS | 11 refills | Status: DC

## 2016-09-28 MED ORDER — SODIUM CHLORIDE 0.9 % IV SOLN: 100.0000 mg | INTRAVENOUS | Status: DC

## 2016-09-28 MED ORDER — GLUCERNA SHAKE PO LIQD
237.0000 mL | Freq: Three times a day (TID) | ORAL | Status: DC
  Administered 2016-09-28: 237 mL via ORAL

## 2016-09-28 MED ORDER — FUROSEMIDE 10 MG/ML IJ SOLN
20.0000 mg | Freq: Once | INTRAMUSCULAR | Status: AC
  Administered 2016-09-28: 20 mg via INTRAVENOUS
  Filled 2016-09-28: qty 2

## 2016-09-28 NOTE — Progress Notes (Signed)
PHARMACY CONSULT NOTE FOR:  OUTPATIENT  PARENTERAL ANTIBIOTIC THERAPY (OPAT)  Indication: CNS fungal infection Regimen: Voriconazole 4mg /kg oral every 12 hours + Eraxis 100mg  IV daily End date: 12/27/16 (3 months per discussion with Dr. Linus Salmons  Per discussion with Dr. Linus Salmons - ok to change to oral Voriconazole at discharge. Will need a voriconazole level after 4 to 5 days of oral therapy.   IV antibiotic discharge orders are pended. To discharging provider:  please sign these orders via discharge navigator,  Select New Orders & click on the button choice - Manage This Unsigned Work.     Thank you for allowing pharmacy to be a part of this patient's care.  Sloan Leiter, PharmD, BCPS Clinical Pharmacist Clinical Phone 09/28/2016 until 3:30PM [phone] After hours, please call [phone] 09/28/2016, 9:57 AM

## 2016-09-28 NOTE — Progress Notes (Signed)
Physical Therapy Treatment Patient Details Name: Derrick Fry MRN: [id number] DOB: [birth year] Today's Date: 09/28/2016    History of Present Illness Pt was admitted to [facility] for fever and AMS. Since found fungal sinus and CNS infection. Pt transferred to [facility] for L maxillary antrostomy with stripping and anterior ethmoidectomy with fusion on 7/19. PMH includes DM, AAA s/p repair, CAD, CKD, MI s/p CABG X 5, and pulmonary fibrosis.     PT Comments    Pt slow to respond when questioned, gave DOB when asked pt's name. At this time pt not progressing towards goals as he is limited by fatigue. He is expected to d/c to SNF this PM. Patient would benefit from continued skilled PT. Will continue to follow acutely if still here.   Follow Up Recommendations  SNF     Equipment Recommendations  None recommended by PT    Recommendations for Other Services OT consult     Precautions / Restrictions Precautions Precautions: Fall Restrictions Weight Bearing Restrictions: No RLE Weight Bearing: Weight bearing as tolerated    Mobility  Bed Mobility Overal bed mobility: Needs Assistance Bed Mobility: Sit to Supine;Rolling Rolling: Max assist     Sit to supine: Max assist;+2 for physical assistance;HOB elevated   General bed mobility comments: Pt required Max A to return to bed after treatment as pt fatigues quickly. Assistance provided to elevate LEs onto bed, control descent of trunk., and scoot up in bed. Max A to roll pt for nurse to apply bandage to sacral area.  Transfers Overall transfer level: Needs assistance Equipment used: Rolling walker (2 wheeled) Transfers: Sit to/from Stand Sit to Stand: +2 physical assistance;Max assist;Mod assist         General transfer comment: Mod A +2  with RW for first transfer from recliner to [facility]. Pt required Max A +2 with steady for second transfer as pt fatigues quickly. Multimodal cues for hand placement during transfer.  Ambulation/Gait             General Gait Details: Not attempted   Stairs            Wheelchair Mobility    Modified Rankin (Stroke Patients Only)       Balance Overall balance assessment: Needs assistance Sitting-balance support: Bilateral upper extremity supported;Feet supported Sitting balance-Leahy Scale: Poor Sitting balance - Comments: Able to perform bilateral coordination and maintain sitting balance during groomign at EOB   Standing balance support: Bilateral upper extremity supported;During functional activity Standing balance-Leahy Scale: Poor Standing balance comment: Reliant on UE support                            Cognition Arousal/Alertness: Awake/alert Behavior During Therapy: Flat affect Overall Cognitive Status: Impaired/Different from baseline Area of Impairment: Awareness;Following commands;Problem solving                       Following Commands: Follows one step commands inconsistently Safety/Judgement: Decreased awareness of deficits;Decreased awareness of safety Awareness: Anticipatory Problem Solving: Slow processing;Decreased initiation;Requires verbal cues;Requires tactile cues;Difficulty sequencing General Comments: Pt not very verbal during Tx, Difficulty following commands       Exercises      General Comments General comments (skin integrity, edema, etc.): Pt with several small wounds on arms. Mobility tech present applied bandages. Redness noted around sacral area and pt c/o pain on backside. RN notified and assisted RN rolling pt to apply bandage to sacral region.  Pertinent Vitals/Pain Pain Assessment: Faces Faces Pain Scale: Hurts even more Pain Descriptors / Indicators: Aching;Sore Pain Intervention(s): Monitored during session;Limited activity within patient's tolerance;Repositioned    Home Living                      Prior Function            PT Goals (current goals can now be found in the care plan  section) Acute Rehab PT Goals Patient Stated Goal: to get better PT Goal Formulation: With patient Time For Goal Achievement: 10/06/16 Potential to Achieve Goals: Good Progress towards PT goals: Not progressing toward goals - comment (limited by fatigue)    Frequency    Min 2X/week      PT Plan Current plan remains appropriate    Co-evaluation              AM-PAC PT "6 Clicks" Daily Activity  Outcome Measure  Difficulty turning over in bed (including adjusting bedclothes, sheets and blankets)?: Total Difficulty moving from lying on back to sitting on the side of the bed? : Total Difficulty sitting down on and standing up from a chair with arms (e.g., wheelchair, bedside commode, etc,.)?: Total Help needed moving to and from a bed to chair (including a wheelchair)?: Total Help needed walking in hospital room?: Total Help needed climbing 3-5 steps with a railing? : Total 6 Click Score: 6    End of Session Equipment Utilized During Treatment: Gait belt Activity Tolerance: Patient limited by fatigue Patient left: in bed;with call bell/phone within reach;with family/visitor present Nurse Communication: Mobility status PT Visit Diagnosis: Muscle weakness (generalized) (M62.81);Unsteadiness on feet (R26.81);Difficulty in walking, not elsewhere classified (R26.2);Pain     Time: 2633-3545 PT Time Calculation (min) (ACUTE ONLY): 29 min  Charges:  $Therapeutic Activity: 8-22 mins                    G Codes:      Benjiman Core, [place] Pager [phone] Acute Rehab    Allena Katz 09/28/2016, 3:58 PM

## 2016-09-28 NOTE — Clinical Social Work Note (Signed)
[facility] does have a bed for pt today. MD confirmed pt will d/c today. CSW will set up transport pending d/c summary.  [place], [place]

## 2016-09-28 NOTE — Progress Notes (Addendum)
[facility]   Reason for visit: Follow up on sinus and CNS fungal infection  Interval History: no new culture growth, path c/w aspergillus,  Day 3 voriconazole Day 3 anidulafungin   Physical Exam: Constitutional:  Vitals:   09/28/16 0348 09/28/16 0850  BP: 117/67 (!) 136/58  Pulse: 71 78  Resp: 19   Temp: 98.4 F (36.9 C)    patient appears in NAD HENT: mm moist Respiratory: Normal respiratory effort; CTA B Cardiovascular: RRR GI: soft, nt, nd  Review of Systems: Constitutional: negative for fevers and chills Gastrointestinal: negative for nausea Integument/breast: negative for rash  Lab Results  Component Value Date   WBC 13.9 (H) 09/28/2016   HGB 8.7 (L) 09/28/2016   HCT 24.3 (L) 09/28/2016   MCV 93.8 09/28/2016   PLT 82 (L) 09/28/2016    Lab Results  Component Value Date   CREATININE 1.34 (H) 09/28/2016   BUN 46 (H) 09/28/2016   NA 140 09/28/2016   K 5.3 (H) 09/28/2016   CL 108 09/28/2016   CO2 23 09/28/2016    Lab Results  Component Value Date   ALT 22 09/24/2016   AST 29 09/24/2016   ALKPHOS 56 09/24/2016     Microbiology: Recent Results (from the past 240 hour(s))  Culture, Urine     Status: None   Collection Time: 09/18/16 12:00 PM  Result Value Ref Range Status   Specimen Description URINE, RANDOM  Final   Special Requests   Final    can use urine sample in lab if still suitable for testing, otherwise collect fresh urine sample.   Culture   Final    NO GROWTH Performed at [facility], [place] [address] [place], [place] [address]    Report Status 09/19/2016 FINAL  Final  MRSA PCR Screening     Status: None   Collection Time: 09/18/16  1:30 PM  Result Value Ref Range Status   MRSA by PCR NEGATIVE NEGATIVE Final    Comment:        The GeneXpert MRSA Assay (FDA approved for NASAL specimens only), is one component of a comprehensive MRSA colonization surveillance program. It is not intended to diagnose  MRSA infection nor to guide or monitor treatment for MRSA infections.   Anaerobic culture     Status: None   Collection Time: 09/21/16  2:25 PM  Result Value Ref Range Status   Specimen Description ABSCESS  Final   Special Requests   Final    SIN CONTENTS ROCEPHIN,NISTATIN,AMPB,VANC,FLAGYL,BACTROBAN   Culture NO ANAEROBES ISOLATED  Final   Report Status 09/26/2016 FINAL  Final  Fungus Culture With Stain     Status: None (Preliminary result)   Collection Time: 09/21/16  2:25 PM  Result Value Ref Range Status   Fungus Stain Final report  Final    Comment: (NOTE) Performed At: [facility] [address] [place], [place] [id number] Lindon Romp MD IH:[id number]    Fungus (Mycology) Culture PENDING  Incomplete   Fungal Source SINUS CONTENTS  Final  Culture, routine-sinus     Status: None   Collection Time: 09/21/16  2:25 PM  Result Value Ref Range Status   Specimen Description ABSCESS  Final   Special Requests   Final    SINUS CONTENTS PT ON ROCEPHIN,NISTATIN,AMPB,VANC,FLAGYL,BACTROBAN   Culture NO GROWTH 3 DAYS  Final   Report Status 09/24/2016 FINAL  Final  Fungus Culture Result     Status: None  Collection Time: 09/21/16  2:25 PM  Result Value Ref Range Status   Result 1 Comment  Final    Comment: (NOTE) KOH/Calcofluor preparation:  no fungus observed. Performed At: [facility] [place], [place] [id number] Lindon Romp MD FV:[id number]     Impression/Plan:  1. Aspergillus CNS/sinus infection - on dual therapy with voriconazole and anidulafungin.  I recommend continuing with both via IV and after about 4 weeks, I will consider converting the voriconazole to po therapy. Voriconazole at current dose and should get a trough level after 5 days anidulafungin at current dose I will do follow up imaging in about 4 weeks He will need prolonged therapy 2-3 months, depending on progress  2. Medication monitoring - voriconazole level after  5 days  3. AKI - stable creat, I suspect some dehydration and getting fluids.    I will arrange follow up in about 3-4 weeks.    ADDENDUM: I spoke with pharmacy and will use voriconazole po and will have instructions on following the levels closely.   I will sign off, thanks

## 2016-09-28 NOTE — Discharge Summary (Signed)
[facility]  Physician Discharge Summary   Patient ID: Derrick Fry MRN: [id number] DOB/AGE: 12/01/1927 83 y.o.  Admit date: 09/17/2016 Discharge date: 09/28/2016  PCP: Colon Branch, MD  DISCHARGE DIAGNOSES:  Principal Problem:   Acute metabolic encephalopathy Active Problems:   DM II (diabetes mellitus, type II), controlled ([place])   OSA (obstructive sleep apnea)   CAD (coronary artery disease)   CKD (chronic kidney disease), stage III   AAA (abdominal aortic aneurysm) (HCC)   Normocytic anemia   Pulmonary fibrosis (HCC)   Chronic systolic CHF (congestive heart failure) (HCC)   Brain abscess   Fungus ball   RECOMMENDATIONS FOR OUTPATIENT FOLLOW UP: 1. Please check CBC and basic metabolic panel on Saturday and then as per instructions provided under antifungal treatment. 2. Need CBG monitoring. See below.  DISCHARGE CONDITION: fair  Diet recommendation: Modified carbohydrate  Filed Weights   09/23/16 0430 09/25/16 0500 09/26/16 0500  Weight: 85.3 kg (188 lb) 86.2 kg (190 lb) 86.6 kg (191 lb)    INITIAL HISTORY: 83 year old male who is resident of skilled nursing facility who has a past medical history of coronary artery disease status post CABG, pulmonary fibrosis, history of AAA, prior small bowel obstruction, presented with fever and confusion. Found to have a brain mass concerning for abscess.  Consultations: ENT Infectious disease.  Neurosurgery  Procedures: s/p left maxillary antrostomy plus anterior ethmoidectomy + fusion on 7/19   HOSPITAL COURSE:   Brain abscess/fungal ball This was seen on brain imaging studies. Case was discussed with neurosurgery initially, but patient not a candidate for surgical intervention. MRI brain as well as CT scan also showed sinusitis. ENT was consulted. Patient underwent left maxillary antrostomy plus anterior ethmoidectomy. Cultures from this procedure positive for Aspergillus. Infectious disease was consulted.  Patient is on 2 antifungals at this time. Patient was also given dexamethasone and is on a slow taper currently. Plan is to give treatment for 3 months. Discussed with Dr. Linus Salmons. Infectious disease will follow in the clinic and will repeat imaging studies as outpatient. He will be discharged on IV anidulafingin and on oral voriconazole. PICC line was placed by interventional radiology.  Acute toxic encephalopathy Secondary to infection. Mental status has been very slow to improve. Discussed with family. Have told him that his recovery will be very slow.  History of coronary artery disease status post CABG Stable  Acute on chronic systolic CHF. Ejection fraction 35-40%. He was given Lasix with improvement. Currently stable. He is on a beta blocker. Not a candidate for ACE inhibitor or ARB due to renal insufficiency. Will be given an additional dose of intravenous Lasix today. Then will be started on Lasix every other day with close monitoring of renal function and urine output.  History of essential hypertension. Stable. Continue to monitor.  History of abdominal aortic aneurysm status post repair and history of descending thoracic aortic ulcer. Not a candidate for thoracic surgery. He was taken off of his anticoagulation due to high risk of bleeding.  Chronic kidney disease stage III/non-anion gap metabolic acidosis He has solitary kidney as he is status post nephrectomy in 1996. Renal function appears to be stable. He was given bicarbonate with improvement. Monitor labs periodically at the skilled nursing facility. Potassium noted to be high this morning. He was getting scheduled potassium since he was on amphotericin. This has been discontinued. We will recheck a level later today.  Hypokalemia Improved with repletion.  History of psoriatic arthritis Was on methotrexate. This has  been stopped for now.  Protein calorie malnutrition Patient encouraged to eat. Continue  glucerna.  Hyperglycemia Likely due to combination of steroids and ensure. He is on SSI. Changing from Ensure to [place] will help. Continue monitoring CBG's. If they remain persistently greater than 180, consider long acting insulin.  Normocytic anemia Drop in hemoglobin as noted. No overt bleeding. This is likely dilutional.  Thrombocytopenia. Low platelet counts noted. No overt bleeding. Will need monitoring as outpatient.  Chronic steroid use It appears that patient was on prednisone daily prior to admission. Presumably for gout. As noted above he has been switched over to dexamethasone due to his brain abscess. This is being slowly tapered. He will eventually need to get back on his usual prednisone once he is off of dexamethasone.  Patient is DO NOT RESUSCITATE.  Overall, stable. Potassium level to be repeated later today. Should be okay for discharge to skilled nursing facility.    PERTINENT LABS:  The results of significant diagnostics from this hospitalization (including imaging, microbiology, ancillary and laboratory) are listed below for reference.    Microbiology: Recent Results (from the past 240 hour(s))  MRSA PCR Screening     Status: None   Collection Time: 09/18/16  1:30 PM  Result Value Ref Range Status   MRSA by PCR NEGATIVE NEGATIVE Final    Comment:        The GeneXpert MRSA Assay (FDA approved for NASAL specimens only), is one component of a comprehensive MRSA colonization surveillance program. It is not intended to diagnose MRSA infection nor to guide or monitor treatment for MRSA infections.   Anaerobic culture     Status: None   Collection Time: 09/21/16  2:25 PM  Result Value Ref Range Status   Specimen Description ABSCESS  Final   Special Requests   Final    SIN CONTENTS ROCEPHIN,NISTATIN,AMPB,VANC,FLAGYL,BACTROBAN   Culture NO ANAEROBES ISOLATED  Final   Report Status 09/26/2016 FINAL  Final  Fungus Culture With Stain     Status: None  (Preliminary result)   Collection Time: 09/21/16  2:25 PM  Result Value Ref Range Status   Fungus Stain Final report  Final    Comment: (NOTE) Performed At: [facility] [address] [place], [place] [id number] Lindon Romp MD ZD:[id number]    Fungus (Mycology) Culture PENDING  Incomplete   Fungal Source SINUS CONTENTS  Final  Culture, routine-sinus     Status: None   Collection Time: 09/21/16  2:25 PM  Result Value Ref Range Status   Specimen Description ABSCESS  Final   Special Requests   Final    SINUS CONTENTS PT ON ROCEPHIN,NISTATIN,AMPB,VANC,FLAGYL,BACTROBAN   Culture NO GROWTH 3 DAYS  Final   Report Status 09/24/2016 FINAL  Final  Fungus Culture Result     Status: None   Collection Time: 09/21/16  2:25 PM  Result Value Ref Range Status   Result 1 Comment  Final    Comment: (NOTE) KOH/Calcofluor preparation:  no fungus observed. Performed At: [facility] [place], [place] [id number] Lindon Romp MD AY:[id number]      Labs: Basic Metabolic Panel:  Recent Labs Lab 09/22/16 0610 09/23/16 0311 09/24/16 0533 09/25/16 0251 09/26/16 0548 09/28/16 0521  NA 135 136 135 136 135 140  K 3.5 2.8* 3.7 4.3 4.3 5.3*  CL 106 108 110 113* 109 108  CO2 21* 22 19* 17* 21* 23  GLUCOSE 227* 102* 87 140* 143* 202*  BUN 28* 29* 36* 38*  37* 46*  CREATININE 1.07 1.06 1.23 1.30* 1.25* 1.34*  CALCIUM 8.5* 8.7* 8.7* 8.7* 9.0 8.3*  MG 2.1 2.0  --  1.9 2.1  --    Liver Function Tests:  Recent Labs Lab 09/22/16 0610 09/23/16 0311 09/24/16 0533  AST 38 26 29  ALT 23 24 22   ALKPHOS 52 54 56  BILITOT 1.2 0.3 0.5  PROT 4.5* 4.5* 4.3*  ALBUMIN 1.7* 1.8* 1.8*   CBC:  Recent Labs Lab 09/24/16 0533 09/28/16 0521  WBC 14.4* 13.9*  HGB 10.0* 8.7*  HCT 29.6* 24.3*  MCV 96.7 93.8  PLT 107* 82*   CBG:  Recent Labs Lab 09/27/16 1126 09/27/16 1635 09/27/16 2105 09/28/16 0756 09/28/16 1223  GLUCAP 130* 251* 259* 204* 249*      IMAGING STUDIES Dg Chest 2 View  Result Date: 09/03/2016 CLINICAL DATA:  Back pain EXAM: CHEST  2 VIEW COMPARISON:  08/29/2016 FINDINGS: Prior CABG. Heart is borderline in size. Mild peribronchial thickening and interstitial prominence. Scarring in the left base. No acute opacities or effusions. No acute bony abnormality. IMPRESSION: Chronic bronchitic changes. Left basilar scarring. No active disease. Electronically Signed   By: Rolm Baptise M.D.   On: 09/03/2016 12:02   Ct Head Wo Contrast  Result Date: 09/18/2016 CLINICAL DATA:  Aphasia EXAM: CT HEAD WITHOUT CONTRAST TECHNIQUE: Contiguous axial images were obtained from the base of the skull through the vertex without intravenous contrast. COMPARISON:  August 08, 2005 FINDINGS: Brain: There is mild diffuse atrophy. There is no intracranial mass, hemorrhage, extra-axial fluid collection, or midline shift. There is decreased attenuation in the mid left frontal lobe. There is a suggestion within this area of decreased attenuation of a focal area of decreased attenuation with surrounding rim of increased attenuation measuring 7 x 7 mm, best seen on axial slice 21 series 2. There is localized sulcal effacement in the superior left frontal lobe in the area of this lesion. Elsewhere, there is small vessel disease in the centra semiovale bilaterally as well as in the left external capsule. Vascular: There is no hyperdense vessel. There is calcification in each carotid siphon region. Skull: The bony calvarium appears intact. Sinuses/Orbits: There is opacification of the left maxillary antrum with extension of soft tissue material medial to the left maxillary antrum into the left nasal cavity with localized nares obstruction on the left. There is medial bowing of the medial wall of the left maxillary antrum. Elsewhere, there is opacification in superior left ethmoid air cells. There is mild mucosal thickening in the inferior left frontal sinus region. Orbits  appear symmetric bilaterally. Other: Mastoid air cells are clear. IMPRESSION: 1. Decreased attenuation in the mid superior left frontal lobe. Within this area, there is a questionable 7 x 7 mm focus of decreased attenuation with increased attenuation periphery. Question small mass or abscess with edema in this area. An acute infarct in this area is an alternative differential consideration cannot be excluded on this noncontrast enhanced study. Brain MRI pre and post-contrast administration could be most helpful for further delineation in this regard. 2. Elsewhere, there is atrophy with patchy periventricular small vessel disease and small vessel disease throughout the left external capsule. No hemorrhage evident. 3. Areas of paranasal sinus disease. Extensive opacification in the left maxillary antrum with extension medially into the left nasal cavity with partial obstruction of the left nasal cavity. Suspect antrochoanal polyp with mucocele in this area. Areas of paranasal sinus disease also noted in the left anterior ethmoid and  inferior left frontal regions. ENT evaluation may well be warranted given the changes arising from the left maxillary antrum. 4.  Areas of arterial vascular calcification noted. These results will be called to the ordering clinician or representative by the Radiologist Assistant, and communication documented in the PACS or zVision Dashboard. Electronically Signed   By: Lowella Grip III M.D.   On: 09/18/2016 15:25   Ct Angio Chest Pe W And/or Wo Contrast  Result Date: 09/03/2016 CLINICAL DATA:  Upper back pain. Clinical concern for pulmonary embolus versus dissection. EXAM: CT ANGIOGRAPHY CHEST WITH CONTRAST TECHNIQUE: Multidetector CT imaging of the chest was performed using the standard protocol during bolus administration of intravenous contrast. Multiplanar CT image reconstructions and MIPs were obtained to evaluate the vascular anatomy. CONTRAST:  80 cc Isovue 370  intravenously. COMPARISON:  None. FINDINGS: Cardiovascular: Satisfactory opacification of the pulmonary arteries to the segmental level. No evidence of pulmonary embolism. Enlarged heart. No pericardial effusion. Heavy calcific atherosclerotic disease of the coronary arteries. Postsurgical changes from CABG. The thoracic aorta is very tortuous, with an ectatic portion of the descending thoracic aorta measuring 3.8 cm in greatest diameter. Heavy calcified and noncalcified plaque is seen along the aorta with asymmetric noncalcified plaque versus blood clot along the posterior wall of the descending aorta. There is a small amount of high density material along the medial wall of the descending aorta, image 73/sequence 10 extending inferiorly to the level of the mid thorax. Low fluid density material is seen along the posterior wall of the aorta in the distal thorax. Mediastinum/Nodes: No enlarged mediastinal, hilar, or axillary lymph nodes. Thyroid gland, trachea, and esophagus demonstrate no significant findings. Lungs/Pleura: Low lung volumes with mild interstitial lung changes and hypoventilatory changes in the lower lobes. Upper Abdomen: No acute abnormality. Musculoskeletal: No chest wall abnormality. No acute or significant osseous findings. Review of the MIP images confirms the above findings. IMPRESSION: Ectatic aorta with eccentric calcified and noncalcified plaque versus intramural blood clot, especially along the posterior wall of the descending aorta. High density material seen along the medial wall of the descending aorta, concerning for acute aortic syndrome, possibly small area of dissection or penetrating ulcer, which is not well visualized on this study, targeting better visualization of the pulmonary arteries. Surgical consultation is recommended. CT angiogram with and without contrast to better visualize the thoracic aorta may be considered if found clinically feasible. Aortic Atherosclerosis  (ICD10-I70.0). No evidence of pulmonary embolus. Enlarged heart with heavily calcified coronary arteries. These results were called by telephone at the time of interpretation on 09/03/2016 at 2:16 pm to Dr. Davonna Belling , who verbally acknowledged these results. Electronically Signed   By: Fidela Salisbury M.D.   On: 09/03/2016 14:22   Mr Jeri Cos MG Contrast  Result Date: 09/18/2016 CLINICAL DATA:  83 y/o M; fever and altered mental status. Concern for brain abscess. EXAM: MRI HEAD WITHOUT AND WITH CONTRAST TECHNIQUE: Multiplanar, multiecho pulse sequences of the brain and surrounding structures were obtained without and with intravenous contrast. CONTRAST:  16m MULTIHANCE GADOBENATE DIMEGLUMINE 529 MG/ML IV SOLN COMPARISON:  09/18/2016 CT head FINDINGS: Brain: Rim enhancing lesions with central reduced effusion within the left lentiform nucleus, caudate head, anterior corona radiata, extending into the left frontal lobe. The largest measures 26 x 18 x 14 mm (AP x ML x CC series 11, image 36 and series 12, image 17). There is a single additional subcentimeter focus within the left lateral temporal lobe (series 11, image 20). There  is associated surrounding T2 hyperintense signal abnormality in white matter and local mass effect with partial effacement of frontal horn of left lateral ventricle and 5 mm left-to-right midline shift. Background of mild chronic microvascular ischemic changes and moderate parenchymal volume loss of the brain. No hydrocephalus. No herniation. Small chronic infarction in the right cerebellar hemisphere. 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 Skull and upper cervical spine: Normal marrow signal. Sinuses/Orbits: Left maxillary and anterior ethmoid sinus opacification with a low signal nonenhancing lesion centered in the left ostiomeatal unit which may represent a inspissated mucocele or fungal ball. Bilateral  intra-ocular lens replacement. Other: None. IMPRESSION: 1. Rim enhancing lesion with central reduced diffusion in the left frontal lobe consistent with abscess measuring up to 26 mm. Multiple additional subcentimeter abscesses throughout the left frontal lobe and left basal ganglia. Single subcentimeter abscess and left lateral temporal lobe. 2. Edema and associated mass effect results in partial effacement of frontal horn of left lateral ventricle and 5 mm of left-to-right midline shift. 3. 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 4. Left ostiomeatal unit nonenhancing low signal lesion may represent mucocele or possibly a fungal ball. Direct visualization recommended. These results will be called to the ordering clinician or representative by the Radiologist Assistant, and communication documented in the PACS or zVision Dashboard. Electronically Signed   By: Kristine Garbe M.D.   On: 09/18/2016 18:36   Mr Jodene Nam Chest Wo Contrast  Result Date: 09/06/2016 CLINICAL DATA:  82 year old with mid back pain. Evaluate for aortic dissection. EXAM: MRA CHEST WITH OR WITHOUT CONTRAST TECHNIQUE: Angiographic images of the chest were obtained using MRA technique without contrast. CONTRAST:  None COMPARISON:  Chest CT 09/03/2016 and CT from 02/09/2016 FINDINGS: VASCULAR Aorta: Mid ascending thoracic aorta is ectatic measuring 3.8 cm but no evidence for a dissection involving the ascending thoracic aorta or aortic arch. No gross abnormality involving the great vessels. Again noted is atherosclerotic plaque involving the descending thoracic aorta. In addition, there is abnormal signal along the medial aspect of the proximal descending thoracic aorta and this corresponds with the recent CTA findings. The configuration is concerning for an intramural hematoma or limited dissection at this area. The proximal descending thoracic aorta measures up to 4.4 cm and similar to the recent CTA. Again noted is a small amount  of fluid posterior to the mid descending thoracic aorta but this may actually represent pleural fluid and similar to the recent CTA. This dissection or intramural hematoma does not extend into the descending thoracic aorta or the aortic arch. Heart: Heart size is within normal limits.  Post CABG changes. Pulmonary Arteries: Stable appearance of the main pulmonary arteries. Other: Again noted are parenchymal densities in the lower lungs but limited evaluation on this MR examination. NON-VASCULAR Musculoskeletal: No gross abnormality to the spinal canal or bones. Upper abdomen: Evidence for a solitary right kidney with an extrarenal pelvis. Extensive motion artifact. Limited evaluation of the upper abdominal structures. IMPRESSION: Abnormal appearance of the proximal descending thoracic aorta. Findings remain compatible with an intramural hematoma or limited aortic dissection in this area. This aortic process is stable since 09/03/2016. Please note that the exam has technical limitations due to motion artifact and the lack of intravenous contrast. Ideally, this aortic process could be followed with a dedicated CTA dissection protocol, with and without contrast, if the patient's renal function can tolerate it. Again noted is a small amount of  fluid just posterior to the mid descending thoracic aorta which may actually be in the pleural space and similar to the recent CTA. 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 Findings are similar to the prior abdominal CT on 02/09/2016. Electronically Signed   By: Markus Daft M.D.   On: 09/06/2016 12:24   US Renal  Result Date: 09/06/2016 CLINICAL DATA:  Acute kidney injury.  Left nephrectomy 1996. EXAM: RENAL / URINARY TRACT ULTRASOUND COMPLETE COMPARISON:  07/22/2012 and CT 01/10/2013 FINDINGS: Right Kidney: Length: 12.5 cm. Echogenicity within normal limits. No mass or hydronephrosis visualized. Cysts versus prominent renal pelvis measuring 3.4 cm (prominent extrarenal  pelvis noted on previous CT). Left Kidney: Surgically absent. Bladder: Appears normal for degree of bladder distention. IMPRESSION: Normal size right kidney without hydronephrosis. Mildly prominent right renal pelvis versus 3.4 cm cyst. Surgical absence of the left kidney. Electronically Signed   By: Marin Olp M.D.   On: 09/06/2016 12:40   Ir Fluoro Guide Cv Line Right  Result Date: 09/27/2016 CLINICAL DATA:  Brain abscess, needs durable venous access for IV antibiotic regimen EXAM: PICC PLACEMENT WITH ULTRASOUND AND FLUOROSCOPY FLUOROSCOPY TIME:  12 seconds, 1 mGy TECHNIQUE: After written informed consent was obtained, patient was placed in the supine position on angiographic table. Patency of the right basilic vein was confirmed with ultrasound with image documentation. An appropriate skin site was determined. Skin site was marked. Region was prepped using maximum barrier technique including cap and mask, sterile gown, sterile gloves, large sterile sheet, and Chlorhexidine as cutaneous antisepsis. The region was infiltrated locally with 1% lidocaine. Under real-time ultrasound guidance, the right basilic vein was accessed with a 21 gauge micropuncture needle; the needle tip within the vein was confirmed with ultrasound image documentation. Needle exchanged over a 018 guidewire for a peel-away sheath, through which a 5-French double-lumen power injectable PICC trimmed to 36cm was advanced, positioned with its tip near the cavoatrial junction. Spot chest radiograph confirms appropriate catheter position. Catheter was flushed per protocol and secured externally. The patient tolerated procedure well. COMPLICATIONS: COMPLICATIONS none IMPRESSION: 1. Technically successful five [place] double lumen power injectable PICC placement Electronically Signed   By: Lucrezia Europe M.D.   On: 09/27/2016 09:38   Ir US Guide Vasc Access Right  Result Date: 09/27/2016 CLINICAL DATA:  Brain abscess, needs durable venous access  for IV antibiotic regimen EXAM: PICC PLACEMENT WITH ULTRASOUND AND FLUOROSCOPY FLUOROSCOPY TIME:  12 seconds, 1 mGy TECHNIQUE: After written informed consent was obtained, patient was placed in the supine position on angiographic table. Patency of the right basilic vein was confirmed with ultrasound with image documentation. An appropriate skin site was determined. Skin site was marked. Region was prepped using maximum barrier technique including cap and mask, sterile gown, sterile gloves, large sterile sheet, and Chlorhexidine as cutaneous antisepsis. The region was infiltrated locally with 1% lidocaine. Under real-time ultrasound guidance, the right basilic vein was accessed with a 21 gauge micropuncture needle; the needle tip within the vein was confirmed with ultrasound image documentation. Needle exchanged over a 018 guidewire for a peel-away sheath, through which a 5-French double-lumen power injectable PICC trimmed to 36cm was advanced, positioned with its tip near the cavoatrial junction. Spot chest radiograph confirms appropriate catheter position. Catheter was flushed per protocol and secured externally. The patient tolerated procedure well. COMPLICATIONS: COMPLICATIONS none IMPRESSION: 1. Technically successful five [place] double lumen power injectable PICC placement Electronically Signed   By: Lucrezia Europe M.D.   On: 09/27/2016  09:38   Dg Chest Port 1 View  Result Date: 09/17/2016 CLINICAL DATA:  Cough EXAM: PORTABLE CHEST 1 VIEW COMPARISON:  09/04/2016 chest radiograph FINDINGS: Intact sternotomy wires. CABG clips overlie the mediastinum. Stable cardiomediastinal silhouette with mild cardiomegaly and aortic atherosclerosis. No pneumothorax. No pleural effusion. Cephalization of the pulmonary vasculature without overt pulmonary edema. Stable reticular opacities at both lung bases. IMPRESSION: 1. Stable mild cardiomegaly without overt pulmonary edema. 2. Chronic bibasilar reticular lung opacities, favor  scarring or atelectasis. Electronically Signed   By: Ilona Sorrel M.D.   On: 09/17/2016 18:02   Dg Chest Port 1 View  Result Date: 09/04/2016 CLINICAL DATA:  Aortic dissection. EXAM: PORTABLE CHEST 1 VIEW COMPARISON:  CT 09/03/2016.  Chest x-ray 09/03/2016. FINDINGS: Prior CABG. Heart size stable. Tortuous thoracic aorta again noted, reference made to prior CT report 09/03/2016. Heart size stable. Low lung volumes with basilar atelectasis. No pleural effusion or pneumothorax . IMPRESSION: 1. Prior CABG. Heart size stable. Tortuous thoracic aorta again noted, reference made to prior CT report 09/03/2016. 2.  Bibasilar subsegmental atelectasis. Electronically Signed   By: Marcello Moores  Register   On: 09/04/2016 06:48   Ct Maxillofacial Wo Contrast  Result Date: 09/21/2016 CLINICAL DATA:  Sinusitis.  Brain abscesses. EXAM: CT MAXILLOFACIAL WITHOUT CONTRAST TECHNIQUE: Multidetector CT images of the paranasal sinuses were obtained using the standard protocol without intravenous contrast. COMPARISON:  Head CT and MRI 09/18/2016 FINDINGS: Paranasal sinuses: Frontal: Large left frontal sinus which extends across the midline to the right with only minimal mucosal thickening inferiorly. Extremely hypoplastic/ aplastic right frontal sinus. Patent frontal sinus drainage pathways. Ethmoid: Partial opacification of a few anterior/ mid left ethmoid air cells. Clear right ethmoid air cells. Maxillary: Near complete opacification of the left maxillary sinus by relatively homogeneous intermediate density soft tissue which bulges medially into the left nasal cavity. Diffuse left maxillary sinus osteitis is compatible with a component of chronic sinusitis. No osseous erosion is identified. The right maxillary sinus is clear. Sphenoid: Normally aerated. Patent sphenoethmoidal recesses. Right ostiomeatal unit: Patent. Left ostiomeatal unit: Left maxillary sinus material extending through the ostium into the nasal cavity as above. Nasal  passages: Leftward nasal septal deviation anteriorly by 7 mm. Asymmetrically small left middle and left inferior nasal turbinates. Paradoxical rotation of the right middle turbinate. Anatomy: No pneumatization superior to anterior ethmoid notches. Intact olfactory grooves and fovea ethmoidalis, Keros I vs II on the right and Keros I on the left. Sellar sphenoid pneumatization pattern. No dehiscence of carotid or optic canals. Bilateral onodi cells are present. Other: Brain findings, including left frontal lobe edema, are better evaluated on recent MRI. Prior bilateral cataract extraction is noted. Calcified atherosclerosis is present at the skullbase. IMPRESSION: Evidence of chronic left maxillary sinusitis with near complete sinus opacification currently. Sinus material bulges into the nasal cavity and may reflect infection (including chronic fungal infections/mycetoma), polyp, or mucocele. Electronically Signed   By: Logan Bores M.D.   On: 09/21/2016 11:20    DISCHARGE EXAMINATION: Vitals:   09/27/16 1450 09/27/16 1957 09/28/16 0348 09/28/16 0850  BP: 112/62 121/77 117/67 (!) 136/58  Pulse: (!) 58 61 71 78  Resp: 20 19 19    Temp: 98.4 F (36.9 C) 98.4 F (36.9 C) 98.4 F (36.9 C)   TempSrc: Axillary Oral Oral   SpO2: 97% 98% 98%   Weight:      Height:       General appearance: alert, cooperative and no distress Resp: clear to auscultation bilaterally  Cardio: regular rate and rhythm, S1, S2 normal, no murmur, click, rub or gallop GI: soft, non-tender; bowel sounds normal; no masses,  no organomegaly Extremities: extremities normal, atraumatic, no cyanosis or edema  DISPOSITION: SNF  Discharge Instructions    Home infusion instructions [facility] May follow [place] Dosing Protocol; May administer Cathflo as needed to maintain patency of vascular access device.; Flushing of vascular access device: per [facility] Protocol: 0.9% NaCl pre/post medica...    Complete by:  As directed     Instructions:  May follow [place] Dosing Protocol   Instructions:  May administer Cathflo as needed to maintain patency of vascular access device.   Instructions:  Flushing of vascular access device: per [facility] Protocol: 0.9% NaCl pre/post medication administration and prn patency; Heparin 100 u/ml, 60m for implanted ports and Heparin 10u/ml, 5131mfor all other central venous catheters.   Instructions:  May follow [facility] Anaphylaxis Protocol for First Dose Administration in the home: 0.9% NaCl at 25-50 ml/hr to maintain IV access for protocol meds. Epinephrine 0.3 ml IV/IM PRN and Benadryl 25-50 IV/IM PRN s/s of anaphylaxis.   Instructions:  [facility] Coordinator (RN) to assist per patient IV care needs in the home PRN.      ALLERGIES:  Allergies  Allergen Reactions  . Colchicine Other (See Comments)    Reaction:  Unknown      Current Discharge Medication List    START taking these medications   Details  anidulafungin (ERAXIS) IVPB Inject 100 mg into the vein daily. Indication:  CNS fungal infection Last Day of Therapy:  12/27/16 Labs - Once weekly:  CBC/D and BMP, Labs - Every other week:  ESR and CRP Qty: 90 Units, Refills: 0    dexamethasone (DECADRON) 4 MG tablet [id number] daily for 1 week and then 2 mg twice daily till follow up with Infectious Diseases.    feeding supplement, GLUCERNA SHAKE, (GLUCERNA SHAKE) LIQD Take 237 mLs by mouth 3 (three) times daily between meals. Refills: 0    furosemide (LASIX) 20 MG tablet Take 1 tablet (20 mg total) by mouth every other day. Qty: 30 tablet    !! insulin aspart (NOVOLOG) 100 UNIT/ML injection Inject 3 Units into the skin 3 (three) times daily with meals. Qty: 10 mL, Refills: 11    !! insulin aspart (NOVOLOG) 100 UNIT/ML injection Use following sliding scale three times a day. Check CBG before each meal.  For CBG less than 120: No insulin CBG: 121-150: 2 units SQ CBG: 151-200: 3 Units SQ CBG: 201-250: 5 Units  SQ CBG: 251-300: 7 units and call MD for further instructions Qty: 10 mL, Refills: 11    oxymetazoline (AFRIN) 0.05 % nasal spray Place 1 spray into both nostrils once as needed for congestion (Bedside procedure). Qty: 30 mL, Refills: 0    voriconazole (VFEND) 50 MG tablet Take 7 tablets (350 mg total) by mouth 2 (two) times daily. Indication:  CNS fungal infection Last Day of Therapy:  12/27/16 Labs - Once weekly:  CBC/D and BMP Labs - Every other week:  ESR and CRP' Labs- Every 5 days: Voriconazole level Qty: 180 tablet, Refills: 0     !! - Potential duplicate medications found. Please discuss with provider.    CONTINUE these medications which have NOT CHANGED   Details  atorvastatin (LIPITOR) 80 MG tablet Take 1 tablet (80 mg total) by mouth daily. Qty: 90 tablet, Refills: 1    cyanocobalamin 100 MCG tablet Take  100 mcg by mouth daily.     febuxostat (ULORIC) 40 MG tablet Take 40 mg by mouth daily.     folic acid (FOLVITE) 1 MG tablet Take 1 mg by mouth daily.    hydrALAZINE (APRESOLINE) 100 MG tablet Take 100 mg by mouth every 8 (eight) hours.    ipratropium-albuterol (DUONEB) 0.5-2.5 (3) MG/3ML SOLN Take 3 mLs by nebulization every 6 (six) hours as needed (for wheezing/shortness of breath).    metoprolol tartrate (LOPRESSOR) 50 MG tablet Take 1 tablet (50 mg total) by mouth 2 (two) times daily. Qty: 60 tablet, Refills: 0    Multiple Vitamins-Minerals (CENTRUM SILVER) tablet Take 1 tablet by mouth daily.     nitroGLYCERIN (NITROSTAT) 0.4 MG SL tablet Place 1 tablet (0.4 mg total) under the tongue every 5 (five) minutes as needed for chest pain. Qty: 25 tablet, Refills: 6    omega-3 acid ethyl esters (LOVAZA) 1 g capsule Take 2 g by mouth daily.    omeprazole (PRILOSEC) 40 MG capsule Take 1 capsule (40 mg total) by mouth daily. Qty: 90 capsule, Refills: 3    polyethylene glycol (MIRALAX / GLYCOLAX) packet Take 17 g by mouth daily as needed for mild constipation or  moderate constipation.     saccharomyces boulardii (FLORASTOR) 250 MG capsule Take 250 mg by mouth daily.      STOP taking these medications     ALPRAZolam (XANAX) 0.25 MG tablet      Flaxseed, Linseed, (FLAXSEED OIL) 1000 MG CAPS      gabapentin (NEURONTIN) 400 MG capsule      glucosamine-chondroitin 500-400 MG tablet      isosorbide dinitrate (ISORDIL) 10 MG tablet      methotrexate (RHEUMATREX) 2.5 MG tablet      oxyCODONE-acetaminophen (PERCOCET/ROXICET) 5-325 MG tablet      predniSONE (DELTASONE) 2.5 MG tablet          Contact information for after-discharge care    Destination    [facility] Follow up.   Specialty:  Skilled Nursing Facility Contact information: [place]              TOTAL DISCHARGE TIME: 56 minutes  [facility] Pager [phone]  09/28/2016, 12:59 PM

## 2016-09-28 NOTE — Clinical Social Work Placement (Signed)
   CLINICAL SOCIAL WORK PLACEMENT  NOTE  Date:  09/28/2016  Patient Details  Name: Derrick Fry MRN: [id number] Date of Birth: [birth year]  Clinical Social Work is seeking post-discharge placement for this patient at the [place] level of care (*CSW will initial, date and re-position this form in  chart as items are completed):      Patient/family provided with [place] Work Department's list of facilities offering this level of care within the geographic area requested by the patient (or if unable, by the patient's family).  Yes   Patient/family informed of their freedom to choose among providers that offer the needed level of care, that participate in Medicare, Medicaid or managed care program needed by the patient, have an available bed and are willing to accept the patient.      Patient/family informed of [place]'s ownership interest in [facility] and [facility], as well as of the fact that they are under no obligation to receive care at these facilities.  PASRR submitted to EDS on       PASRR number received on 09/25/16     Existing PASRR number confirmed on       FL2 transmitted to all facilities in geographic area requested by pt/family on 09/25/16     FL2 transmitted to all facilities within larger geographic area on       Patient informed that his/her managed care company has contracts with or will negotiate with certain facilities, including the following:        Yes   Patient/family informed of bed offers received.  Patient chooses bed at [facility]     Physician recommends and patient chooses bed at      Patient to be transferred to [facility] on 09/28/16.  Patient to be transferred to facility by [facility]     Patient family notified on 09/28/16 of transfer.  Name of family member notified:  Wells Guiles     PHYSICIAN Please prepare priority discharge summary, including medications, Please sign DNR, Please prepare  prescriptions     Additional Comment:    _______________________________________________ Eileen Stanford, LCSW 09/28/2016, 11:05 AM

## 2016-09-28 NOTE — Clinical Social Work Note (Addendum)
Clinical Social Worker facilitated patient discharge including contacting patient family and facility to confirm patient discharge plans.  Clinical information faxed to facility and family agreeable with plan.  CSW arranged ambulance transport via [facility] to [facility] . [facility] estimated it will be about a hour before arrival.  RN to call [phone] (ask for [place] nurse station) for report prior to discharge. Patient going to room 806B in [facility].  Clinical Social Worker will sign off for now as social work intervention is no longer needed. Please consult [place] again if new need arises.  [place], [place]

## 2016-09-28 NOTE — Progress Notes (Signed)
Occupational Therapy Treatment Patient Details Name: Derrick Fry MRN: [id number] DOB: 10-24-1927 Today's Date: 09/28/2016    History of present illness Pt was admitted to [facility] for fever and AMS. Since found fungal sinus and CNS infection. Pt transferred to [facility] for L maxillary antrostomy with stripping and anterior ethmoidectomy with fusion on 7/19. PMH includes DM, AAA s/p repair, CAD, CKD, MI s/p CABG X 5, and pulmonary fibrosis.    OT comments  Pt able to perform stand pivot x1 chair > BSC with mod assist +2. Utilized Stedy with max assist +2 for transfer BSC > bed. Pt continues to fatigue quickly requiring increased assists when progressing activity. Pt required total assist for peri care and max assist +2 for bed mobility. D/c plan remains appropriate. Will continue to follow acutely.   Follow Up Recommendations  SNF;Supervision/Assistance - 24 hour    Equipment Recommendations  Other (comment) (TBD at next venue)    Recommendations for Other Services      Precautions / Restrictions Precautions Precautions: Fall Restrictions Weight Bearing Restrictions: No RLE Weight Bearing: Weight bearing as tolerated       Mobility Bed Mobility Overal bed mobility: Needs Assistance Bed Mobility: Sit to Supine;Rolling Rolling: Max assist     Sit to supine: Max assist;+2 for physical assistance;HOB elevated   General bed mobility comments: Pt required Max A to return to bed after treatment as pt fatigues quickly. Assistance provided to elevate LEs onto bed, control descent of trunk., and scoot up in bed. Max A to roll pt for nurse to apply bandage to sacral area.  Transfers Overall transfer level: Needs assistance Equipment used: Rolling walker (2 wheeled) Transfers: Sit to/from Stand Sit to Stand: +2 physical assistance;Max assist;Mod assist         General transfer comment: Mod A +2  with RW for first transfer from recliner to [facility]. Pt required Max A +2 with steady for second  transfer as pt fatigues quickly. Multimodal cues for hand placement during transfer.    Balance Overall balance assessment: Needs assistance Sitting-balance support: Bilateral upper extremity supported;Feet supported Sitting balance-Leahy Scale: Poor Sitting balance - Comments: Able to perform bilateral coordination and maintain sitting balance during groomign at EOB   Standing balance support: Bilateral upper extremity supported;During functional activity Standing balance-Leahy Scale: Poor Standing balance comment: Reliant on UE support                           ADL either performed or assessed with clinical judgement   ADL Overall ADL's : Needs assistance/impaired                         Toilet Transfer: Moderate assistance;+2 for physical assistance;BSC;RW Toilet Transfer Details (indicate cue type and reason): mod assist for stand pivot chair > BSC, Stedy used for BSC>bed with max assist +2 Toileting- Clothing Manipulation and Hygiene: Total assistance;Sit to/from stand Toileting - Clothing Manipulation Details (indicate cue type and reason): with use of Stedy     Functional mobility during ADLs: Moderate assistance;+2 for physical assistance (for stand pivot, max +2 with [place])       Vision       Perception     Praxis      Cognition Arousal/Alertness: Awake/alert Behavior During Therapy: Flat affect Overall Cognitive Status: Impaired/Different from baseline Area of Impairment: Awareness;Following commands;Problem solving  Following Commands: Follows one step commands inconsistently Safety/Judgement: Decreased awareness of deficits;Decreased awareness of safety Awareness: Anticipatory Problem Solving: Slow processing;Decreased initiation;Requires verbal cues;Requires tactile cues;Difficulty sequencing General Comments: Pt not very verbal during Tx, Difficulty following commands         Exercises Exercises:  General Upper Extremity   Shoulder Instructions       General Comments Pt with several small wounds on arms. Mobility tech present applied bandages. Redness noted around sacral area and pt c/o pain on backside. RN notified and assisted RN rolling pt to apply bandage to sacral region.     Pertinent Vitals/ Pain       Pain Assessment: Faces Faces Pain Scale: Hurts even more Pain Descriptors / Indicators: Aching;Sore Pain Intervention(s): Monitored during session;Premedicated before session;Limited activity within patient's tolerance  Home Living                                          Prior Functioning/Environment              Frequency  Min 2X/week        Progress Toward Goals  OT Goals(current goals can now be found in the care plan section)  Progress towards OT goals: Progressing toward goals  Acute Rehab OT Goals Patient Stated Goal: to get better OT Goal Formulation: With patient  Plan Discharge plan remains appropriate    Co-evaluation    PT/OT/SLP Co-Evaluation/Treatment: Yes Reason for Co-Treatment: Complexity of the patient's impairments (multi-system involvement);For patient/therapist safety;To address functional/ADL transfers   OT goals addressed during session: ADL's and self-care      AM-PAC PT "6 Clicks" Daily Activity     Outcome Measure   Help from another person eating meals?: None Help from another person taking care of personal grooming?: A Little Help from another person toileting, which includes using toliet, bedpan, or urinal?: Total Help from another person bathing (including washing, rinsing, drying)?: Total Help from another person to put on and taking off regular upper body clothing?: A Lot Help from another person to put on and taking off regular lower body clothing?: Total 6 Click Score: 12    End of Session Equipment Utilized During Treatment: Gait belt;Rolling walker;Other (comment) Charlaine Dalton)  OT Visit  Diagnosis: Unsteadiness on feet (R26.81);Other abnormalities of gait and mobility (R26.89);Muscle weakness (generalized) (M62.81);Other symptoms and signs involving cognitive function   Activity Tolerance Patient limited by fatigue;Patient tolerated treatment well   Patient Left in bed;with call bell/phone within reach;with nursing/sitter in room;with family/visitor present   Nurse Communication Mobility status;Other (comment) (pt with multiple skin tears and leaking)        Time: 0102-7253 OT Time Calculation (min): 29 min  Charges: OT General Charges $OT Visit: 1 Procedure OT Treatments $Self Care/Home Management : 8-22 mins  Khyren Hing A. Ulice Brilliant, M.S., OTR/L Pager: [place] 09/28/2016, 4:26 PM

## 2016-09-28 NOTE — Care Management Note (Signed)
Case Management Note Original Note by Marjie Skiff. Derrick Kehr, RN  Case Manager  Patient Details  Name: Derrick Fry MRN: [id number] Date of Birth: 02-16-1928  Subjective/Objective:     83 yo admitted with Acute lower UTI.               Action/Plan: From [facility]. CSW assisting with DC planning. CM will assist as needed.  Expected Discharge Date:  09/28/16               Expected Discharge Plan:  Skilled Nursing Facility  In-House Referral:  Clinical Social Work  Discharge planning Services  CM Consult  Post Acute Care Choice:    Choice offered to:     DME Arranged:    DME Agency:     HH Arranged:    [place] Agency:     Status of Service:  Completed, signed off  If discussed at [employer], dates discussed:    Additional Comments:  09/28/16 J. Freida Nebel, RN, BSN Pt medically stable for discharge to SNF today, per MD.   Plan discharge to [facility] today, per CSW arrangements.    Derrick Bodo, RN 09/28/2016, 4:23 PM  [phone]

## 2016-09-28 NOTE — Progress Notes (Signed)
Mr. Silvernail left via [facility] at 1615 with VSS in no obvious distress. Report was called to [employer] at [facility] at 1600.

## 2016-10-01 ENCOUNTER — Emergency Department (HOSPITAL_COMMUNITY): Payer: PPO

## 2016-10-01 ENCOUNTER — Inpatient Hospital Stay (HOSPITAL_COMMUNITY)
Admission: EM | Admit: 2016-10-01 | Discharge: 2016-10-03 | DRG: 871 | Disposition: A | Discharge status: Hospice | Payer: PPO | Attending: Internal Medicine | Admitting: Internal Medicine

## 2016-10-01 DIAGNOSIS — I110 Hypertensive heart disease with heart failure: Secondary | ICD-10-CM | POA: Diagnosis not present

## 2016-10-01 DIAGNOSIS — I472 Ventricular tachycardia: Secondary | ICD-10-CM | POA: Diagnosis not present

## 2016-10-01 DIAGNOSIS — Z515 Encounter for palliative care: Secondary | ICD-10-CM

## 2016-10-01 DIAGNOSIS — E785 Hyperlipidemia, unspecified: Secondary | ICD-10-CM | POA: Diagnosis present

## 2016-10-01 DIAGNOSIS — R4182 Altered mental status, unspecified: Secondary | ICD-10-CM | POA: Diagnosis not present

## 2016-10-01 DIAGNOSIS — C060 Malignant neoplasm of cheek mucosa: Secondary | ICD-10-CM | POA: Diagnosis not present

## 2016-10-01 DIAGNOSIS — G060 Intracranial abscess and granuloma: Secondary | ICD-10-CM | POA: Diagnosis not present

## 2016-10-01 DIAGNOSIS — J329 Chronic sinusitis, unspecified: Secondary | ICD-10-CM | POA: Diagnosis present

## 2016-10-01 DIAGNOSIS — R402431 Glasgow coma scale score 3-8, in the field [EMT or ambulance]: Secondary | ICD-10-CM | POA: Diagnosis not present

## 2016-10-01 DIAGNOSIS — I130 Hypertensive heart and chronic kidney disease with heart failure and stage 1 through stage 4 chronic kidney disease, or unspecified chronic kidney disease: Secondary | ICD-10-CM | POA: Diagnosis not present

## 2016-10-01 DIAGNOSIS — Z85828 Personal history of other malignant neoplasm of skin: Secondary | ICD-10-CM

## 2016-10-01 DIAGNOSIS — A419 Sepsis, unspecified organism: Secondary | ICD-10-CM | POA: Diagnosis not present

## 2016-10-01 DIAGNOSIS — I252 Old myocardial infarction: Secondary | ICD-10-CM

## 2016-10-01 DIAGNOSIS — E1122 Type 2 diabetes mellitus with diabetic chronic kidney disease: Secondary | ICD-10-CM | POA: Diagnosis present

## 2016-10-01 DIAGNOSIS — G4733 Obstructive sleep apnea (adult) (pediatric): Secondary | ICD-10-CM | POA: Diagnosis present

## 2016-10-01 DIAGNOSIS — Z8379 Family history of other diseases of the digestive system: Secondary | ICD-10-CM

## 2016-10-01 DIAGNOSIS — I7100 Dissection of unspecified site of aorta: Secondary | ICD-10-CM | POA: Diagnosis present

## 2016-10-01 DIAGNOSIS — R6520 Severe sepsis without septic shock: Secondary | ICD-10-CM | POA: Diagnosis present

## 2016-10-01 DIAGNOSIS — Z86718 Personal history of other venous thrombosis and embolism: Secondary | ICD-10-CM

## 2016-10-01 DIAGNOSIS — N179 Acute kidney failure, unspecified: Secondary | ICD-10-CM | POA: Diagnosis not present

## 2016-10-01 DIAGNOSIS — J8410 Pulmonary fibrosis, unspecified: Secondary | ICD-10-CM | POA: Diagnosis present

## 2016-10-01 DIAGNOSIS — Z833 Family history of diabetes mellitus: Secondary | ICD-10-CM

## 2016-10-01 DIAGNOSIS — N183 Chronic kidney disease, stage 3 (moderate): Secondary | ICD-10-CM | POA: Diagnosis present

## 2016-10-01 DIAGNOSIS — Z794 Long term (current) use of insulin: Secondary | ICD-10-CM

## 2016-10-01 DIAGNOSIS — Z79899 Other long term (current) drug therapy: Secondary | ICD-10-CM

## 2016-10-01 DIAGNOSIS — G936 Cerebral edema: Secondary | ICD-10-CM | POA: Diagnosis not present

## 2016-10-01 DIAGNOSIS — Z96652 Presence of left artificial knee joint: Secondary | ICD-10-CM | POA: Diagnosis present

## 2016-10-01 DIAGNOSIS — B4489 Other forms of aspergillosis: Secondary | ICD-10-CM | POA: Diagnosis not present

## 2016-10-01 DIAGNOSIS — M109 Gout, unspecified: Secondary | ICD-10-CM | POA: Diagnosis present

## 2016-10-01 DIAGNOSIS — Z905 Acquired absence of kidney: Secondary | ICD-10-CM | POA: Diagnosis not present

## 2016-10-01 DIAGNOSIS — D696 Thrombocytopenia, unspecified: Secondary | ICD-10-CM | POA: Diagnosis present

## 2016-10-01 DIAGNOSIS — Q600 Renal agenesis, unilateral: Secondary | ICD-10-CM

## 2016-10-01 DIAGNOSIS — E8809 Other disorders of plasma-protein metabolism, not elsewhere classified: Secondary | ICD-10-CM | POA: Diagnosis not present

## 2016-10-01 DIAGNOSIS — Z87891 Personal history of nicotine dependence: Secondary | ICD-10-CM | POA: Diagnosis not present

## 2016-10-01 DIAGNOSIS — I4891 Unspecified atrial fibrillation: Secondary | ICD-10-CM | POA: Diagnosis present

## 2016-10-01 DIAGNOSIS — I2510 Atherosclerotic heart disease of native coronary artery without angina pectoris: Secondary | ICD-10-CM | POA: Diagnosis present

## 2016-10-01 DIAGNOSIS — Z8589 Personal history of malignant neoplasm of other organs and systems: Secondary | ICD-10-CM

## 2016-10-01 DIAGNOSIS — I5022 Chronic systolic (congestive) heart failure: Secondary | ICD-10-CM | POA: Diagnosis present

## 2016-10-01 DIAGNOSIS — Z8249 Family history of ischemic heart disease and other diseases of the circulatory system: Secondary | ICD-10-CM

## 2016-10-01 DIAGNOSIS — Z9049 Acquired absence of other specified parts of digestive tract: Secondary | ICD-10-CM | POA: Diagnosis not present

## 2016-10-01 DIAGNOSIS — Z888 Allergy status to other drugs, medicaments and biological substances status: Secondary | ICD-10-CM | POA: Diagnosis not present

## 2016-10-01 DIAGNOSIS — J320 Chronic maxillary sinusitis: Secondary | ICD-10-CM | POA: Diagnosis present

## 2016-10-01 DIAGNOSIS — B449 Aspergillosis, unspecified: Secondary | ICD-10-CM | POA: Diagnosis present

## 2016-10-01 DIAGNOSIS — E1140 Type 2 diabetes mellitus with diabetic neuropathy, unspecified: Secondary | ICD-10-CM | POA: Diagnosis present

## 2016-10-01 DIAGNOSIS — Z808 Family history of malignant neoplasm of other organs or systems: Secondary | ICD-10-CM

## 2016-10-01 DIAGNOSIS — Z66 Do not resuscitate: Secondary | ICD-10-CM | POA: Diagnosis not present

## 2016-10-01 DIAGNOSIS — Z801 Family history of malignant neoplasm of trachea, bronchus and lung: Secondary | ICD-10-CM

## 2016-10-01 DIAGNOSIS — B488 Other specified mycoses: Secondary | ICD-10-CM | POA: Diagnosis not present

## 2016-10-01 DIAGNOSIS — G9341 Metabolic encephalopathy: Secondary | ICD-10-CM | POA: Diagnosis not present

## 2016-10-01 DIAGNOSIS — Z807 Family history of other malignant neoplasms of lymphoid, hematopoietic and related tissues: Secondary | ICD-10-CM

## 2016-10-01 DIAGNOSIS — I7102 Dissection of abdominal aorta: Secondary | ICD-10-CM | POA: Diagnosis present

## 2016-10-01 DIAGNOSIS — Z951 Presence of aortocoronary bypass graft: Secondary | ICD-10-CM

## 2016-10-01 DIAGNOSIS — I5023 Acute on chronic systolic (congestive) heart failure: Secondary | ICD-10-CM | POA: Diagnosis present

## 2016-10-01 DIAGNOSIS — Z86711 Personal history of pulmonary embolism: Secondary | ICD-10-CM

## 2016-10-01 DIAGNOSIS — I714 Abdominal aortic aneurysm, without rupture: Secondary | ICD-10-CM | POA: Diagnosis present

## 2016-10-01 DIAGNOSIS — E119 Type 2 diabetes mellitus without complications: Secondary | ICD-10-CM

## 2016-10-01 LAB — CBC WITH DIFFERENTIAL/PLATELET
BASOS ABS: 0 10*3/uL (ref 0.0–0.1)
BASOS PCT: 0 %
EOS ABS: 0 10*3/uL (ref 0.0–0.7)
EOS PCT: 0 %
HCT: 28 % — ABNORMAL LOW (ref 39.0–52.0)
Hemoglobin: 9.5 g/dL — ABNORMAL LOW (ref 13.0–17.0)
LYMPHS PCT: 4 %
Lymphs Abs: 0.7 10*3/uL (ref 0.7–4.0)
MCH: 33.1 pg (ref 26.0–34.0)
MCHC: 33.9 g/dL (ref 30.0–36.0)
MCV: 97.6 fL (ref 78.0–100.0)
Monocytes Absolute: 0.5 10*3/uL (ref 0.1–1.0)
Monocytes Relative: 3 %
Neutro Abs: 17.8 10*3/uL — ABNORMAL HIGH (ref 1.7–7.7)
Neutrophils Relative %: 94 %
PLATELETS: 65 10*3/uL — AB (ref 150–400)
RBC: 2.87 MIL/uL — AB (ref 4.22–5.81)
RDW: 19 % — AB (ref 11.5–15.5)
WBC: 19 10*3/uL — ABNORMAL HIGH (ref 4.0–10.5)

## 2016-10-01 LAB — URINALYSIS, ROUTINE W REFLEX MICROSCOPIC
BACTERIA UA: NONE SEEN
Bilirubin Urine: NEGATIVE
Glucose, UA: 50 mg/dL — AB
Hgb urine dipstick: NEGATIVE
KETONES UR: NEGATIVE mg/dL
Leukocytes, UA: NEGATIVE
Nitrite: NEGATIVE
PROTEIN: 100 mg/dL — AB
SQUAMOUS EPITHELIAL / LPF: NONE SEEN
Specific Gravity, Urine: 1.016 (ref 1.005–1.030)
pH: 6 (ref 5.0–8.0)

## 2016-10-01 LAB — COMPREHENSIVE METABOLIC PANEL
ALBUMIN: 2 g/dL — AB (ref 3.5–5.0)
ALT: 14 U/L — ABNORMAL LOW (ref 17–63)
AST: 36 U/L (ref 15–41)
Alkaline Phosphatase: 67 U/L (ref 38–126)
Anion gap: 9 (ref 5–15)
BILIRUBIN TOTAL: 0.9 mg/dL (ref 0.3–1.2)
BUN: 55 mg/dL — AB (ref 6–20)
CHLORIDE: 104 mmol/L (ref 101–111)
CO2: 29 mmol/L (ref 22–32)
Calcium: 9.1 mg/dL (ref 8.9–10.3)
Creatinine, Ser: 1.73 mg/dL — ABNORMAL HIGH (ref 0.61–1.24)
GFR calc Af Amer: 39 mL/min — ABNORMAL LOW (ref >60)
GFR calc non Af Amer: 34 mL/min — ABNORMAL LOW (ref >60)
GLUCOSE: 197 mg/dL — AB (ref 65–99)
POTASSIUM: 3.7 mmol/L (ref 3.5–5.1)
SODIUM: 142 mmol/L (ref 135–145)
TOTAL PROTEIN: 4.6 g/dL — AB (ref 6.5–8.1)

## 2016-10-01 LAB — I-STAT CG4 LACTIC ACID, ED: LACTIC ACID, VENOUS: 2.12 mmol/L — AB (ref 0.5–1.9)

## 2016-10-01 LAB — BRAIN NATRIURETIC PEPTIDE: B NATRIURETIC PEPTIDE 5: 2691.7 pg/mL — AB (ref 0.0–100.0)

## 2016-10-01 MED ORDER — GLYCOPYRROLATE 0.2 MG/ML IJ SOLN
0.2000 mg | INTRAMUSCULAR | Status: DC | PRN
  Filled 2016-10-01: qty 1

## 2016-10-01 MED ORDER — POLYVINYL ALCOHOL 1.4 % OP SOLN: 1.0000 [drp] | Freq: Four times a day (QID) | OPHTHALMIC | Status: DC | PRN

## 2016-10-01 MED ORDER — SODIUM CHLORIDE 0.9 % IV SOLN
Freq: Once | INTRAVENOUS | Status: AC
  Administered 2016-10-01: 20:00:00 via INTRAVENOUS

## 2016-10-01 MED ORDER — ONDANSETRON 4 MG PO TBDP: 4.0000 mg | ORAL_TABLET | Freq: Four times a day (QID) | ORAL | Status: DC | PRN

## 2016-10-01 MED ORDER — ACETAMINOPHEN 650 MG RE SUPP: 650.0000 mg | Freq: Four times a day (QID) | RECTAL | Status: DC | PRN

## 2016-10-01 MED ORDER — SODIUM CHLORIDE 0.9 % IV BOLUS (SEPSIS)
500.0000 mL | Freq: Once | INTRAVENOUS | Status: AC
  Administered 2016-10-01: 500 mL via INTRAVENOUS

## 2016-10-01 MED ORDER — VORICONAZOLE 200 MG IV SOLR
4.0000 mg/kg | Freq: Two times a day (BID) | INTRAVENOUS | Status: DC
  Filled 2016-10-01: qty 350

## 2016-10-01 MED ORDER — SODIUM CHLORIDE 0.9 % IV SOLN
100.0000 mg | INTRAVENOUS | Status: DC
  Filled 2016-10-01: qty 100

## 2016-10-01 MED ORDER — HALOPERIDOL 0.5 MG PO TABS
0.5000 mg | ORAL_TABLET | ORAL | Status: DC | PRN
  Filled 2016-10-01: qty 1

## 2016-10-01 MED ORDER — BIOTENE DRY MOUTH MT LIQD: 15.0000 mL | OROMUCOSAL | Status: DC | PRN

## 2016-10-01 MED ORDER — HALOPERIDOL LACTATE 2 MG/ML PO CONC: 0.5000 mg | ORAL | Status: DC | PRN

## 2016-10-01 MED ORDER — GLYCOPYRROLATE 0.2 MG/ML IJ SOLN
0.2000 mg | INTRAMUSCULAR | Status: DC | PRN
  Filled 2016-10-01 (×2): qty 1

## 2016-10-01 MED ORDER — MORPHINE BOLUS VIA INFUSION
1.0000 mg | INTRAVENOUS | Status: DC | PRN
  Administered 2016-10-03: 1 mg via INTRAVENOUS
  Filled 2016-10-01: qty 2

## 2016-10-01 MED ORDER — VORICONAZOLE 200 MG IV SOLR
4.0000 mg/kg | Freq: Once | INTRAVENOUS | Status: AC
  Administered 2016-10-01: 350 mg via INTRAVENOUS
  Filled 2016-10-01: qty 350

## 2016-10-01 MED ORDER — ACETAMINOPHEN 325 MG PO TABS: 650.0000 mg | ORAL_TABLET | Freq: Four times a day (QID) | ORAL | Status: DC | PRN

## 2016-10-01 MED ORDER — GLYCOPYRROLATE 1 MG PO TABS
1.0000 mg | ORAL_TABLET | ORAL | Status: DC | PRN
Start: 2016-10-01 — End: 2016-10-03

## 2016-10-01 MED ORDER — HALOPERIDOL LACTATE 5 MG/ML IJ SOLN: 0.5000 mg | INTRAMUSCULAR | Status: DC | PRN

## 2016-10-01 MED ORDER — ONDANSETRON HCL 4 MG/2ML IJ SOLN: 4.0000 mg | Freq: Four times a day (QID) | INTRAMUSCULAR | Status: DC | PRN

## 2016-10-01 MED ORDER — SODIUM CHLORIDE 0.9 % IV SOLN
1.0000 mg/h | INTRAVENOUS | Status: DC
  Administered 2016-10-01: 2 mg/h via INTRAVENOUS
  Filled 2016-10-01: qty 10

## 2016-10-01 NOTE — ED Triage Notes (Signed)
Received pt from [facility] with c/o febrile. Pt was recently admitted to hospital and discharged to SNF. Pt has foley already in place prior to arrival. Pt temp for EMS was 102.1.

## 2016-10-01 NOTE — Consult Note (Signed)
[facility] for Infectious Disease  Date of Admission:  10/01/2016  Date of Consult:  10/01/2016  Reason for Consult: Fungal sinusitis, Brain Abscess Referring Physician: Mesner  Impression/Recommendation Aspergillus  Brain Abscess AKI DM2  Would continue vori and andiula Hydrate as he tolerates Repeat his MRI of brain Control his blood sugar, consider checking A1C, sliding scale insulinwatch Cr- hopefully will improve with hydration.   Comment- His wife was talking about rehab placement at d/c, this evening. I encouraged her to think about short term goals, getting him well enough for d/c. His prognosis is grim if he has aspergillus in his brain abscess as well as his sinus. Unfortunately, have not been able to get sample from brain abscess. He may need "routine" antibiotics if he is worsening.   Thank you so much for this interesting consult,   Derrick Fry (pager) [phone] [email or web address]  Derrick Fry is an 83 y.o. male.  HPI: 83 yo M with hx of diet controlled, DM, CKD 3, single kidney, adm on 7-16 with worsening confusion. At that time he was found to have a L frontal lobe, temporal lobe, and basal ganglia abscesses as well as L sinus disease. He underwent L maxillary antrostomy and ethmoidectomy on 7-19. 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  He was treated with lipid ampho and voriconazole 7-19 to 7-23. He was then changed to vori/anidula. He was d/c to [facility] on 7-26.  Over the last 24h he stopped taking PO and had temp to 101 today. He was interacting with his family and staff poorly.  He was brought back to ED restared on IV vori and anidulafungin.  He opens eyes to voice but does not vocalize.   Past Medical History:  Diagnosis Date  .  peripheral neuropathy --UDS--pain mngmt  09/05/2006   Qualifier: Diagnosis of  By: Larose Kells MD, [place] AAA (abdominal aortic aneurysm) ([place])   . Anemia   . Aortic dissection ([place]) 09/03/2016  .  Arrhythmia 02/09/2016  . Arthritis    "all over" (02/10/2016)  . Basal cell carcinoma of face    "burned off" (02/10/2016)  . CAD (coronary artery disease)    MI 08[birth year]  . Chronic lower back pain   . Decreased cardiac ejection fraction 09/04/2016   EF 35%  . Diabetes mellitus with neuropathy ([place])    "borderline" (02/10/2016)  . DM II (diabetes mellitus, type II), controlled ([place]) 09/05/2006   Qualifier: Diagnosis of  By: Larose Kells MD, [place] DVT (deep venous thrombosis) ([place]) 02/2016   "left thigh"  . Essential hypertension 09/05/2006   Qualifier: Diagnosis of  By: Larose Kells MD, [place] Gait abnormality    chronic imbalance  . GAIT DISTURBANCE 08/22/2007   Qualifier: Diagnosis of  By: Larose Kells MD, [place] Gout    diskitis 10/2008, Dr Ouida Sills  . Gout 08/22/2007   Gout diagnosed 10-2008. he had a  L4 L5-S1 gout diskitis DEXA neg 3-09 and 08-2009   . Heart murmur dx'd 02/2016  . High cholesterol 03/23/2009   Qualifier: Diagnosis of  By: Larose Kells MD, [facility] History of DVT (deep vein thrombosis) 02/09/2016  . Hyperlipidemia   . Hypertension   . Myocardial infarction ([place]) 1987   "before OHS"  . OSA (obstructive sleep apnea)    Limited CPAP tolerance (02/10/2016)  . Osteoarthritis   . Pneumonia 1938   had right pneumonia  pleurisy requiring resection of ribs and chest tube drainage at age 67  . Pulmonary embolism ([employer] [facility] [employer]) 02/2016   "left"  . Pulmonary fibrosis ([place]) 01/29/2013  . Renal insufficiency    chronic w/ solitary kidney, congenital  . RENAL INSUFFICIENCY, CHRONIC 09/05/2006   Qualifier: Diagnosis of  By: Larose Kells MD, [place] SLEEP APNEA 09/05/2006   History of a sleep study in 2006 by Dr. Annamaria Boots    . Small bowel obstruction ([place]) 04/01/2012  . SOLITARY KIDNEY, CONGENITAL 09/05/2006   Qualifier: Diagnosis of  By: Larose Kells MD, [place]     Past Surgical History:  Procedure Laterality Date  . ABDOMINAL AORTIC ANEURYSM REPAIR  ~ 1996   w/ iliac aneurysm repair i  . APPENDECTOMY    . CARDIAC  CATHETERIZATION  1987   "before OHS"  . CARDIOVASCULAR STRESS TEST  10/13/2009   EF 57%  . CATARACT EXTRACTION W/ INTRAOCULAR LENS  IMPLANT, BILATERAL  09/1999,04/2003   right,left  . COLONOSCOPY    . CORONARY ARTERY BYPASS GRAFT  01/04/1986   CABG X5  . ESOPHAGOGASTRODUODENOSCOPY (EGD) WITH PROPOFOL N/A 02/22/2016   Procedure: ESOPHAGOGASTRODUODENOSCOPY (EGD) WITH PROPOFOL;  Surgeon: Milus Banister, MD;  Location: [facility];  Service: Endoscopy;  Laterality: N/A;  . INGUINAL HERNIA REPAIR Left 03/07/1983  . IR FLUORO GUIDE CV LINE RIGHT  09/27/2016  . IR US GUIDE VASC ACCESS RIGHT  09/27/2016  . JOINT REPLACEMENT    . Knuckles replaced  05/2005   left hand  . NEPHRECTOMY Left 1996  . SINUS ENDO W/FUSION Left 09/21/2016   Procedure: ENDOSCOPIC LEFT SINUS SURGERY WITH NAVIGATION;  Surgeon: Melissa Montane, MD;  Location: [facility];  Service: ENT;  Laterality: Left;  . TONSILLECTOMY    . TOTAL KNEE ARTHROPLASTY Left 05/04/1989  . US ECHOCARDIOGRAPHY  01/14/2007   EF 55-60%     Allergies  Allergen Reactions  . Colchicine     Wife cannot recall the reaction    Medications: Scheduled:   Abtx:  Anti-infectives    Start     Dose/Rate Route Frequency Ordered Stop   10/02/16 0800  voriconazole (VFEND) 350 mg in sodium chloride 0.9 % 100 mL IVPB     4 mg/kg  86.6 kg 67.5 mL/hr over 120 Minutes Intravenous Every 12 hours 10/01/16 1700     10/02/16 0800  anidulafungin (ERAXIS) 100 mg in sodium chloride 0.9 % 100 mL IVPB     100 mg 78 mL/hr over 100 Minutes Intravenous Every 24 hours 10/01/16 1700     10/01/16 1700  voriconazole (VFEND) 350 mg in sodium chloride 0.9 % 100 mL IVPB     4 mg/kg  86.6 kg 67.5 mL/hr over 120 Minutes Intravenous  Once 10/01/16 1624        Total days of antibiotics: 7-19 voriconazole     7-23 anidulafungin          Social History:  reports that he quit smoking about 44 years ago. His smoking use included Cigarettes. He has a 30.00 pack-year smoking history.  He has never used smokeless tobacco. He reports that he does not drink alcohol or use drugs.  Family History  Problem Relation Age of Onset  . Heart disease Father   . Lymphoma Sister   . Liver cancer Brother   . Lung cancer Brother   . Ulcerative colitis Brother   . Heart disease Brother   . Diabetes Brother   . Colon cancer Neg Hx   .  Prostate cancer Neg Hx   . Stomach cancer Neg Hx   . Esophageal cancer Neg Hx   . Rectal cancer Neg Hx     History obtained from spouse and child, chart review and unobtainable from patient due to mental status  Blood pressure 136/79, pulse 66, temperature 100 F (37.8 C), temperature source Temporal, resp. rate 16, height _0  (1.702 m), weight 86.6 kg (191 lb), SpO2 97 %. General appearance: fatigued, no distress, pale and slowed mentation Eyes: dysconjugate gaze, pupils unequal.  Throat: abnormal findings: dry Neck: no adenopathy, supple, symmetrical, trachea midline, thyroid not enlarged, symmetric, no tenderness/mass/nodules and moves easily, no rigidity.  Lungs: clear to auscultation bilaterally Heart: regular rate and rhythm Abdomen: normal findings: bowel sounds normal and soft, non-tender Extremities: edema anasarca BLE Neurologic: Mental status: alertness: lethargic   Results for orders placed or performed during the hospital encounter of 10/01/16 (from the past 48 hour(s))  Comprehensive metabolic panel     Status: Abnormal   Collection Time: 10/01/16  3:53 PM  Result Value Ref Range   Sodium 142 135 - 145 mmol/L   Potassium 3.7 3.5 - 5.1 mmol/L   Chloride 104 101 - 111 mmol/L   CO2 29 22 - 32 mmol/L   Glucose, Bld 197 (H) 65 - 99 mg/dL   BUN 55 (H) 6 - 20 mg/dL   Creatinine, Ser 1.73 (H) 0.61 - 1.24 mg/dL   Calcium 9.1 8.9 - 10.3 mg/dL   Total Protein 4.6 (L) 6.5 - 8.1 g/dL   Albumin 2.0 (L) 3.5 - 5.0 g/dL   AST 36 15 - 41 U/L   ALT 14 (L) 17 - 63 U/L   Alkaline Phosphatase 67 38 - 126 U/L   Total Bilirubin 0.9 0.3 - 1.2  mg/dL   GFR calc non Af Amer 34 (L) >60 mL/min   GFR calc Af Amer 39 (L) >60 mL/min    Comment: (NOTE) The eGFR has been calculated using the CKD EPI equation. This calculation has not been validated in all clinical situations. eGFR's persistently <60 mL/min signify possible Chronic Kidney Disease.    Anion gap 9 5 - 15  CBC WITH DIFFERENTIAL     Status: Abnormal   Collection Time: 10/01/16  3:53 PM  Result Value Ref Range   WBC 19.0 (H) 4.0 - 10.5 K/uL   RBC 2.87 (L) 4.22 - 5.81 MIL/uL   Hemoglobin 9.5 (L) 13.0 - 17.0 g/dL   HCT 28.0 (L) 39.0 - 52.0 %   MCV 97.6 78.0 - 100.0 fL   MCH 33.1 26.0 - 34.0 pg   MCHC 33.9 30.0 - 36.0 g/dL   RDW 19.0 (H) 11.5 - 15.5 %   Platelets 65 (L) 150 - 400 K/uL    Comment: SPECIMEN CHECKED FOR CLOTS REPEATED TO VERIFY PLATELET COUNT CONFIRMED BY SMEAR    Neutrophils Relative % 94 %   Neutro Abs 17.8 (H) 1.7 - 7.7 K/uL   Lymphocytes Relative 4 %   Lymphs Abs 0.7 0.7 - 4.0 K/uL   Monocytes Relative 3 %   Monocytes Absolute 0.5 0.1 - 1.0 K/uL   Eosinophils Relative 0 %   Eosinophils Absolute 0.0 0.0 - 0.7 K/uL   Basophils Relative 0 %   Basophils Absolute 0.0 0.0 - 0.1 K/uL  I-Stat CG4 Lactic Acid, ED  (not at  [facility])     Status: Abnormal   Collection Time: 10/01/16  4:10 PM  Result Value Ref Range   Lactic Acid,  Venous 2.12 (HH) 0.5 - 1.9 mmol/L   Comment NOTIFIED PHYSICIAN   Urinalysis, Routine w reflex microscopic     Status: Abnormal   Collection Time: 10/01/16  4:57 PM  Result Value Ref Range   Color, Urine YELLOW YELLOW   APPearance CLEAR CLEAR   Specific Gravity, Urine 1.016 1.005 - 1.030   pH 6.0 5.0 - 8.0   Glucose, UA 50 (A) NEGATIVE mg/dL   Hgb urine dipstick NEGATIVE NEGATIVE   Bilirubin Urine NEGATIVE NEGATIVE   Ketones, ur NEGATIVE NEGATIVE mg/dL   Protein, ur 100 (A) NEGATIVE mg/dL   Nitrite NEGATIVE NEGATIVE   Leukocytes, UA NEGATIVE NEGATIVE   RBC / HPF 0-5 0 - 5 RBC/hpf   WBC, UA 0-5 0 - 5 WBC/hpf   Bacteria, UA  NONE SEEN NONE SEEN   Squamous Epithelial / LPF NONE SEEN NONE SEEN   Mucous PRESENT    Hyaline Casts, UA PRESENT       Component Value Date/Time   SDES ABSCESS 09/21/2016 1425   SDES ABSCESS 09/21/2016 1425   SPECREQUEST  09/21/2016 1425    SIN CONTENTS ROCEPHIN,NISTATIN,AMPB,VANC,FLAGYL,BACTROBAN   SPECREQUEST  09/21/2016 1425    SINUS CONTENTS PT ON ROCEPHIN,NISTATIN,AMPB,VANC,FLAGYL,BACTROBAN   CULT NO ANAEROBES ISOLATED 09/21/2016 1425   CULT NO GROWTH 3 DAYS 09/21/2016 1425   REPTSTATUS 09/26/2016 FINAL 09/21/2016 1425   REPTSTATUS 09/24/2016 FINAL 09/21/2016 1425   Ct Head Wo Contrast  Result Date: 10/01/2016 CLINICAL DATA:  Sepsis. Fungal brain abscess. Ongoing decline in mental status, functional decline, febrile earlier today. EXAM: CT HEAD WITHOUT CONTRAST TECHNIQUE: Contiguous axial images were obtained from the base of the skull through the vertex without intravenous contrast. COMPARISON:  CT head 09/18/2016.  MR head 09/18/2016. FINDINGS: Brain: Global atrophy. Hydrocephalus ex vacuo. No brain hemorrhage or extra-axial mass. There is vasogenic edema affecting the LEFT frontal region, LEFT basal ganglia, extending into the posterior LEFT temporal periatrial region. These areas were all involved with low-level ring-like enhancement representing multifocal brain abscesses as seen on most recent prior MR. Overall the mass effect and edema has regressed slightly in the frontal region since prior CT. The edema and mass effect in the LEFT temporal region appears worse. Vascular: No hyperdense vessel or unexpected calcification. Skull: Normal. Negative for fracture or focal lesion. Sinuses/Orbits: Chronic mucosal thickening and reactive osteitis in the LEFT maxillary sinus. No layering fluid. BILATERAL cataract extraction. Other: None. IMPRESSION: Slight improvement in vasogenic edema affecting the LEFT frontal and LEFT basal ganglia, areas involved with multifocal fungal brain abscesses,  as seen on most recent prior pre and postcontrast brain MR. There is slight worsening of edema in the LEFT temporal white matter. If no contraindications, repeat of that study could provide additional information. Electronically Signed   By: Staci Righter M.D.   On: 10/01/2016 18:23   Dg Chest Port 1 View  Result Date: 10/01/2016 CLINICAL DATA:  Sepsis. EXAM: PORTABLE CHEST 1 VIEW COMPARISON:  Radiograph of September 17, 2016. FINDINGS: Stable cardiomegaly. Atherosclerosis of thoracic aorta is noted. Sternotomy wires are noted. No acute pulmonary disease is noted. Interval placement of right-sided PICC line with distal tip in expected position of cavoatrial junction. No pneumothorax or pleural effusion is noted. Bony thorax is unremarkable. IMPRESSION: Aortic atherosclerosis. No acute cardiopulmonary abnormality seen. Interval placement of right-sided PICC line with distal tip in expected position of cavoatrial junction. Electronically Signed   By: Marijo Conception, M.D.   On: 10/01/2016 16:19   No results found for  this or any previous visit (from the past 240 hour(s)).    10/01/2016, 6:36 PM     LOS: 0 days    Records and images were personally reviewed where available.  Derrick Rumpf, MD [facility] [phone] 10/01/2016, 6:36 PM

## 2016-10-01 NOTE — Progress Notes (Signed)
Pharmacy Communication Note:  Discussed patient with ID who asked to resume Eraxis 100 mg IV daily and voriconazole 4 mg/kg IV twice daily. On talking to the patient's wife, she reported that the patient received their dose of IV Eraxis today but the last dose of voriconazole was yesterday.   Plan: -Resume voriconazole 4 mg/kg IV Q 12 hours to start tonight -Resume Eraxis 100 mg IV Q 24 hours to start tomorrow -Consider a voriconazole level while admitted  Albertina Parr, PharmD., [place] Pharmacist Pager [phone]

## 2016-10-01 NOTE — ED Notes (Signed)
Dr. Gardner at bedside 

## 2016-10-01 NOTE — ED Notes (Signed)
Family members at bedside to speak with MD regarding plan of care. Dr.Gardner made aware

## 2016-10-01 NOTE — ED Notes (Signed)
QNS and unsuccessful blood draw.  Unable to draw blood for 2nd set blood cultures and istat lactic acid.

## 2016-10-01 NOTE — ED Provider Notes (Signed)
[place] DEPT Provider Note   CSN: [id number] Arrival date & time: 10/01/16  1537     History   Chief Complaint Chief Complaint  Patient presents with  . Fever    HPI Derrick Fry is a 83 y.o. male.  HPI  83 y.o. male with a hx of Aortic Dissection, DM, HTN, HLD, Brain Abscess presents to the Emergency Department today due to decreased mental status as well as decrease in PO while at SNF. Recent DC from admission on 09-28-16. Found to be febrile and sent to ED at [facility]. Pt with significant hx of brain abscess during previous admission. Pt not surgical candidate per neurosurgery. Culture was positive for Aspergillus and infectious disease was consulted. Started on  IV anidulafingin and on oral voriconazole. Limited HPI due to AMS. PT IS DO NOT RESUSCITATE.   Level V Caveat: AMS  Past Medical History:  Diagnosis Date  .  peripheral neuropathy --UDS--pain mngmt  09/05/2006   Qualifier: Diagnosis of  By: Larose Kells MD, [place] AAA (abdominal aortic aneurysm) ([place])   . Anemia   . Aortic dissection ([place]) 09/03/2016  . Arrhythmia 02/09/2016  . Arthritis    "all over" (02/10/2016)  . Basal cell carcinoma of face    "burned off" (02/10/2016)  . CAD (coronary artery disease)    MI [birth year]  . Chronic lower back pain   . Decreased cardiac ejection fraction 09/04/2016   EF 35%  . Diabetes mellitus with neuropathy ([place])    "borderline" (02/10/2016)  . DM II (diabetes mellitus, type II), controlled ([place]) 09/05/2006   Qualifier: Diagnosis of  By: Larose Kells MD, [place] DVT (deep venous thrombosis) ([place]) 02/2016   "left thigh"  . Essential hypertension 09/05/2006   Qualifier: Diagnosis of  By: Larose Kells MD, [place] Gait abnormality    chronic imbalance  . GAIT DISTURBANCE 08/22/2007   Qualifier: Diagnosis of  By: Larose Kells MD, [place] Gout    diskitis 10/2008, Dr Ouida Sills  . Gout 08/22/2007   Gout diagnosed 10-2008. he had a  L4 L5-S1 gout diskitis DEXA neg 3-09 and 08-2009   . Heart murmur dx'd  02/2016  . High cholesterol 03/23/2009   Qualifier: Diagnosis of  By: Larose Kells MD, [place] History of DVT (deep vein thrombosis) 02/09/2016  . Hyperlipidemia   . Hypertension   . Myocardial infarction ([place]) 1987   "before OHS"  . OSA (obstructive sleep apnea)    Limited CPAP tolerance (02/10/2016)  . Osteoarthritis   . Pneumonia 1938   had right pneumonia pleurisy requiring resection of ribs and chest tube drainage at age 26  . Pulmonary embolism ([place]) 02/2016   "left"  . Pulmonary fibrosis ([place]) 01/29/2013  . Renal insufficiency    chronic w/ solitary kidney, congenital  . RENAL INSUFFICIENCY, CHRONIC 09/05/2006   Qualifier: Diagnosis of  By: Larose Kells MD, [place] SLEEP APNEA 09/05/2006   History of a sleep study in 2006 by Dr. Annamaria Boots    . Small bowel obstruction ([place]) 04/01/2012  . SOLITARY KIDNEY, CONGENITAL 09/05/2006   Qualifier: Diagnosis of  By: Larose Kells MD, [place]     Patient Active Problem List   Diagnosis Date Noted  . Acute metabolic encephalopathy 08/65/7846  . Brain abscess   . Fungus ball   . Chronic systolic CHF (congestive heart failure) ([place]) 09/17/2016  . Cardiomyopathy ([place]) 09/13/2016  . Hyperkalemia 09/13/2016  .  Acute kidney injury ([place]) 09/13/2016  . Decreased cardiac ejection fraction   . Aortic dissection ([place]) 09/03/2016  . Acute bilateral thoracic back pain   . Abdominal pain, chronic, epigastric   . Gastritis and gastroduodenitis   . Diarrhea   . Pulmonary embolus ([place])   . Pulmonary embolism ([place]) 02/09/2016  . History of DVT (deep vein thrombosis) 02/09/2016  . Nausea, vomiting and diarrhea 02/09/2016  . Failure to thrive in adult 02/09/2016  . Hyponatremia 02/09/2016  . Loss of weight 02/09/2016  . Protein calorie malnutrition ([place]) 02/09/2016  . Arrhythmia 02/09/2016  . PCP NOTES >>>>> 11/20/2014  . Buzzing in ear 06/04/2013  . Pulmonary fibrosis ([place]) 01/29/2013  . Normocytic anemia 04/01/2012  . Annual physical exam 07/11/2010  . AAA  (abdominal aortic aneurysm) ([place]) 07/11/2010  . Hyperlipidemia 03/23/2009  . Gout 08/22/2007  . GAIT DISTURBANCE 08/22/2007  . Osteoarthritis  04/23/2007  . OSA (obstructive sleep apnea) 01/03/2007  . DM II (diabetes mellitus, type II), controlled ([place]) 09/05/2006  .  peripheral neuropathy --UDS--pain mngmt  09/05/2006  . Hypertensive heart disease with congestive heart failure ([place]) 09/05/2006  . CAD (coronary artery disease) 09/05/2006  . CKD (chronic kidney disease), stage III 09/05/2006  . SOLITARY KIDNEY, CONGENITAL 09/05/2006    Past Surgical History:  Procedure Laterality Date  . ABDOMINAL AORTIC ANEURYSM REPAIR  ~ 1996   w/ iliac aneurysm repair i  . APPENDECTOMY    . CARDIAC CATHETERIZATION  1987   "before OHS"  . CARDIOVASCULAR STRESS TEST  10/13/2009   EF 57%  . CATARACT EXTRACTION W/ INTRAOCULAR LENS  IMPLANT, BILATERAL  09/1999,04/2003   right,left  . COLONOSCOPY    . CORONARY ARTERY BYPASS GRAFT  01/04/1986   CABG X5  . ESOPHAGOGASTRODUODENOSCOPY (EGD) WITH PROPOFOL N/A 02/22/2016   Procedure: ESOPHAGOGASTRODUODENOSCOPY (EGD) WITH PROPOFOL;  Surgeon: Milus Banister, MD;  Location: [facility];  Service: Endoscopy;  Laterality: N/A;  . INGUINAL HERNIA REPAIR Left 03/07/1983  . IR FLUORO GUIDE CV LINE RIGHT  09/27/2016  . IR US GUIDE VASC ACCESS RIGHT  09/27/2016  . JOINT REPLACEMENT    . Knuckles replaced  05/2005   left hand  . NEPHRECTOMY Left 1996  . SINUS ENDO W/FUSION Left 09/21/2016   Procedure: ENDOSCOPIC LEFT SINUS SURGERY WITH NAVIGATION;  Surgeon: Melissa Montane, MD;  Location: [facility];  Service: ENT;  Laterality: Left;  . TONSILLECTOMY    . TOTAL KNEE ARTHROPLASTY Left 05/04/1989  . US ECHOCARDIOGRAPHY  01/14/2007   EF 55-60%       Home Medications    Prior to Admission medications   Medication Sig Start Date End Date Taking? Authorizing Provider  anidulafungin (ERAXIS) IVPB Inject 100 mg into the vein daily. Indication:  CNS fungal infection Last  Day of Therapy:  12/27/16 Labs - Once weekly:  CBC/D and BMP, Labs - Every other week:  ESR and CRP 09/28/16 12/27/16  Bonnielee Haff, MD  atorvastatin (LIPITOR) 80 MG tablet Take 1 tablet (80 mg total) by mouth daily. 08/25/16   Colon Branch, MD  cyanocobalamin 100 MCG tablet Take 100 mcg by mouth daily.     [provider]  dexamethasone (DECADRON) 4 MG tablet 9m twice daily for 1 week and then 2 mg twice daily till follow up with Infectious Diseases. 09/28/16   KBonnielee Haff MD  febuxostat (ULORIC) 40 MG tablet Take 40 mg by mouth daily.     [provider]  feeding supplement, [place] (GEnsign  LIQD Take 237 mLs by mouth 3 (three) times daily between meals. 09/28/16   Bonnielee Haff, MD  folic acid (FOLVITE) 1 MG tablet Take 1 mg by mouth daily.    [provider]  furosemide (LASIX) 20 MG tablet Take 1 tablet (20 mg total) by mouth every other day. 09/29/16   Bonnielee Haff, MD  hydrALAZINE (APRESOLINE) 100 MG tablet Take 100 mg by mouth every 8 (eight) hours.    [provider]  insulin aspart (NOVOLOG) 100 UNIT/ML injection Inject 3 Units into the skin 3 (three) times daily with meals. 09/28/16   Bonnielee Haff, MD  insulin aspart (NOVOLOG) 100 UNIT/ML injection Use following sliding scale three times a day. Check CBG before each meal.  For CBG less than 120: No insulin CBG: 121-150: 2 units SQ CBG: 151-200: 3 Units SQ CBG: 201-250: 5 Units SQ CBG: 251-300: 7 units and call MD for further instructions 09/28/16   Bonnielee Haff, MD  ipratropium-albuterol (DUONEB) 0.5-2.5 (3) MG/3ML SOLN Take 3 mLs by nebulization every 6 (six) hours as needed (for wheezing/shortness of breath).    [provider]  metoprolol tartrate (LOPRESSOR) 50 MG tablet Take 1 tablet (50 mg total) by mouth 2 (two) times daily. 09/11/16   Regalado, Belkys A, MD  Multiple Vitamins-Minerals (CENTRUM SILVER) tablet Take 1 tablet by mouth daily.     [provider]  nitroGLYCERIN (NITROSTAT) 0.4 MG SL tablet Place 1 tablet (0.4 mg total) under the tongue every 5 (five) minutes as needed for chest pain. 08/05/15   Nahser, Wonda Cheng, MD  omega-3 acid ethyl esters (LOVAZA) 1 g capsule Take 2 g by mouth daily.    [provider]  omeprazole (PRILOSEC) 40 MG capsule Take 1 capsule (40 mg total) by mouth daily. Patient taking differently: Take 40 mg by mouth daily before breakfast.  02/15/16   Milus Banister, MD  oxymetazoline (AFRIN) 0.05 % nasal spray Place 1 spray into both nostrils once as needed for congestion (Bedside procedure). 09/28/16   Bonnielee Haff, MD  polyethylene glycol [facility] Floria Raveling) packet Take 17 g by mouth daily as needed for mild constipation or moderate constipation.     [provider]  saccharomyces boulardii (FLORASTOR) 250 MG capsule Take 250 mg by mouth daily.    [provider]  voriconazole (VFEND) 50 MG tablet Take 7 tablets (350 mg total) by mouth 2 (two) times daily. Indication:  CNS fungal infection Last Day of Therapy:  12/27/16 Labs - Once weekly:  CBC/D and BMP Labs - Every other week:  ESR and CRP' Labs- Every 5 days: Voriconazole level 09/28/16 12/27/16  Bonnielee Haff, MD    Family History Family History  Problem Relation Age of Onset  . Heart disease Father   . Lymphoma Sister   . Liver cancer Brother   . Lung cancer Brother   . Ulcerative colitis Brother   . Heart disease Brother   . Diabetes Brother   . Colon cancer Neg Hx   . Prostate cancer Neg Hx   . Stomach cancer Neg Hx   . Esophageal cancer Neg Hx   . Rectal cancer Neg Hx     Social History Social History  Substance Use Topics  . Smoking status: Former Smoker    Packs/day: 1.00    Years: 30.00    Types: Cigarettes    Quit date: 04/02/1972  . Smokeless tobacco: Never Used  . Alcohol use No     Comment: former  heavy alcohol use     Allergies   Colchicine   Review of Systems Review of Systems    Unable to perform ROS: Mental status change   Physical Exam Updated Vital Signs BP 136/79   Pulse 66   Temp 100 F (37.8 C) (Temporal)   Resp 16   Ht _0  (1.702 m)   Wt 86.6 kg (191 lb)   SpO2 97%   BMI 29.91 kg/m   Physical Exam  Constitutional: He is oriented to person, place, and time. He appears well-developed and well-nourished. No distress.  HENT:  Head: Normocephalic and atraumatic.  Right Ear: Tympanic membrane, external ear and ear canal normal.  Left Ear: Tympanic membrane, external ear and ear canal normal.  Nose: Nose normal.  Mouth/Throat: Uvula is midline, oropharynx is clear and moist and mucous membranes are normal. No trismus in the jaw. No oropharyngeal exudate, posterior oropharyngeal erythema or tonsillar abscesses.  Eyes: Pupils are equal, round, and reactive to light. EOM are normal.  Neck: Normal range of motion. Neck supple. No tracheal deviation present.  Cardiovascular: Regular rhythm, S1 normal, S2 normal, normal heart sounds, intact distal pulses and normal pulses.  Tachycardia present.   Pulmonary/Chest: Effort normal and breath sounds normal. No respiratory distress. He has no decreased breath sounds. He has no wheezes. He has no rhonchi. He has no rales.  Abdominal: Normal appearance and bowel sounds are normal. There is no tenderness.  Musculoskeletal: Normal range of motion.  BLE +3 pitting edema. NVI. Distal pulses appreciated   Neurological: He is alert and oriented to person, place, and time. GCS eye subscore is 4. GCS verbal subscore is 4. GCS motor subscore is 5.  Skin: Skin is warm and dry.  Nursing note and vitals reviewed.  ED Treatments / Results  Labs (all labs ordered are listed, but only abnormal results are displayed) Labs Reviewed  COMPREHENSIVE METABOLIC PANEL - Abnormal; Notable for the following:       Result Value   Glucose, Bld 197 (*)    BUN 55 (*)    Creatinine, Ser 1.73 (*)    Total Protein 4.6 (*)    Albumin 2.0  (*)    ALT 14 (*)    GFR calc non Af Amer 34 (*)    GFR calc Af Amer 39 (*)    All other components within normal limits  CBC WITH DIFFERENTIAL/PLATELET - Abnormal; Notable for the following:    WBC 19.0 (*)    RBC 2.87 (*)    Hemoglobin 9.5 (*)    HCT 28.0 (*)    RDW 19.0 (*)    Platelets 65 (*)    Neutro Abs 17.8 (*)    All other components within normal limits  URINALYSIS, ROUTINE W REFLEX MICROSCOPIC - Abnormal; Notable for the following:    Glucose, UA 50 (*)    Protein, ur 100 (*)    All other components within normal limits  I-STAT CG4 LACTIC ACID, ED - Abnormal; Notable for the following:    Lactic Acid, Venous 2.12 (*)    All other components within normal limits  CULTURE, BLOOD (ROUTINE X 2)  CULTURE, BLOOD (ROUTINE X 2)  BRAIN NATRIURETIC PEPTIDE  I-STAT TROPONIN, ED  I-STAT CG4 LACTIC ACID, ED    EKG  EKG Interpretation  Date/Time:  Sunday October 01 2016 15:42:29 EDT Ventricular Rate:  125 PR Interval:    QRS Duration: 106 QT Interval:  315 QTC Calculation: 381 R Axis:   -  51 Text Interpretation:  Sinus tachycardia Ventricular bigeminy Left anterior fascicular block Abnormal R-wave progression, early transition LVH with secondary repolarization abnormality Confirmed by Merrily Pew [phone]) on 10/01/2016 6:05:22 PM       Radiology Ct Head Wo Contrast  Result Date: 10/01/2016 CLINICAL DATA:  Sepsis. Fungal brain abscess. Ongoing decline in mental status, functional decline, febrile earlier today. EXAM: CT HEAD WITHOUT CONTRAST TECHNIQUE: Contiguous axial images were obtained from the base of the skull through the vertex without intravenous contrast. COMPARISON:  CT head 09/18/2016.  MR head 09/18/2016. FINDINGS: Brain: Global atrophy. Hydrocephalus ex vacuo. No brain hemorrhage or extra-axial mass. There is vasogenic edema affecting the LEFT frontal region, LEFT basal ganglia, extending into the posterior LEFT temporal periatrial region. These areas were all  involved with low-level ring-like enhancement representing multifocal brain abscesses as seen on most recent prior MR. Overall the mass effect and edema has regressed slightly in the frontal region since prior CT. The edema and mass effect in the LEFT temporal region appears worse. Vascular: No hyperdense vessel or unexpected calcification. Skull: Normal. Negative for fracture or focal lesion. Sinuses/Orbits: Chronic mucosal thickening and reactive osteitis in the LEFT maxillary sinus. No layering fluid. BILATERAL cataract extraction. Other: None. IMPRESSION: Slight improvement in vasogenic edema affecting the LEFT frontal and LEFT basal ganglia, areas involved with multifocal fungal brain abscesses, as seen on most recent prior pre and postcontrast brain MR. There is slight worsening of edema in the LEFT temporal white matter. If no contraindications, repeat of that study could provide additional information. Electronically Signed   By: Staci Righter M.D.   On: 10/01/2016 18:23   Dg Chest Port 1 View  Result Date: 10/01/2016 CLINICAL DATA:  Sepsis. EXAM: PORTABLE CHEST 1 VIEW COMPARISON:  Radiograph of September 17, 2016. FINDINGS: Stable cardiomegaly. Atherosclerosis of thoracic aorta is noted. Sternotomy wires are noted. No acute pulmonary disease is noted. Interval placement of right-sided PICC line with distal tip in expected position of cavoatrial junction. No pneumothorax or pleural effusion is noted. Bony thorax is unremarkable. IMPRESSION: Aortic atherosclerosis. No acute cardiopulmonary abnormality seen. Interval placement of right-sided PICC line with distal tip in expected position of cavoatrial junction. Electronically Signed   By: Marijo Conception, M.D.   On: 10/01/2016 16:19    Procedures Procedures (including critical care time)  Medications Ordered in ED Medications  voriconazole (VFEND) 350 mg in sodium chloride 0.9 % 100 mL IVPB (350 mg Intravenous New Bag/Given 10/01/16 1731)  voriconazole  (VFEND) 350 mg in sodium chloride 0.9 % 100 mL IVPB (not administered)  anidulafungin (ERAXIS) 100 mg in sodium chloride 0.9 % 100 mL IVPB (not administered)  sodium chloride 0.9 % bolus 500 mL (500 mLs Intravenous New Bag/Given 10/01/16 1705)     Initial Impression / Assessment and Plan / ED Course  I have reviewed the triage vital signs and the nursing notes.  Pertinent labs & imaging results that were available during my care of the patient were reviewed by me and considered in my medical decision making (see chart for details).  Final Clinical Impressions(s) / ED Diagnoses  {I have reviewed and evaluated the relevant laboratory values. {I have reviewed and evaluated the relevant imaging studies. {I have interpreted the relevant EKG. {I have reviewed the relevant previous healthcare records. {I have reviewed EMS Documentation. {I obtained HPI from historian. {Patient discussed with supervising physician.  ED Course:  Assessment: Pt is a 83 y.o. male with a hx of Aortic Dissection, DM,  HTN, HLD, presents to the Emergency Department today due to decreased mental status as well as decrease in PO while at SNF. Found to be febrile and sent to ED. Pt with significant hx of brain abscess during previous admission. Pt not surgical candidate per neurosurgery. Culture was positive for Aspergillus and infectious disease was consulted. Started on  IV anidulafingin and on oral voriconazole. Limited HPI due to AMS.. On exam, pt in with decreased responsiveness. Septic appearing. VSS. Normotensive. Febrile 100F. Lungs CTA. Heart RRR. Abdomen nontender soft. EKG with ventricular bigeminy. Lactic 2.12. CBC WBC 19. Blood cultures drawn. Code spesis initiated. Will withold fluids as pt with significant CHF and pitting edema +3 with EF 35-40% most recent admission. Given small 511m NS bolus. Given IV antifungals  Voriconazole and Andidulafungin per pharmacy consult. CXR unremarkable. CT Head pending. Consul tto  Palliative Care. Appreciate consult. Plan is to [facility] [place]    Disposition/Plan:  Admit Pt acknowledges and agrees with plan  Supervising Physician Mesner, JCorene Cornea MD  Final diagnoses:  Brain abscess  Altered mental status, unspecified altered mental status type    New Prescriptions New Prescriptions   No medications on file     MShary Decamp PHershal Coria07/29/18 1853    Mesner, JCorene Cornea MD 10/03/16 1555

## 2016-10-01 NOTE — Progress Notes (Signed)
PMT no charge initial note..  Derrick Fry is an 83 yo gentleman with DM, OSA, CAD, III CKD, recently had aortic dissection that was medically managed, then was again admitted with fungal brain abscess, sinusitis.   Patient was d/c to [facility] with PICC, IV Anti fungals for rehab, how ever, he has had ongoing decline in mental status, functional decline, not eating much, was febrile earlier today, hence, brought back to ED.   Patient is undergoing sepsis workup, currently he is in CT for CT brain, blood work and additional work up is under way.   A palliative consult has been requested for early goals of care conversations and for supporting the wife and family.   I met with patient's wife, grand daughter and son in law. I introduced myself and palliative care as follows: Palliative medicine is specialized medical care for people living with serious illness. It focuses on providing relief from the symptoms and stress of a serious illness. The goal is to improve quality of life for both the patient and the family.  PLAN: 1. DNR DNI 2. To be admitted to hospitalist medicine, undergo appropriate sepsis management.  3. Palliative team to follow course, monitor trajectory and help guide appropriate decision making. Discussed frankly but compassionately with wife about comfort measures/hospice should the patient have ongoing decline in this hospitalization.   Thank you for the consult, full note and additional recommendations to follow.   Loistine Chance MD [facility] health palliative medicine team [phone]

## 2016-10-01 NOTE — H&P (Signed)
History and Physical    LUNDEN MCLEISH GDJ:[id number] DOB: [birth year] DOA: 10/01/2016  PCP: Colon Branch, MD  Patient coming from: SNF  I have personally briefly reviewed patient's old medical records in [place]  Chief Complaint: AMS  HPI: Derrick Fry is a 83 y.o. male with medical history significant of Aortic dissection earlier this month, DM, HTN, HLD.  Patient was admitted on 7/15-7/26 for brain abscesses and fungal sinusitis with aspergillus.  During that admission he was treated with broad spectrum antibiotics and antifungals and nasal surgery performed, biopsy showed aspergillus.  Patient not a surgical candidate per neuro surgery.  No BCx, and cultures of brain never obtained.  He was discharged on antifungals to the SNF.  At SNF he took a small amount PO but stopped taking anything PO over the past 24 hours and had temp up to 101 today.  Has had essentially minimal interaction with family and staff over past 24 hours.  Was brought back to the ED.   ED Course: Patient with Tm 100, WBC up to 19k from 13.9k on discharge.  Creat 1.7 and BUN 55 also up from discharge.  Platelets down to 65 from 80s on discharge.  HR 120s-160s.  He is in A.Fib with multifocal PVCs and even runs of V.Tach on monitor.   Review of Systems: As per HPI otherwise 10 point review of systems negative.   Past Medical History:  Diagnosis Date  .  peripheral neuropathy --UDS--pain mngmt  09/05/2006   Qualifier: Diagnosis of  By: Larose Kells MD, [place] AAA (abdominal aortic aneurysm) ([place])   . Anemia   . Aortic dissection ([place]) 09/03/2016  . Arrhythmia 02/09/2016  . Arthritis    "all over" (02/10/2016)  . Basal cell carcinoma of face    "burned off" (02/10/2016)  . CAD (coronary artery disease)    MI 08[birth year]  . Chronic lower back pain   . Decreased cardiac ejection fraction 09/04/2016   EF 35%  . Diabetes mellitus with neuropathy ([place])    "borderline" (02/10/2016)  . DM II (diabetes mellitus, type II),  controlled ([place]) 09/05/2006   Qualifier: Diagnosis of  By: Larose Kells MD, [place] DVT (deep venous thrombosis) ([place]) 02/2016   "left thigh"  . Essential hypertension 09/05/2006   Qualifier: Diagnosis of  By: Larose Kells MD, [place] Gait abnormality    chronic imbalance  . GAIT DISTURBANCE 08/22/2007   Qualifier: Diagnosis of  By: Larose Kells MD, [place] Gout    diskitis 10/2008, Dr Ouida Sills  . Gout 08/22/2007   Gout diagnosed 10-2008. he had a  L4 L5-S1 gout diskitis DEXA neg 3-09 and 08-2009   . Heart murmur dx'd 02/2016  . High cholesterol 03/23/2009   Qualifier: Diagnosis of  By: Larose Kells MD, [place] History of DVT (deep vein thrombosis) 02/09/2016  . Hyperlipidemia   . Hypertension   . Myocardial infarction ([place]) 1987   "before OHS"  . OSA (obstructive sleep apnea)    Limited CPAP tolerance (02/10/2016)  . Osteoarthritis   . Pneumonia 1938   had right pneumonia pleurisy requiring resection of ribs and chest tube drainage at age 15  . Pulmonary embolism ([place]) 02/2016   "left"  . Pulmonary fibrosis ([place]) 01/29/2013  . Renal insufficiency    chronic w/ solitary kidney, congenital  . RENAL INSUFFICIENCY, CHRONIC 09/05/2006   Qualifier: Diagnosis of  By: Larose Kells MD, [facility]  [place] 09/05/2006   History of a sleep study in 2006 by Dr. Annamaria Boots    . Small bowel obstruction ([place]) 04/01/2012  . SOLITARY KIDNEY, CONGENITAL 09/05/2006   Qualifier: Diagnosis of  By: Larose Kells MD, [place]     Past Surgical History:  Procedure Laterality Date  . ABDOMINAL AORTIC ANEURYSM REPAIR  ~ 1996   w/ iliac aneurysm repair i  . APPENDECTOMY    . CARDIAC CATHETERIZATION  1987   "before OHS"  . CARDIOVASCULAR STRESS TEST  10/13/2009   EF 57%  . CATARACT EXTRACTION W/ INTRAOCULAR LENS  IMPLANT, BILATERAL  09/1999,04/2003   right,left  . COLONOSCOPY    . CORONARY ARTERY BYPASS GRAFT  01/04/1986   CABG X5  . ESOPHAGOGASTRODUODENOSCOPY (EGD) WITH PROPOFOL N/A 02/22/2016   Procedure: ESOPHAGOGASTRODUODENOSCOPY (EGD) WITH  PROPOFOL;  Surgeon: Milus Banister, MD;  Location: [facility];  Service: Endoscopy;  Laterality: N/A;  . INGUINAL HERNIA REPAIR Left 03/07/1983  . IR FLUORO GUIDE CV LINE RIGHT  09/27/2016  . IR US GUIDE VASC ACCESS RIGHT  09/27/2016  . JOINT REPLACEMENT    . Knuckles replaced  05/2005   left hand  . NEPHRECTOMY Left 1996  . SINUS ENDO W/FUSION Left 09/21/2016   Procedure: ENDOSCOPIC LEFT SINUS SURGERY WITH NAVIGATION;  Surgeon: Melissa Montane, MD;  Location: [facility];  Service: ENT;  Laterality: Left;  . TONSILLECTOMY    . TOTAL KNEE ARTHROPLASTY Left 05/04/1989  . US ECHOCARDIOGRAPHY  01/14/2007   EF 55-60%     reports that he quit smoking about 44 years ago. His smoking use included Cigarettes. He has a 30.00 pack-year smoking history. He has never used smokeless tobacco. He reports that he does not drink alcohol or use drugs.  Allergies  Allergen Reactions  . Colchicine     Wife cannot recall the reaction    Family History  Problem Relation Age of Onset  . Heart disease Father   . Lymphoma Sister   . Liver cancer Brother   . Lung cancer Brother   . Ulcerative colitis Brother   . Heart disease Brother   . Diabetes Brother   . Colon cancer Neg Hx   . Prostate cancer Neg Hx   . Stomach cancer Neg Hx   . Esophageal cancer Neg Hx   . Rectal cancer Neg Hx      Prior to Admission medications   Medication Sig Start Date End Date Taking? Authorizing Provider  anidulafungin (ERAXIS) IVPB Inject 100 mg into the vein daily. Indication:  CNS fungal infection Last Day of Therapy:  12/27/16 Labs - Once weekly:  CBC/D and BMP, Labs - Every other week:  ESR and CRP 09/28/16 12/27/16 Yes Bonnielee Haff, MD  atorvastatin (LIPITOR) 80 MG tablet Take 1 tablet (80 mg total) by mouth daily. 08/25/16  Yes Paz, Alda Berthold, MD  cyanocobalamin 100 MCG tablet Take 100 mcg by mouth daily.    Yes [provider]  dexamethasone (DECADRON) 4 MG tablet 74m twice daily for 1 week and then 2 mg twice  daily till follow up with Infectious Diseases. Patient taking differently: Take 2-4 mg by mouth See admin instructions. 4 mg two times a day for 1 week then 2 mg two times a day UNTIL FOLLOW UP WITH INFECTIOUS DISEASE FOR BRAIN EDEMA 09/28/16  Yes KBonnielee Haff MD  febuxostat (ULORIC) 40 MG tablet Take 40 mg by mouth daily.    Yes [provider]  feeding supplement, GLUCERNA SHAKE, (GLUCERNA SHAKE) LIQD Take 237 mLs by mouth 3 (three) times daily between meals. 09/28/16  Yes Bonnielee Haff, MD  folic acid (FOLVITE) 1 MG tablet Take 1 mg by mouth daily.   Yes [provider]  furosemide (LASIX) 20 MG tablet Take 1 tablet (20 mg total) by mouth every other day. 09/29/16  Yes Bonnielee Haff, MD  hydrALAZINE (APRESOLINE) 100 MG tablet Take 100 mg by mouth every 8 (eight) hours.   Yes [provider]  insulin aspart (NOVOLOG) 100 UNIT/ML injection Use following sliding scale three times a day. Check CBG before each meal.  For CBG less than 120: No insulin CBG: 121-150: 2 units SQ CBG: 151-200: 3 Units SQ CBG: 201-250: 5 Units SQ CBG: 251-300: 7 units and call MD for further instructions Patient taking differently: Inject 0-10 Units into the skin 3 (three) times daily with meals. Per sliding scale: BGL <200 = 0 units; 200-250 = 2 units; 257-300 = 4 units; 301-350 = 6 units; 357-400 = 8 units; >400 = 10 units, then re-check BGL in one hour 09/28/16  Yes Bonnielee Haff, MD  ipratropium-albuterol (DUONEB) 0.5-2.5 (3) MG/3ML SOLN Take 3 mLs by nebulization every 6 (six) hours as needed (for wheezing/shortness of breath).   Yes [provider]  metoprolol tartrate (LOPRESSOR) 50 MG tablet Take 1 tablet (50 mg total) by mouth 2 (two) times daily. 09/11/16  Yes Regalado, Belkys A, MD  Multiple Vitamins-Minerals (CENTRUM SILVER) tablet Take 1 tablet by mouth daily.    Yes [provider]  nitroGLYCERIN (NITROSTAT) 0.4 MG SL tablet Place 1 tablet (0.4 mg total) under  the tongue every 5 (five) minutes as needed for chest pain. 08/05/15  Yes Nahser, Wonda Cheng, MD  omega-3 acid ethyl esters (LOVAZA) 1 g capsule Take 2 g by mouth daily.   Yes [provider]  omeprazole (PRILOSEC) 40 MG capsule Take 1 capsule (40 mg total) by mouth daily. 02/15/16  Yes Milus Banister, MD  polyethylene glycol [facility] Floria Raveling) packet Take 17 g by mouth daily as needed for mild constipation or moderate constipation.    Yes [provider]  saccharomyces boulardii (FLORASTOR) 250 MG capsule Take 250 mg by mouth daily.   Yes [provider]  voriconazole (VFEND) 50 MG tablet Take 7 tablets (350 mg total) by mouth 2 (two) times daily. Indication:  CNS fungal infection Last Day of Therapy:  12/27/16 Labs - Once weekly:  CBC/D and BMP Labs - Every other week:  ESR and CRP' Labs- Every 5 days: Voriconazole level 09/28/16 12/27/16 Yes Bonnielee Haff, MD  insulin aspart (NOVOLOG) 100 UNIT/ML injection Inject 3 Units into the skin 3 (three) times daily with meals. Patient not taking: Reported on 10/01/2016 09/28/16   Bonnielee Haff, MD  oxymetazoline (AFRIN) 0.05 % nasal spray Place 1 spray into both nostrils once as needed for congestion (Bedside procedure). Patient not taking: Reported on 10/01/2016 09/28/16   Bonnielee Haff, MD    Physical Exam: Vitals:   10/01/16 2045 10/01/16 2100 10/01/16 2130 10/01/16 2150  BP: 140/75 135/69 137/64   Pulse: (!) 101 (!) 123 (!) 126   Resp: _0 Temp:    99.2 F (37.3 C)  TempSrc:    Axillary  SpO2: 98% 96%    Weight:      Height:        Constitutional: Obtunded, opens eyes to voice but does not vocalize Eyes: PERRL, lids and conjunctivae  normal ENMT: Mucous membranes are moist. Posterior pharynx clear of any exudate or lesions.Normal dentition.  Neck: normal, supple, no masses, no thyromegaly Respiratory: clear to auscultation bilaterally, no wheezing, no crackles. Normal respiratory effort. No accessory  muscle use.  Cardiovascular: IRR/IRR, tachycardic, having runs of multifocal PVCs and Vtach on monitor Abdomen: no tenderness, no masses palpated. No hepatosplenomegaly. Bowel sounds positive.  Musculoskeletal: no clubbing / cyanosis. No joint deformity upper and lower extremities. Good ROM, no contractures. Normal muscle tone.  Skin: no rashes, lesions, ulcers. No induration Neurologic: Opens eyes to voice but no vocalization Psychiatric: Unable to assess   Labs on Admission: I have personally reviewed following labs and imaging studies  CBC:  Recent Labs Lab 09/28/16 0521 10/01/16 1553  WBC 13.9* 19.0*  NEUTROABS  --  17.8*  HGB 8.7* 9.5*  HCT 24.3* 28.0*  MCV 93.8 97.6  PLT 82* 65*   Basic Metabolic Panel:  Recent Labs Lab 09/25/16 0251 09/26/16 0548 09/28/16 0521 09/28/16 1330 10/01/16 1553  NA 136 135 140  --  142  K 4.3 4.3 5.3* 4.8 3.7  CL 113* 109 108  --  104  CO2 17* 21* 23  --  29  GLUCOSE 140* 143* 202*  --  197*  BUN 38* 37* 46*  --  55*  CREATININE 1.30* 1.25* 1.34*  --  1.73*  CALCIUM 8.7* 9.0 8.3*  --  9.1  MG 1.9 2.1  --   --   --    GFR: Estimated Creatinine Clearance: 31.6 mL/min (A) (by C-G formula based on SCr of 1.73 mg/dL (H)). Liver Function Tests:  Recent Labs Lab 10/01/16 1553  AST 36  ALT 14*  ALKPHOS 67  BILITOT 0.9  PROT 4.6*  ALBUMIN 2.0*   No results for input(s): LIPASE, AMYLASE in the last 168 hours. No results for input(s): AMMONIA in the last 168 hours. Coagulation Profile: No results for input(s): INR, PROTIME in the last 168 hours. Cardiac Enzymes: No results for input(s): CKTOTAL, CKMB, CKMBINDEX, TROPONINI in the last 168 hours. BNP (last 3 results) No results for input(s): PROBNP in the last 8760 hours. HbA1C: No results for input(s): HGBA1C in the last 72 hours. CBG:  Recent Labs Lab 09/27/16 1126 09/27/16 1635 09/27/16 2105 09/28/16 0756 09/28/16 1223  GLUCAP 130* 251* 259* 204* 249*   Lipid  Profile: No results for input(s): CHOL, HDL, LDLCALC, TRIG, CHOLHDL, LDLDIRECT in the last 72 hours. Thyroid Function Tests: No results for input(s): TSH, T4TOTAL, FREET4, T3FREE, THYROIDAB in the last 72 hours. Anemia Panel: No results for input(s): VITAMINB12, FOLATE, FERRITIN, TIBC, IRON, RETICCTPCT in the last 72 hours. Urine analysis:    Component Value Date/Time   COLORURINE YELLOW 10/01/2016 1657   APPEARANCEUR CLEAR 10/01/2016 1657   LABSPEC 1.016 10/01/2016 1657   PHURINE 6.0 10/01/2016 1657   GLUCOSEU 50 (A) 10/01/2016 1657   GLUCOSEU NEGATIVE 10/21/2015 0903   HGBUR NEGATIVE 10/01/2016 1657   BILIRUBINUR NEGATIVE 10/01/2016 1657   KETONESUR NEGATIVE 10/01/2016 1657   PROTEINUR 100 (A) 10/01/2016 1657   UROBILINOGEN 0.2 10/21/2015 0903   NITRITE NEGATIVE 10/01/2016 1657   LEUKOCYTESUR NEGATIVE 10/01/2016 1657    Radiological Exams on Admission: Ct Head Wo Contrast  Result Date: 10/01/2016 CLINICAL DATA:  Sepsis. Fungal brain abscess. Ongoing decline in mental status, functional decline, febrile earlier today. EXAM: CT HEAD WITHOUT CONTRAST TECHNIQUE: Contiguous axial images were obtained from the base of the skull through the vertex without intravenous contrast. COMPARISON:  CT  head 09/18/2016.  MR head 09/18/2016. FINDINGS: Brain: Global atrophy. Hydrocephalus ex vacuo. No brain hemorrhage or extra-axial mass. There is vasogenic edema affecting the LEFT frontal region, LEFT basal ganglia, extending into the posterior LEFT temporal periatrial region. These areas were all involved with low-level ring-like enhancement representing multifocal brain abscesses as seen on most recent prior MR. Overall the mass effect and edema has regressed slightly in the frontal region since prior CT. The edema and mass effect in the LEFT temporal region appears worse. Vascular: No hyperdense vessel or unexpected calcification. Skull: Normal. Negative for fracture or focal lesion. Sinuses/Orbits:  Chronic mucosal thickening and reactive osteitis in the LEFT maxillary sinus. No layering fluid. BILATERAL cataract extraction. Other: None. IMPRESSION: Slight improvement in vasogenic edema affecting the LEFT frontal and LEFT basal ganglia, areas involved with multifocal fungal brain abscesses, as seen on most recent prior pre and postcontrast brain MR. There is slight worsening of edema in the LEFT temporal white matter. If no contraindications, repeat of that study could provide additional information. Electronically Signed   By: Staci Righter M.D.   On: 10/01/2016 18:23   Dg Chest Port 1 View  Result Date: 10/01/2016 CLINICAL DATA:  Sepsis. EXAM: PORTABLE CHEST 1 VIEW COMPARISON:  Radiograph of September 17, 2016. FINDINGS: Stable cardiomegaly. Atherosclerosis of thoracic aorta is noted. Sternotomy wires are noted. No acute pulmonary disease is noted. Interval placement of right-sided PICC line with distal tip in expected position of cavoatrial junction. No pneumothorax or pleural effusion is noted. Bony thorax is unremarkable. IMPRESSION: Aortic atherosclerosis. No acute cardiopulmonary abnormality seen. Interval placement of right-sided PICC line with distal tip in expected position of cavoatrial junction. Electronically Signed   By: Marijo Conception, M.D.   On: 10/01/2016 16:19    EKG: Independently reviewed.  Assessment/Plan Principal Problem:   Admission for end of life care Active Problems:   DM II (diabetes mellitus, type II), controlled ([place])   Hypertensive heart disease with congestive heart failure (HCC)   Thrombocytopenia (HCC)   Acute kidney injury ([place])   Chronic systolic CHF (congestive heart failure) (HCC)   Acute metabolic encephalopathy   Brain abscess   Aspergillus (HCC)   Severe sepsis with acute organ dysfunction ([place])    1. Admission for end of life care - this is an 83 yo M in for severe sepsis with multiple organ system involvement for the 2nd time in 2 weeks.  He has  fungal infection of sinuses, he has either fungal or bacterial abscesses on brain.  His sepsis has returned had his status has declined despite antifungals at the SNF.  Other organ involvement includes AKI, thrombocytopenia (possibly DIC but not tested), heart failure with significant ectopy, V.Tach runs, etc already on the monitor, and encephalopathy.  The CT scan confirms that there is persistent CNS involvement with improvement of edema at some locations, but slight worsening at least at one other.  His nutritional status is likely very poor with only trivial PO nutritional intake over the past 2 weeks, hypoalbuminemia, and profound peripheral edema.  He has made his wishes to family about being DNI/DNR and do not feeding tube clear.  Even if we were to somehow treat infection, and reverse his multi-organ failure despite all the above and challenges, his recovery would almost certainly be difficult and prolonged after spending nearly the past month in the hospital now.  His family has made clear that after hearing all of this, if he were able to stand here and  look at himself, he would not want to pursue further aggressive medical care and would want comfort measures only. 1. Comfort measures only 2. Will stop all invasive medical tests, treatments, and procedures 3. Re-contact Pal care in AM, ultimately patient likely to go to hospice vs in hospital death. 4. Morphine gtt for comfort  DVT prophylaxis: None Code Status: Comfort measures only Family Communication: Family all present at bedside Disposition Plan: Hospice Consults called: Pal care and ID have seen patient as noted above Admission status: Admit to inpatient for end of life care   [place], [place] [facility] Pager [phone]  If 7AM-7PM, please contact day team taking care of patient [email or web address] Password [email or web address]  10/01/2016, 10:56 PM

## 2016-10-02 DIAGNOSIS — R6520 Severe sepsis without septic shock: Secondary | ICD-10-CM

## 2016-10-02 DIAGNOSIS — A419 Sepsis, unspecified organism: Principal | ICD-10-CM

## 2016-10-02 DIAGNOSIS — Z515 Encounter for palliative care: Secondary | ICD-10-CM

## 2016-10-02 DIAGNOSIS — D696 Thrombocytopenia, unspecified: Secondary | ICD-10-CM

## 2016-10-02 DIAGNOSIS — R4182 Altered mental status, unspecified: Secondary | ICD-10-CM

## 2016-10-02 DIAGNOSIS — G9341 Metabolic encephalopathy: Secondary | ICD-10-CM

## 2016-10-02 DIAGNOSIS — I110 Hypertensive heart disease with heart failure: Secondary | ICD-10-CM

## 2016-10-02 DIAGNOSIS — I5022 Chronic systolic (congestive) heart failure: Secondary | ICD-10-CM

## 2016-10-02 LAB — BLOOD CULTURE ID PANEL (REFLEXED)
Acinetobacter baumannii: NOT DETECTED
CANDIDA GLABRATA: NOT DETECTED
CANDIDA KRUSEI: NOT DETECTED
CANDIDA TROPICALIS: NOT DETECTED
Candida albicans: NOT DETECTED
Candida parapsilosis: NOT DETECTED
ENTEROBACTER CLOACAE COMPLEX: NOT DETECTED
ESCHERICHIA COLI: NOT DETECTED
Enterobacteriaceae species: NOT DETECTED
Enterococcus species: NOT DETECTED
Haemophilus influenzae: NOT DETECTED
KLEBSIELLA PNEUMONIAE: NOT DETECTED
Klebsiella oxytoca: NOT DETECTED
Listeria monocytogenes: DETECTED — AB
NEISSERIA MENINGITIDIS: NOT DETECTED
PROTEUS SPECIES: NOT DETECTED
Pseudomonas aeruginosa: NOT DETECTED
SERRATIA MARCESCENS: NOT DETECTED
STAPHYLOCOCCUS SPECIES: NOT DETECTED
Staphylococcus aureus (BCID): NOT DETECTED
Streptococcus agalactiae: NOT DETECTED
Streptococcus pneumoniae: NOT DETECTED
Streptococcus pyogenes: NOT DETECTED
Streptococcus species: NOT DETECTED

## 2016-10-02 MED ORDER — SODIUM CHLORIDE 0.9% FLUSH
10.0000 mL | INTRAVENOUS | Status: DC | PRN
  Administered 2016-10-02: 10 mL

## 2016-10-02 NOTE — Progress Notes (Signed)
   10/02/16 1400  Clinical Encounter Type  Visited With Patient and family together;Health care provider  Visit Type Spiritual support;Other (Comment) (End of Life)  Referral From Nurse;Palliative care team  Consult/Referral To Chaplain  Spiritual Encounters  Spiritual Needs Prayer;Emotional  Stress Factors  Patient Stress Factors None identified  Family Stress Factors Family relationships;Health changes;Loss    Chaplain responded to [facility] consult for end of life, palliative care. Patient seems to be resting comfortably with large extended family surrounding his bedside. Wife indicated pastor is on vacation and that they could use some prayer and emotional support. Prayed with family, discussed meaning making at end of life and provided emotional support/ministry of presence. Simrat Kendrick L. Volanda Napoleon, MDiv

## 2016-10-02 NOTE — Discharge Summary (Signed)
Physician Discharge Summary  Derrick Fry JKD:[id number] DOB: 1927/08/08 DOA: 10/01/2016  PCP: Colon Branch, MD  Admit date: 10/01/2016 Discharge date: 10/02/2016  Time spent: 45 minutes  Recommendations for Outpatient Follow-up:  Patient will be discharged to [facility] for comfort care.   Discharge Diagnoses:  Admission for end-of-life care History of diabetes mellitus, type II Acute on Chronic systolic congestive heart failure Acute kidney injury Acute metabolic encephalopathy Severe Sepsis with acute organ dysfunction likely secondary to fungal infection Thrombocytopenia  Discharge Condition: Comfort care  Diet recommendation: Comfort  Filed Weights   10/01/16 1601  Weight: 86.6 kg (191 lb)    History of present illness:  On 10/01/2016 by Dr. Jennette Kettle Derrick Fry is a 83 y.o. male with medical history significant of Aortic dissection earlier this month, DM, HTN, HLD.  Patient was admitted on 7/15-7/26 for brain abscesses and fungal sinusitis with aspergillus.  During that admission he was treated with broad spectrum antibiotics and antifungals and nasal surgery performed, biopsy showed aspergillus.  Patient not a surgical candidate per neuro surgery.  No BCx, and cultures of brain never obtained.  He was discharged on antifungals to the SNF. At SNF he took a small amount PO but stopped taking anything PO over the past 24 hours and had temp up to 101 today.  Has had essentially minimal interaction with family and staff over past 24 hours.  Was brought back to the ED.  Hospital Course:  Patient was admitted for end-of-life care. He is an 83 year old male who presented with severe sepsis, multiorgan system involvement secondary to recent fungal infection of the sinuses, brain abscesses. Patient was recently admitted from 09/17/2016 to 09/28/2016. Despite treatment with antifungals, patient has declined. Patient presented with altered mental status was noted to have acute  kidney injury, thrombocytopenia, heart failure with elevated BNP. CT scan of the head showed slight improvement in vasogenic edema affecting the left frontal and basal ganglia, multifocal fungal brain abscesses. Slight worsening of edema in the left frontal temporal white matter. Infectious disease was consulted and appreciated, who also felt that patient had poor prognosis. Had recommended continuing antifungal medications. Admitting physician did speak with patient's family, decision was decided to pursue comfort care. Patient's family felt he would not 1 to pursue aggressive medical care. Patient currently DNR/DNI. Palliative care was consulted and appreciated. Patient was anticipated to have hospital death and was placed on morphine drip for comfort. Patient will be discharged to [facility] for continued comfort.  Procedures: None  Consultations: Infectious disease Palliative care  Discharge Exam: Vitals:   10/01/16 2345 10/02/16 0000  BP: 135/62 135/60  Pulse: 80 (!) 56  Resp: 18 16  Temp:     Currently unresponsive, breathing with mouth open (O), and eyes open with glazed over appearance.    General: Well developed,chronically ill appearing  HEENT: NCAT, mucous membranes dry  Cardiovascular: S1 S2 auscultated, irregular  Respiratory: Clear to auscultation anteriorly  Abdomen: Soft, nondistended, + bowel sounds  Extremities: warm dry without cyanosis clubbing. LE edema  Neuro: Unable to assess  Discharge Instructions  Current Discharge Medication List    STOP taking these medications     anidulafungin (ERAXIS) IVPB      atorvastatin (LIPITOR) 80 MG tablet      cyanocobalamin 100 MCG tablet      dexamethasone (DECADRON) 4 MG tablet      febuxostat (ULORIC) 40 MG tablet      feeding supplement, GLUCERNA SHAKE, ([place]  SHAKE) LIQD      folic acid (FOLVITE) 1 MG tablet      furosemide (LASIX) 20 MG tablet      hydrALAZINE (APRESOLINE) 100 MG tablet       insulin aspart (NOVOLOG) 100 UNIT/ML injection      ipratropium-albuterol (DUONEB) 0.5-2.5 (3) MG/3ML SOLN      metoprolol tartrate (LOPRESSOR) 50 MG tablet      Multiple Vitamins-Minerals (CENTRUM SILVER) tablet      nitroGLYCERIN (NITROSTAT) 0.4 MG SL tablet      omega-3 acid ethyl esters (LOVAZA) 1 g capsule      omeprazole (PRILOSEC) 40 MG capsule      polyethylene glycol (MIRALAX / GLYCOLAX) packet      saccharomyces boulardii (FLORASTOR) 250 MG capsule      voriconazole (VFEND) 50 MG tablet      insulin aspart (NOVOLOG) 100 UNIT/ML injection      oxymetazoline (AFRIN) 0.05 % nasal spray        Allergies  Allergen Reactions  . Colchicine     Wife cannot recall the reaction      The results of significant diagnostics from this hospitalization (including imaging, microbiology, ancillary and laboratory) are listed below for reference.    Significant Diagnostic Studies: Dg Chest 2 View  Result Date: 09/03/2016 CLINICAL DATA:  Back pain EXAM: CHEST  2 VIEW COMPARISON:  08/29/2016 FINDINGS: Prior CABG. Heart is borderline in size. Mild peribronchial thickening and interstitial prominence. Scarring in the left base. No acute opacities or effusions. No acute bony abnormality. IMPRESSION: Chronic bronchitic changes. Left basilar scarring. No active disease. Electronically Signed   By: Rolm Baptise M.D.   On: 09/03/2016 12:02   Ct Head Wo Contrast  Result Date: 10/01/2016 CLINICAL DATA:  Sepsis. Fungal brain abscess. Ongoing decline in mental status, functional decline, febrile earlier today. EXAM: CT HEAD WITHOUT CONTRAST TECHNIQUE: Contiguous axial images were obtained from the base of the skull through the vertex without intravenous contrast. COMPARISON:  CT head 09/18/2016.  MR head 09/18/2016. FINDINGS: Brain: Global atrophy. Hydrocephalus ex vacuo. No brain hemorrhage or extra-axial mass. There is vasogenic edema affecting the LEFT frontal region, LEFT basal ganglia,  extending into the posterior LEFT temporal periatrial region. These areas were all involved with low-level ring-like enhancement representing multifocal brain abscesses as seen on most recent prior MR. Overall the mass effect and edema has regressed slightly in the frontal region since prior CT. The edema and mass effect in the LEFT temporal region appears worse. Vascular: No hyperdense vessel or unexpected calcification. Skull: Normal. Negative for fracture or focal lesion. Sinuses/Orbits: Chronic mucosal thickening and reactive osteitis in the LEFT maxillary sinus. No layering fluid. BILATERAL cataract extraction. Other: None. IMPRESSION: Slight improvement in vasogenic edema affecting the LEFT frontal and LEFT basal ganglia, areas involved with multifocal fungal brain abscesses, as seen on most recent prior pre and postcontrast brain MR. There is slight worsening of edema in the LEFT temporal white matter. If no contraindications, repeat of that study could provide additional information. Electronically Signed   By: Staci Righter M.D.   On: 10/01/2016 18:23   Ct Head Wo Contrast  Result Date: 09/18/2016 CLINICAL DATA:  Aphasia EXAM: CT HEAD WITHOUT CONTRAST TECHNIQUE: Contiguous axial images were obtained from the base of the skull through the vertex without intravenous contrast. COMPARISON:  August 08, 2005 FINDINGS: Brain: There is mild diffuse atrophy. There is no intracranial mass, hemorrhage, extra-axial fluid collection, or midline shift. There is decreased  attenuation in the mid left frontal lobe. There is a suggestion within this area of decreased attenuation of a focal area of decreased attenuation with surrounding rim of increased attenuation measuring 7 x 7 mm, best seen on axial slice 21 series 2. There is localized sulcal effacement in the superior left frontal lobe in the area of this lesion. Elsewhere, there is small vessel disease in the centra semiovale bilaterally as well as in the left  external capsule. Vascular: There is no hyperdense vessel. There is calcification in each carotid siphon region. Skull: The bony calvarium appears intact. Sinuses/Orbits: There is opacification of the left maxillary antrum with extension of soft tissue material medial to the left maxillary antrum into the left nasal cavity with localized nares obstruction on the left. There is medial bowing of the medial wall of the left maxillary antrum. Elsewhere, there is opacification in superior left ethmoid air cells. There is mild mucosal thickening in the inferior left frontal sinus region. Orbits appear symmetric bilaterally. Other: Mastoid air cells are clear. IMPRESSION: 1. Decreased attenuation in the mid superior left frontal lobe. Within this area, there is a questionable 7 x 7 mm focus of decreased attenuation with increased attenuation periphery. Question small mass or abscess with edema in this area. An acute infarct in this area is an alternative differential consideration cannot be excluded on this noncontrast enhanced study. Brain MRI pre and post-contrast administration could be most helpful for further delineation in this regard. 2. Elsewhere, there is atrophy with patchy periventricular small vessel disease and small vessel disease throughout the left external capsule. No hemorrhage evident. 3. Areas of paranasal sinus disease. Extensive opacification in the left maxillary antrum with extension medially into the left nasal cavity with partial obstruction of the left nasal cavity. Suspect antrochoanal polyp with mucocele in this area. Areas of paranasal sinus disease also noted in the left anterior ethmoid and inferior left frontal regions. ENT evaluation may well be warranted given the changes arising from the left maxillary antrum. 4.  Areas of arterial vascular calcification noted. These results will be called to the ordering clinician or representative by the Radiologist Assistant, and communication  documented in the PACS or zVision Dashboard. Electronically Signed   By: Lowella Grip III M.D.   On: 09/18/2016 15:25   Ct Angio Chest Pe W And/or Wo Contrast  Result Date: 09/03/2016 CLINICAL DATA:  Upper back pain. Clinical concern for pulmonary embolus versus dissection. EXAM: CT ANGIOGRAPHY CHEST WITH CONTRAST TECHNIQUE: Multidetector CT imaging of the chest was performed using the standard protocol during bolus administration of intravenous contrast. Multiplanar CT image reconstructions and MIPs were obtained to evaluate the vascular anatomy. CONTRAST:  80 cc Isovue 370 intravenously. COMPARISON:  None. FINDINGS: Cardiovascular: Satisfactory opacification of the pulmonary arteries to the segmental level. No evidence of pulmonary embolism. Enlarged heart. No pericardial effusion. Heavy calcific atherosclerotic disease of the coronary arteries. Postsurgical changes from CABG. The thoracic aorta is very tortuous, with an ectatic portion of the descending thoracic aorta measuring 3.8 cm in greatest diameter. Heavy calcified and noncalcified plaque is seen along the aorta with asymmetric noncalcified plaque versus blood clot along the posterior wall of the descending aorta. There is a small amount of high density material along the medial wall of the descending aorta, image 73/sequence 10 extending inferiorly to the level of the mid thorax. Low fluid density material is seen along the posterior wall of the aorta in the distal thorax. Mediastinum/Nodes: No enlarged mediastinal, hilar, or  axillary lymph nodes. Thyroid gland, trachea, and esophagus demonstrate no significant findings. Lungs/Pleura: Low lung volumes with mild interstitial lung changes and hypoventilatory changes in the lower lobes. Upper Abdomen: No acute abnormality. Musculoskeletal: No chest wall abnormality. No acute or significant osseous findings. Review of the MIP images confirms the above findings. IMPRESSION: Ectatic aorta with eccentric  calcified and noncalcified plaque versus intramural blood clot, especially along the posterior wall of the descending aorta. High density material seen along the medial wall of the descending aorta, concerning for acute aortic syndrome, possibly small area of dissection or penetrating ulcer, which is not well visualized on this study, targeting better visualization of the pulmonary arteries. Surgical consultation is recommended. CT angiogram with and without contrast to better visualize the thoracic aorta may be considered if found clinically feasible. Aortic Atherosclerosis (ICD10-I70.0). No evidence of pulmonary embolus. Enlarged heart with heavily calcified coronary arteries. These results were called by telephone at the time of interpretation on 09/03/2016 at 2:16 pm to Dr. Davonna Belling , who verbally acknowledged these results. Electronically Signed   By: Fidela Salisbury M.D.   On: 09/03/2016 14:22   Mr Jeri Cos JO Contrast  Result Date: 09/18/2016 CLINICAL DATA:  83 y/o M; fever and altered mental status. Concern for brain abscess. EXAM: MRI HEAD WITHOUT AND WITH CONTRAST TECHNIQUE: Multiplanar, multiecho pulse sequences of the brain and surrounding structures were obtained without and with intravenous contrast. CONTRAST:  5mL MULTIHANCE GADOBENATE DIMEGLUMINE 529 MG/ML IV SOLN COMPARISON:  09/18/2016 CT head FINDINGS: Brain: Rim enhancing lesions with central reduced effusion within the left lentiform nucleus, caudate head, anterior corona radiata, extending into the left frontal lobe. The largest measures 26 x 18 x 14 mm (AP x ML x CC series 11, image 36 and series 12, image 17). There is a single additional subcentimeter focus within the left lateral temporal lobe (series 11, image 20). There is associated surrounding T2 hyperintense signal abnormality in white matter and local mass effect with partial effacement of frontal horn of left lateral ventricle and 5 mm left-to-right midline shift.  Background of mild chronic microvascular ischemic changes and moderate parenchymal volume loss of the brain. No hydrocephalus. No herniation. Small chronic infarction in the right cerebellar hemisphere. 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 Skull and upper cervical spine: Normal marrow signal. Sinuses/Orbits: Left maxillary and anterior ethmoid sinus opacification with a low signal nonenhancing lesion centered in the left ostiomeatal unit which may represent a inspissated mucocele or fungal ball. Bilateral intra-ocular lens replacement. Other: None. IMPRESSION: 1. Rim enhancing lesion with central reduced diffusion in the left frontal lobe consistent with abscess measuring up to 26 mm. Multiple additional subcentimeter abscesses throughout the left frontal lobe and left basal ganglia. Single subcentimeter abscess and left lateral temporal lobe. 2. Edema and associated mass effect results in partial effacement of frontal horn of left lateral ventricle and 5 mm of left-to-right midline shift. 3. 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 4. Left ostiomeatal unit nonenhancing low signal lesion may represent mucocele or possibly a fungal ball. Direct visualization recommended. These results will be called to the ordering clinician or representative by the Radiologist Assistant, and communication documented in the PACS or zVision Dashboard. Electronically Signed   By: Kristine Garbe M.D.   On: 09/18/2016 18:36   Mr Jodene Nam Chest Wo Contrast  Result Date: 09/06/2016 CLINICAL DATA:  83 year old with mid back pain. Evaluate for aortic  dissection. EXAM: MRA CHEST WITH OR WITHOUT CONTRAST TECHNIQUE: Angiographic images of the chest were obtained using MRA technique without contrast. CONTRAST:  None COMPARISON:  Chest CT 09/03/2016 and CT from 02/09/2016 FINDINGS: VASCULAR Aorta: Mid ascending thoracic aorta is ectatic measuring 3.8 cm but no  evidence for a dissection involving the ascending thoracic aorta or aortic arch. No gross abnormality involving the great vessels. Again noted is atherosclerotic plaque involving the descending thoracic aorta. In addition, there is abnormal signal along the medial aspect of the proximal descending thoracic aorta and this corresponds with the recent CTA findings. The configuration is concerning for an intramural hematoma or limited dissection at this area. The proximal descending thoracic aorta measures up to 4.4 cm and similar to the recent CTA. Again noted is a small amount of fluid posterior to the mid descending thoracic aorta but this may actually represent pleural fluid and similar to the recent CTA. This dissection or intramural hematoma does not extend into the descending thoracic aorta or the aortic arch. Heart: Heart size is within normal limits.  Post CABG changes. Pulmonary Arteries: Stable appearance of the main pulmonary arteries. Other: Again noted are parenchymal densities in the lower lungs but limited evaluation on this MR examination. NON-VASCULAR Musculoskeletal: No gross abnormality to the spinal canal or bones. Upper abdomen: Evidence for a solitary right kidney with an extrarenal pelvis. Extensive motion artifact. Limited evaluation of the upper abdominal structures. IMPRESSION: Abnormal appearance of the proximal descending thoracic aorta. Findings remain compatible with an intramural hematoma or limited aortic dissection in this area. This aortic process is stable since 09/03/2016. Please note that the exam has technical limitations due to motion artifact and the lack of intravenous contrast. Ideally, this aortic process could be followed with a dedicated CTA dissection protocol, with and without contrast, if the patient's renal function can tolerate it. Again noted is a small amount of fluid just posterior to the mid descending thoracic aorta which may actually be in the pleural space and  similar to the recent CTA. 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 Findings are similar to the prior abdominal CT on 02/09/2016. Electronically Signed   By: Markus Daft M.D.   On: 09/06/2016 12:24   US Renal  Result Date: 09/06/2016 CLINICAL DATA:  Acute kidney injury.  Left nephrectomy 1996. EXAM: RENAL / URINARY TRACT ULTRASOUND COMPLETE COMPARISON:  07/22/2012 and CT 01/10/2013 FINDINGS: Right Kidney: Length: 12.5 cm. Echogenicity within normal limits. No mass or hydronephrosis visualized. Cysts versus prominent renal pelvis measuring 3.4 cm (prominent extrarenal pelvis noted on previous CT). Left Kidney: Surgically absent. Bladder: Appears normal for degree of bladder distention. IMPRESSION: Normal size right kidney without hydronephrosis. Mildly prominent right renal pelvis versus 3.4 cm cyst. Surgical absence of the left kidney. Electronically Signed   By: Marin Olp M.D.   On: 09/06/2016 12:40   Ir Fluoro Guide Cv Line Right  Result Date: 09/27/2016 CLINICAL DATA:  Brain abscess, needs durable venous access for IV antibiotic regimen EXAM: PICC PLACEMENT WITH ULTRASOUND AND FLUOROSCOPY FLUOROSCOPY TIME:  12 seconds, 1 mGy TECHNIQUE: After written informed consent was obtained, patient was placed in the supine position on angiographic table. Patency of the right basilic vein was confirmed with ultrasound with image documentation. An appropriate skin site was determined. Skin site was marked. Region was prepped using maximum barrier technique including cap and mask, sterile gown, sterile gloves, large sterile sheet, and Chlorhexidine as cutaneous antisepsis. The region was infiltrated locally with 1% lidocaine.  Under real-time ultrasound guidance, the right basilic vein was accessed with a 21 gauge micropuncture needle; the needle tip within the vein was confirmed with ultrasound image documentation. Needle exchanged over a 018 guidewire for a peel-away sheath, through which a 5-French  double-lumen power injectable PICC trimmed to 36cm was advanced, positioned with its tip near the cavoatrial junction. Spot chest radiograph confirms appropriate catheter position. Catheter was flushed per protocol and secured externally. The patient tolerated procedure well. COMPLICATIONS: COMPLICATIONS none IMPRESSION: 1. Technically successful five [place] double lumen power injectable PICC placement Electronically Signed   By: Lucrezia Europe M.D.   On: 09/27/2016 09:38   Ir US Guide Vasc Access Right  Result Date: 09/27/2016 CLINICAL DATA:  Brain abscess, needs durable venous access for IV antibiotic regimen EXAM: PICC PLACEMENT WITH ULTRASOUND AND FLUOROSCOPY FLUOROSCOPY TIME:  12 seconds, 1 mGy TECHNIQUE: After written informed consent was obtained, patient was placed in the supine position on angiographic table. Patency of the right basilic vein was confirmed with ultrasound with image documentation. An appropriate skin site was determined. Skin site was marked. Region was prepped using maximum barrier technique including cap and mask, sterile gown, sterile gloves, large sterile sheet, and Chlorhexidine as cutaneous antisepsis. The region was infiltrated locally with 1% lidocaine. Under real-time ultrasound guidance, the right basilic vein was accessed with a 21 gauge micropuncture needle; the needle tip within the vein was confirmed with ultrasound image documentation. Needle exchanged over a 018 guidewire for a peel-away sheath, through which a 5-French double-lumen power injectable PICC trimmed to 36cm was advanced, positioned with its tip near the cavoatrial junction. Spot chest radiograph confirms appropriate catheter position. Catheter was flushed per protocol and secured externally. The patient tolerated procedure well. COMPLICATIONS: COMPLICATIONS none IMPRESSION: 1. Technically successful five [place] double lumen power injectable PICC placement Electronically Signed   By: Lucrezia Europe M.D.   On:  09/27/2016 09:38   Dg Chest Port 1 View  Result Date: 10/01/2016 CLINICAL DATA:  Sepsis. EXAM: PORTABLE CHEST 1 VIEW COMPARISON:  Radiograph of September 17, 2016. FINDINGS: Stable cardiomegaly. Atherosclerosis of thoracic aorta is noted. Sternotomy wires are noted. No acute pulmonary disease is noted. Interval placement of right-sided PICC line with distal tip in expected position of cavoatrial junction. No pneumothorax or pleural effusion is noted. Bony thorax is unremarkable. IMPRESSION: Aortic atherosclerosis. No acute cardiopulmonary abnormality seen. Interval placement of right-sided PICC line with distal tip in expected position of cavoatrial junction. Electronically Signed   By: Marijo Conception, M.D.   On: 10/01/2016 16:19   Dg Chest Port 1 View  Result Date: 09/17/2016 CLINICAL DATA:  Cough EXAM: PORTABLE CHEST 1 VIEW COMPARISON:  09/04/2016 chest radiograph FINDINGS: Intact sternotomy wires. CABG clips overlie the mediastinum. Stable cardiomediastinal silhouette with mild cardiomegaly and aortic atherosclerosis. No pneumothorax. No pleural effusion. Cephalization of the pulmonary vasculature without overt pulmonary edema. Stable reticular opacities at both lung bases. IMPRESSION: 1. Stable mild cardiomegaly without overt pulmonary edema. 2. Chronic bibasilar reticular lung opacities, favor scarring or atelectasis. Electronically Signed   By: Ilona Sorrel M.D.   On: 09/17/2016 18:02   Dg Chest Port 1 View  Result Date: 09/04/2016 CLINICAL DATA:  Aortic dissection. EXAM: PORTABLE CHEST 1 VIEW COMPARISON:  CT 09/03/2016.  Chest x-ray 09/03/2016. FINDINGS: Prior CABG. Heart size stable. Tortuous thoracic aorta again noted, reference made to prior CT report 09/03/2016. Heart size stable. Low lung volumes with basilar atelectasis. No pleural effusion or pneumothorax . IMPRESSION: 1. Prior  CABG. Heart size stable. Tortuous thoracic aorta again noted, reference made to prior CT report 09/03/2016. 2.   Bibasilar subsegmental atelectasis. Electronically Signed   By: Marcello Moores  Register   On: 09/04/2016 06:48   Ct Maxillofacial Wo Contrast  Result Date: 09/21/2016 CLINICAL DATA:  Sinusitis.  Brain abscesses. EXAM: CT MAXILLOFACIAL WITHOUT CONTRAST TECHNIQUE: Multidetector CT images of the paranasal sinuses were obtained using the standard protocol without intravenous contrast. COMPARISON:  Head CT and MRI 09/18/2016 FINDINGS: Paranasal sinuses: Frontal: Large left frontal sinus which extends across the midline to the right with only minimal mucosal thickening inferiorly. Extremely hypoplastic/ aplastic right frontal sinus. Patent frontal sinus drainage pathways. Ethmoid: Partial opacification of a few anterior/ mid left ethmoid air cells. Clear right ethmoid air cells. Maxillary: Near complete opacification of the left maxillary sinus by relatively homogeneous intermediate density soft tissue which bulges medially into the left nasal cavity. Diffuse left maxillary sinus osteitis is compatible with a component of chronic sinusitis. No osseous erosion is identified. The right maxillary sinus is clear. Sphenoid: Normally aerated. Patent sphenoethmoidal recesses. Right ostiomeatal unit: Patent. Left ostiomeatal unit: Left maxillary sinus material extending through the ostium into the nasal cavity as above. Nasal passages: Leftward nasal septal deviation anteriorly by 7 mm. Asymmetrically small left middle and left inferior nasal turbinates. Paradoxical rotation of the right middle turbinate. Anatomy: No pneumatization superior to anterior ethmoid notches. Intact olfactory grooves and fovea ethmoidalis, Keros I vs II on the right and Keros I on the left. Sellar sphenoid pneumatization pattern. No dehiscence of carotid or optic canals. Bilateral onodi cells are present. Other: Brain findings, including left frontal lobe edema, are better evaluated on recent MRI. Prior bilateral cataract extraction is noted. Calcified  atherosclerosis is present at the skullbase. IMPRESSION: Evidence of chronic left maxillary sinusitis with near complete sinus opacification currently. Sinus material bulges into the nasal cavity and may reflect infection (including chronic fungal infections/mycetoma), polyp, or mucocele. Electronically Signed   By: Logan Bores M.D.   On: 09/21/2016 11:20    Microbiology: Recent Results (from the past 240 hour(s))  Blood Culture (routine x 2)     Status: None (Preliminary result)   Collection Time: 10/01/16  4:53 PM  Result Value Ref Range Status   Specimen Description BLOOD PICC LINE  Final   Special Requests   Final    BOTTLES DRAWN AEROBIC AND ANAEROBIC Blood Culture results may not be optimal due to an inadequate volume of blood received in culture bottles   Culture  Setup Time   Final    GRAM POSITIVE RODS IN BOTH AEROBIC AND ANAEROBIC BOTTLES Organism ID to follow CRITICAL RESULT CALLED TO, READ BACK BY AND VERIFIED WITH: E. Vernona Rieger Pharm.D. 12:45 10/02/16 ([email or web address])    Culture GRAM POSITIVE RODS  Final   Report Status PENDING  Incomplete  Blood Culture ID Panel (Reflexed)     Status: Abnormal   Collection Time: 10/01/16  4:53 PM  Result Value Ref Range Status   Enterococcus species NOT DETECTED NOT DETECTED Final   Listeria monocytogenes DETECTED (A) NOT DETECTED Final    Comment: CRITICAL RESULT CALLED TO, READ BACK BY AND VERIFIED WITH: E. Vernona Rieger Pharm.D. 12:45 10/02/16 ([email or web address])    Staphylococcus species NOT DETECTED NOT DETECTED Final   Staphylococcus aureus NOT DETECTED NOT DETECTED Final   Streptococcus species NOT DETECTED NOT DETECTED Final   Streptococcus agalactiae NOT DETECTED NOT DETECTED Final   Streptococcus pneumoniae NOT DETECTED NOT DETECTED Final   Streptococcus pyogenes  NOT DETECTED NOT DETECTED Final   Acinetobacter baumannii NOT DETECTED NOT DETECTED Final   Enterobacteriaceae species NOT DETECTED NOT DETECTED Final   Enterobacter cloacae complex NOT  DETECTED NOT DETECTED Final   Escherichia coli NOT DETECTED NOT DETECTED Final   Klebsiella oxytoca NOT DETECTED NOT DETECTED Final   Klebsiella pneumoniae NOT DETECTED NOT DETECTED Final   Proteus species NOT DETECTED NOT DETECTED Final   Serratia marcescens NOT DETECTED NOT DETECTED Final   Haemophilus influenzae NOT DETECTED NOT DETECTED Final   Neisseria meningitidis NOT DETECTED NOT DETECTED Final   Pseudomonas aeruginosa NOT DETECTED NOT DETECTED Final   Candida albicans NOT DETECTED NOT DETECTED Final   Candida glabrata NOT DETECTED NOT DETECTED Final   Candida krusei NOT DETECTED NOT DETECTED Final   Candida parapsilosis NOT DETECTED NOT DETECTED Final   Candida tropicalis NOT DETECTED NOT DETECTED Final     Labs: Basic Metabolic Panel:  Recent Labs Lab 09/26/16 0548 09/28/16 0521 09/28/16 1330 10/01/16 1553  NA 135 140  --  142  K 4.3 5.3* 4.8 3.7  CL 109 108  --  104  CO2 21* 23  --  29  GLUCOSE 143* 202*  --  197*  BUN 37* 46*  --  55*  CREATININE 1.25* 1.34*  --  1.73*  CALCIUM 9.0 8.3*  --  9.1  MG 2.1  --   --   --    Liver Function Tests:  Recent Labs Lab 10/01/16 1553  AST 36  ALT 14*  ALKPHOS 67  BILITOT 0.9  PROT 4.6*  ALBUMIN 2.0*   No results for input(s): LIPASE, AMYLASE in the last 168 hours. No results for input(s): AMMONIA in the last 168 hours. CBC:  Recent Labs Lab 09/28/16 0521 10/01/16 1553  WBC 13.9* 19.0*  NEUTROABS  --  17.8*  HGB 8.7* 9.5*  HCT 24.3* 28.0*  MCV 93.8 97.6  PLT 82* 65*   Cardiac Enzymes: No results for input(s): CKTOTAL, CKMB, CKMBINDEX, TROPONINI in the last 168 hours. BNP: BNP (last 3 results)  Recent Labs  10/01/16 1830  BNP 2,691.7*    ProBNP (last 3 results) No results for input(s): PROBNP in the last 8760 hours.  CBG:  Recent Labs Lab 09/27/16 1126 09/27/16 1635 09/27/16 2105 09/28/16 0756 09/28/16 1223  GLUCAP 130* 251* 259* 204* 249*       Signed:  Cristal Ford  [facility] 10/02/2016, 1:52 PM

## 2016-10-02 NOTE — Consult Note (Signed)
[facility]  Received request from [place] for family interest in [facility]. Chart reviewed and met with family to complete paperwork for transfer tomorrow. Will follow up with family in am.   Discharge summary will need to be faxed to [phone].  RN please call report to [phone].  Thank you,  Erling Conte, LCSW [phone]

## 2016-10-02 NOTE — Progress Notes (Signed)
PHARMACY - PHYSICIAN COMMUNICATION CRITICAL VALUE ALERT - BLOOD CULTURE IDENTIFICATION (BCID)  Results for orders placed or performed during the hospital encounter of 10/01/16  Blood Culture ID Panel (Reflexed) (Collected: 10/01/2016  4:53 PM)  Result Value Ref Range   Enterococcus species NOT DETECTED NOT DETECTED   Listeria monocytogenes DETECTED (A) NOT DETECTED   Staphylococcus species NOT DETECTED NOT DETECTED   Staphylococcus aureus NOT DETECTED NOT DETECTED   Streptococcus species NOT DETECTED NOT DETECTED   Streptococcus agalactiae NOT DETECTED NOT DETECTED   Streptococcus pneumoniae NOT DETECTED NOT DETECTED   Streptococcus pyogenes NOT DETECTED NOT DETECTED   Acinetobacter baumannii NOT DETECTED NOT DETECTED   Enterobacteriaceae species NOT DETECTED NOT DETECTED   Enterobacter cloacae complex NOT DETECTED NOT DETECTED   Escherichia coli NOT DETECTED NOT DETECTED   Klebsiella oxytoca NOT DETECTED NOT DETECTED   Klebsiella pneumoniae NOT DETECTED NOT DETECTED   Proteus species NOT DETECTED NOT DETECTED   Serratia marcescens NOT DETECTED NOT DETECTED   Haemophilus influenzae NOT DETECTED NOT DETECTED   Neisseria meningitidis NOT DETECTED NOT DETECTED   Pseudomonas aeruginosa NOT DETECTED NOT DETECTED   Candida albicans NOT DETECTED NOT DETECTED   Candida glabrata NOT DETECTED NOT DETECTED   Candida krusei NOT DETECTED NOT DETECTED   Candida parapsilosis NOT DETECTED NOT DETECTED   Candida tropicalis NOT DETECTED NOT DETECTED    Name of physician (or Provider) Contacted: Mikhail   Changes to prescribed antibiotics required: [place], PharmD PGY2 Infectious Diseases Pharmacy Resident Pager: [phone]  10/02/2016  12:53 PM

## 2016-10-02 NOTE — Consult Note (Signed)
Consultation Note Date: 10/02/2016   Patient Name: Derrick Fry  DOB: January 04, 1928  MRN: [id number]  Age / Sex: 83 y.o., male  PCP: Colon Branch, MD Referring Physician: Cristal Ford, DO  Reason for Consultation: Establishing goals of care  HPI/Patient Profile: 83 y.o. male admitted on 10/01/2016  Derrick Fry is a 83 y.o. male with medical history significant of Aortic dissection earlier this month, DM, HTN, HLD.  Patient was admitted on 7/15-7/26 for brain abscesses and fungal sinusitis with aspergillus.  During that admission he was treated with broad spectrum antibiotics and antifungals and nasal surgery performed, biopsy showed aspergillus.  Patient not a surgical candidate per neuro surgery.  No BCx, and cultures of brain never obtained.  He was discharged on antifungals to the SNF.  At SNF he took a small amount PO but stopped taking anything PO over the past 24 hours and had temp up to 101 today.  Has had essentially minimal interaction with family and staff over past 24 hours.  Was brought back to the ED on 10-01-16.   Clinical Assessment and Goals of Care: A palliative consult has been requested for early goals of care conversations and for supporting the wife and family.   I met with patient's wife, grand daughter and son in law in the ED on 10-01-16. I introduced myself and palliative care as follows: Palliative medicine is specialized medical care for people living with serious illness. It focuses on providing relief from the symptoms and stress of a serious illness. The goal is to improve quality of life for both the patient and the family.   Interim hospital course: Patient is now admitted to hospitalist medicine service, I fully agree with his current plan of care, to focus on comfort measures, he is comfortable on morphine drip, titrate to comfort.   See recommendations below, thank you for the  consult.   NEXT OF KIN  wife    SUMMARY OF RECOMMENDATIONS    Agree with DNR DNI Agree with full comfort measures Actively dying Prognosis likely hours to some very limited number of days Anticipate hospital death Continue to support patient's family Comfort cart to room when family arrives Chaplain consult for spiritual care at end of life.   Code Status/Advance Care Planning:  DNR    Symptom Management:      Palliative Prophylaxis:   Delirium Protocol  Additional Recommendations (Limitations, Scope, Preferences):  Full Comfort Care  Psycho-social/Spiritual:   Desire for further Chaplaincy support:yes  Additional Recommendations: Education on Hospice  Prognosis:   Hours - Days  Discharge Planning: Anticipated Hospital Death      Primary Diagnoses: Present on Admission: . Brain abscess . Acute kidney injury ([place]) . Acute metabolic encephalopathy . Aspergillus ([place]) . Thrombocytopenia ([place]) . Hypertensive heart disease with congestive heart failure ([place]) . Chronic systolic CHF (congestive heart failure) ([place]) . Severe sepsis with acute organ dysfunction ([place])   I have reviewed the medical record, interviewed the patient and family, and examined the patient. The  following aspects are pertinent.  Past Medical History:  Diagnosis Date  .  peripheral neuropathy --UDS--pain mngmt  09/05/2006   Qualifier: Diagnosis of  By: Larose Kells MD, [place] AAA (abdominal aortic aneurysm) ([place])   . Anemia   . Aortic dissection ([place]) 09/03/2016  . Arrhythmia 02/09/2016  . Arthritis    "all over" (02/10/2016)  . Basal cell carcinoma of face    "burned off" (02/10/2016)  . CAD (coronary artery disease)    MI 08-04-85  . Chronic lower back pain   . Decreased cardiac ejection fraction 09/04/2016   EF 35%  . Diabetes mellitus with neuropathy ([place])    "borderline" (02/10/2016)  . DM II (diabetes mellitus, type II), controlled ([place]) 09/05/2006   Qualifier: Diagnosis of  By:  Larose Kells MD, [place] DVT (deep venous thrombosis) ([place]) 02/2016   "left thigh"  . Essential hypertension 09/05/2006   Qualifier: Diagnosis of  By: Larose Kells MD, [place] Gait abnormality    chronic imbalance  . GAIT DISTURBANCE 08/22/2007   Qualifier: Diagnosis of  By: Larose Kells MD, [place] Gout    diskitis 10/2008, Dr Ouida Sills  . Gout 08/22/2007   Gout diagnosed 10-2008. he had a  L4 L5-S1 gout diskitis DEXA neg 3-09 and 08-2009   . Heart murmur dx'd 02/2016  . High cholesterol 03/23/2009   Qualifier: Diagnosis of  By: Larose Kells MD, [place] History of DVT (deep vein thrombosis) 02/09/2016  . Hyperlipidemia   . Hypertension   . Myocardial infarction ([place]) 1987   "before OHS"  . OSA (obstructive sleep apnea)    Limited CPAP tolerance (02/10/2016)  . Osteoarthritis   . Pneumonia 1938   had right pneumonia pleurisy requiring resection of ribs and chest tube drainage at age 89  . Pulmonary embolism ([place]) 02/2016   "left"  . Pulmonary fibrosis ([place]) 01/29/2013  . Renal insufficiency    chronic w/ solitary kidney, congenital  . RENAL INSUFFICIENCY, CHRONIC 09/05/2006   Qualifier: Diagnosis of  By: Larose Kells MD, [place] SLEEP APNEA 09/05/2006   History of a sleep study in 2006 by Dr. Annamaria Boots    . Small bowel obstruction ([place]) 04/01/2012  . SOLITARY KIDNEY, CONGENITAL 09/05/2006   Qualifier: Diagnosis of  By: Larose Kells MD, [place]    Social History   Social History  . Marital status: Married    Spouse name: N/A  . Number of children: 2  . Years of education: N/A   Occupational History  . retired Retired   Social History Main Topics  . Smoking status: Former Smoker    Packs/day: 1.00    Years: 30.00    Types: Cigarettes    Quit date: 04/02/1972  . Smokeless tobacco: Never Used  . Alcohol use No     Comment: former heavy alcohol use  . Drug use: No  . Sexual activity: No   Other Topics Concern  . Not on file   Social History Narrative   Lost a son    Lives at home with wife    Still drives      Admitted to [facility]11/16   Married -[employer] [facility]   Former smoker-stopped 1974   Alcohol none   DNR   Family History  Problem Relation Age of Onset  . Heart disease Father   . Lymphoma Sister   . Liver cancer Brother   .  Lung cancer Brother   . Ulcerative colitis Brother   . Heart disease Brother   . Diabetes Brother   . Colon cancer Neg Hx   . Prostate cancer Neg Hx   . Stomach cancer Neg Hx   . Esophageal cancer Neg Hx   . Rectal cancer Neg Hx    Scheduled Meds: Continuous Infusions: . morphine 2 mg/hr (10/01/16 2338)   PRN Meds:.acetaminophen **OR** acetaminophen, antiseptic oral rinse, glycopyrrolate **OR** glycopyrrolate **OR** glycopyrrolate, haloperidol **OR** haloperidol **OR** haloperidol lactate, morphine, ondansetron **OR** ondansetron (ZOFRAN) IV, polyvinyl alcohol, sodium chloride flush Medications Prior to Admission:  Prior to Admission medications   Medication Sig Start Date End Date Taking? Authorizing Provider  anidulafungin (ERAXIS) IVPB Inject 100 mg into the vein daily. Indication:  CNS fungal infection Last Day of Therapy:  12/27/16 Labs - Once weekly:  CBC/D and BMP, Labs - Every other week:  ESR and CRP 09/28/16 12/27/16 Yes Bonnielee Haff, MD  atorvastatin (LIPITOR) 80 MG tablet Take 1 tablet (80 mg total) by mouth daily. 08/25/16  Yes Paz, Alda Berthold, MD  cyanocobalamin 100 MCG tablet Take 100 mcg by mouth daily.    Yes [provider]  dexamethasone (DECADRON) 4 MG tablet 58m twice daily for 1 week and then 2 mg twice daily till follow up with Infectious Diseases. Patient taking differently: Take 2-4 mg by mouth See admin instructions. 4 mg two times a day for 1 week then 2 mg two times a day UNTIL FOLLOW UP WITH INFECTIOUS DISEASE FOR BRAIN EDEMA 09/28/16  Yes KBonnielee Haff MD  febuxostat (ULORIC) 40 MG tablet Take 40 mg by mouth daily.    Yes [provider]  feeding supplement, GLUCERNA SHAKE, (GLUCERNA SHAKE) LIQD  Take 237 mLs by mouth 3 (three) times daily between meals. 09/28/16  Yes KBonnielee Haff MD  folic acid (FOLVITE) 1 MG tablet Take 1 mg by mouth daily.   Yes [provider]  furosemide (LASIX) 20 MG tablet Take 1 tablet (20 mg total) by mouth every other day. 09/29/16  Yes KBonnielee Haff MD  hydrALAZINE (APRESOLINE) 100 MG tablet Take 100 mg by mouth every 8 (eight) hours.   Yes [provider]  insulin aspart (NOVOLOG) 100 UNIT/ML injection Use following sliding scale three times a day. Check CBG before each meal.  For CBG less than 120: No insulin CBG: 121-150: 2 units SQ CBG: 151-200: 3 Units SQ CBG: 201-250: 5 Units SQ CBG: 251-300: 7 units and call MD for further instructions Patient taking differently: Inject 0-10 Units into the skin 3 (three) times daily with meals. Per sliding scale: BGL <200 = 0 units; 200-250 = 2 units; 257-300 = 4 units; 301-350 = 6 units; 357-400 = 8 units; >400 = 10 units, then re-check BGL in one hour 09/28/16  Yes KBonnielee Haff MD  ipratropium-albuterol (DUONEB) 0.5-2.5 (3) MG/3ML SOLN Take 3 mLs by nebulization every 6 (six) hours as needed (for wheezing/shortness of breath).   Yes [provider]  metoprolol tartrate (LOPRESSOR) 50 MG tablet Take 1 tablet (50 mg total) by mouth 2 (two) times daily. 09/11/16  Yes Regalado, Belkys A, MD  Multiple Vitamins-Minerals (CENTRUM SILVER) tablet Take 1 tablet by mouth daily.    Yes [provider]  nitroGLYCERIN (NITROSTAT) 0.4 MG SL tablet Place 1 tablet (0.4 mg total) under the tongue every 5 (five) minutes as needed for chest pain. 08/05/15  Yes Nahser, PWonda Cheng MD  omega-3 acid ethyl esters (LOVAZA) 1 g capsule  Take 2 g by mouth daily.   Yes [provider]  omeprazole (PRILOSEC) 40 MG capsule Take 1 capsule (40 mg total) by mouth daily. 02/15/16  Yes Milus Banister, MD  polyethylene glycol [facility] / Floria Raveling) packet Take 17 g by mouth daily as needed for mild constipation  or moderate constipation.    Yes [provider]  saccharomyces boulardii (FLORASTOR) 250 MG capsule Take 250 mg by mouth daily.   Yes [provider]  voriconazole (VFEND) 50 MG tablet Take 7 tablets (350 mg total) by mouth 2 (two) times daily. Indication:  CNS fungal infection Last Day of Therapy:  12/27/16 Labs - Once weekly:  CBC/D and BMP Labs - Every other week:  ESR and CRP' Labs- Every 5 days: Voriconazole level 09/28/16 12/27/16 Yes Bonnielee Haff, MD  insulin aspart (NOVOLOG) 100 UNIT/ML injection Inject 3 Units into the skin 3 (three) times daily with meals. Patient not taking: Reported on 10/01/2016 09/28/16   Bonnielee Haff, MD  oxymetazoline (AFRIN) 0.05 % nasal spray Place 1 spray into both nostrils once as needed for congestion (Bedside procedure). Patient not taking: Reported on 10/01/2016 09/28/16   Bonnielee Haff, MD   Allergies  Allergen Reactions  . Colchicine     Wife cannot recall the reaction   Review of Systems Non verbal  Physical Exam Pale weak gentleman, eyes open Shallow breathing S1 S2 Abdomen soft No edema Thin muscle wasting evident Not alert, non verbal some coolness no mottling of extremities.  Vital Signs: BP 135/60   Pulse (!) 56   Temp 99.2 F (37.3 C) (Axillary)   Resp 16   Ht 5' 7"  (1.702 m)   Wt 86.6 kg (191 lb)   SpO2 96%   BMI 29.91 kg/m  Pain Assessment: Faces   Pain Score: Asleep   SpO2: SpO2: 96 % O2 Device:SpO2: 96 % O2 Flow Rate: .   IO: Intake/output summary:  Intake/Output Summary (Last 24 hours) at 10/02/16 2800 Last data filed at 10/02/16 0700  Gross per 24 hour  Intake              500 ml  Output              550 ml  Net              -50 ml    LBM:   Baseline Weight: Weight: 86.6 kg (191 lb) Most recent weight: Weight: 86.6 kg (191 lb)     Palliative Assessment/Data:     Time In:  8 Time Out: 9   Time Total:60 min    Greater than 50%  of this time was spent counseling and  coordinating care related to the above assessment and plan.  Signed by: Loistine Chance, MD  [phone]  Please contact Palliative Medicine Team phone at [phone] for questions and concerns.  For individual provider: See Shea Evans

## 2016-10-02 NOTE — Consult Note (Signed)
   [facility] CM Inpatient Consult   10/02/2016  Derrick Fry 1927/03/29 [id number]   Patient assessed for -re-admission.  Patient a beneficiary of [employer] plan.  Chart review reveals the patient is being discharged to [facility] for residential hospice care.  No [facility] Care Management needs.  Natividad Brood, RN BSN [place] [facility] Liaison  [phone] business mobile phone Toll free office [phone]

## 2016-10-02 NOTE — Clinical Social Work Note (Signed)
Received consult for residential hospice and talked with patient's wife and granddaughter Maudie Mercury and facililty selected is [facility]. Referral made to Erling Conte, [place] with [facility]. Admissions paperwork completed and patient will d/c to [facility] 7/31.  Semone Orlov Givens, MSW, LCSW Licensed Clinical Social Worker [place] [phone]

## 2016-10-03 ENCOUNTER — Telehealth: Payer: Self-pay | Admitting: Cardiovascular Disease

## 2016-10-03 NOTE — Clinical Social Work Note (Signed)
Patient going to [facility] today. PT[facility] called at 9:47 pm. CSW signing off as patient discharging to hospice facility.  Malary Aylesworth Givens, MSW, LCSW Licensed Clinical Social Worker [place] [phone]

## 2016-10-03 NOTE — Telephone Encounter (Signed)
New Message     Per wife patient was moved to [facility] today, she cancelled all his appointments, they told her he might not make it through the night, if things change she will call back and reschedule.  Wanted to let Dr Acie Fredrickson to know what was going on.

## 2016-10-03 NOTE — Telephone Encounter (Signed)
I appreciate her letting us know   

## 2016-10-03 NOTE — Progress Notes (Signed)
Report called to Nurse Arbie Cookey at [facility]. Pt will be transported with DNR to facility via transport. Family at Bedside

## 2016-10-03 NOTE — Progress Notes (Signed)
Discharge summary done on 10/02/2016. Patient to be discharged to [facility]. No changes. Family at bedside.   Time spent: 10 minutes  Amayrani Bennick D.O. [facility] Pager [phone]  If 7PM-7AM, please contact night-coverage [email or web address] Password [facility] 10/03/2016, 11:23 AM

## 2016-10-03 NOTE — Progress Notes (Signed)
Derrick Fry to be D/C'd Skilled nursing facility per MD order.  Discussed prescriptions and follow up appointments with the patient. Prescriptions given to patient, medication list explained in detail. Pt verbalized understanding.  Allergies as of 10/03/2016      Reactions   Colchicine    Wife cannot recall the reaction      Medication List    STOP taking these medications   anidulafungin IVPB Commonly known as:  ERAXIS   atorvastatin 80 MG tablet Commonly known as:  LIPITOR   CENTRUM SILVER tablet   cyanocobalamin 100 MCG tablet   dexamethasone 4 MG tablet Commonly known as:  DECADRON   feeding supplement (GLUCERNA SHAKE) Liqd   folic acid 1 MG tablet Commonly known as:  FOLVITE   furosemide 20 MG tablet Commonly known as:  LASIX   hydrALAZINE 100 MG tablet Commonly known as:  APRESOLINE   insulin aspart 100 UNIT/ML injection Commonly known as:  novoLOG   ipratropium-albuterol 0.5-2.5 (3) MG/3ML Soln Commonly known as:  DUONEB   metoprolol tartrate 50 MG tablet Commonly known as:  LOPRESSOR   nitroGLYCERIN 0.4 MG SL tablet Commonly known as:  NITROSTAT   omega-3 acid ethyl esters 1 g capsule Commonly known as:  LOVAZA   omeprazole 40 MG capsule Commonly known as:  PRILOSEC   oxymetazoline 0.05 % nasal spray Commonly known as:  AFRIN   polyethylene glycol packet Commonly known as:  MIRALAX / GLYCOLAX   saccharomyces boulardii 250 MG capsule Commonly known as:  FLORASTOR   ULORIC 40 MG tablet Generic drug:  febuxostat   voriconazole 50 MG tablet Commonly known as:  VFEND       Vitals:   10/02/16 0000 10/02/16 1900  BP: 135/60   Pulse: (!) 56   Resp: 16   Temp:  97.7 F (36.5 C)    Skin clean, dry and intact without evidence of skin break down, no evidence of skin tears noted. IV catheter discontinued intact. Site without signs and symptoms of complications. Dressing and pressure applied. Pt denies pain at this time. No complaints  noted.  An After Visit Summary was printed and given to the patient. Patient escorted via Biomedical scientist, and D/C to [facility] for palliative care via Derrick Fry, Derrick Fry Phone [phone]

## 2016-10-03 NOTE — Progress Notes (Signed)
Discontinued morphine drip for transport to [facility]. Waste of 180cc in sink (remaining bag)  witnessed by [employer].

## 2016-10-04 ENCOUNTER — Ambulatory Visit: Payer: PPO | Admitting: Physician Assistant

## 2016-10-05 LAB — CULTURE, BLOOD (ROUTINE X 2)

## 2016-10-09 ENCOUNTER — Telehealth: Payer: Self-pay

## 2016-10-09 NOTE — Telephone Encounter (Signed)
Spoke with the patient's wife, condolences provided, she knows to call me if needed.

## 2016-10-09 NOTE — Telephone Encounter (Signed)
Pt passed away- August 1 at [facility].

## 2016-10-10 ENCOUNTER — Other Ambulatory Visit: Payer: Self-pay | Admitting: Internal Medicine

## 2016-10-17 LAB — FUNGUS CULTURE WITH STAIN

## 2016-10-17 LAB — FUNGUS CULTURE RESULT

## 2016-10-17 LAB — FUNGAL ORGANISM REFLEX

## 2016-10-23 ENCOUNTER — Ambulatory Visit: Payer: PPO | Admitting: Cardiovascular Disease

## 2016-10-25 ENCOUNTER — Inpatient Hospital Stay: Payer: PPO | Admitting: Internal Medicine

## 2016-11-04 DEATH — deceased

## 2018-06-19 IMAGING — CT CT HEAD W/O CM
3 of 4 series · 13 of 47 positions shown, 15 images · non-contrast
Comparison: CT head 09/18/2016.  MR head 09/18/2016.

CLINICAL DATA: Sepsis. Fungal brain abscess. Ongoing decline in
mental status, functional decline, febrile earlier today.

EXAM:
CT HEAD WITHOUT CONTRAST
TECHNIQUE: Contiguous axial images were obtained from the base of the skull
through the vertex without intravenous contrast.

[Series 3: head without · axial · non-contrast · 0.42mm/px · z∈[-73,+37]mm · 7 of 30 slices shown, 9 images]
[im 4/30  brain]
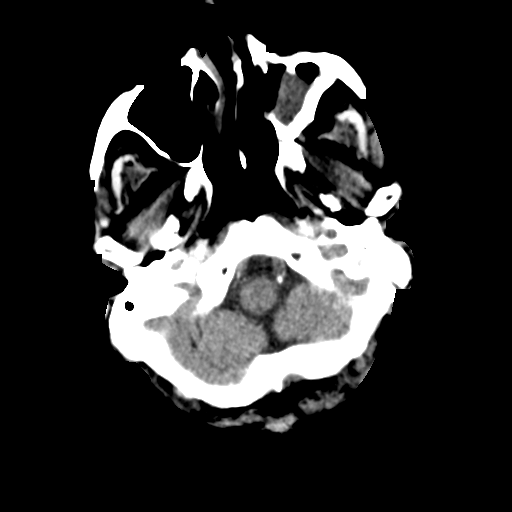
[im 4/30  bone]
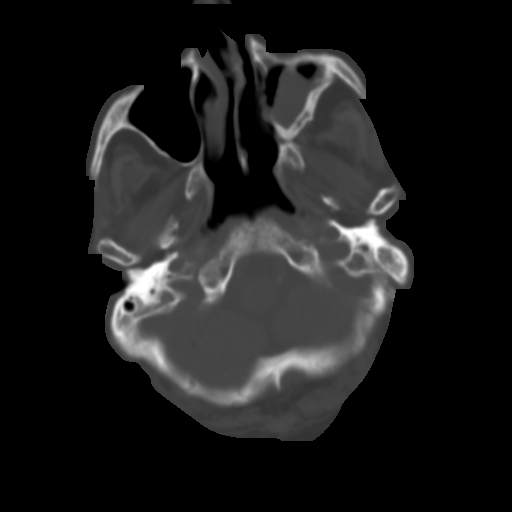
[im 8/30  brain]
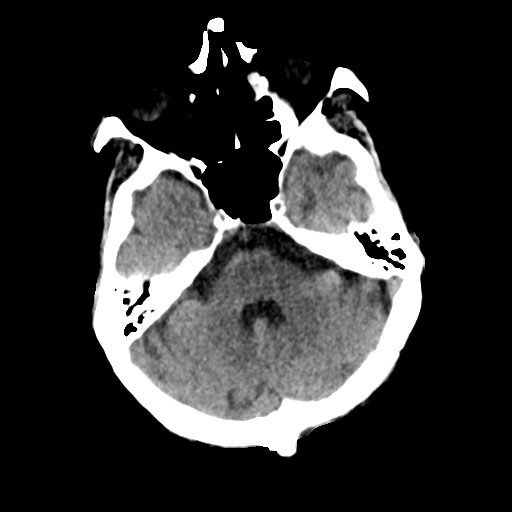
[im 11/30  brain]
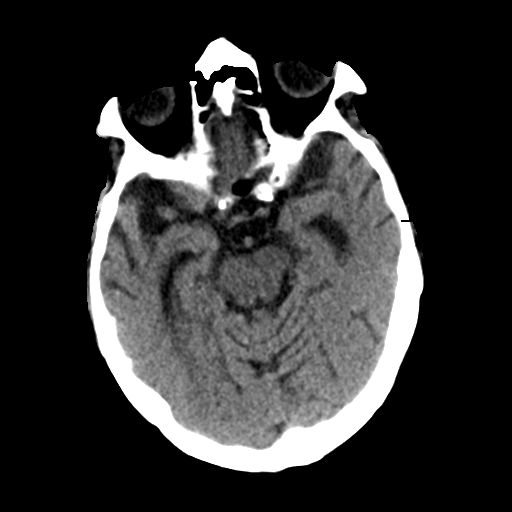
[im 15/30  brain]
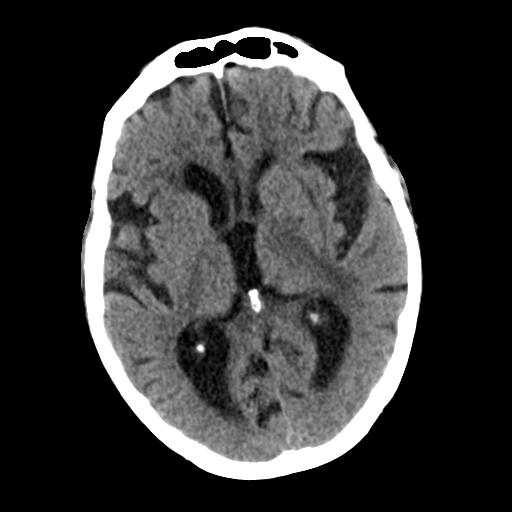
[im 19/30  brain]
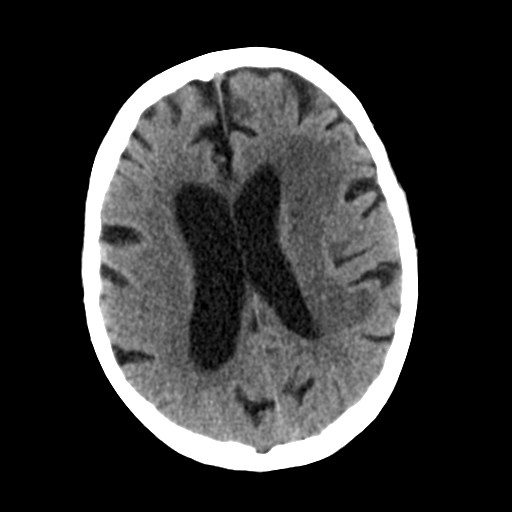
[im 19/30  bone]
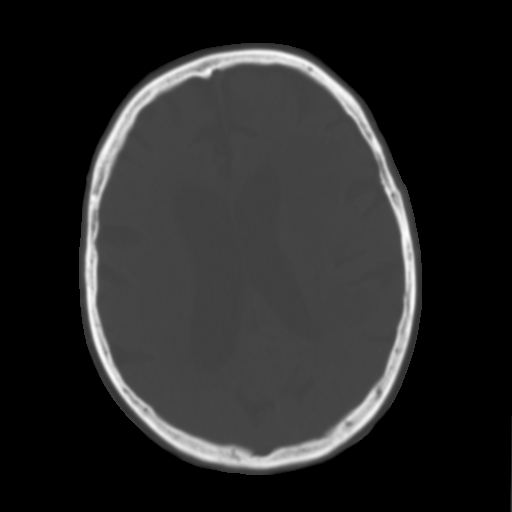
[im 22/30  brain]
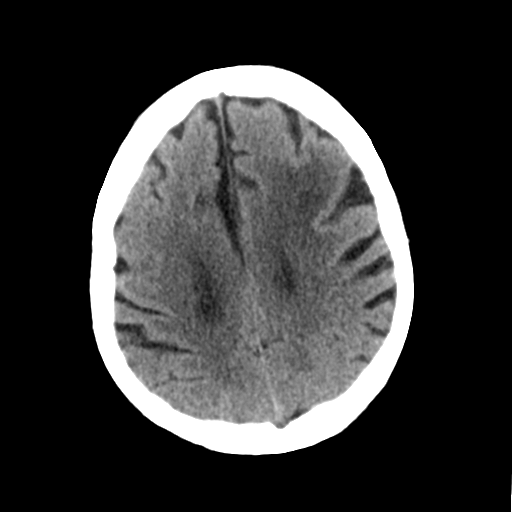
[im 26/30  brain]
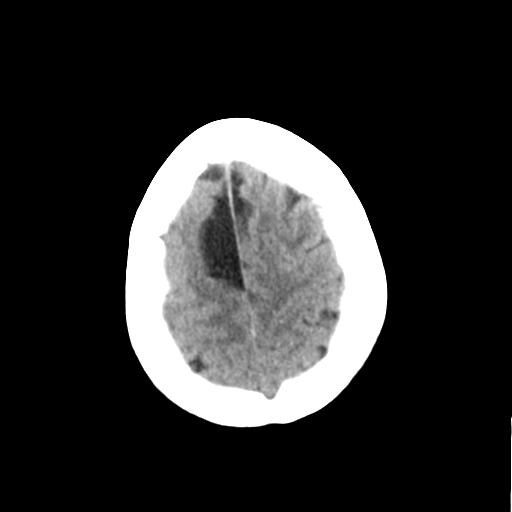

[Series 5: head without cor · coronal · non-contrast · 0.33mm/px · 3 of 67 slices shown]
[im 23/67  brain]
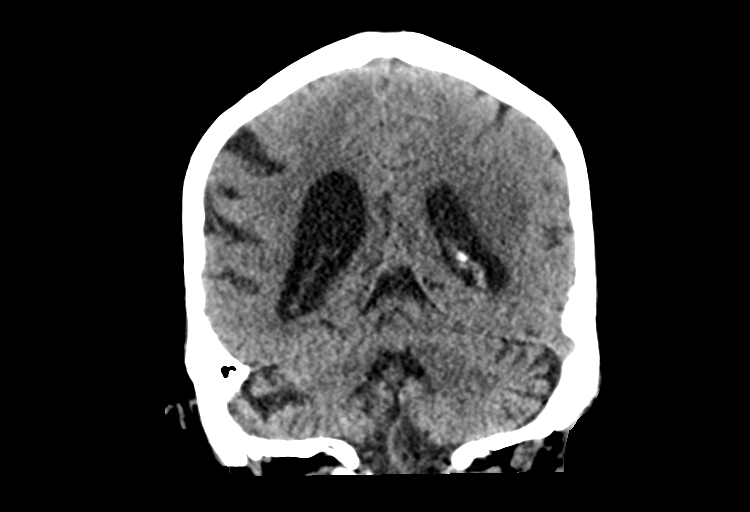
[im 30/67  brain]
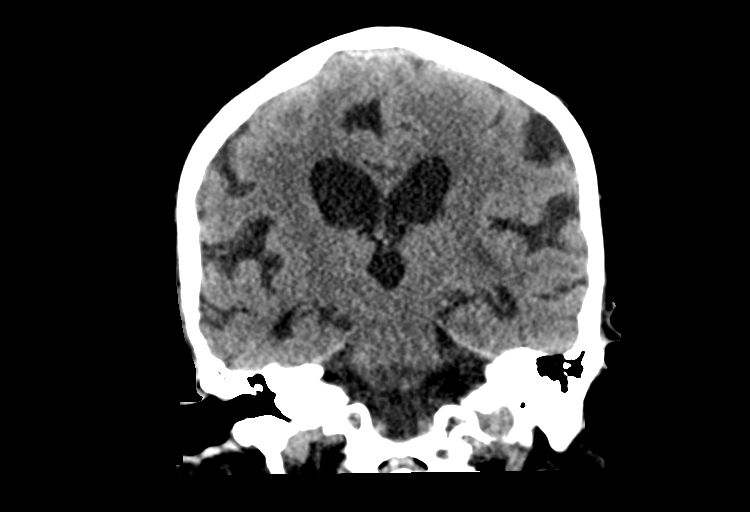
[im 37/67  brain]
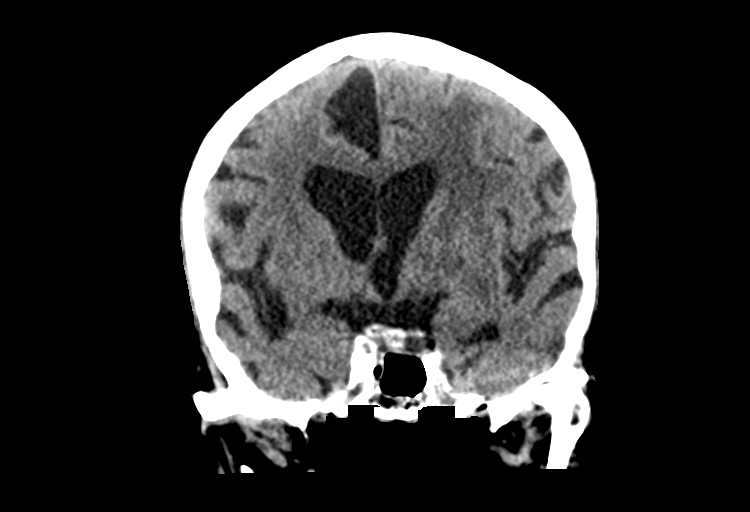

[Series 6: head without sag · sagittal · non-contrast · 0.32mm/px · 3 of 67 slices shown]
[im 23/67  brain]
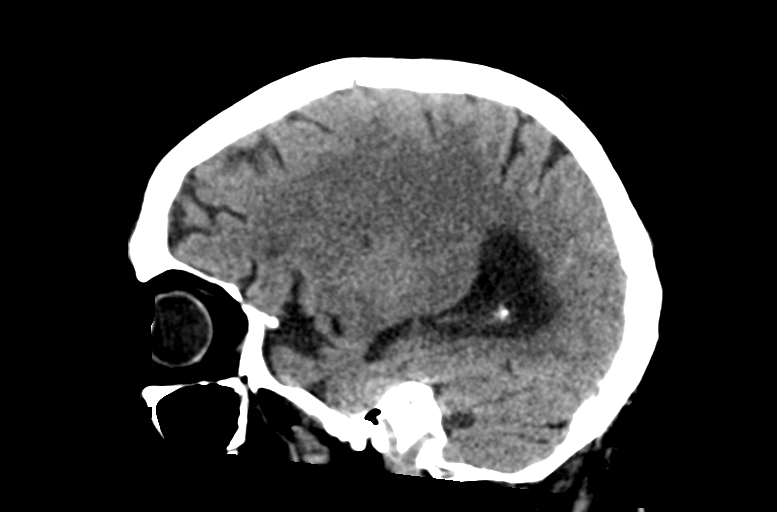
[im 34/67  brain]
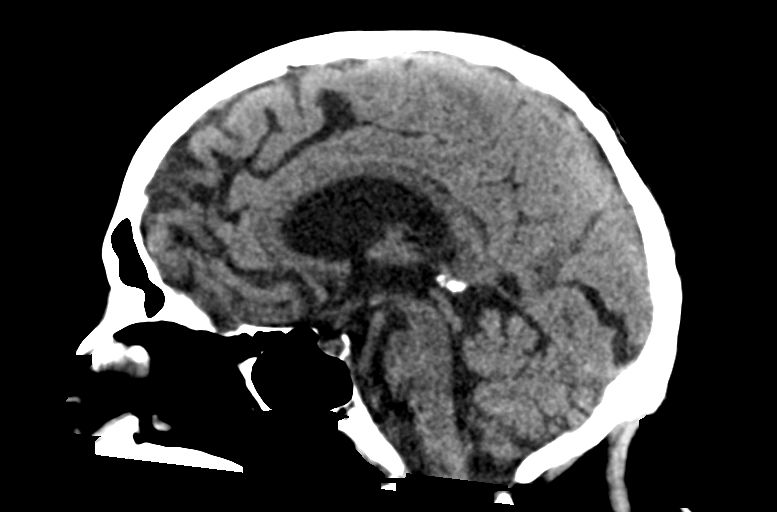
[im 45/67  brain]
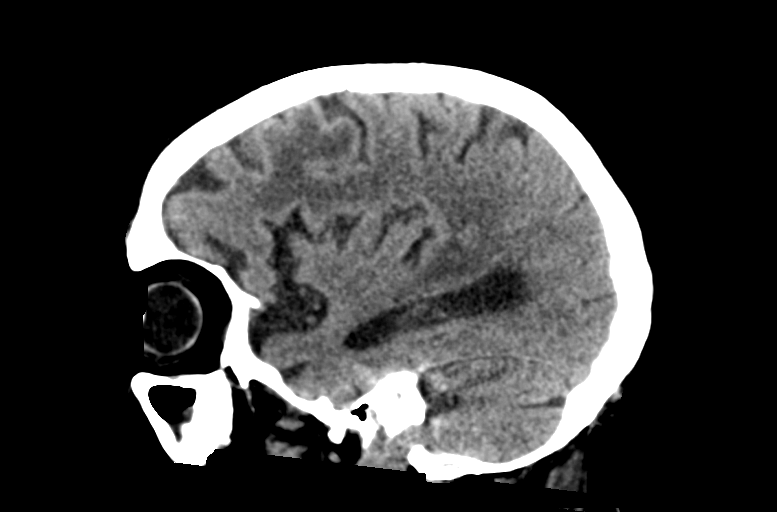

[13 of 47 positions shown; findings below may reference images not displayed]

FINDINGS: Brain: Global atrophy. Hydrocephalus ex vacuo. No brain hemorrhage
or extra-axial mass.

There is vasogenic edema affecting the LEFT frontal region, LEFT
basal ganglia, extending into the posterior LEFT temporal periatrial
region. These areas were all involved with low-level ring-like
enhancement representing multifocal brain abscesses as seen on most
recent prior MR.

Overall the mass effect and edema has regressed slightly in the
frontal region since prior CT. The edema and mass effect in the LEFT
temporal region appears worse.

Vascular: No hyperdense vessel or unexpected calcification.

Skull: Normal. Negative for fracture or focal lesion.

Sinuses/Orbits: Chronic mucosal thickening and reactive osteitis in
the LEFT maxillary sinus. No layering fluid. BILATERAL cataract
extraction.

Other: None.
IMPRESSION: Slight improvement in vasogenic edema affecting the LEFT frontal and
LEFT basal ganglia, areas involved with multifocal fungal brain
abscesses, as seen on most recent prior pre and postcontrast brain
MR.

There is slight worsening of edema in the LEFT temporal white
matter. If no contraindications, repeat of that study could provide
additional information.
